# Patient Record
Sex: Male | Born: 1940 | Race: White | Hispanic: No | Marital: Married | Smoking: Former smoker
Health system: Southern US, Community
[De-identification: ages and names within clinical notes are randomized; demographics above are authoritative.]

## PROBLEM LIST (undated history)

## (undated) DIAGNOSIS — H269 Unspecified cataract: Secondary | ICD-10-CM

## (undated) DIAGNOSIS — I739 Peripheral vascular disease, unspecified: Secondary | ICD-10-CM

## (undated) DIAGNOSIS — I2510 Atherosclerotic heart disease of native coronary artery without angina pectoris: Secondary | ICD-10-CM

## (undated) DIAGNOSIS — E7800 Pure hypercholesterolemia, unspecified: Secondary | ICD-10-CM

## (undated) DIAGNOSIS — E785 Hyperlipidemia, unspecified: Secondary | ICD-10-CM

## (undated) DIAGNOSIS — F419 Anxiety disorder, unspecified: Secondary | ICD-10-CM

## (undated) DIAGNOSIS — I420 Dilated cardiomyopathy: Secondary | ICD-10-CM

## (undated) DIAGNOSIS — I509 Heart failure, unspecified: Secondary | ICD-10-CM

## (undated) DIAGNOSIS — I10 Essential (primary) hypertension: Secondary | ICD-10-CM

## (undated) DIAGNOSIS — G4710 Hypersomnia, unspecified: Secondary | ICD-10-CM

## (undated) DIAGNOSIS — F32A Depression, unspecified: Secondary | ICD-10-CM

## (undated) DIAGNOSIS — J449 Chronic obstructive pulmonary disease, unspecified: Secondary | ICD-10-CM

## (undated) DIAGNOSIS — H3530 Unspecified macular degeneration: Secondary | ICD-10-CM

## (undated) DIAGNOSIS — F329 Major depressive disorder, single episode, unspecified: Secondary | ICD-10-CM

## (undated) DIAGNOSIS — N189 Chronic kidney disease, unspecified: Secondary | ICD-10-CM

## (undated) DIAGNOSIS — N200 Calculus of kidney: Secondary | ICD-10-CM

## (undated) DIAGNOSIS — I255 Ischemic cardiomyopathy: Secondary | ICD-10-CM

## (undated) DIAGNOSIS — E119 Type 2 diabetes mellitus without complications: Secondary | ICD-10-CM

## (undated) HISTORY — DX: Hyperlipidemia, unspecified: E78.5

## (undated) HISTORY — DX: Unspecified cataract: H26.9

## (undated) HISTORY — DX: Peripheral vascular disease, unspecified: I73.9

## (undated) HISTORY — DX: Pure hypercholesterolemia, unspecified: E78.00

## (undated) HISTORY — PX: CORONARY ARTERY BYPASS GRAFT: SHX141

## (undated) HISTORY — DX: Hypersomnia, unspecified: G47.10

## (undated) HISTORY — DX: Major depressive disorder, single episode, unspecified: F32.9

## (undated) HISTORY — DX: Dilated cardiomyopathy: I25.5

## (undated) HISTORY — DX: Calculus of kidney: N20.0

## (undated) HISTORY — PX: OTHER SURGICAL HISTORY: SHX169

## (undated) HISTORY — DX: Essential (primary) hypertension: I10

## (undated) HISTORY — DX: Type 2 diabetes mellitus without complications: E11.9

## (undated) HISTORY — DX: Atherosclerotic heart disease of native coronary artery without angina pectoris: I25.10

## (undated) HISTORY — DX: Dilated cardiomyopathy: I42.0

## (undated) HISTORY — DX: Chronic kidney disease, unspecified: N18.9

## (undated) HISTORY — DX: Depression, unspecified: F32.A

## (undated) HISTORY — DX: Anxiety disorder, unspecified: F41.9

---

## 2001-06-09 ENCOUNTER — Inpatient Hospital Stay (HOSPITAL_COMMUNITY): Admission: EM | Admit: 2001-06-09 | Discharge: 2001-06-16 | Payer: Self-pay | Admitting: Emergency Medicine

## 2001-06-11 ENCOUNTER — Encounter: Payer: Self-pay | Admitting: Thoracic Surgery (Cardiothoracic Vascular Surgery)

## 2001-06-12 ENCOUNTER — Encounter: Payer: Self-pay | Admitting: Thoracic Surgery (Cardiothoracic Vascular Surgery)

## 2001-06-15 ENCOUNTER — Encounter: Payer: Self-pay | Admitting: Thoracic Surgery (Cardiothoracic Vascular Surgery)

## 2001-07-21 ENCOUNTER — Encounter (HOSPITAL_COMMUNITY): Admission: RE | Admit: 2001-07-21 | Discharge: 2001-08-14 | Payer: Self-pay | Admitting: Cardiology

## 2012-12-29 ENCOUNTER — Encounter (INDEPENDENT_AMBULATORY_CARE_PROVIDER_SITE_OTHER): Payer: Medicare Other | Admitting: Ophthalmology

## 2012-12-29 DIAGNOSIS — I10 Essential (primary) hypertension: Secondary | ICD-10-CM

## 2012-12-29 DIAGNOSIS — H353290 Exudative age-related macular degeneration, unspecified eye, stage unspecified: Secondary | ICD-10-CM

## 2012-12-29 DIAGNOSIS — H3530 Unspecified macular degeneration: Secondary | ICD-10-CM

## 2012-12-29 DIAGNOSIS — H43819 Vitreous degeneration, unspecified eye: Secondary | ICD-10-CM

## 2013-01-11 ENCOUNTER — Encounter (INDEPENDENT_AMBULATORY_CARE_PROVIDER_SITE_OTHER): Payer: Medicare Other | Admitting: Ophthalmology

## 2013-01-11 DIAGNOSIS — H35039 Hypertensive retinopathy, unspecified eye: Secondary | ICD-10-CM

## 2013-01-11 DIAGNOSIS — H3530 Unspecified macular degeneration: Secondary | ICD-10-CM

## 2013-01-11 DIAGNOSIS — I10 Essential (primary) hypertension: Secondary | ICD-10-CM

## 2013-01-11 DIAGNOSIS — H353290 Exudative age-related macular degeneration, unspecified eye, stage unspecified: Secondary | ICD-10-CM

## 2013-01-25 ENCOUNTER — Encounter (INDEPENDENT_AMBULATORY_CARE_PROVIDER_SITE_OTHER): Payer: Medicare Other | Admitting: Ophthalmology

## 2013-01-25 DIAGNOSIS — H3530 Unspecified macular degeneration: Secondary | ICD-10-CM

## 2013-01-25 DIAGNOSIS — I10 Essential (primary) hypertension: Secondary | ICD-10-CM

## 2013-01-25 DIAGNOSIS — H35039 Hypertensive retinopathy, unspecified eye: Secondary | ICD-10-CM

## 2013-01-25 DIAGNOSIS — H43819 Vitreous degeneration, unspecified eye: Secondary | ICD-10-CM

## 2013-01-25 DIAGNOSIS — H353290 Exudative age-related macular degeneration, unspecified eye, stage unspecified: Secondary | ICD-10-CM

## 2013-03-01 ENCOUNTER — Encounter (INDEPENDENT_AMBULATORY_CARE_PROVIDER_SITE_OTHER): Payer: Medicare Other | Admitting: Ophthalmology

## 2013-03-01 DIAGNOSIS — H353290 Exudative age-related macular degeneration, unspecified eye, stage unspecified: Secondary | ICD-10-CM

## 2013-03-01 DIAGNOSIS — H35039 Hypertensive retinopathy, unspecified eye: Secondary | ICD-10-CM

## 2013-03-01 DIAGNOSIS — H43819 Vitreous degeneration, unspecified eye: Secondary | ICD-10-CM

## 2013-03-01 DIAGNOSIS — I10 Essential (primary) hypertension: Secondary | ICD-10-CM

## 2013-03-01 DIAGNOSIS — H3530 Unspecified macular degeneration: Secondary | ICD-10-CM

## 2013-04-14 ENCOUNTER — Encounter (INDEPENDENT_AMBULATORY_CARE_PROVIDER_SITE_OTHER): Payer: Medicare Other | Admitting: Ophthalmology

## 2013-04-14 DIAGNOSIS — H353290 Exudative age-related macular degeneration, unspecified eye, stage unspecified: Secondary | ICD-10-CM

## 2013-04-14 DIAGNOSIS — I10 Essential (primary) hypertension: Secondary | ICD-10-CM

## 2013-04-14 DIAGNOSIS — H3530 Unspecified macular degeneration: Secondary | ICD-10-CM

## 2013-04-14 DIAGNOSIS — H43819 Vitreous degeneration, unspecified eye: Secondary | ICD-10-CM

## 2013-04-14 DIAGNOSIS — H35039 Hypertensive retinopathy, unspecified eye: Secondary | ICD-10-CM

## 2013-05-25 ENCOUNTER — Encounter (INDEPENDENT_AMBULATORY_CARE_PROVIDER_SITE_OTHER): Payer: Medicare Other | Admitting: Ophthalmology

## 2013-05-25 DIAGNOSIS — I10 Essential (primary) hypertension: Secondary | ICD-10-CM

## 2013-05-25 DIAGNOSIS — H3530 Unspecified macular degeneration: Secondary | ICD-10-CM

## 2013-05-25 DIAGNOSIS — H35039 Hypertensive retinopathy, unspecified eye: Secondary | ICD-10-CM

## 2013-05-25 DIAGNOSIS — H353290 Exudative age-related macular degeneration, unspecified eye, stage unspecified: Secondary | ICD-10-CM

## 2013-05-25 DIAGNOSIS — H43819 Vitreous degeneration, unspecified eye: Secondary | ICD-10-CM

## 2013-06-15 ENCOUNTER — Other Ambulatory Visit (INDEPENDENT_AMBULATORY_CARE_PROVIDER_SITE_OTHER): Payer: Medicare Other | Admitting: Ophthalmology

## 2013-06-15 DIAGNOSIS — H26499 Other secondary cataract, unspecified eye: Secondary | ICD-10-CM

## 2013-07-06 ENCOUNTER — Encounter (INDEPENDENT_AMBULATORY_CARE_PROVIDER_SITE_OTHER): Payer: Medicare Other | Admitting: Ophthalmology

## 2013-08-03 ENCOUNTER — Encounter (INDEPENDENT_AMBULATORY_CARE_PROVIDER_SITE_OTHER): Payer: Medicare Other | Admitting: Ophthalmology

## 2013-08-03 DIAGNOSIS — I10 Essential (primary) hypertension: Secondary | ICD-10-CM

## 2013-08-03 DIAGNOSIS — H353290 Exudative age-related macular degeneration, unspecified eye, stage unspecified: Secondary | ICD-10-CM

## 2013-08-03 DIAGNOSIS — H35039 Hypertensive retinopathy, unspecified eye: Secondary | ICD-10-CM

## 2013-08-03 DIAGNOSIS — H43819 Vitreous degeneration, unspecified eye: Secondary | ICD-10-CM

## 2013-09-07 ENCOUNTER — Encounter (INDEPENDENT_AMBULATORY_CARE_PROVIDER_SITE_OTHER): Payer: Medicare Other | Admitting: Ophthalmology

## 2013-09-07 DIAGNOSIS — H353290 Exudative age-related macular degeneration, unspecified eye, stage unspecified: Secondary | ICD-10-CM

## 2013-09-07 DIAGNOSIS — I10 Essential (primary) hypertension: Secondary | ICD-10-CM

## 2013-09-07 DIAGNOSIS — H35039 Hypertensive retinopathy, unspecified eye: Secondary | ICD-10-CM

## 2013-09-07 DIAGNOSIS — H43819 Vitreous degeneration, unspecified eye: Secondary | ICD-10-CM

## 2013-10-13 ENCOUNTER — Other Ambulatory Visit: Payer: Self-pay | Admitting: Cardiology

## 2013-10-19 ENCOUNTER — Encounter (INDEPENDENT_AMBULATORY_CARE_PROVIDER_SITE_OTHER): Payer: Medicare Other | Admitting: Ophthalmology

## 2013-10-19 DIAGNOSIS — I10 Essential (primary) hypertension: Secondary | ICD-10-CM

## 2013-10-19 DIAGNOSIS — H43819 Vitreous degeneration, unspecified eye: Secondary | ICD-10-CM

## 2013-10-19 DIAGNOSIS — H353290 Exudative age-related macular degeneration, unspecified eye, stage unspecified: Secondary | ICD-10-CM

## 2013-10-19 DIAGNOSIS — H35039 Hypertensive retinopathy, unspecified eye: Secondary | ICD-10-CM

## 2013-10-23 ENCOUNTER — Encounter: Payer: Self-pay | Admitting: Cardiology

## 2013-10-23 ENCOUNTER — Encounter: Payer: Self-pay | Admitting: *Deleted

## 2013-10-23 DIAGNOSIS — F419 Anxiety disorder, unspecified: Secondary | ICD-10-CM | POA: Insufficient documentation

## 2013-10-23 DIAGNOSIS — I2510 Atherosclerotic heart disease of native coronary artery without angina pectoris: Secondary | ICD-10-CM | POA: Insufficient documentation

## 2013-10-23 DIAGNOSIS — E785 Hyperlipidemia, unspecified: Secondary | ICD-10-CM | POA: Insufficient documentation

## 2013-10-23 DIAGNOSIS — G4710 Hypersomnia, unspecified: Secondary | ICD-10-CM | POA: Insufficient documentation

## 2013-10-23 DIAGNOSIS — F329 Major depressive disorder, single episode, unspecified: Secondary | ICD-10-CM | POA: Insufficient documentation

## 2013-10-25 ENCOUNTER — Ambulatory Visit (INDEPENDENT_AMBULATORY_CARE_PROVIDER_SITE_OTHER): Payer: Medicare Other | Admitting: Cardiology

## 2013-10-25 ENCOUNTER — Encounter: Payer: Self-pay | Admitting: Cardiology

## 2013-10-25 VITALS — BP 139/80 | HR 92 | Wt 254.0 lb

## 2013-10-25 DIAGNOSIS — I739 Peripheral vascular disease, unspecified: Secondary | ICD-10-CM | POA: Insufficient documentation

## 2013-10-25 DIAGNOSIS — I2589 Other forms of chronic ischemic heart disease: Secondary | ICD-10-CM

## 2013-10-25 DIAGNOSIS — I2510 Atherosclerotic heart disease of native coronary artery without angina pectoris: Secondary | ICD-10-CM

## 2013-10-25 DIAGNOSIS — E785 Hyperlipidemia, unspecified: Secondary | ICD-10-CM

## 2013-10-25 DIAGNOSIS — I255 Ischemic cardiomyopathy: Secondary | ICD-10-CM | POA: Insufficient documentation

## 2013-10-25 DIAGNOSIS — N200 Calculus of kidney: Secondary | ICD-10-CM | POA: Insufficient documentation

## 2013-10-25 DIAGNOSIS — I10 Essential (primary) hypertension: Secondary | ICD-10-CM | POA: Insufficient documentation

## 2013-10-25 LAB — LDL CHOLESTEROL, DIRECT: Direct LDL: 169.5 mg/dL

## 2013-10-25 LAB — HEPATIC FUNCTION PANEL
ALT: 24 U/L (ref 0–53)
AST: 20 U/L (ref 0–37)
Albumin: 4 g/dL (ref 3.5–5.2)
Alkaline Phosphatase: 67 U/L (ref 39–117)
Bilirubin, Direct: 0 mg/dL (ref 0.0–0.3)
Total Bilirubin: 0.6 mg/dL (ref 0.3–1.2)
Total Protein: 7.1 g/dL (ref 6.0–8.3)

## 2013-10-25 LAB — LIPID PANEL
Cholesterol: 229 mg/dL — ABNORMAL HIGH (ref 0–200)
HDL: 52.6 mg/dL (ref >39.00)
Total CHOL/HDL Ratio: 4
Triglycerides: 115 mg/dL (ref 0.0–149.0)
VLDL: 23 mg/dL (ref 0.0–40.0)

## 2013-10-25 NOTE — Progress Notes (Signed)
[address], [address] [place], [place]  [address] Phone: [phone] Fax:  [phone]  Date:  10/25/2013   ID:  [name], DOB 02-Aug-1941, MRN [id number]  PCP:  No primary provider on file.  Cardiologist:  Armanda Magic, MD     History of Present Illness: [name] is a 72 y.o. male with a history of ASCAD/HTN/dyslipdiemia.  He is doing well.  He denies any chest pain, SOB at rest, LE edema, dizziness, palpitations or syncope.  He had a nuclear stress test a year ago due to SOB which showed no ischemia with prior infarct.  He has chronic DOE going up inclines but none when walking on flat ground.     Wt Readings from Last 3 Encounters:  10/25/13 254 lb (115.214 kg)     Past Medical History  Diagnosis Date  . Depression   . Anxiety   . Hypersomnia   . Dyslipidemia   . Coronary atherosclerosis of native coronary artery 2002    s/p MI with Ischemic CM EF 40% with apical akinesis at cath 2002 intolerant to ACE inhibitors and ARBs - EF 43% by nuclear stress test 08/2010 Dr. Mayford Knife   . CAD (coronary artery disease)      s/p CABG with LIMA to LAD, SVG to diag, SVG to OM, SVG to RCA cath 2002 Dr. Mayford Knife  . Ischemic dilated cardiomyopathy     EF 40% with apical AK at cath intolerant to ACE I and ARBS, EF 43% by nuclear stress test 08/2010  . Hypertension   . Hypercholesteremia   . Claudication     normal ABIs  . Cataract   . Chronic kidney disease   . Kidney stones     Current Outpatient Prescriptions  Medication Sig Dispense Refill  . amLODipine (NORVASC) 10 MG tablet TAKE 1 TABLET EVERY DAY  30 tablet  6  . escitalopram (LEXAPRO) 10 MG tablet Take 1 tablet by mouth daily.      . metoprolol succinate (TOPROL-XL) 25 MG 24 hr tablet Take 1 tablet by mouth daily.       No current facility-administered medications for this visit.    Allergies:   No Known Allergies  Social History:  The patient  reports that he quit smoking about 12 years ago. He does not have any  smokeless tobacco history on file. He reports that he drinks alcohol. He reports that he does not use illicit drugs.   Family History:  The patient's family history includes Breast cancer in his father and sister; CAD in his sister; CVA in his father; Diabetes in his brother, mother, sister, and sister; Heart disease in his brother and sister; Hypertension in his father, mother, sister, and sister.   ROS:  Please see the history of present illness.      All other systems reviewed and negative.   PHYSICAL EXAM: VS:  BP 139/80  Pulse 92  Wt 254 lb (115.214 kg) Well nourished, well developed, in no acute distress HEENT: normal Neck: no JVD Cardiac:  normal S1, S2; RRR; no murmur Lungs:  clear to auscultation bilaterally, no wheezing, rhonchi or rales Abd: soft, nontender, no hepatomegaly Ext: no edema Skin: warm and dry Neuro:  CNs 2-12 intact, no focal abnormalities noted  EKG:  NSR with nonspecific T wave abnormality with no change from EKG done 2013     ASSESSMENT AND PLAN:  1. ASCAD with no angina  - continue ASA 2. HTN  - continue  amlodipine/Metoprolol 3. Dyslipidemia - lipids from 02/2013 were not at goal of LDL<70.  His LDL was 98.  He just finished a drug study in May and is currently off statins.  - recheck fasting lipid panel 4. Ischemic DCM with no CHF on beta blockers.  He is intolerant to ACEI and ARBs  Followup with me in 6 months  Signed, Armanda Magic, MD 10/25/2013 11:30 AM

## 2013-10-25 NOTE — Patient Instructions (Signed)
Your physician recommends that you continue on your current medications as directed. Please refer to the Current Medication list given to you today.  Your physician recommends that you go to the lab today to have fasting Lipid and Hepatic panel drawn  Your physician wants you to follow-up in: 6 Months with Dr. Sherlyn Lick will receive a reminder letter in the mail two months in advance. If you don't receive a letter, please call our office to schedule the follow-up appointment.

## 2013-10-29 ENCOUNTER — Telehealth: Payer: Self-pay | Admitting: Pharmacist

## 2013-10-29 DIAGNOSIS — E785 Hyperlipidemia, unspecified: Secondary | ICD-10-CM

## 2013-10-29 DIAGNOSIS — Z79899 Other long term (current) drug therapy: Secondary | ICD-10-CM

## 2013-10-29 MED ORDER — SIMVASTATIN 20 MG PO TABS: 20.0000 mg | ORAL_TABLET | Freq: Every day | ORAL | Status: DC

## 2013-10-29 NOTE — Telephone Encounter (Signed)
RF:  CAD, HTN, age - LDL goal < 70 Meds:  Simvastatin 20 mg qhs - patient has been off simvastatin for past month he tells me as he ran out of refills.  He was tolerating it well though. Intolerant:  Lipitor (myalgias), Crestor (myalgais), simvastatin (mild myalgias).   Can't afford - Zetia. Patient was in a [employer] inhibitor study which finished 04/2013 of this year, and was to take simvastatin 20 mg daily since then.   His LDL was ~ 95 mg/dL in 12/6107, and LDL was also ~ 90-100 mg/dL in 6045 prior to enrolling into study.  It appears he was on placebo arm, and thus simvastatin 20 mg should be able to achieve LDL < 100 mg/dL on its own anyway.  He is on amlodipine as well, so limited to only simvastatin 20 mg due to interaction.  Given Zetia and Welchol will be too expensive will take only simvastatin 20 mg qd and recheck panel in 6 months.  If another study is enrolling in 6 months, could always consider enrolling into this if possible. Plan: 1.  Restart Simvastatin 20 mg qd. 2.  Recheck lipid and liver panel in 6 months (04/27/13) as he historically took this dose with no problems.  Patient notified and voices verbal understanding.

## 2013-11-16 ENCOUNTER — Encounter (INDEPENDENT_AMBULATORY_CARE_PROVIDER_SITE_OTHER): Payer: Medicare Other | Admitting: Ophthalmology

## 2013-11-16 DIAGNOSIS — H35039 Hypertensive retinopathy, unspecified eye: Secondary | ICD-10-CM

## 2013-11-16 DIAGNOSIS — H3530 Unspecified macular degeneration: Secondary | ICD-10-CM

## 2013-11-16 DIAGNOSIS — H43819 Vitreous degeneration, unspecified eye: Secondary | ICD-10-CM

## 2013-11-16 DIAGNOSIS — H353290 Exudative age-related macular degeneration, unspecified eye, stage unspecified: Secondary | ICD-10-CM

## 2013-11-16 DIAGNOSIS — I10 Essential (primary) hypertension: Secondary | ICD-10-CM

## 2013-12-14 ENCOUNTER — Encounter (INDEPENDENT_AMBULATORY_CARE_PROVIDER_SITE_OTHER): Payer: Medicare Other | Admitting: Ophthalmology

## 2013-12-14 DIAGNOSIS — H353290 Exudative age-related macular degeneration, unspecified eye, stage unspecified: Secondary | ICD-10-CM

## 2013-12-14 DIAGNOSIS — H35039 Hypertensive retinopathy, unspecified eye: Secondary | ICD-10-CM

## 2013-12-14 DIAGNOSIS — I10 Essential (primary) hypertension: Secondary | ICD-10-CM

## 2013-12-14 DIAGNOSIS — H3530 Unspecified macular degeneration: Secondary | ICD-10-CM

## 2013-12-14 DIAGNOSIS — H43819 Vitreous degeneration, unspecified eye: Secondary | ICD-10-CM

## 2014-01-21 ENCOUNTER — Other Ambulatory Visit: Payer: Self-pay

## 2014-01-21 MED ORDER — METOPROLOL SUCCINATE ER 25 MG PO TB24: 25.0000 mg | ORAL_TABLET | Freq: Every day | ORAL | Status: DC

## 2014-01-25 ENCOUNTER — Encounter (INDEPENDENT_AMBULATORY_CARE_PROVIDER_SITE_OTHER): Payer: Medicare Other | Admitting: Ophthalmology

## 2014-01-25 DIAGNOSIS — H353290 Exudative age-related macular degeneration, unspecified eye, stage unspecified: Secondary | ICD-10-CM

## 2014-01-25 DIAGNOSIS — H35039 Hypertensive retinopathy, unspecified eye: Secondary | ICD-10-CM

## 2014-01-25 DIAGNOSIS — H3530 Unspecified macular degeneration: Secondary | ICD-10-CM

## 2014-01-25 DIAGNOSIS — I10 Essential (primary) hypertension: Secondary | ICD-10-CM

## 2014-03-01 ENCOUNTER — Encounter (INDEPENDENT_AMBULATORY_CARE_PROVIDER_SITE_OTHER): Payer: Medicare Other | Admitting: Ophthalmology

## 2014-03-01 DIAGNOSIS — H353290 Exudative age-related macular degeneration, unspecified eye, stage unspecified: Secondary | ICD-10-CM

## 2014-03-01 DIAGNOSIS — I10 Essential (primary) hypertension: Secondary | ICD-10-CM

## 2014-03-01 DIAGNOSIS — H43819 Vitreous degeneration, unspecified eye: Secondary | ICD-10-CM

## 2014-03-01 DIAGNOSIS — H35039 Hypertensive retinopathy, unspecified eye: Secondary | ICD-10-CM

## 2014-04-05 ENCOUNTER — Encounter (INDEPENDENT_AMBULATORY_CARE_PROVIDER_SITE_OTHER): Payer: Medicare Other | Admitting: Ophthalmology

## 2014-04-05 DIAGNOSIS — H353290 Exudative age-related macular degeneration, unspecified eye, stage unspecified: Secondary | ICD-10-CM

## 2014-04-05 DIAGNOSIS — I10 Essential (primary) hypertension: Secondary | ICD-10-CM

## 2014-04-05 DIAGNOSIS — H43819 Vitreous degeneration, unspecified eye: Secondary | ICD-10-CM

## 2014-04-05 DIAGNOSIS — H35039 Hypertensive retinopathy, unspecified eye: Secondary | ICD-10-CM

## 2014-04-27 ENCOUNTER — Other Ambulatory Visit (INDEPENDENT_AMBULATORY_CARE_PROVIDER_SITE_OTHER): Payer: Medicare Other

## 2014-04-27 DIAGNOSIS — E785 Hyperlipidemia, unspecified: Secondary | ICD-10-CM

## 2014-04-27 DIAGNOSIS — Z79899 Other long term (current) drug therapy: Secondary | ICD-10-CM

## 2014-04-27 LAB — LIPID PANEL
Cholesterol: 178 mg/dL (ref 0–200)
HDL: 51.6 mg/dL (ref >39.00)
LDL Cholesterol: 109 mg/dL — ABNORMAL HIGH (ref 0–99)
Total CHOL/HDL Ratio: 3
Triglycerides: 85 mg/dL (ref 0.0–149.0)
VLDL: 17 mg/dL (ref 0.0–40.0)

## 2014-04-27 LAB — HEPATIC FUNCTION PANEL
ALT: 24 U/L (ref 0–53)
AST: 26 U/L (ref 0–37)
Albumin: 4.1 g/dL (ref 3.5–5.2)
Alkaline Phosphatase: 58 U/L (ref 39–117)
Bilirubin, Direct: 0.1 mg/dL (ref 0.0–0.3)
TOTAL PROTEIN: 6.9 g/dL (ref 6.0–8.3)
Total Bilirubin: 0.9 mg/dL (ref 0.2–1.2)

## 2014-04-28 ENCOUNTER — Encounter: Payer: Self-pay | Admitting: General Surgery

## 2014-04-28 ENCOUNTER — Other Ambulatory Visit: Payer: Self-pay | Admitting: General Surgery

## 2014-04-28 DIAGNOSIS — E785 Hyperlipidemia, unspecified: Secondary | ICD-10-CM

## 2014-05-03 ENCOUNTER — Encounter (INDEPENDENT_AMBULATORY_CARE_PROVIDER_SITE_OTHER): Payer: Medicare Other | Admitting: Ophthalmology

## 2014-05-03 DIAGNOSIS — I10 Essential (primary) hypertension: Secondary | ICD-10-CM

## 2014-05-03 DIAGNOSIS — H353290 Exudative age-related macular degeneration, unspecified eye, stage unspecified: Secondary | ICD-10-CM

## 2014-05-03 DIAGNOSIS — H35039 Hypertensive retinopathy, unspecified eye: Secondary | ICD-10-CM

## 2014-05-03 DIAGNOSIS — H43819 Vitreous degeneration, unspecified eye: Secondary | ICD-10-CM

## 2014-05-17 ENCOUNTER — Other Ambulatory Visit: Payer: Self-pay | Admitting: *Deleted

## 2014-05-17 MED ORDER — AMLODIPINE BESYLATE 10 MG PO TABS: ORAL_TABLET | ORAL | Status: DC

## 2014-05-31 ENCOUNTER — Encounter (INDEPENDENT_AMBULATORY_CARE_PROVIDER_SITE_OTHER): Payer: Medicare Other | Admitting: Ophthalmology

## 2014-05-31 DIAGNOSIS — I10 Essential (primary) hypertension: Secondary | ICD-10-CM

## 2014-05-31 DIAGNOSIS — H43819 Vitreous degeneration, unspecified eye: Secondary | ICD-10-CM

## 2014-05-31 DIAGNOSIS — H35039 Hypertensive retinopathy, unspecified eye: Secondary | ICD-10-CM

## 2014-05-31 DIAGNOSIS — H353290 Exudative age-related macular degeneration, unspecified eye, stage unspecified: Secondary | ICD-10-CM

## 2014-06-13 ENCOUNTER — Other Ambulatory Visit: Payer: Self-pay | Admitting: Cardiology

## 2014-06-28 ENCOUNTER — Encounter (INDEPENDENT_AMBULATORY_CARE_PROVIDER_SITE_OTHER): Payer: Medicare Other | Admitting: Ophthalmology

## 2014-06-28 DIAGNOSIS — H35039 Hypertensive retinopathy, unspecified eye: Secondary | ICD-10-CM

## 2014-06-28 DIAGNOSIS — H353290 Exudative age-related macular degeneration, unspecified eye, stage unspecified: Secondary | ICD-10-CM

## 2014-06-28 DIAGNOSIS — H43819 Vitreous degeneration, unspecified eye: Secondary | ICD-10-CM

## 2014-06-28 DIAGNOSIS — I10 Essential (primary) hypertension: Secondary | ICD-10-CM

## 2014-07-14 ENCOUNTER — Other Ambulatory Visit: Payer: Self-pay | Admitting: Cardiology

## 2014-07-27 ENCOUNTER — Encounter (INDEPENDENT_AMBULATORY_CARE_PROVIDER_SITE_OTHER): Payer: Medicare Other | Admitting: Ophthalmology

## 2014-07-27 DIAGNOSIS — H353290 Exudative age-related macular degeneration, unspecified eye, stage unspecified: Secondary | ICD-10-CM

## 2014-07-27 DIAGNOSIS — I10 Essential (primary) hypertension: Secondary | ICD-10-CM

## 2014-07-27 DIAGNOSIS — H43819 Vitreous degeneration, unspecified eye: Secondary | ICD-10-CM

## 2014-07-27 DIAGNOSIS — H35039 Hypertensive retinopathy, unspecified eye: Secondary | ICD-10-CM

## 2014-08-16 ENCOUNTER — Other Ambulatory Visit: Payer: Self-pay | Admitting: Cardiology

## 2014-08-23 ENCOUNTER — Encounter (INDEPENDENT_AMBULATORY_CARE_PROVIDER_SITE_OTHER): Payer: Medicare Other | Admitting: Ophthalmology

## 2014-08-23 DIAGNOSIS — I10 Essential (primary) hypertension: Secondary | ICD-10-CM

## 2014-08-23 DIAGNOSIS — H43819 Vitreous degeneration, unspecified eye: Secondary | ICD-10-CM

## 2014-08-23 DIAGNOSIS — H35039 Hypertensive retinopathy, unspecified eye: Secondary | ICD-10-CM

## 2014-08-23 DIAGNOSIS — H353290 Exudative age-related macular degeneration, unspecified eye, stage unspecified: Secondary | ICD-10-CM

## 2014-09-15 ENCOUNTER — Other Ambulatory Visit: Payer: Self-pay | Admitting: Cardiology

## 2014-09-16 ENCOUNTER — Ambulatory Visit
Admission: RE | Admit: 2014-09-16 | Discharge: 2014-09-16 | Disposition: A | Discharge status: Home / Self Care | Payer: Medicare Other | Source: Ambulatory Visit | Attending: Nurse Practitioner | Admitting: Nurse Practitioner

## 2014-09-16 ENCOUNTER — Other Ambulatory Visit: Payer: Self-pay | Admitting: Nurse Practitioner

## 2014-09-16 ENCOUNTER — Encounter (INDEPENDENT_AMBULATORY_CARE_PROVIDER_SITE_OTHER): Payer: Self-pay

## 2014-09-16 DIAGNOSIS — R0602 Shortness of breath: Secondary | ICD-10-CM

## 2014-09-20 ENCOUNTER — Encounter (INDEPENDENT_AMBULATORY_CARE_PROVIDER_SITE_OTHER): Payer: Medicare Other | Admitting: Ophthalmology

## 2014-09-20 DIAGNOSIS — H35033 Hypertensive retinopathy, bilateral: Secondary | ICD-10-CM

## 2014-09-20 DIAGNOSIS — H43813 Vitreous degeneration, bilateral: Secondary | ICD-10-CM

## 2014-09-20 DIAGNOSIS — H3532 Exudative age-related macular degeneration: Secondary | ICD-10-CM

## 2014-10-04 ENCOUNTER — Encounter: Payer: Medicare Other | Attending: Internal Medicine

## 2014-10-04 VITALS — Ht 67.5 in | Wt 253.0 lb

## 2014-10-04 DIAGNOSIS — E119 Type 2 diabetes mellitus without complications: Secondary | ICD-10-CM | POA: Insufficient documentation

## 2014-10-04 DIAGNOSIS — Z713 Dietary counseling and surveillance: Secondary | ICD-10-CM | POA: Diagnosis not present

## 2014-10-04 NOTE — Progress Notes (Signed)
Patient was seen on 10/04/14 for the first of a series of three diabetes self-management courses at the [facility].  Patient Education Plan per assessed needs and concerns is to attend four course education program for Diabetes Self Management Education.  The following learning objectives were met by the patient during this class:  Describe diabetes  State some common risk factors for diabetes  Defines the role of glucose and insulin  Identifies type of diabetes and pathophysiology  Describe the relationship between diabetes and cardiovascular risk  State the members of the Healthcare Team  States the rationale for glucose monitoring  State when to test glucose  State their individual Target Range  State the importance of logging glucose readings  Describe how to interpret glucose readings  Identifies A1C target  Explain the correlation between A1c and eAG values  State symptoms and treatment of high blood glucose  State symptoms and treatment of low blood glucose  Explain proper technique for glucose testing  Identifies proper sharps disposal  Handouts given during class include:  Living Well with Diabetes book  Carb Counting and Meal Planning book  Meal Plan Card  Carbohydrate guide  Meal planning worksheet  Low Sodium Flavoring Tips  The diabetes portion plate  E0F to eAG Conversion Chart  Diabetes Medications  Diabetes Recommended Care Schedule  Support Group  Diabetes Success Plan  Core Class Satisfaction Survey  Follow-Up Plan:  Attend core 2

## 2014-10-11 DIAGNOSIS — E119 Type 2 diabetes mellitus without complications: Secondary | ICD-10-CM | POA: Diagnosis not present

## 2014-10-12 ENCOUNTER — Other Ambulatory Visit: Payer: Self-pay | Admitting: Cardiology

## 2014-10-12 NOTE — Progress Notes (Signed)

## 2014-10-13 ENCOUNTER — Other Ambulatory Visit: Payer: Self-pay | Admitting: Cardiology

## 2014-10-18 ENCOUNTER — Encounter (INDEPENDENT_AMBULATORY_CARE_PROVIDER_SITE_OTHER): Payer: Medicare Other | Admitting: Ophthalmology

## 2014-10-18 ENCOUNTER — Encounter: Payer: Medicare Other | Attending: Internal Medicine

## 2014-10-18 DIAGNOSIS — H35033 Hypertensive retinopathy, bilateral: Secondary | ICD-10-CM

## 2014-10-18 DIAGNOSIS — Z713 Dietary counseling and surveillance: Secondary | ICD-10-CM | POA: Insufficient documentation

## 2014-10-18 DIAGNOSIS — E119 Type 2 diabetes mellitus without complications: Secondary | ICD-10-CM | POA: Insufficient documentation

## 2014-10-18 DIAGNOSIS — H43813 Vitreous degeneration, bilateral: Secondary | ICD-10-CM

## 2014-10-18 DIAGNOSIS — I10 Essential (primary) hypertension: Secondary | ICD-10-CM

## 2014-10-18 DIAGNOSIS — H3532 Exudative age-related macular degeneration: Secondary | ICD-10-CM

## 2014-10-18 NOTE — Progress Notes (Signed)
Patient was seen on 10/18/14 for the third of a series of three diabetes self-management courses at the [facility]. The following learning objectives were met by the patient during this class:  . State the amount of activity recommended for healthy living . Describe activities suitable for individual needs . Identify ways to regularly incorporate activity into daily life . Identify barriers to activity and ways to over come these barriers  Identify diabetes medications being personally used and their primary action for lowering glucose and possible side effects . Describe role of stress on blood glucose and develop strategies to address psychosocial issues . Identify diabetes complications and ways to prevent them  Explain how to manage diabetes during illness . Evaluate success in meeting personal goal . Establish 2-3 goals that they will plan to diligently work on until they return for the  85-monthfollow-up visit  Goals:   I will be active  5 times a week  Your patient has identified these potential barriers to change:  Time  Your patient has identified their diabetes self-care support plan as  Family Support On-line Resources Plan:  Attend Core 4 in 4 months

## 2014-10-28 ENCOUNTER — Other Ambulatory Visit: Payer: Medicare Other

## 2014-10-31 ENCOUNTER — Other Ambulatory Visit: Payer: Medicare Other

## 2014-11-01 ENCOUNTER — Ambulatory Visit: Payer: Medicare Other

## 2014-11-02 ENCOUNTER — Other Ambulatory Visit (INDEPENDENT_AMBULATORY_CARE_PROVIDER_SITE_OTHER): Payer: Medicare Other | Admitting: *Deleted

## 2014-11-02 DIAGNOSIS — E785 Hyperlipidemia, unspecified: Secondary | ICD-10-CM

## 2014-11-02 LAB — HEPATIC FUNCTION PANEL
ALK PHOS: 52 U/L (ref 39–117)
ALT: 20 U/L (ref 0–53)
AST: 19 U/L (ref 0–37)
Albumin: 4.2 g/dL (ref 3.5–5.2)
BILIRUBIN DIRECT: 0.1 mg/dL (ref 0.0–0.3)
Total Bilirubin: 0.8 mg/dL (ref 0.2–1.2)
Total Protein: 7.1 g/dL (ref 6.0–8.3)

## 2014-11-02 LAB — LIPID PANEL
CHOL/HDL RATIO: 4
Cholesterol: 227 mg/dL — ABNORMAL HIGH (ref 0–200)
HDL: 53.4 mg/dL (ref >39.00)
LDL Cholesterol: 160 mg/dL — ABNORMAL HIGH (ref 0–99)
NONHDL: 173.6
Triglycerides: 70 mg/dL (ref 0.0–149.0)
VLDL: 14 mg/dL (ref 0.0–40.0)

## 2014-11-10 ENCOUNTER — Other Ambulatory Visit: Payer: Self-pay | Admitting: Cardiology

## 2014-11-14 ENCOUNTER — Telehealth: Payer: Self-pay

## 2014-11-15 ENCOUNTER — Encounter (INDEPENDENT_AMBULATORY_CARE_PROVIDER_SITE_OTHER): Payer: Medicare Other | Admitting: Ophthalmology

## 2014-11-15 DIAGNOSIS — H3532 Exudative age-related macular degeneration: Secondary | ICD-10-CM

## 2014-11-15 DIAGNOSIS — H43813 Vitreous degeneration, bilateral: Secondary | ICD-10-CM

## 2014-11-15 DIAGNOSIS — I10 Essential (primary) hypertension: Secondary | ICD-10-CM

## 2014-11-15 DIAGNOSIS — E11329 Type 2 diabetes mellitus with mild nonproliferative diabetic retinopathy without macular edema: Secondary | ICD-10-CM

## 2014-11-15 DIAGNOSIS — E11319 Type 2 diabetes mellitus with unspecified diabetic retinopathy without macular edema: Secondary | ICD-10-CM | POA: Diagnosis not present

## 2014-11-15 DIAGNOSIS — H35033 Hypertensive retinopathy, bilateral: Secondary | ICD-10-CM | POA: Diagnosis not present

## 2014-11-15 NOTE — Telephone Encounter (Signed)
error 

## 2014-11-23 ENCOUNTER — Other Ambulatory Visit: Payer: Self-pay | Admitting: Cardiology

## 2014-12-20 ENCOUNTER — Encounter (INDEPENDENT_AMBULATORY_CARE_PROVIDER_SITE_OTHER): Payer: Medicare Other | Admitting: Ophthalmology

## 2014-12-20 ENCOUNTER — Other Ambulatory Visit: Payer: Self-pay | Admitting: Cardiology

## 2014-12-20 DIAGNOSIS — H35033 Hypertensive retinopathy, bilateral: Secondary | ICD-10-CM

## 2014-12-20 DIAGNOSIS — E11319 Type 2 diabetes mellitus with unspecified diabetic retinopathy without macular edema: Secondary | ICD-10-CM | POA: Diagnosis not present

## 2014-12-20 DIAGNOSIS — I10 Essential (primary) hypertension: Secondary | ICD-10-CM | POA: Diagnosis not present

## 2014-12-20 DIAGNOSIS — H3532 Exudative age-related macular degeneration: Secondary | ICD-10-CM | POA: Diagnosis not present

## 2014-12-20 DIAGNOSIS — E11329 Type 2 diabetes mellitus with mild nonproliferative diabetic retinopathy without macular edema: Secondary | ICD-10-CM

## 2014-12-20 DIAGNOSIS — H43813 Vitreous degeneration, bilateral: Secondary | ICD-10-CM

## 2014-12-28 ENCOUNTER — Ambulatory Visit (INDEPENDENT_AMBULATORY_CARE_PROVIDER_SITE_OTHER): Payer: Medicare Other | Admitting: Cardiology

## 2014-12-28 ENCOUNTER — Encounter: Payer: Self-pay | Admitting: Cardiology

## 2014-12-28 VITALS — BP 124/78 | HR 97 | Ht 67.5 in | Wt 250.0 lb

## 2014-12-28 DIAGNOSIS — I420 Dilated cardiomyopathy: Secondary | ICD-10-CM

## 2014-12-28 DIAGNOSIS — I255 Ischemic cardiomyopathy: Secondary | ICD-10-CM

## 2014-12-28 DIAGNOSIS — I10 Essential (primary) hypertension: Secondary | ICD-10-CM

## 2014-12-28 DIAGNOSIS — E785 Hyperlipidemia, unspecified: Secondary | ICD-10-CM

## 2014-12-28 DIAGNOSIS — I2510 Atherosclerotic heart disease of native coronary artery without angina pectoris: Secondary | ICD-10-CM

## 2014-12-28 NOTE — Patient Instructions (Addendum)
Your physician recommends that you return for fasting lab work on Feb. 16.  Your physician recommends that you continue on your current medications as directed. Please refer to the Current Medication list given to you today.  Your physician wants you to follow-up in: 1 year with Dr. Radford Pax. You will receive a reminder letter in the mail two months in advance. If you don't receive a letter, please call our office to schedule the follow-up appointment.

## 2014-12-28 NOTE — Progress Notes (Signed)
[place], [place], [place]  [address] Phone: [phone] Fax:  [phone]  Date:  12/28/2014   ID:  [name], DOB Jan 25, 1941, MRN [id number]  PCP:  Kandice Hams, MD  Cardiologist:  Fransico Him, MD    History of Present Illness: [name] is a 74 y.o. male with a history of ASCAD/HTN/dyslipdiemia. He is doing well. He denies any chest pain, SOB at rest, LE edema, dizziness, palpitations or syncope. He has chronic DOE going up inclines but none when walking on flat ground.   Wt Readings from Last 3 Encounters:  12/28/14 250 lb (113.399 kg)  10/04/14 253 lb (114.76 kg)  10/25/13 254 lb (115.214 kg)     Past Medical History  Diagnosis Date  . Depression   . Anxiety   . Hypersomnia   . Dyslipidemia   . Coronary atherosclerosis of native coronary artery 2002    s/p MI with Ischemic CM EF 40% with apical akinesis at cath 2002 intolerant to ACE inhibitors and ARBs - EF 47% by nuclear stress test 08/2010 Dr. Radford Pax   . CAD (coronary artery disease)      s/p CABG with LIMA to LAD, SVG to diag, SVG to OM, SVG to RCA cath 2002 Dr. Radford Pax  . Ischemic dilated cardiomyopathy     EF 40% with apical AK at cath intolerant to ACE I and ARBS, EF 43% by nuclear stress test 08/2010  . Hypercholesteremia   . Claudication     normal ABIs  . Cataract   . Chronic kidney disease   . Kidney stones   . Hypertension   . Diabetes mellitus without complication     Current Outpatient Prescriptions  Medication Sig Dispense Refill  . amLODipine (NORVASC) 10 MG tablet TAKE 1 TABLET BY MOUTH ONCE DAILY **NEEDS APPOINTMENT** 30 tablet 0  . aspirin EC 81 MG tablet Take 81 mg by mouth daily.    Marland Kitchen escitalopram (LEXAPRO) 10 MG tablet Take 1 tablet by mouth daily.    . metFORMIN (GLUCOPHAGE) 500 MG tablet Take by mouth 2 (two) times daily with a meal.    . metoprolol succinate (TOPROL-XL) 25 MG 24 hr tablet Take 1 tablet (25 mg total) by mouth daily. 30 tablet 6  . Multiple  Vitamin (MULTIVITAMIN) capsule Take 1 capsule by mouth daily.    Marland Kitchen SIMBRINZA 1-0.2 % SUSP   4  . simvastatin (ZOCOR) 20 MG tablet TAKE 1 TABLET (20 MG TOTAL) BY MOUTH AT BEDTIME. 30 tablet 0   No current facility-administered medications for this visit.    Allergies:   No Known Allergies  Social History:  The patient  reports that he quit smoking about 13 years ago. He does not have any smokeless tobacco history on file. He reports that he drinks alcohol. He reports that he does not use illicit drugs.   Family History:  The patient's family history includes Breast cancer in his father and sister; CAD in his sister; CVA in his father; Diabetes in his brother, mother, sister, and sister; Heart disease in his brother and sister; Hypertension in his father, mother, sister, and sister.   ROS:  Please see the history of present illness.      All other systems reviewed and negative.   PHYSICAL EXAM: VS:  BP 124/78 mmHg  Pulse 97  Ht 5' 7.5" (1.715 m)  Wt 250 lb (113.399 kg)  BMI 38.56 kg/m2 Well nourished, well developed, in no acute distress HEENT: normal  Neck: no JVD Cardiac:  normal S1, S2; RRR; no murmur Lungs:  clear to auscultation bilaterally, no wheezing, rhonchi or rales Abd: soft, nontender, no hepatomegaly Ext: no edema Skin: warm and dry Neuro:  CNs 2-12 intact, no focal abnormalities noted  EKG:   NSR with Lateral T wave abnormality  - no change from 2014  ASSESSMENT AND PLAN:  1. ASCAD with no angina - continue ASA 2. HTN - well controlled - continue amlodipine/Metoprolol   3.   Dyslipidemia - lipids from 10/2014 were not at goal of LDL<70. His LDL was 160but he had run out of his meds.   - recheck fasting lipid panel next month  - continue simvastatin 4. Ischemic DCM with no CHF on beta blockers. He is intolerant to ACEI and ARBs  Followup with me in 1 year  Signed, Fransico Him, MD [facility] 12/28/2014 4:07 PM

## 2014-12-29 ENCOUNTER — Other Ambulatory Visit: Payer: Self-pay | Admitting: Cardiology

## 2015-01-17 ENCOUNTER — Encounter (INDEPENDENT_AMBULATORY_CARE_PROVIDER_SITE_OTHER): Payer: Medicare Other | Admitting: Ophthalmology

## 2015-01-17 ENCOUNTER — Other Ambulatory Visit: Payer: Self-pay | Admitting: Cardiology

## 2015-01-17 DIAGNOSIS — H3532 Exudative age-related macular degeneration: Secondary | ICD-10-CM

## 2015-01-17 DIAGNOSIS — I10 Essential (primary) hypertension: Secondary | ICD-10-CM | POA: Diagnosis not present

## 2015-01-17 DIAGNOSIS — E11319 Type 2 diabetes mellitus with unspecified diabetic retinopathy without macular edema: Secondary | ICD-10-CM

## 2015-01-17 DIAGNOSIS — H43813 Vitreous degeneration, bilateral: Secondary | ICD-10-CM

## 2015-01-17 DIAGNOSIS — E11339 Type 2 diabetes mellitus with moderate nonproliferative diabetic retinopathy without macular edema: Secondary | ICD-10-CM

## 2015-01-17 DIAGNOSIS — H35033 Hypertensive retinopathy, bilateral: Secondary | ICD-10-CM | POA: Diagnosis not present

## 2015-01-17 DIAGNOSIS — E11329 Type 2 diabetes mellitus with mild nonproliferative diabetic retinopathy without macular edema: Secondary | ICD-10-CM

## 2015-01-31 ENCOUNTER — Other Ambulatory Visit: Payer: Medicare Other

## 2015-02-01 ENCOUNTER — Other Ambulatory Visit (INDEPENDENT_AMBULATORY_CARE_PROVIDER_SITE_OTHER): Payer: Medicare Other | Admitting: *Deleted

## 2015-02-01 DIAGNOSIS — E785 Hyperlipidemia, unspecified: Secondary | ICD-10-CM

## 2015-02-01 LAB — LIPID PANEL
Cholesterol: 156 mg/dL (ref 0–200)
HDL: 60.3 mg/dL (ref >39.00)
LDL Cholesterol: 82 mg/dL (ref 0–99)
NonHDL: 95.7
Total CHOL/HDL Ratio: 3
Triglycerides: 71 mg/dL (ref 0.0–149.0)
VLDL: 14.2 mg/dL (ref 0.0–40.0)

## 2015-02-01 LAB — ALT: ALT: 17 U/L (ref 0–53)

## 2015-02-01 NOTE — Addendum Note (Signed)
Addended by: Eulis Foster on: 02/01/2015 01:28 PM   Modules accepted: Orders

## 2015-02-03 ENCOUNTER — Other Ambulatory Visit: Payer: Self-pay | Admitting: Cardiology

## 2015-02-15 ENCOUNTER — Encounter (INDEPENDENT_AMBULATORY_CARE_PROVIDER_SITE_OTHER): Payer: Medicare Other | Admitting: Ophthalmology

## 2015-02-15 DIAGNOSIS — E1139 Type 2 diabetes mellitus with other diabetic ophthalmic complication: Secondary | ICD-10-CM | POA: Diagnosis not present

## 2015-02-15 DIAGNOSIS — I10 Essential (primary) hypertension: Secondary | ICD-10-CM

## 2015-02-15 DIAGNOSIS — H3532 Exudative age-related macular degeneration: Secondary | ICD-10-CM | POA: Diagnosis not present

## 2015-02-15 DIAGNOSIS — H43813 Vitreous degeneration, bilateral: Secondary | ICD-10-CM | POA: Diagnosis not present

## 2015-02-15 DIAGNOSIS — H35033 Hypertensive retinopathy, bilateral: Secondary | ICD-10-CM | POA: Diagnosis not present

## 2015-02-15 DIAGNOSIS — E11319 Type 2 diabetes mellitus with unspecified diabetic retinopathy without macular edema: Secondary | ICD-10-CM

## 2015-02-15 DIAGNOSIS — E11329 Type 2 diabetes mellitus with mild nonproliferative diabetic retinopathy without macular edema: Secondary | ICD-10-CM | POA: Diagnosis not present

## 2015-02-20 ENCOUNTER — Ambulatory Visit: Payer: Medicare Other

## 2015-02-21 ENCOUNTER — Other Ambulatory Visit: Payer: Self-pay | Admitting: Cardiology

## 2015-03-08 ENCOUNTER — Encounter (INDEPENDENT_AMBULATORY_CARE_PROVIDER_SITE_OTHER): Payer: Medicare Other | Admitting: Ophthalmology

## 2015-03-08 DIAGNOSIS — H35033 Hypertensive retinopathy, bilateral: Secondary | ICD-10-CM | POA: Diagnosis not present

## 2015-03-08 DIAGNOSIS — E11329 Type 2 diabetes mellitus with mild nonproliferative diabetic retinopathy without macular edema: Secondary | ICD-10-CM

## 2015-03-08 DIAGNOSIS — I10 Essential (primary) hypertension: Secondary | ICD-10-CM | POA: Diagnosis not present

## 2015-03-08 DIAGNOSIS — H43813 Vitreous degeneration, bilateral: Secondary | ICD-10-CM | POA: Diagnosis not present

## 2015-03-08 DIAGNOSIS — H3532 Exudative age-related macular degeneration: Secondary | ICD-10-CM | POA: Diagnosis not present

## 2015-03-08 DIAGNOSIS — E11319 Type 2 diabetes mellitus with unspecified diabetic retinopathy without macular edema: Secondary | ICD-10-CM | POA: Diagnosis not present

## 2015-04-05 ENCOUNTER — Encounter (INDEPENDENT_AMBULATORY_CARE_PROVIDER_SITE_OTHER): Payer: Medicare Other | Admitting: Ophthalmology

## 2015-04-05 DIAGNOSIS — E11319 Type 2 diabetes mellitus with unspecified diabetic retinopathy without macular edema: Secondary | ICD-10-CM

## 2015-04-05 DIAGNOSIS — I10 Essential (primary) hypertension: Secondary | ICD-10-CM

## 2015-04-05 DIAGNOSIS — H43813 Vitreous degeneration, bilateral: Secondary | ICD-10-CM

## 2015-04-05 DIAGNOSIS — H35033 Hypertensive retinopathy, bilateral: Secondary | ICD-10-CM

## 2015-04-05 DIAGNOSIS — E11329 Type 2 diabetes mellitus with mild nonproliferative diabetic retinopathy without macular edema: Secondary | ICD-10-CM

## 2015-04-05 DIAGNOSIS — H3532 Exudative age-related macular degeneration: Secondary | ICD-10-CM | POA: Diagnosis not present

## 2015-05-03 ENCOUNTER — Encounter (INDEPENDENT_AMBULATORY_CARE_PROVIDER_SITE_OTHER): Payer: Medicare Other | Admitting: Ophthalmology

## 2015-05-03 DIAGNOSIS — E11329 Type 2 diabetes mellitus with mild nonproliferative diabetic retinopathy without macular edema: Secondary | ICD-10-CM

## 2015-05-03 DIAGNOSIS — E11319 Type 2 diabetes mellitus with unspecified diabetic retinopathy without macular edema: Secondary | ICD-10-CM | POA: Diagnosis not present

## 2015-05-03 DIAGNOSIS — H43813 Vitreous degeneration, bilateral: Secondary | ICD-10-CM | POA: Diagnosis not present

## 2015-05-03 DIAGNOSIS — H35033 Hypertensive retinopathy, bilateral: Secondary | ICD-10-CM | POA: Diagnosis not present

## 2015-05-03 DIAGNOSIS — H3532 Exudative age-related macular degeneration: Secondary | ICD-10-CM | POA: Diagnosis not present

## 2015-05-03 DIAGNOSIS — I10 Essential (primary) hypertension: Secondary | ICD-10-CM

## 2015-05-31 ENCOUNTER — Encounter (INDEPENDENT_AMBULATORY_CARE_PROVIDER_SITE_OTHER): Payer: Medicare Other | Admitting: Ophthalmology

## 2015-05-31 DIAGNOSIS — E11329 Type 2 diabetes mellitus with mild nonproliferative diabetic retinopathy without macular edema: Secondary | ICD-10-CM

## 2015-05-31 DIAGNOSIS — I10 Essential (primary) hypertension: Secondary | ICD-10-CM

## 2015-05-31 DIAGNOSIS — H43813 Vitreous degeneration, bilateral: Secondary | ICD-10-CM | POA: Diagnosis not present

## 2015-05-31 DIAGNOSIS — H35033 Hypertensive retinopathy, bilateral: Secondary | ICD-10-CM | POA: Diagnosis not present

## 2015-05-31 DIAGNOSIS — E11319 Type 2 diabetes mellitus with unspecified diabetic retinopathy without macular edema: Secondary | ICD-10-CM | POA: Diagnosis not present

## 2015-05-31 DIAGNOSIS — H3532 Exudative age-related macular degeneration: Secondary | ICD-10-CM | POA: Diagnosis not present

## 2015-06-28 ENCOUNTER — Encounter (INDEPENDENT_AMBULATORY_CARE_PROVIDER_SITE_OTHER): Payer: Medicare Other | Admitting: Ophthalmology

## 2015-06-28 DIAGNOSIS — H43813 Vitreous degeneration, bilateral: Secondary | ICD-10-CM

## 2015-06-28 DIAGNOSIS — I10 Essential (primary) hypertension: Secondary | ICD-10-CM

## 2015-06-28 DIAGNOSIS — H35033 Hypertensive retinopathy, bilateral: Secondary | ICD-10-CM | POA: Diagnosis not present

## 2015-06-28 DIAGNOSIS — H3532 Exudative age-related macular degeneration: Secondary | ICD-10-CM

## 2015-06-28 DIAGNOSIS — H35371 Puckering of macula, right eye: Secondary | ICD-10-CM

## 2015-06-28 DIAGNOSIS — E11319 Type 2 diabetes mellitus with unspecified diabetic retinopathy without macular edema: Secondary | ICD-10-CM

## 2015-06-28 DIAGNOSIS — E11329 Type 2 diabetes mellitus with mild nonproliferative diabetic retinopathy without macular edema: Secondary | ICD-10-CM

## 2015-07-26 ENCOUNTER — Encounter (INDEPENDENT_AMBULATORY_CARE_PROVIDER_SITE_OTHER): Payer: Medicare Other | Admitting: Ophthalmology

## 2015-07-26 DIAGNOSIS — I10 Essential (primary) hypertension: Secondary | ICD-10-CM | POA: Diagnosis not present

## 2015-07-26 DIAGNOSIS — E11319 Type 2 diabetes mellitus with unspecified diabetic retinopathy without macular edema: Secondary | ICD-10-CM

## 2015-07-26 DIAGNOSIS — H43813 Vitreous degeneration, bilateral: Secondary | ICD-10-CM | POA: Diagnosis not present

## 2015-07-26 DIAGNOSIS — E11329 Type 2 diabetes mellitus with mild nonproliferative diabetic retinopathy without macular edema: Secondary | ICD-10-CM | POA: Diagnosis not present

## 2015-07-26 DIAGNOSIS — H35033 Hypertensive retinopathy, bilateral: Secondary | ICD-10-CM | POA: Diagnosis not present

## 2015-07-26 DIAGNOSIS — H3532 Exudative age-related macular degeneration: Secondary | ICD-10-CM | POA: Diagnosis not present

## 2015-08-23 ENCOUNTER — Encounter (INDEPENDENT_AMBULATORY_CARE_PROVIDER_SITE_OTHER): Payer: Medicare Other | Admitting: Ophthalmology

## 2015-08-23 DIAGNOSIS — E11319 Type 2 diabetes mellitus with unspecified diabetic retinopathy without macular edema: Secondary | ICD-10-CM | POA: Diagnosis not present

## 2015-08-23 DIAGNOSIS — H43813 Vitreous degeneration, bilateral: Secondary | ICD-10-CM

## 2015-08-23 DIAGNOSIS — E11329 Type 2 diabetes mellitus with mild nonproliferative diabetic retinopathy without macular edema: Secondary | ICD-10-CM

## 2015-08-23 DIAGNOSIS — H3532 Exudative age-related macular degeneration: Secondary | ICD-10-CM

## 2015-08-23 DIAGNOSIS — H35033 Hypertensive retinopathy, bilateral: Secondary | ICD-10-CM | POA: Diagnosis not present

## 2015-08-23 DIAGNOSIS — I10 Essential (primary) hypertension: Secondary | ICD-10-CM | POA: Diagnosis not present

## 2015-09-19 ENCOUNTER — Encounter (INDEPENDENT_AMBULATORY_CARE_PROVIDER_SITE_OTHER): Payer: Medicare Other | Admitting: Ophthalmology

## 2015-09-19 DIAGNOSIS — E113293 Type 2 diabetes mellitus with mild nonproliferative diabetic retinopathy without macular edema, bilateral: Secondary | ICD-10-CM

## 2015-09-19 DIAGNOSIS — H35033 Hypertensive retinopathy, bilateral: Secondary | ICD-10-CM

## 2015-09-19 DIAGNOSIS — I10 Essential (primary) hypertension: Secondary | ICD-10-CM | POA: Diagnosis not present

## 2015-09-19 DIAGNOSIS — H43813 Vitreous degeneration, bilateral: Secondary | ICD-10-CM | POA: Diagnosis not present

## 2015-09-19 DIAGNOSIS — E11319 Type 2 diabetes mellitus with unspecified diabetic retinopathy without macular edema: Secondary | ICD-10-CM

## 2015-09-19 DIAGNOSIS — H353231 Exudative age-related macular degeneration, bilateral, with active choroidal neovascularization: Secondary | ICD-10-CM | POA: Diagnosis not present

## 2015-10-17 ENCOUNTER — Encounter (INDEPENDENT_AMBULATORY_CARE_PROVIDER_SITE_OTHER): Payer: Medicare Other | Admitting: Ophthalmology

## 2015-10-17 DIAGNOSIS — I10 Essential (primary) hypertension: Secondary | ICD-10-CM

## 2015-10-17 DIAGNOSIS — E113291 Type 2 diabetes mellitus with mild nonproliferative diabetic retinopathy without macular edema, right eye: Secondary | ICD-10-CM

## 2015-10-17 DIAGNOSIS — E11311 Type 2 diabetes mellitus with unspecified diabetic retinopathy with macular edema: Secondary | ICD-10-CM

## 2015-10-17 DIAGNOSIS — H35033 Hypertensive retinopathy, bilateral: Secondary | ICD-10-CM

## 2015-10-17 DIAGNOSIS — E113313 Type 2 diabetes mellitus with moderate nonproliferative diabetic retinopathy with macular edema, bilateral: Secondary | ICD-10-CM | POA: Diagnosis not present

## 2015-10-17 DIAGNOSIS — H353231 Exudative age-related macular degeneration, bilateral, with active choroidal neovascularization: Secondary | ICD-10-CM

## 2015-10-17 DIAGNOSIS — H43813 Vitreous degeneration, bilateral: Secondary | ICD-10-CM

## 2015-10-18 ENCOUNTER — Encounter (INDEPENDENT_AMBULATORY_CARE_PROVIDER_SITE_OTHER): Payer: Medicare Other | Admitting: Ophthalmology

## 2015-11-15 ENCOUNTER — Encounter (INDEPENDENT_AMBULATORY_CARE_PROVIDER_SITE_OTHER): Payer: Medicare Other | Admitting: Ophthalmology

## 2015-11-15 DIAGNOSIS — E113213 Type 2 diabetes mellitus with mild nonproliferative diabetic retinopathy with macular edema, bilateral: Secondary | ICD-10-CM

## 2015-11-15 DIAGNOSIS — I10 Essential (primary) hypertension: Secondary | ICD-10-CM | POA: Diagnosis not present

## 2015-11-15 DIAGNOSIS — H35033 Hypertensive retinopathy, bilateral: Secondary | ICD-10-CM

## 2015-11-15 DIAGNOSIS — H353231 Exudative age-related macular degeneration, bilateral, with active choroidal neovascularization: Secondary | ICD-10-CM | POA: Diagnosis not present

## 2015-11-15 DIAGNOSIS — E11311 Type 2 diabetes mellitus with unspecified diabetic retinopathy with macular edema: Secondary | ICD-10-CM

## 2015-11-15 DIAGNOSIS — H43813 Vitreous degeneration, bilateral: Secondary | ICD-10-CM | POA: Diagnosis not present

## 2015-12-20 ENCOUNTER — Encounter (INDEPENDENT_AMBULATORY_CARE_PROVIDER_SITE_OTHER): Payer: Medicare Other | Admitting: Ophthalmology

## 2015-12-20 DIAGNOSIS — I10 Essential (primary) hypertension: Secondary | ICD-10-CM | POA: Diagnosis not present

## 2015-12-20 DIAGNOSIS — H43813 Vitreous degeneration, bilateral: Secondary | ICD-10-CM | POA: Diagnosis not present

## 2015-12-20 DIAGNOSIS — H353231 Exudative age-related macular degeneration, bilateral, with active choroidal neovascularization: Secondary | ICD-10-CM | POA: Diagnosis not present

## 2015-12-20 DIAGNOSIS — E11311 Type 2 diabetes mellitus with unspecified diabetic retinopathy with macular edema: Secondary | ICD-10-CM

## 2015-12-20 DIAGNOSIS — H35033 Hypertensive retinopathy, bilateral: Secondary | ICD-10-CM | POA: Diagnosis not present

## 2015-12-20 DIAGNOSIS — E113213 Type 2 diabetes mellitus with mild nonproliferative diabetic retinopathy with macular edema, bilateral: Secondary | ICD-10-CM

## 2016-01-04 ENCOUNTER — Other Ambulatory Visit: Payer: Self-pay | Admitting: Cardiology

## 2016-01-09 ENCOUNTER — Other Ambulatory Visit: Payer: Self-pay | Admitting: Cardiology

## 2016-01-09 NOTE — Telephone Encounter (Signed)
Medication Detail      Disp Refills Start End     metoprolol succinate (TOPROL-XL) 25 MG 24 hr tablet 30 tablet 0 01/04/2016     Sig: TAKE 1 TABLET BY MOUTH EVERY DAY    Notes to Pharmacy: Please call and schedule a one year follow up appointment    E-Prescribing Status: Receipt confirmed by pharmacy (01/04/2016 1:14 PM EST)     Pharmacy    [id number] - [place], [place]

## 2016-01-10 ENCOUNTER — Telehealth: Payer: Self-pay

## 2016-01-10 NOTE — Telephone Encounter (Signed)
Patient called in to ask why his refill for Metoprolol was denied. He stated he was completely out of medication and that [facility] said they did not have a refill for him to pick up because we had refused his refill request. I explained he needs to schedule an office visit to keep receiving refills. Then I placed pt on hold and called [facility] and verified they did have refill of Metoprolol sent in on 01/04/2016 ready and waiting for the patient to pick up. I explained to patient that metoprolol was ready for him to pick up and asked if he would like to be transferred to scheduling to make his follow up appointment. He stated he was unhappy with Dr Radford Pax since she has moved here from [facility] and that he would call back to schedule appointment. I made sure he had our correct phone number.

## 2016-01-17 ENCOUNTER — Encounter (INDEPENDENT_AMBULATORY_CARE_PROVIDER_SITE_OTHER): Payer: Medicare Other | Admitting: Ophthalmology

## 2016-01-17 DIAGNOSIS — E11311 Type 2 diabetes mellitus with unspecified diabetic retinopathy with macular edema: Secondary | ICD-10-CM

## 2016-01-17 DIAGNOSIS — E113213 Type 2 diabetes mellitus with mild nonproliferative diabetic retinopathy with macular edema, bilateral: Secondary | ICD-10-CM

## 2016-01-17 DIAGNOSIS — H43813 Vitreous degeneration, bilateral: Secondary | ICD-10-CM | POA: Diagnosis not present

## 2016-01-17 DIAGNOSIS — H35033 Hypertensive retinopathy, bilateral: Secondary | ICD-10-CM | POA: Diagnosis not present

## 2016-01-17 DIAGNOSIS — H353231 Exudative age-related macular degeneration, bilateral, with active choroidal neovascularization: Secondary | ICD-10-CM

## 2016-01-17 DIAGNOSIS — I10 Essential (primary) hypertension: Secondary | ICD-10-CM

## 2016-01-26 ENCOUNTER — Other Ambulatory Visit: Payer: Self-pay | Admitting: Cardiology

## 2016-02-06 ENCOUNTER — Other Ambulatory Visit: Payer: Self-pay | Admitting: Cardiology

## 2016-02-14 ENCOUNTER — Encounter (INDEPENDENT_AMBULATORY_CARE_PROVIDER_SITE_OTHER): Payer: Medicare Other | Admitting: Ophthalmology

## 2016-02-14 DIAGNOSIS — I10 Essential (primary) hypertension: Secondary | ICD-10-CM | POA: Diagnosis not present

## 2016-02-14 DIAGNOSIS — H43813 Vitreous degeneration, bilateral: Secondary | ICD-10-CM

## 2016-02-14 DIAGNOSIS — E113213 Type 2 diabetes mellitus with mild nonproliferative diabetic retinopathy with macular edema, bilateral: Secondary | ICD-10-CM

## 2016-02-14 DIAGNOSIS — E11311 Type 2 diabetes mellitus with unspecified diabetic retinopathy with macular edema: Secondary | ICD-10-CM

## 2016-02-14 DIAGNOSIS — H35033 Hypertensive retinopathy, bilateral: Secondary | ICD-10-CM | POA: Diagnosis not present

## 2016-02-14 DIAGNOSIS — H353231 Exudative age-related macular degeneration, bilateral, with active choroidal neovascularization: Secondary | ICD-10-CM

## 2016-02-20 ENCOUNTER — Other Ambulatory Visit: Payer: Self-pay | Admitting: Cardiology

## 2016-02-26 NOTE — Progress Notes (Signed)
Cardiology Office Note:    Date:  02/27/2016   ID:  [name], DOB 28-Apr-1941, MRN [id number]  PCP:  Kandice Hams, MD  Cardiologist:  Dr. Fransico Him   Electrophysiologist:  n/a  Chief Complaint  Patient presents with  . Coronary Artery Disease    Follow up    History of Present Illness:     [name] is a 75 y.o. male with a hx of CAD status post CABG in 2002, ischemic cardiomyopathy, HTN, HL, diabetes, COPD, CKD. Last seen by Dr. Radford Pax 1/16. Returns for follow-up.  The patient denies chest pain, significant shortness of breath, syncope, orthopnea, PND or significant pedal edema. He has COPD and his SOB has actually gotten better. He has cut back on salt and has lost 13 lbs since last seen.   Past Medical History  Diagnosis Date  . Depression   . Anxiety   . Hypersomnia   . Dyslipidemia   . Coronary atherosclerosis of native coronary artery 2002    s/p MI with Ischemic CM EF 40% with apical akinesis at cath 2002 intolerant to ACE inhibitors and ARBs - EF 47% by nuclear stress test 08/2010 Dr. Radford Pax   . CAD (coronary artery disease)      s/p CABG with LIMA to LAD, SVG to diag, SVG to OM, SVG to RCA cath 2002 Dr. Radford Pax  . Ischemic dilated cardiomyopathy     EF 40% with apical AK at cath intolerant to ACE I and ARBS, EF 43% by nuclear stress test 08/2010  . Hypercholesteremia   . Claudication ([place])     normal ABIs  . Cataract   . Chronic kidney disease   . Kidney stones   . Hypertension   . Diabetes mellitus without complication [facility])     Past Surgical History  Procedure Laterality Date  . Coronary artery bypass graft      with LIMA to LAD, SVG to diag, SVG to OM, SVG to RCA cath 2002 Dr. Radford Pax  . Urologic procedure for fertility      Current Medications: Outpatient Prescriptions Prior to Visit  Medication Sig Dispense Refill  . aspirin EC 81 MG tablet Take 81 mg by mouth daily.    Marland Kitchen escitalopram (LEXAPRO) 10 MG tablet Take 1 tablet by mouth daily.     . metFORMIN (GLUCOPHAGE) 500 MG tablet Take by mouth 2 (two) times daily with a meal.    . Multiple Vitamin (MULTIVITAMIN) capsule Take 1 capsule by mouth daily.    Marland Kitchen SIMBRINZA 1-0.2 % SUSP Place 1 drop into the left eye 2 (two) times daily.   4  . amLODipine (NORVASC) 10 MG tablet TAKE 1 TABLET BY MOUTH ONCE DAILY **NEEDS APPOINTMENT** 30 tablet 1  . metoprolol succinate (TOPROL-XL) 25 MG 24 hr tablet TAKE 1 TABLET BY MOUTH EVERY DAY 30 tablet 1  . simvastatin (ZOCOR) 20 MG tablet TAKE 1 TABLET (20 MG TOTAL) BY MOUTH AT BEDTIME. 30 tablet 0  . simvastatin (ZOCOR) 20 MG tablet TAKE 1 TABLET (20 MG TOTAL) BY MOUTH AT BEDTIME. 15 tablet 0   No facility-administered medications prior to visit.     Allergies:   Review of patient's allergies indicates no known allergies.   Social History   Social History  . Marital Status: Married    Spouse Name: N/A  . Number of Children: N/A  . Years of Education: N/A   Social History Main Topics  . Smoking status: Former Audiological scientist  date: 10/25/2001  . Smokeless tobacco: None  . Alcohol Use: Yes     Comment: occasional beer and wine  . Drug Use: No  . Sexual Activity: Not Asked   Other Topics Concern  . None   Social History Narrative     Family History:  The patient's family history includes Breast cancer in his father and sister; CAD in his sister; CVA in his father; Diabetes in his brother, mother, sister, and sister; Heart disease in his brother and sister; Hypertension in his father, mother, sister, and sister.   ROS:   Please see the history of present illness.    Review of Systems  HENT: Positive for headaches.   Eyes: Positive for visual disturbance.  Cardiovascular: Positive for dyspnea on exertion and leg swelling.  Hematologic/Lymphatic: Bruises/bleeds easily.  All other systems reviewed and are negative.   Physical Exam:    VS:  BP 120/60 mmHg  Pulse 78  Ht 5\' 7"  (1.702 m)  Wt 237 lb 12.8 oz (107.865 kg)  BMI  37.24 kg/m2   GEN: Well nourished, well developed, in no acute distress HEENT: normal Neck: no JVD, no masses Cardiac: Normal S1/S2,  RRR; no murmurs, rubs, or gallops, trace RLE edema;  no carotid bruits,   Respiratory:  clear to auscultation bilaterally; no wheezing, rhonchi or rales GI: soft, nontender, nondistended MS: no deformity or atrophy Skin: warm and dry Neuro: No focal deficits  Psych: Alert and oriented x 3, normal affect  Wt Readings from Last 3 Encounters:  02/27/16 237 lb 12.8 oz (107.865 kg)  12/28/14 250 lb (113.399 kg)  10/04/14 253 lb (114.76 kg)      Studies/Labs Reviewed:     EKG:  EKG is  ordered today.  The ekg ordered today demonstrates NSR, HR 78, LAD, ant Q waves, QTc 449 ms, no change since last tracing   Recent Labs: No results found for requested labs within last 365 days.   Recent Lipid Panel    Component Value Date/Time   CHOL 156 02/01/2015 1328   TRIG 71.0 02/01/2015 1328   HDL 60.30 02/01/2015 1328   CHOLHDL 3 02/01/2015 1328   VLDL 14.2 02/01/2015 1328   LDLCALC 82 02/01/2015 1328   LDLDIRECT 169.5 10/25/2013 1201    Additional studies/ records that were reviewed today include:   Myoview 10/13 Inferior, apical scar, no ischemia, EF 43%, low risk  ASSESSMENT:     1. Coronary artery disease involving native coronary artery of native heart without angina pectoris   2. Ischemic dilated cardiomyopathy   3. Essential hypertension   4. Dyslipidemia     PLAN:     In order of problems listed above:  1. CAD - s/p CABG.  Last nuc study in 2013 low risk.  He is doing well without angina. Continue aspirin, beta blocker, statin.  2. Ischemic CM - EF 43% by recent Nuc study.  No prior hx of CHF symptoms and he is intol to ACE/ARB.  He is NYHA 2.  3. HTN - Blood pressure is controlled. Continue current regimen. Arrange follow-up CMET.  4. HL - Continue statin. LDL in 4/16 was 85. Arrange follow-up lipids and LFTs in  03/2016.    Medication Adjustments/Labs and Tests Ordered: Current medicines are reviewed at length with the patient today.  Concerns regarding medicines are outlined above.  Medication changes, Labs and Tests ordered today are outlined in the Patient Instructions noted below. Patient Instructions  Medication Instructions:  No changes.  See your medication list. I sent in refills for your Simvastatin, Toprol-XL, Amlodipine.   Labwork: Return sometime in April 2017 to get fasting labs: CMET, Lipids  Testing/Procedures: None   Follow-Up: Your physician wants you to follow-up in: 1 year with Dr. Mallie Snooks will receive a reminder letter in the mail two months in advance. If you don't receive a letter, please call our office to schedule the follow-up appointment.  Any Other Special Instructions Will Be Listed Below (If Applicable).  If you need a refill on your cardiac medications before your next appointment, please call your pharmacy.    Signed, Richardson Dopp, PA-C  02/27/2016 11:15 AM    [address], [place], [place]  [address] Phone: [phone]; Fax: [phone]

## 2016-02-27 ENCOUNTER — Encounter: Payer: Self-pay | Admitting: Physician Assistant

## 2016-02-27 ENCOUNTER — Ambulatory Visit (INDEPENDENT_AMBULATORY_CARE_PROVIDER_SITE_OTHER): Payer: Medicare Other | Admitting: Physician Assistant

## 2016-02-27 VITALS — BP 120/60 | HR 78 | Ht 67.0 in | Wt 237.8 lb

## 2016-02-27 DIAGNOSIS — I2510 Atherosclerotic heart disease of native coronary artery without angina pectoris: Secondary | ICD-10-CM

## 2016-02-27 DIAGNOSIS — E785 Hyperlipidemia, unspecified: Secondary | ICD-10-CM | POA: Diagnosis not present

## 2016-02-27 DIAGNOSIS — I10 Essential (primary) hypertension: Secondary | ICD-10-CM

## 2016-02-27 DIAGNOSIS — I255 Ischemic cardiomyopathy: Secondary | ICD-10-CM | POA: Diagnosis not present

## 2016-02-27 DIAGNOSIS — I420 Dilated cardiomyopathy: Secondary | ICD-10-CM

## 2016-02-27 MED ORDER — SIMVASTATIN 20 MG PO TABS: 20.0000 mg | ORAL_TABLET | Freq: Every day | ORAL | Status: DC

## 2016-02-27 MED ORDER — METOPROLOL SUCCINATE ER 25 MG PO TB24: 25.0000 mg | ORAL_TABLET | Freq: Every day | ORAL | Status: DC

## 2016-02-27 MED ORDER — AMLODIPINE BESYLATE 10 MG PO TABS: 10.0000 mg | ORAL_TABLET | Freq: Every day | ORAL | Status: DC

## 2016-02-27 NOTE — Patient Instructions (Addendum)
Medication Instructions:  No changes.  See your medication list. I sent in refills for your Simvastatin, Toprol-XL, Amlodipine.   Labwork: Return sometime in April 2017 to get fasting labs: CMET, Lipids  Testing/Procedures: None   Follow-Up: Your physician wants you to follow-up in: 1 year with Dr. Mallie Snooks will receive a reminder letter in the mail two months in advance. If you don't receive a letter, please call our office to schedule the follow-up appointment.  Any Other Special Instructions Will Be Listed Below (If Applicable).  If you need a refill on your cardiac medications before your next appointment, please call your pharmacy.

## 2016-03-13 ENCOUNTER — Encounter (INDEPENDENT_AMBULATORY_CARE_PROVIDER_SITE_OTHER): Payer: Medicare Other | Admitting: Ophthalmology

## 2016-03-13 DIAGNOSIS — H353231 Exudative age-related macular degeneration, bilateral, with active choroidal neovascularization: Secondary | ICD-10-CM | POA: Diagnosis not present

## 2016-03-13 DIAGNOSIS — E11311 Type 2 diabetes mellitus with unspecified diabetic retinopathy with macular edema: Secondary | ICD-10-CM | POA: Diagnosis not present

## 2016-03-13 DIAGNOSIS — E113213 Type 2 diabetes mellitus with mild nonproliferative diabetic retinopathy with macular edema, bilateral: Secondary | ICD-10-CM | POA: Diagnosis not present

## 2016-04-02 ENCOUNTER — Other Ambulatory Visit (INDEPENDENT_AMBULATORY_CARE_PROVIDER_SITE_OTHER): Payer: Medicare Other | Admitting: *Deleted

## 2016-04-02 ENCOUNTER — Telehealth: Payer: Self-pay | Admitting: *Deleted

## 2016-04-02 DIAGNOSIS — I10 Essential (primary) hypertension: Secondary | ICD-10-CM

## 2016-04-02 DIAGNOSIS — I2510 Atherosclerotic heart disease of native coronary artery without angina pectoris: Secondary | ICD-10-CM

## 2016-04-02 DIAGNOSIS — E785 Hyperlipidemia, unspecified: Secondary | ICD-10-CM

## 2016-04-02 LAB — LIPID PANEL
CHOLESTEROL: 179 mg/dL (ref 125–200)
HDL: 79 mg/dL (ref >=40)
LDL Cholesterol: 88 mg/dL (ref <130)
TRIGLYCERIDES: 60 mg/dL (ref <150)
Total CHOL/HDL Ratio: 2.3 Ratio (ref <=5.0)
VLDL: 12 mg/dL (ref <30)

## 2016-04-02 LAB — COMPREHENSIVE METABOLIC PANEL
ALBUMIN: 4.5 g/dL (ref 3.6–5.1)
ALK PHOS: 63 U/L (ref 40–115)
ALT: 18 U/L (ref 9–46)
AST: 17 U/L (ref 10–35)
BUN: 12 mg/dL (ref 7–25)
CHLORIDE: 103 mmol/L (ref 98–110)
CO2: 26 mmol/L (ref 20–31)
Calcium: 8.8 mg/dL (ref 8.6–10.3)
Creat: 0.77 mg/dL (ref 0.70–1.18)
Glucose, Bld: 102 mg/dL — ABNORMAL HIGH (ref 65–99)
POTASSIUM: 4.5 mmol/L (ref 3.5–5.3)
Sodium: 140 mmol/L (ref 135–146)
TOTAL PROTEIN: 6.6 g/dL (ref 6.1–8.1)
Total Bilirubin: 0.6 mg/dL (ref 0.2–1.2)

## 2016-04-02 NOTE — Telephone Encounter (Signed)
Lmtcb for lab results 

## 2016-04-03 NOTE — Telephone Encounter (Signed)
Lmtcb x 2. Lab work ok. Continue current treatment plan. Any questions cb [phone].

## 2016-04-09 ENCOUNTER — Encounter (INDEPENDENT_AMBULATORY_CARE_PROVIDER_SITE_OTHER): Payer: Medicare Other | Admitting: Ophthalmology

## 2016-04-09 DIAGNOSIS — I10 Essential (primary) hypertension: Secondary | ICD-10-CM | POA: Diagnosis not present

## 2016-04-09 DIAGNOSIS — E113213 Type 2 diabetes mellitus with mild nonproliferative diabetic retinopathy with macular edema, bilateral: Secondary | ICD-10-CM

## 2016-04-09 DIAGNOSIS — E11311 Type 2 diabetes mellitus with unspecified diabetic retinopathy with macular edema: Secondary | ICD-10-CM | POA: Diagnosis not present

## 2016-04-09 DIAGNOSIS — H35033 Hypertensive retinopathy, bilateral: Secondary | ICD-10-CM

## 2016-04-09 DIAGNOSIS — H353231 Exudative age-related macular degeneration, bilateral, with active choroidal neovascularization: Secondary | ICD-10-CM

## 2016-04-09 DIAGNOSIS — H43813 Vitreous degeneration, bilateral: Secondary | ICD-10-CM

## 2016-05-07 ENCOUNTER — Encounter (INDEPENDENT_AMBULATORY_CARE_PROVIDER_SITE_OTHER): Payer: Medicare Other | Admitting: Ophthalmology

## 2016-05-07 DIAGNOSIS — I10 Essential (primary) hypertension: Secondary | ICD-10-CM

## 2016-05-07 DIAGNOSIS — H353231 Exudative age-related macular degeneration, bilateral, with active choroidal neovascularization: Secondary | ICD-10-CM | POA: Diagnosis not present

## 2016-05-07 DIAGNOSIS — H35033 Hypertensive retinopathy, bilateral: Secondary | ICD-10-CM | POA: Diagnosis not present

## 2016-05-07 DIAGNOSIS — H43813 Vitreous degeneration, bilateral: Secondary | ICD-10-CM

## 2016-05-07 DIAGNOSIS — E11311 Type 2 diabetes mellitus with unspecified diabetic retinopathy with macular edema: Secondary | ICD-10-CM

## 2016-05-07 DIAGNOSIS — E113213 Type 2 diabetes mellitus with mild nonproliferative diabetic retinopathy with macular edema, bilateral: Secondary | ICD-10-CM

## 2016-05-14 DIAGNOSIS — H3530 Unspecified macular degeneration: Secondary | ICD-10-CM | POA: Insufficient documentation

## 2016-05-14 HISTORY — DX: Unspecified macular degeneration: H35.30

## 2016-06-04 ENCOUNTER — Encounter (INDEPENDENT_AMBULATORY_CARE_PROVIDER_SITE_OTHER): Payer: Medicare Other | Admitting: Ophthalmology

## 2016-06-04 DIAGNOSIS — E11311 Type 2 diabetes mellitus with unspecified diabetic retinopathy with macular edema: Secondary | ICD-10-CM

## 2016-06-04 DIAGNOSIS — H353231 Exudative age-related macular degeneration, bilateral, with active choroidal neovascularization: Secondary | ICD-10-CM

## 2016-06-04 DIAGNOSIS — H35033 Hypertensive retinopathy, bilateral: Secondary | ICD-10-CM

## 2016-06-04 DIAGNOSIS — E113213 Type 2 diabetes mellitus with mild nonproliferative diabetic retinopathy with macular edema, bilateral: Secondary | ICD-10-CM | POA: Diagnosis not present

## 2016-06-04 DIAGNOSIS — I10 Essential (primary) hypertension: Secondary | ICD-10-CM

## 2016-06-04 DIAGNOSIS — H43813 Vitreous degeneration, bilateral: Secondary | ICD-10-CM

## 2016-06-11 ENCOUNTER — Ambulatory Visit (HOSPITAL_COMMUNITY): Payer: Medicare Other

## 2016-07-02 ENCOUNTER — Encounter (INDEPENDENT_AMBULATORY_CARE_PROVIDER_SITE_OTHER): Payer: Medicare Other | Admitting: Ophthalmology

## 2016-07-02 DIAGNOSIS — H35033 Hypertensive retinopathy, bilateral: Secondary | ICD-10-CM

## 2016-07-02 DIAGNOSIS — H43813 Vitreous degeneration, bilateral: Secondary | ICD-10-CM | POA: Diagnosis not present

## 2016-07-02 DIAGNOSIS — I10 Essential (primary) hypertension: Secondary | ICD-10-CM

## 2016-07-02 DIAGNOSIS — E11311 Type 2 diabetes mellitus with unspecified diabetic retinopathy with macular edema: Secondary | ICD-10-CM | POA: Diagnosis not present

## 2016-07-02 DIAGNOSIS — H353231 Exudative age-related macular degeneration, bilateral, with active choroidal neovascularization: Secondary | ICD-10-CM

## 2016-07-02 DIAGNOSIS — E113213 Type 2 diabetes mellitus with mild nonproliferative diabetic retinopathy with macular edema, bilateral: Secondary | ICD-10-CM | POA: Diagnosis not present

## 2016-07-10 ENCOUNTER — Encounter (INDEPENDENT_AMBULATORY_CARE_PROVIDER_SITE_OTHER): Payer: Medicare Other | Admitting: Ophthalmology

## 2016-07-10 DIAGNOSIS — H353231 Exudative age-related macular degeneration, bilateral, with active choroidal neovascularization: Secondary | ICD-10-CM

## 2016-07-30 ENCOUNTER — Encounter (INDEPENDENT_AMBULATORY_CARE_PROVIDER_SITE_OTHER): Payer: Medicare Other | Admitting: Ophthalmology

## 2016-07-30 DIAGNOSIS — I10 Essential (primary) hypertension: Secondary | ICD-10-CM

## 2016-07-30 DIAGNOSIS — H35033 Hypertensive retinopathy, bilateral: Secondary | ICD-10-CM

## 2016-07-30 DIAGNOSIS — H353231 Exudative age-related macular degeneration, bilateral, with active choroidal neovascularization: Secondary | ICD-10-CM

## 2016-07-30 DIAGNOSIS — H43813 Vitreous degeneration, bilateral: Secondary | ICD-10-CM | POA: Diagnosis not present

## 2016-07-30 DIAGNOSIS — E11311 Type 2 diabetes mellitus with unspecified diabetic retinopathy with macular edema: Secondary | ICD-10-CM

## 2016-07-30 DIAGNOSIS — E113213 Type 2 diabetes mellitus with mild nonproliferative diabetic retinopathy with macular edema, bilateral: Secondary | ICD-10-CM

## 2016-08-27 ENCOUNTER — Encounter (INDEPENDENT_AMBULATORY_CARE_PROVIDER_SITE_OTHER): Payer: Medicare Other | Admitting: Ophthalmology

## 2016-08-27 DIAGNOSIS — H353231 Exudative age-related macular degeneration, bilateral, with active choroidal neovascularization: Secondary | ICD-10-CM

## 2016-08-27 DIAGNOSIS — H43813 Vitreous degeneration, bilateral: Secondary | ICD-10-CM

## 2016-08-27 DIAGNOSIS — E11311 Type 2 diabetes mellitus with unspecified diabetic retinopathy with macular edema: Secondary | ICD-10-CM | POA: Diagnosis not present

## 2016-08-27 DIAGNOSIS — H35033 Hypertensive retinopathy, bilateral: Secondary | ICD-10-CM | POA: Diagnosis not present

## 2016-08-27 DIAGNOSIS — I10 Essential (primary) hypertension: Secondary | ICD-10-CM | POA: Diagnosis not present

## 2016-08-27 DIAGNOSIS — E113213 Type 2 diabetes mellitus with mild nonproliferative diabetic retinopathy with macular edema, bilateral: Secondary | ICD-10-CM

## 2016-09-24 ENCOUNTER — Encounter (INDEPENDENT_AMBULATORY_CARE_PROVIDER_SITE_OTHER): Payer: Medicare Other | Admitting: Ophthalmology

## 2016-09-24 DIAGNOSIS — I10 Essential (primary) hypertension: Secondary | ICD-10-CM | POA: Diagnosis not present

## 2016-09-24 DIAGNOSIS — E11311 Type 2 diabetes mellitus with unspecified diabetic retinopathy with macular edema: Secondary | ICD-10-CM

## 2016-09-24 DIAGNOSIS — H353231 Exudative age-related macular degeneration, bilateral, with active choroidal neovascularization: Secondary | ICD-10-CM | POA: Diagnosis not present

## 2016-09-24 DIAGNOSIS — H35033 Hypertensive retinopathy, bilateral: Secondary | ICD-10-CM | POA: Diagnosis not present

## 2016-09-24 DIAGNOSIS — E113213 Type 2 diabetes mellitus with mild nonproliferative diabetic retinopathy with macular edema, bilateral: Secondary | ICD-10-CM

## 2016-09-24 DIAGNOSIS — H43813 Vitreous degeneration, bilateral: Secondary | ICD-10-CM

## 2016-10-22 ENCOUNTER — Encounter (INDEPENDENT_AMBULATORY_CARE_PROVIDER_SITE_OTHER): Payer: Medicare Other | Admitting: Ophthalmology

## 2016-10-22 DIAGNOSIS — I10 Essential (primary) hypertension: Secondary | ICD-10-CM

## 2016-10-22 DIAGNOSIS — H353231 Exudative age-related macular degeneration, bilateral, with active choroidal neovascularization: Secondary | ICD-10-CM | POA: Diagnosis not present

## 2016-10-22 DIAGNOSIS — H43813 Vitreous degeneration, bilateral: Secondary | ICD-10-CM

## 2016-10-22 DIAGNOSIS — H35033 Hypertensive retinopathy, bilateral: Secondary | ICD-10-CM | POA: Diagnosis not present

## 2016-10-22 DIAGNOSIS — E113213 Type 2 diabetes mellitus with mild nonproliferative diabetic retinopathy with macular edema, bilateral: Secondary | ICD-10-CM | POA: Diagnosis not present

## 2016-10-22 DIAGNOSIS — E11311 Type 2 diabetes mellitus with unspecified diabetic retinopathy with macular edema: Secondary | ICD-10-CM

## 2016-11-19 ENCOUNTER — Encounter (INDEPENDENT_AMBULATORY_CARE_PROVIDER_SITE_OTHER): Payer: Medicare Other | Admitting: Ophthalmology

## 2016-11-19 DIAGNOSIS — E113213 Type 2 diabetes mellitus with mild nonproliferative diabetic retinopathy with macular edema, bilateral: Secondary | ICD-10-CM

## 2016-11-19 DIAGNOSIS — E11311 Type 2 diabetes mellitus with unspecified diabetic retinopathy with macular edema: Secondary | ICD-10-CM

## 2016-11-19 DIAGNOSIS — H353231 Exudative age-related macular degeneration, bilateral, with active choroidal neovascularization: Secondary | ICD-10-CM | POA: Diagnosis not present

## 2016-11-19 DIAGNOSIS — I10 Essential (primary) hypertension: Secondary | ICD-10-CM | POA: Diagnosis not present

## 2016-11-19 DIAGNOSIS — H35033 Hypertensive retinopathy, bilateral: Secondary | ICD-10-CM | POA: Diagnosis not present

## 2016-11-19 DIAGNOSIS — H43813 Vitreous degeneration, bilateral: Secondary | ICD-10-CM | POA: Diagnosis not present

## 2016-12-17 ENCOUNTER — Encounter (INDEPENDENT_AMBULATORY_CARE_PROVIDER_SITE_OTHER): Payer: Medicare Other | Admitting: Ophthalmology

## 2016-12-17 DIAGNOSIS — E11311 Type 2 diabetes mellitus with unspecified diabetic retinopathy with macular edema: Secondary | ICD-10-CM

## 2016-12-17 DIAGNOSIS — H353231 Exudative age-related macular degeneration, bilateral, with active choroidal neovascularization: Secondary | ICD-10-CM

## 2016-12-17 DIAGNOSIS — E113213 Type 2 diabetes mellitus with mild nonproliferative diabetic retinopathy with macular edema, bilateral: Secondary | ICD-10-CM

## 2016-12-17 DIAGNOSIS — H35033 Hypertensive retinopathy, bilateral: Secondary | ICD-10-CM | POA: Diagnosis not present

## 2016-12-17 DIAGNOSIS — H43813 Vitreous degeneration, bilateral: Secondary | ICD-10-CM | POA: Diagnosis not present

## 2016-12-17 DIAGNOSIS — I10 Essential (primary) hypertension: Secondary | ICD-10-CM

## 2017-01-14 ENCOUNTER — Encounter (INDEPENDENT_AMBULATORY_CARE_PROVIDER_SITE_OTHER): Payer: Medicare Other | Admitting: Ophthalmology

## 2017-01-14 DIAGNOSIS — E113213 Type 2 diabetes mellitus with mild nonproliferative diabetic retinopathy with macular edema, bilateral: Secondary | ICD-10-CM

## 2017-01-14 DIAGNOSIS — H35033 Hypertensive retinopathy, bilateral: Secondary | ICD-10-CM

## 2017-01-14 DIAGNOSIS — E11311 Type 2 diabetes mellitus with unspecified diabetic retinopathy with macular edema: Secondary | ICD-10-CM

## 2017-01-14 DIAGNOSIS — H353231 Exudative age-related macular degeneration, bilateral, with active choroidal neovascularization: Secondary | ICD-10-CM | POA: Diagnosis not present

## 2017-01-14 DIAGNOSIS — I10 Essential (primary) hypertension: Secondary | ICD-10-CM | POA: Diagnosis not present

## 2017-01-14 DIAGNOSIS — H43813 Vitreous degeneration, bilateral: Secondary | ICD-10-CM

## 2017-02-11 ENCOUNTER — Encounter (INDEPENDENT_AMBULATORY_CARE_PROVIDER_SITE_OTHER): Payer: Medicare Other | Admitting: Ophthalmology

## 2017-02-11 DIAGNOSIS — H43813 Vitreous degeneration, bilateral: Secondary | ICD-10-CM | POA: Diagnosis not present

## 2017-02-11 DIAGNOSIS — H353231 Exudative age-related macular degeneration, bilateral, with active choroidal neovascularization: Secondary | ICD-10-CM

## 2017-02-11 DIAGNOSIS — H35033 Hypertensive retinopathy, bilateral: Secondary | ICD-10-CM

## 2017-02-11 DIAGNOSIS — E113213 Type 2 diabetes mellitus with mild nonproliferative diabetic retinopathy with macular edema, bilateral: Secondary | ICD-10-CM | POA: Diagnosis not present

## 2017-02-11 DIAGNOSIS — E11311 Type 2 diabetes mellitus with unspecified diabetic retinopathy with macular edema: Secondary | ICD-10-CM | POA: Diagnosis not present

## 2017-02-11 DIAGNOSIS — I10 Essential (primary) hypertension: Secondary | ICD-10-CM | POA: Diagnosis not present

## 2017-03-02 ENCOUNTER — Emergency Department (HOSPITAL_BASED_OUTPATIENT_CLINIC_OR_DEPARTMENT_OTHER): Payer: Medicare Other

## 2017-03-02 ENCOUNTER — Inpatient Hospital Stay (HOSPITAL_BASED_OUTPATIENT_CLINIC_OR_DEPARTMENT_OTHER)
Admission: EM | Admit: 2017-03-02 | Discharge: 2017-03-05 | DRG: 193 | Disposition: A | Discharge status: Home / Self Care | Payer: Medicare Other | Attending: Internal Medicine | Admitting: Internal Medicine

## 2017-03-02 ENCOUNTER — Encounter (HOSPITAL_BASED_OUTPATIENT_CLINIC_OR_DEPARTMENT_OTHER): Payer: Self-pay | Admitting: Emergency Medicine

## 2017-03-02 DIAGNOSIS — Z8249 Family history of ischemic heart disease and other diseases of the circulatory system: Secondary | ICD-10-CM

## 2017-03-02 DIAGNOSIS — J189 Pneumonia, unspecified organism: Secondary | ICD-10-CM | POA: Diagnosis not present

## 2017-03-02 DIAGNOSIS — F329 Major depressive disorder, single episode, unspecified: Secondary | ICD-10-CM | POA: Diagnosis not present

## 2017-03-02 DIAGNOSIS — I255 Ischemic cardiomyopathy: Secondary | ICD-10-CM | POA: Diagnosis not present

## 2017-03-02 DIAGNOSIS — J441 Chronic obstructive pulmonary disease with (acute) exacerbation: Secondary | ICD-10-CM

## 2017-03-02 DIAGNOSIS — J440 Chronic obstructive pulmonary disease with acute lower respiratory infection: Secondary | ICD-10-CM | POA: Diagnosis present

## 2017-03-02 DIAGNOSIS — J9601 Acute respiratory failure with hypoxia: Secondary | ICD-10-CM | POA: Diagnosis not present

## 2017-03-02 DIAGNOSIS — J13 Pneumonia due to Streptococcus pneumoniae: Secondary | ICD-10-CM | POA: Diagnosis present

## 2017-03-02 DIAGNOSIS — E785 Hyperlipidemia, unspecified: Secondary | ICD-10-CM | POA: Diagnosis present

## 2017-03-02 DIAGNOSIS — I130 Hypertensive heart and chronic kidney disease with heart failure and stage 1 through stage 4 chronic kidney disease, or unspecified chronic kidney disease: Secondary | ICD-10-CM | POA: Diagnosis present

## 2017-03-02 DIAGNOSIS — Z7951 Long term (current) use of inhaled steroids: Secondary | ICD-10-CM

## 2017-03-02 DIAGNOSIS — T380X5A Adverse effect of glucocorticoids and synthetic analogues, initial encounter: Secondary | ICD-10-CM | POA: Diagnosis present

## 2017-03-02 DIAGNOSIS — Z833 Family history of diabetes mellitus: Secondary | ICD-10-CM

## 2017-03-02 DIAGNOSIS — F419 Anxiety disorder, unspecified: Secondary | ICD-10-CM | POA: Diagnosis present

## 2017-03-02 DIAGNOSIS — I420 Dilated cardiomyopathy: Secondary | ICD-10-CM

## 2017-03-02 DIAGNOSIS — Z79899 Other long term (current) drug therapy: Secondary | ICD-10-CM

## 2017-03-02 DIAGNOSIS — Z87442 Personal history of urinary calculi: Secondary | ICD-10-CM

## 2017-03-02 DIAGNOSIS — Z7984 Long term (current) use of oral hypoglycemic drugs: Secondary | ICD-10-CM

## 2017-03-02 DIAGNOSIS — Z955 Presence of coronary angioplasty implant and graft: Secondary | ICD-10-CM

## 2017-03-02 DIAGNOSIS — E119 Type 2 diabetes mellitus without complications: Secondary | ICD-10-CM | POA: Diagnosis not present

## 2017-03-02 DIAGNOSIS — Z951 Presence of aortocoronary bypass graft: Secondary | ICD-10-CM

## 2017-03-02 DIAGNOSIS — F32A Depression, unspecified: Secondary | ICD-10-CM | POA: Diagnosis present

## 2017-03-02 DIAGNOSIS — J449 Chronic obstructive pulmonary disease, unspecified: Secondary | ICD-10-CM | POA: Diagnosis present

## 2017-03-02 DIAGNOSIS — H3530 Unspecified macular degeneration: Secondary | ICD-10-CM | POA: Diagnosis present

## 2017-03-02 DIAGNOSIS — F418 Other specified anxiety disorders: Secondary | ICD-10-CM | POA: Diagnosis present

## 2017-03-02 DIAGNOSIS — E1151 Type 2 diabetes mellitus with diabetic peripheral angiopathy without gangrene: Secondary | ICD-10-CM | POA: Diagnosis present

## 2017-03-02 DIAGNOSIS — N181 Chronic kidney disease, stage 1: Secondary | ICD-10-CM | POA: Diagnosis present

## 2017-03-02 DIAGNOSIS — I252 Old myocardial infarction: Secondary | ICD-10-CM

## 2017-03-02 DIAGNOSIS — I2510 Atherosclerotic heart disease of native coronary artery without angina pectoris: Secondary | ICD-10-CM | POA: Diagnosis present

## 2017-03-02 DIAGNOSIS — Z7982 Long term (current) use of aspirin: Secondary | ICD-10-CM

## 2017-03-02 DIAGNOSIS — J9621 Acute and chronic respiratory failure with hypoxia: Secondary | ICD-10-CM | POA: Diagnosis present

## 2017-03-02 DIAGNOSIS — Z87891 Personal history of nicotine dependence: Secondary | ICD-10-CM

## 2017-03-02 DIAGNOSIS — E7800 Pure hypercholesterolemia, unspecified: Secondary | ICD-10-CM | POA: Diagnosis present

## 2017-03-02 HISTORY — DX: Heart failure, unspecified: I50.9

## 2017-03-02 HISTORY — DX: Unspecified macular degeneration: H35.30

## 2017-03-02 HISTORY — DX: Chronic obstructive pulmonary disease, unspecified: J44.9

## 2017-03-02 LAB — CBC WITH DIFFERENTIAL/PLATELET
BASOS ABS: 0 10*3/uL (ref 0.0–0.1)
Basophils Relative: 0 %
EOS PCT: 0 %
Eosinophils Absolute: 0 10*3/uL (ref 0.0–0.7)
HCT: 43.4 % (ref 39.0–52.0)
HEMOGLOBIN: 13.9 g/dL (ref 13.0–17.0)
LYMPHS ABS: 0.8 10*3/uL (ref 0.7–4.0)
LYMPHS PCT: 4 %
MCH: 30.2 pg (ref 26.0–34.0)
MCHC: 32 g/dL (ref 30.0–36.0)
MCV: 94.3 fL (ref 78.0–100.0)
Monocytes Absolute: 1.9 10*3/uL — ABNORMAL HIGH (ref 0.1–1.0)
Monocytes Relative: 9 %
Neutro Abs: 18.2 10*3/uL — ABNORMAL HIGH (ref 1.7–7.7)
Neutrophils Relative %: 87 %
Platelets: 196 10*3/uL (ref 150–400)
RBC: 4.6 MIL/uL (ref 4.22–5.81)
RDW: 13.9 % (ref 11.5–15.5)
WBC: 20.9 10*3/uL — AB (ref 4.0–10.5)

## 2017-03-02 LAB — BASIC METABOLIC PANEL
ANION GAP: 9 (ref 5–15)
BUN: 15 mg/dL (ref 6–20)
CHLORIDE: 104 mmol/L (ref 101–111)
CO2: 24 mmol/L (ref 22–32)
Calcium: 9.1 mg/dL (ref 8.9–10.3)
Creatinine, Ser: 1.01 mg/dL (ref 0.61–1.24)
GFR calc Af Amer: >60 mL/min (ref >60)
GLUCOSE: 167 mg/dL — AB (ref 65–99)
POTASSIUM: 4.2 mmol/L (ref 3.5–5.1)
Sodium: 137 mmol/L (ref 135–145)

## 2017-03-02 LAB — TROPONIN I: Troponin I: <0.03 ng/mL (ref <0.03)

## 2017-03-02 LAB — GLUCOSE, CAPILLARY
GLUCOSE-CAPILLARY: 206 mg/dL — AB (ref 65–99)
Glucose-Capillary: 278 mg/dL — ABNORMAL HIGH (ref 65–99)

## 2017-03-02 LAB — BRAIN NATRIURETIC PEPTIDE: B Natriuretic Peptide: 100.5 pg/mL — ABNORMAL HIGH (ref 0.0–100.0)

## 2017-03-02 MED ORDER — INSULIN ASPART 100 UNIT/ML ~~LOC~~ SOLN
0.0000 [IU] | Freq: Three times a day (TID) | SUBCUTANEOUS | Status: DC
  Administered 2017-03-02: 5 [IU] via SUBCUTANEOUS
  Administered 2017-03-03: 11 [IU] via SUBCUTANEOUS
  Administered 2017-03-03: 5 [IU] via SUBCUTANEOUS
  Administered 2017-03-03: 8 [IU] via SUBCUTANEOUS
  Administered 2017-03-04 (×3): 5 [IU] via SUBCUTANEOUS
  Administered 2017-03-05 (×2): 2 [IU] via SUBCUTANEOUS

## 2017-03-02 MED ORDER — DEXTROSE 5 % IV SOLN
1.0000 g | Freq: Once | INTRAVENOUS | Status: AC
  Administered 2017-03-02: 1 g via INTRAVENOUS
  Filled 2017-03-02: qty 10

## 2017-03-02 MED ORDER — SODIUM CHLORIDE 0.9% FLUSH
3.0000 mL | Freq: Two times a day (BID) | INTRAVENOUS | Status: DC
  Administered 2017-03-02 – 2017-03-05 (×5): 3 mL via INTRAVENOUS

## 2017-03-02 MED ORDER — AMLODIPINE BESYLATE 10 MG PO TABS
10.0000 mg | ORAL_TABLET | Freq: Every day | ORAL | Status: DC
Start: 2017-03-03 — End: 2017-03-05
  Administered 2017-03-03 – 2017-03-05 (×3): 10 mg via ORAL
  Filled 2017-03-02 (×4): qty 1

## 2017-03-02 MED ORDER — IPRATROPIUM-ALBUTEROL 0.5-2.5 (3) MG/3ML IN SOLN
3.0000 mL | RESPIRATORY_TRACT | Status: DC | PRN
Start: 2017-03-02 — End: 2017-03-03

## 2017-03-02 MED ORDER — HYDROCODONE-ACETAMINOPHEN 5-325 MG PO TABS: 1.0000 | ORAL_TABLET | ORAL | Status: DC | PRN

## 2017-03-02 MED ORDER — IPRATROPIUM-ALBUTEROL 0.5-2.5 (3) MG/3ML IN SOLN: 3.0000 mL | Freq: Four times a day (QID) | RESPIRATORY_TRACT | Status: DC

## 2017-03-02 MED ORDER — METHYLPREDNISOLONE SODIUM SUCC 125 MG IJ SOLR
60.0000 mg | Freq: Four times a day (QID) | INTRAMUSCULAR | Status: DC
  Administered 2017-03-02 – 2017-03-04 (×7): 60 mg via INTRAVENOUS
  Filled 2017-03-02 (×7): qty 2

## 2017-03-02 MED ORDER — ACETAMINOPHEN 325 MG PO TABS: 650.0000 mg | ORAL_TABLET | Freq: Four times a day (QID) | ORAL | Status: DC | PRN

## 2017-03-02 MED ORDER — SIMVASTATIN 20 MG PO TABS
20.0000 mg | ORAL_TABLET | Freq: Every day | ORAL | Status: DC
  Administered 2017-03-02 – 2017-03-04 (×3): 20 mg via ORAL
  Filled 2017-03-02 (×3): qty 1

## 2017-03-02 MED ORDER — BRIMONIDINE TARTRATE 0.2 % OP SOLN
1.0000 [drp] | Freq: Two times a day (BID) | OPHTHALMIC | Status: DC
  Filled 2017-03-02: qty 5

## 2017-03-02 MED ORDER — MOMETASONE FURO-FORMOTEROL FUM 200-5 MCG/ACT IN AERO
2.0000 | INHALATION_SPRAY | Freq: Two times a day (BID) | RESPIRATORY_TRACT | Status: DC
  Administered 2017-03-02 – 2017-03-05 (×6): 2 via RESPIRATORY_TRACT
  Filled 2017-03-02 (×2): qty 8.8

## 2017-03-02 MED ORDER — MULTIVITAMINS PO CAPS: 1.0000 | ORAL_CAPSULE | Freq: Every day | ORAL | Status: DC

## 2017-03-02 MED ORDER — ONDANSETRON HCL 4 MG/2ML IJ SOLN
4.0000 mg | Freq: Four times a day (QID) | INTRAMUSCULAR | Status: DC | PRN
Start: 2017-03-02 — End: 2017-03-05

## 2017-03-02 MED ORDER — SODIUM CHLORIDE 0.9 % IV SOLN: 250.0000 mL | INTRAVENOUS | Status: DC | PRN

## 2017-03-02 MED ORDER — ESCITALOPRAM OXALATE 10 MG PO TABS
10.0000 mg | ORAL_TABLET | Freq: Every day | ORAL | Status: DC
  Administered 2017-03-02 – 2017-03-05 (×4): 10 mg via ORAL
  Filled 2017-03-02 (×4): qty 1

## 2017-03-02 MED ORDER — METHYLPREDNISOLONE SODIUM SUCC 125 MG IJ SOLR
125.0000 mg | Freq: Once | INTRAMUSCULAR | Status: AC
  Administered 2017-03-02: 125 mg via INTRAVENOUS
  Filled 2017-03-02: qty 2

## 2017-03-02 MED ORDER — METOPROLOL SUCCINATE ER 25 MG PO TB24
25.0000 mg | ORAL_TABLET | Freq: Every day | ORAL | Status: DC
  Administered 2017-03-02 – 2017-03-05 (×4): 25 mg via ORAL
  Filled 2017-03-02 (×4): qty 1

## 2017-03-02 MED ORDER — ADULT MULTIVITAMIN W/MINERALS CH
1.0000 | ORAL_TABLET | Freq: Every day | ORAL | Status: DC
  Administered 2017-03-03 – 2017-03-05 (×3): 1 via ORAL
  Filled 2017-03-02 (×4): qty 1

## 2017-03-02 MED ORDER — INSULIN ASPART 100 UNIT/ML ~~LOC~~ SOLN
0.0000 [IU] | Freq: Every day | SUBCUTANEOUS | Status: DC
  Administered 2017-03-02: 3 [IU] via SUBCUTANEOUS
  Administered 2017-03-03: 2 [IU] via SUBCUTANEOUS

## 2017-03-02 MED ORDER — AZITHROMYCIN 500 MG IV SOLR
INTRAVENOUS | Status: AC
  Filled 2017-03-02: qty 500

## 2017-03-02 MED ORDER — ASPIRIN EC 81 MG PO TBEC
81.0000 mg | DELAYED_RELEASE_TABLET | Freq: Every day | ORAL | Status: DC
  Administered 2017-03-03 – 2017-03-05 (×3): 81 mg via ORAL
  Filled 2017-03-02 (×3): qty 1

## 2017-03-02 MED ORDER — DEXTROSE 5 % IV SOLN
500.0000 mg | INTRAVENOUS | Status: DC
  Administered 2017-03-03 – 2017-03-05 (×3): 500 mg via INTRAVENOUS
  Filled 2017-03-02 (×4): qty 500

## 2017-03-02 MED ORDER — ENOXAPARIN SODIUM 40 MG/0.4ML ~~LOC~~ SOLN
40.0000 mg | SUBCUTANEOUS | Status: DC
  Administered 2017-03-03 – 2017-03-05 (×3): 40 mg via SUBCUTANEOUS
  Filled 2017-03-02 (×3): qty 0.4

## 2017-03-02 MED ORDER — DEXTROSE 5 % IV SOLN
500.0000 mg | Freq: Once | INTRAVENOUS | Status: AC
  Administered 2017-03-02: 500 mg via INTRAVENOUS

## 2017-03-02 MED ORDER — DEXTROSE 5 % IV SOLN
1.0000 g | INTRAVENOUS | Status: DC
  Administered 2017-03-03 – 2017-03-05 (×3): 1 g via INTRAVENOUS
  Filled 2017-03-02 (×3): qty 10

## 2017-03-02 MED ORDER — SODIUM CHLORIDE 0.9% FLUSH: 3.0000 mL | INTRAVENOUS | Status: DC | PRN

## 2017-03-02 MED ORDER — IPRATROPIUM-ALBUTEROL 0.5-2.5 (3) MG/3ML IN SOLN
3.0000 mL | Freq: Four times a day (QID) | RESPIRATORY_TRACT | Status: DC
  Administered 2017-03-02 – 2017-03-03 (×4): 3 mL via RESPIRATORY_TRACT
  Filled 2017-03-02 (×4): qty 3

## 2017-03-02 MED ORDER — BRINZOLAMIDE 1 % OP SUSP
1.0000 [drp] | Freq: Two times a day (BID) | OPHTHALMIC | Status: DC
  Filled 2017-03-02: qty 10

## 2017-03-02 NOTE — ED Provider Notes (Signed)
[facility] DEPT [facility] Provider Note   CSN: [id number] Arrival date & time: 03/02/17  9622     History   Chief Complaint Chief Complaint  Patient presents with  . Shortness of Breath    HPI [name] is a 76 y.o. male.  This patient is a 76 year old male with extensive past medical history including coronary artery bypass graft 15 years ago, cardiomyopathy, COPD, and CHF. He presents for evaluation of difficulty breathing. He reports chest congestion and wheezing which has worsened over the past 7 days. He has been using his inhaler at home with some relief. He denies any fevers or chills. He does report some cough which is minimally productive. He denies any swelling of his legs and denies any chest pain.   The history is provided by the patient.  Shortness of Breath  This is a new problem. The average episode lasts 1 week. The problem occurs continuously.The problem has been gradually worsening. Associated symptoms include cough and sputum production. Pertinent negatives include no fever, no orthopnea and no chest pain. He has tried ipratropium inhalers for the symptoms. The treatment provided mild relief. Associated medical issues include COPD, CAD and heart failure.    Past Medical History:  Diagnosis Date  . Anxiety   . CAD (coronary artery disease)     s/p CABG with LIMA to LAD, SVG to diag, SVG to OM, SVG to RCA cath 2002 Dr. Radford Pax  . Cataract   . CHF (congestive heart failure) ([place])   . Chronic kidney disease   . Claudication ([place])    normal ABIs  . COPD (chronic obstructive pulmonary disease) ([place])   . Coronary atherosclerosis of native coronary artery 2002   s/p MI with Ischemic CM EF 40% with apical akinesis at cath 2002 intolerant to ACE inhibitors and ARBs - EF 47% by nuclear stress test 08/2010 Dr. Radford Pax   . Depression   . Diabetes mellitus without complication ([place])   . Dyslipidemia   . Hypercholesteremia   . Hypersomnia   . Hypertension   . Ischemic  dilated cardiomyopathy ([place])    EF 40% with apical AK at cath intolerant to ACE I and ARBS, EF 43% by nuclear stress test 08/2010  . Kidney stones   . Macular degeneration     Patient Active Problem List   Diagnosis Date Noted  . 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[place])   . Claudication ([place])   . Kidney stones   . Hypertension   . Coronary atherosclerosis of native coronary artery   . Depression   . Anxiety   . Hypersomnia   . Dyslipidemia     Past Surgical History:  Procedure Laterality Date  . CORONARY ARTERY BYPASS GRAFT     with LIMA to LAD, SVG to diag, SVG to OM, SVG to RCA cath 2002 Dr. Radford Pax  . urologic procedure for fertility         Home Medications    Prior to Admission medications   Medication Sig Start Date End Date Taking? Authorizing Provider  amLODipine (NORVASC) 10 MG tablet Take 1 tablet (10 mg total) by mouth daily. 02/27/16   Liliane Shi, PA-C  aspirin EC 81 MG tablet Take 81 mg by mouth daily.    Historical Provider, MD  escitalopram (LEXAPRO) 10 MG tablet Take 1 tablet by mouth daily. 09/06/13   Historical Provider, MD  metFORMIN (GLUCOPHAGE) 500 MG tablet Take by mouth 2 (two) times daily with a meal.    Historical Provider,  MD  metoprolol succinate (TOPROL-XL) 25 MG 24 hr tablet Take 1 tablet (25 mg total) by mouth daily. 02/27/16   Liliane Shi, PA-C  Multiple Vitamin (MULTIVITAMIN) capsule Take 1 capsule by mouth daily.    Historical Provider, MD  SIMBRINZA 1-0.2 % SUSP Place 1 drop into the left eye 2 (two) times daily.  11/22/14   Historical Provider, MD  simvastatin (ZOCOR) 20 MG tablet Take 1 tablet (20 mg total) by mouth daily at 6 PM. 02/27/16   Liliane Shi, PA-C  SYMBICORT 160-4.5 MCG/ACT inhaler Inhale 2 puffs into the lungs 2 (two) times daily.  01/28/16   Historical Provider, MD    Family History Family History  Problem Relation Age of Onset  . Hypertension Father   . Breast cancer Father   . CVA Father   . Diabetes Mother   .  Hypertension Mother   . CAD Sister   . Hypertension Sister   . Diabetes Sister   . Heart disease Brother   . Diabetes Brother   . Breast cancer Sister   . Hypertension Sister   . Diabetes Sister   . Heart disease Sister     Social History Social History  Substance Use Topics  . Smoking status: Former Smoker    Quit date: 10/25/2001  . Smokeless tobacco: Never Used  . Alcohol use Yes     Comment: occasional beer and wine     Allergies   Patient has no known allergies.   Review of Systems Review of Systems  Constitutional: Negative for fever.  Respiratory: Positive for cough, sputum production and shortness of breath.   Cardiovascular: Negative for chest pain and orthopnea.  All other systems reviewed and are negative.    Physical Exam Updated Vital Signs BP 127/62 (BP Location: Left Arm)   Pulse (!) 120   Temp 98.7 F (37.1 C) (Oral)   Resp 18   Ht 5\' 7"  (1.702 m)   Wt 230 lb (104.3 kg)   SpO2 93%   BMI 36.02 kg/m   Physical Exam  Constitutional: He is oriented to person, place, and time. He appears well-developed and well-nourished. No distress.  HENT:  Head: Normocephalic and atraumatic.  Mouth/Throat: Oropharynx is clear and moist.  Neck: Normal range of motion. Neck supple.  Cardiovascular: Normal rate and regular rhythm.  Exam reveals no friction rub.   No murmur heard. Pulmonary/Chest: Effort normal. No respiratory distress. He has wheezes. He has no rales.  There is bilateral expiratory wheezing present.  Abdominal: Soft. Bowel sounds are normal. He exhibits no distension. There is no tenderness.  Musculoskeletal: Normal range of motion. He exhibits no edema.  There is no calf tenderness or swelling. Homans sign is absent bilaterally.  Neurological: He is alert and oriented to person, place, and time. Coordination normal.  Skin: Skin is warm and dry. He is not diaphoretic.  Nursing note and vitals reviewed.    ED Treatments / Results   Labs (all labs ordered are listed, but only abnormal results are displayed) Labs Reviewed  CBC WITH DIFFERENTIAL/PLATELET - Abnormal; Notable for the following:       Result Value   WBC 20.9 (*)    Neutro Abs 18.2 (*)    Monocytes Absolute 1.9 (*)    All other components within normal limits  BASIC METABOLIC PANEL - Abnormal; Notable for the following:    Glucose, Bld 167 (*)    All other components within normal limits  TROPONIN I  BRAIN NATRIURETIC PEPTIDE    EKG  EKG Interpretation  Date/Time:  Sunday March 02 2017 10:03:09 EDT Ventricular Rate:  127 PR Interval:    QRS Duration: 76 QT Interval:  305 QTC Calculation: 444 R Axis:   -4 Text Interpretation:  Sinus tachycardia Low voltage, precordial leads Abnormal T, consider ischemia, lateral leads Confirmed by Genesia Caslin  MD, Janus Vlcek ([id number]) on 03/02/2017 11:46:05 AM       Radiology Dg Chest 2 View  Result Date: 03/02/2017 CLINICAL DATA:  Worsening shortness of breath over the last several days. History of COPD. EXAM: CHEST  2 VIEW COMPARISON:  09/16/2014 FINDINGS: Previous median sternotomy and CABG. Worsened patchy density in both lower lobes consistent with atelectasis or pneumonia. Upper lungs remain clear. No evidence of heart failure or effusion. IMPRESSION: Previous CABG. Worsened patchy density in both lower lobes suggesting mild basilar pneumonia. Electronically Signed   By: Nelson Chimes M.D.   On: 03/02/2017 11:34    Procedures Procedures (including critical care time)  Medications Ordered in ED Medications  ipratropium-albuterol (DUONEB) 0.5-2.5 (3) MG/3ML nebulizer solution 3 mL (3 mLs Nebulization Given 03/02/17 1127)  azithromycin (ZITHROMAX) 500 mg in dextrose 5 % 250 mL IVPB (not administered)  cefTRIAXone (ROCEPHIN) 1 g in dextrose 5 % 50 mL IVPB (not administered)  methylPREDNISolone sodium succinate (SOLU-MEDROL) 125 mg/2 mL injection 125 mg (125 mg Intravenous Given 03/02/17 1118)     Initial Impression  / Assessment and Plan / ED Course  I have reviewed the triage vital signs and the nursing notes.  Pertinent labs & imaging results that were available during my care of the patient were reviewed by me and considered in my medical decision making (see chart for details).  Patient with extensive past medical history including CHF and COPD. He presents with shortness of breath, cough, and fatigue it is been occurring over the past 7 days. His workup today reveals a white count of 21,000, what appear to be bilateral infiltrates on his chest x-ray, and he remains tachycardic with a heart rate in the 110s.  This appears to be pneumonia with a COPD exacerbation. He was given steroids and antibiotics in the emergency department. I spoken with Dr. Aggie Moats who agrees to admit the patient.  Final Clinical Impressions(s) / ED Diagnoses   Final diagnoses:  None    New Prescriptions New Prescriptions   No medications on file     Veryl Speak, MD 03/02/17 1524

## 2017-03-02 NOTE — H&P (Signed)
History and Physical    [name]:[id number] DOB: [birth year] DOA: 03/02/2017  PCP: Kandice Hams, MD   Patient coming from: Home, by way of [facility]  Chief Complaint: SOB, cough, malaise   HPI: [name] is a 76 y.o. male with medical history significant for COPD, coronary artery disease with history of CABG, dilated ischemic cardiomyopathy, type 2 diabetes mellitus, and depression with anxiety who presents in transfer from [facility] where he was seen for progressive dyspnea and cough. Patient gives a history of COPD not requiring supplemental oxygen at home and with no recent use of antibiotics or steroids. He reports the insidious development of progressive exertional dyspnea and increased cough going back approximately one month. He reports that his condition has waxed and waned, but overall has worsened over the past month. He reports more rapid progression over the past few days and became acutely dyspneic last night, working to breathe while at rest. He denies fevers or chills and describes his cough as only occasionally productive of scant thick sputum. He denies chest pain or palpitations and denies any significant swelling or orthopnea. There's been no recent long distance travel, sick contacts, or swelling or tenderness in lower extremity. He has been using his home inhalers with only minimal relief.   [facility] ED Course: Upon arrival to the [facility] ED, patient is found to be afebrile, saturating 82% on room air, mildly tachypneic, tachycardic in the 130s, and with stable blood pressure. EKG features a sinus tachycardia with rate 127 and T-wave inversion in aVL. Chest x-ray features worsening patchy density bilateral lower lobes concerning for mild basilar pneumonia. Chemistry panel is unremarkable and CBC is notable for a leukocytosis to 20,900. BNP is only mildly elevated and troponin is undetectable. The patient was treated with DuoNeb, 125 mg IV  Solu-Medrol, Rocephin, and azithromycin. He improved with the breathing treatments in the ED, but continues to require supplemental oxygen while at rest and has been transferred to [facility] for admission to the telemetry unit for ongoing evaluation and management of acute hypoxic respiratory failure secondary to pneumonia with acute exacerbation and COPD.  Review of Systems:  All other systems reviewed and apart from HPI, are negative.  Past Medical History:  Diagnosis Date  . Anxiety   . CAD (coronary artery disease)     s/p CABG with LIMA to LAD, SVG to diag, SVG to OM, SVG to RCA cath 2002 Dr. Radford Pax  . Cataract   . CHF (congestive heart failure) ([place])   . Chronic kidney disease   . Claudication ([place])    normal ABIs  . COPD (chronic obstructive pulmonary disease) ([place])   . Coronary atherosclerosis of native coronary artery 2002   s/p MI with Ischemic CM EF 40% with apical akinesis at cath 2002 intolerant to ACE inhibitors and ARBs - EF 47% by nuclear stress test 08/2010 Dr. Radford Pax   . Depression   . Diabetes mellitus without complication ([place])   . Dyslipidemia   . Hypercholesteremia   . Hypersomnia   . Hypertension   . 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[place])    EF 40% with apical AK at cath intolerant to ACE I and ARBS, EF 43% by nuclear stress test 08/2010  . Kidney stones   . Macular degeneration     Past Surgical History:  Procedure Laterality Date  . CORONARY ARTERY BYPASS GRAFT     with LIMA to LAD, SVG to diag, SVG to OM,  SVG to RCA cath 2002 Dr. Radford Pax  . urologic procedure for fertility       reports that he quit smoking about 15 years ago. He has never used smokeless tobacco. He reports that he drinks alcohol. He reports that he does not use drugs.  No Known Allergies  Family History  Problem Relation Age of Onset  . Hypertension Father   . Breast cancer Father   . CVA Father   . Diabetes Mother   . Hypertension Mother   . CAD Sister   .  Hypertension Sister   . Diabetes Sister   . Heart disease Brother   . Diabetes Brother   . Breast cancer Sister   . Hypertension Sister   . Diabetes Sister   . Heart disease Sister      Prior to Admission medications   Medication Sig Start Date End Date Taking? Authorizing Provider  albuterol (PROVENTIL HFA;VENTOLIN HFA) 108 (90 Base) MCG/ACT inhaler Inhale 1-2 puffs into the lungs every 6 (six) hours as needed for wheezing or shortness of breath.   Yes Historical Provider, MD  amLODipine (NORVASC) 10 MG tablet Take 1 tablet (10 mg total) by mouth daily. 02/27/16  Yes Liliane Shi, PA-C  aspirin EC 81 MG tablet Take 81 mg by mouth daily.   Yes Historical Provider, MD  Besifloxacin HCl (BESIVANCE) 0.6 % SUSP Apply 4 drops to eye. 4 drops in each eye twice a month. After eye injection.   Yes Historical Provider, MD  escitalopram (LEXAPRO) 10 MG tablet Take 1 tablet by mouth daily. 09/06/13  Yes Historical Provider, MD  metFORMIN (GLUCOPHAGE) 500 MG tablet Take by mouth 2 (two) times daily with a meal.   Yes Historical Provider, MD  metoprolol succinate (TOPROL-XL) 25 MG 24 hr tablet Take 1 tablet (25 mg total) by mouth daily. 02/27/16  Yes Liliane Shi, PA-C  Multiple Vitamin (MULTIVITAMIN) capsule Take 1 capsule by mouth daily.   Yes Historical Provider, MD  SIMBRINZA 1-0.2 % SUSP Place 1 drop into the left eye 2 (two) times daily.  11/22/14  Yes Historical Provider, MD  simvastatin (ZOCOR) 20 MG tablet Take 1 tablet (20 mg total) by mouth daily at 6 PM. 02/27/16  Yes Liliane Shi, PA-C  SYMBICORT 160-4.5 MCG/ACT inhaler Inhale 2 puffs into the lungs 2 (two) times daily.  01/28/16  Yes Historical Provider, MD    Physical Exam: Vitals:   03/02/17 1600 03/02/17 1630 03/02/17 1638 03/02/17 1759  BP: 126/78 134/82 134/82 (!) 150/79  Pulse: (!) 110 (!) 111 (!) 111 (!) 105  Resp: 20 19 20 18   Temp:   98.1 F (36.7 C) 98.2 F (36.8 C)  TempSrc:   Oral Oral  SpO2: 95% 97% 96% 98%  Weight:       Height:          Constitutional: Not in acute distress, calm, comfortable Eyes: PERTLA, lids and conjunctivae normal ENMT: Mucous membranes are moist. Posterior pharynx clear of any exudate or lesions.   Neck: normal, supple, no masses, no thyromegaly Respiratory: Mildly diminished bilaterally, inspiratory & expiratory wheezes, rhonchi at left base. Normal respiratory effort. No accessory muscle use.  Cardiovascular: Rate ~110 and regular. Trace pretibial edema bilaterally. No significant JVD. Abdomen: No distension, no tenderness, no masses palpated. Bowel sounds normal.  Musculoskeletal: no clubbing / cyanosis. No joint deformity upper and lower extremities. Normal muscle tone.  Skin: no significant rashes, lesions, ulcers. Warm, dry, well-perfused. Neurologic: CN 2-12 grossly intact.  Sensation intact, DTR normal. Strength 5/5 in all 4 limbs.  Psychiatric: Normal judgment and insight. Alert and oriented x 3. Normal mood and affect.     Labs on Admission: I have personally reviewed following labs and imaging studies  CBC:  Recent Labs Lab 03/02/17 1008  WBC 20.9*  NEUTROABS 18.2*  HGB 13.9  HCT 43.4  MCV 94.3  PLT 353   Basic Metabolic Panel:  Recent Labs Lab 03/02/17 1008  NA 137  K 4.2  CL 104  CO2 24  GLUCOSE 167*  BUN 15  CREATININE 1.01  CALCIUM 9.1   GFR: Estimated Creatinine Clearance: 75 mL/min (by C-G formula based on SCr of 1.01 mg/dL). Liver Function Tests: No results for input(s): AST, ALT, ALKPHOS, BILITOT, PROT, ALBUMIN in the last 168 hours. No results for input(s): LIPASE, AMYLASE in the last 168 hours. No results for input(s): AMMONIA in the last 168 hours. Coagulation Profile: No results for input(s): INR, PROTIME in the last 168 hours. Cardiac Enzymes:  Recent Labs Lab 03/02/17 1008  TROPONINI <0.03   BNP (last 3 results) No results for input(s): PROBNP in the last 8760 hours. HbA1C: No results for input(s): HGBA1C in the last  72 hours. CBG:  Recent Labs Lab 03/02/17 1807  GLUCAP 206*   Lipid Profile: No results for input(s): CHOL, HDL, LDLCALC, TRIG, CHOLHDL, LDLDIRECT in the last 72 hours. Thyroid Function Tests: No results for input(s): TSH, T4TOTAL, FREET4, T3FREE, THYROIDAB in the last 72 hours. Anemia Panel: No results for input(s): VITAMINB12, FOLATE, FERRITIN, TIBC, IRON, RETICCTPCT in the last 72 hours. Urine analysis: No results found for: COLORURINE, APPEARANCEUR, LABSPEC, PHURINE, GLUCOSEU, HGBUR, BILIRUBINUR, KETONESUR, PROTEINUR, UROBILINOGEN, NITRITE, LEUKOCYTESUR Sepsis Labs: @LABRCNTIP (procalcitonin:4,lacticidven:4) )No results found for this or any previous visit (from the past 240 hour(s)).   Radiological Exams on Admission: Dg Chest 2 View  Result Date: 03/02/2017 CLINICAL DATA:  Worsening shortness of breath over the last several days. History of COPD. EXAM: CHEST  2 VIEW COMPARISON:  09/16/2014 FINDINGS: Previous median sternotomy and CABG. Worsened patchy density in both lower lobes consistent with atelectasis or pneumonia. Upper lungs remain clear. No evidence of heart failure or effusion. IMPRESSION: Previous CABG. Worsened patchy density in both lower lobes suggesting mild basilar pneumonia. Electronically Signed   By: Nelson Chimes M.D.   On: 03/02/2017 11:34    EKG: Independently reviewed. Sinus tachycardia (rate 127), T-wave inverted in aVL  Assessment/Plan  1. Community-acquired PNA with acute hypoxic respiratory failure  - Pt presented [facility] ED with progressive dyspnea and cough, found to be in acute distress with sat 82% on rm air (no baseline supplemental O2 requirement)  - There is marked leukocytosis in absence of recent steroid, and CXR-findings consistent with PNA - He was started on empiric abx with Rocephin and azithromycin  - Continue abx, treat associated exacerbation in COPD, and wean O2 as tolerated    2. COPD with acute exacerbation  - Pt presented to [facility] in  acute distress with hypoxia  - He has made dramatic improvement with IV Solu-Medrol, neb treatments, supplemental O2   - Continue systemic steroid, nebs, abx as above, supplemental O2, continue scheduled Dulera in place of his Symbicort    3. Ischemic cardiomyopathy  - Has been well-compensated, noted to have EF 43% on most recent nuc med study  - Not requiring diuretic at home - Continue Toprol, ASA 81; has been intolerant of ACEi and ARB per cardiology notes   4. CAD - No  anginal complaints  - Troponin undetectable  - Monitoring on telemetry  - Continue Toprol, Zocor, and ASA 81  5. Depression with anxiety  - Appears to be stable on admission  - Continue Lexapro   6. Type II DM  - No A1c on file; managed with metformin only at home, held on admission  - He will be on systemic steroid  - Check CBG with meals and qHS  - Start a moderate-intensity Novolog correctional    DVT prophylaxis: sq Lovenox  Code Status: Full  Family Communication: Wife updated at bedside Disposition Plan: Observe on telemetry Consults called: None Admission status: Observation    Vianne Bulls, MD [facility] Pager [phone]  If 7PM-7AM, please contact night-coverage [email or web address] Password [facility]  03/02/2017, 7:24 PM

## 2017-03-02 NOTE — Progress Notes (Signed)
Arrived from [facility] via [facility] in telemetry bed.Awake ,alert and oriented x 4,with oxygen at 3 lpm/.Patient ambulated with steady gait.No skin issue.

## 2017-03-02 NOTE — ED Notes (Signed)
Dr Stark Jock in room with pt now.

## 2017-03-02 NOTE — ED Triage Notes (Signed)
Pt reports increased SOB over past few days, worse last night with minimal relief from rescue inhaler.  Pt states this morning he feels slightly better than last night but with continued SOB. Pt h/o COPD.  Pt denies CP.

## 2017-03-02 NOTE — ED Notes (Addendum)
Report called to Gwenlyn Perking, inpatient RN

## 2017-03-02 NOTE — ED Notes (Signed)
[facility] called and informed of a long wait time. HP 1 en route for patient at this time.

## 2017-03-02 NOTE — Plan of Care (Signed)
76yo M with pmhx of macular degeneration, kidney stones, cardio myopathy, hypertension, hypersomnia, diabetes, depression, COPD, claudication, chronic kidney disease presented with chief complaint of cough that was productive and shortness of breath.  ED course: Chest x-ray showed basilar pneumonia. Given azithromycin and Rocephin. Hospitalist was consulted for admission.   Status: tele obs  Elwin Mocha MD

## 2017-03-03 DIAGNOSIS — F418 Other specified anxiety disorders: Secondary | ICD-10-CM | POA: Diagnosis present

## 2017-03-03 DIAGNOSIS — J9621 Acute and chronic respiratory failure with hypoxia: Secondary | ICD-10-CM

## 2017-03-03 DIAGNOSIS — Z87891 Personal history of nicotine dependence: Secondary | ICD-10-CM | POA: Diagnosis not present

## 2017-03-03 DIAGNOSIS — I2510 Atherosclerotic heart disease of native coronary artery without angina pectoris: Secondary | ICD-10-CM | POA: Diagnosis present

## 2017-03-03 DIAGNOSIS — I252 Old myocardial infarction: Secondary | ICD-10-CM | POA: Diagnosis not present

## 2017-03-03 DIAGNOSIS — Z7951 Long term (current) use of inhaled steroids: Secondary | ICD-10-CM | POA: Diagnosis not present

## 2017-03-03 DIAGNOSIS — Z951 Presence of aortocoronary bypass graft: Secondary | ICD-10-CM | POA: Diagnosis not present

## 2017-03-03 DIAGNOSIS — J9601 Acute respiratory failure with hypoxia: Secondary | ICD-10-CM | POA: Diagnosis not present

## 2017-03-03 DIAGNOSIS — Z7982 Long term (current) use of aspirin: Secondary | ICD-10-CM | POA: Diagnosis not present

## 2017-03-03 DIAGNOSIS — Z79899 Other long term (current) drug therapy: Secondary | ICD-10-CM | POA: Diagnosis not present

## 2017-03-03 DIAGNOSIS — J189 Pneumonia, unspecified organism: Secondary | ICD-10-CM | POA: Diagnosis present

## 2017-03-03 DIAGNOSIS — E785 Hyperlipidemia, unspecified: Secondary | ICD-10-CM | POA: Diagnosis present

## 2017-03-03 DIAGNOSIS — J440 Chronic obstructive pulmonary disease with acute lower respiratory infection: Secondary | ICD-10-CM | POA: Diagnosis present

## 2017-03-03 DIAGNOSIS — I130 Hypertensive heart and chronic kidney disease with heart failure and stage 1 through stage 4 chronic kidney disease, or unspecified chronic kidney disease: Secondary | ICD-10-CM | POA: Diagnosis present

## 2017-03-03 DIAGNOSIS — N181 Chronic kidney disease, stage 1: Secondary | ICD-10-CM | POA: Diagnosis present

## 2017-03-03 DIAGNOSIS — J13 Pneumonia due to Streptococcus pneumoniae: Secondary | ICD-10-CM | POA: Diagnosis present

## 2017-03-03 DIAGNOSIS — E7800 Pure hypercholesterolemia, unspecified: Secondary | ICD-10-CM | POA: Diagnosis present

## 2017-03-03 DIAGNOSIS — Z7984 Long term (current) use of oral hypoglycemic drugs: Secondary | ICD-10-CM | POA: Diagnosis not present

## 2017-03-03 DIAGNOSIS — I255 Ischemic cardiomyopathy: Secondary | ICD-10-CM | POA: Diagnosis present

## 2017-03-03 DIAGNOSIS — E1151 Type 2 diabetes mellitus with diabetic peripheral angiopathy without gangrene: Secondary | ICD-10-CM | POA: Diagnosis present

## 2017-03-03 DIAGNOSIS — T380X5A Adverse effect of glucocorticoids and synthetic analogues, initial encounter: Secondary | ICD-10-CM | POA: Diagnosis present

## 2017-03-03 DIAGNOSIS — J441 Chronic obstructive pulmonary disease with (acute) exacerbation: Secondary | ICD-10-CM | POA: Diagnosis present

## 2017-03-03 DIAGNOSIS — Z87442 Personal history of urinary calculi: Secondary | ICD-10-CM | POA: Diagnosis not present

## 2017-03-03 DIAGNOSIS — H3530 Unspecified macular degeneration: Secondary | ICD-10-CM | POA: Diagnosis present

## 2017-03-03 DIAGNOSIS — I420 Dilated cardiomyopathy: Secondary | ICD-10-CM | POA: Diagnosis present

## 2017-03-03 DIAGNOSIS — Z955 Presence of coronary angioplasty implant and graft: Secondary | ICD-10-CM | POA: Diagnosis not present

## 2017-03-03 HISTORY — DX: Pneumonia due to Streptococcus pneumoniae: J13

## 2017-03-03 LAB — RESPIRATORY PANEL BY PCR
Adenovirus: NOT DETECTED
BORDETELLA PERTUSSIS-RVPCR: NOT DETECTED
CHLAMYDOPHILA PNEUMONIAE-RVPPCR: NOT DETECTED
CORONAVIRUS HKU1-RVPPCR: NOT DETECTED
Coronavirus 229E: NOT DETECTED
Coronavirus NL63: NOT DETECTED
Coronavirus OC43: NOT DETECTED
INFLUENZA A-RVPPCR: NOT DETECTED
Influenza B: NOT DETECTED
METAPNEUMOVIRUS-RVPPCR: NOT DETECTED
Mycoplasma pneumoniae: NOT DETECTED
PARAINFLUENZA VIRUS 2-RVPPCR: NOT DETECTED
PARAINFLUENZA VIRUS 3-RVPPCR: NOT DETECTED
PARAINFLUENZA VIRUS 4-RVPPCR: NOT DETECTED
Parainfluenza Virus 1: NOT DETECTED
RHINOVIRUS / ENTEROVIRUS - RVPPCR: NOT DETECTED
Respiratory Syncytial Virus: NOT DETECTED

## 2017-03-03 LAB — GLUCOSE, CAPILLARY
GLUCOSE-CAPILLARY: 216 mg/dL — AB (ref 65–99)
GLUCOSE-CAPILLARY: 221 mg/dL — AB (ref 65–99)
Glucose-Capillary: 350 mg/dL — ABNORMAL HIGH (ref 65–99)
Glucose-Capillary: 271 mg/dL — ABNORMAL HIGH (ref 65–99)

## 2017-03-03 LAB — STREP PNEUMONIAE URINARY ANTIGEN: STREP PNEUMO URINARY ANTIGEN: NEGATIVE

## 2017-03-03 LAB — HEMOGLOBIN A1C
Hgb A1c MFr Bld: 6.1 % — ABNORMAL HIGH (ref 4.8–5.6)
Mean Plasma Glucose: 128 mg/dL

## 2017-03-03 MED ORDER — IPRATROPIUM-ALBUTEROL 0.5-2.5 (3) MG/3ML IN SOLN: 3.0000 mL | Freq: Four times a day (QID) | RESPIRATORY_TRACT | Status: DC | PRN

## 2017-03-03 MED ORDER — BRINZOLAMIDE-BRIMONIDINE 1-0.2 % OP SUSP
1.0000 [drp] | Freq: Two times a day (BID) | OPHTHALMIC | Status: DC
  Administered 2017-03-03 – 2017-03-05 (×4): 1 [drp] via OPHTHALMIC

## 2017-03-03 MED ORDER — GUAIFENESIN-DM 100-10 MG/5ML PO SYRP: 5.0000 mL | ORAL_SOLUTION | ORAL | Status: DC | PRN

## 2017-03-03 NOTE — Care Management Obs Status (Signed)
[place] NOTIFICATION   Patient Details  Name: [name] MRN: [id number] Date of Birth: [birth year]   Medicare Observation Status Notification Given:  Yes    Fredia Chittenden, Rory Percy, RN 03/03/2017, 11:10 AM

## 2017-03-03 NOTE — Progress Notes (Signed)
[facility] PROGRESS NOTE  [name] JYN:[id number] DOB: [birth year] DOA: 03/02/2017   PCP: Kandice Hams, MD     Assessment/Plan: Principal Problem:   Pneumonia Active Problems:   Coronary atherosclerosis of native coronary artery   Depression   Anxiety   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[place])   COPD with acute exacerbation ([place])   Acute on chronic respiratory failure with hypoxia (HCC)   Diabetes mellitus type II, non insulin dependent ([place])   Acute respiratory failure with hypoxia ([place])   76 y.o. male with medical history significant for COPD, coronary artery disease with history of CABG, dilated ischemic cardiomyopathy, type 2 diabetes mellitus, and depression with anxiety who presents in transfer from [facility] where he was seen for progressive dyspnea and cough. Patient gives a history of COPD not requiring supplemental oxygen at home and with no recent use of antibiotics or steroids.Chest x-ray features worsening patchy density bilateral lower lobes concerning for mild basilar pneumonia.transferred to [facility] for admission to the telemetry unit for ongoing evaluation and management of acute hypoxic respiratory failure secondary to pneumonia with acute exacerbation and COPD  Assessment and plan 1. Community-acquired PNA with acute hypoxic respiratory failure  - Pt presented [facility] ED with progressive dyspnea and cough, found to be in acute distress with sat 82% on rm air (no baseline supplemental O2 requirement)  - White blood cell count 20.9, and CXR-findings consistent with PNA - Continue empiric abx with Rocephin and azithromycin, day #2  - Continue abx, treat associated exacerbation in COPD, and wean O2 as tolerated   Follow sputum culture, Strep pneumo urinary antigen negative Check HIV antibody Requiring 3L of oxygen and does not use oxygen at baseline  2. COPD with acute exacerbation  - Pt presented to [facility] in acute distress  with hypoxia  - Improved with IV Solu-Medrol, neb treatments, supplemental O2   - Continue systemic steroid, nebs, abx as above, supplemental O2, continue scheduled Dulera in place of his Symbicort    3. Ischemic cardiomyopathy  - Has been well-compensated, noted to have EF 43% on most recent nuc med study  - Not requiring diuretic at home - Continue Toprol, ASA 81; has been intolerant of ACEi and ARB per cardiology notes   4. CAD - No anginal complaints  - Troponin undetectable  - Monitoring on telemetry  - Continue Toprol, Zocor, and ASA 81  5. Depression with anxiety  - Appears to be stable on admission  - Continue Lexapro   6. Type II DM  Check hemoglobin A1c; managed with metformin only at home, held on admission  - He will be on systemic steroid  - Check CBG with meals and qHS  - Start a moderate-intensity Novolog correctional     DVT prophylaxsis Lovenox  Code Status:  Full code   Family Communication: Discussed in detail with the patient, all imaging results, lab results explained to the patient   Disposition Plan: change to inpatient , high oxygen requirements , needs iv steroids       Consultants:  None  Procedures:   None  Antibiotics: Anti-infectives    Start     Dose/Rate Route Frequency Ordered Stop   03/03/17 1300  azithromycin (ZITHROMAX) 500 mg in dextrose 5 % 250 mL IVPB     500 mg 250 mL/hr over 60 Minutes Intravenous Every 24 hours 03/02/17 1831 03/09/17 1259   03/03/17 1200  cefTRIAXone (ROCEPHIN) 1 g in dextrose 5 %  50 mL IVPB     1 g 100 mL/hr over 30 Minutes Intravenous Every 24 hours 03/02/17 1831 03/09/17 1159   03/02/17 1237  azithromycin (ZITHROMAX) 500 MG injection    Comments:  [name], [name]   : cabinet override      03/02/17 1237 03/03/17 0044   03/02/17 1145  azithromycin (ZITHROMAX) 500 mg in dextrose 5 % 250 mL IVPB     500 mg 250 mL/hr over 60 Minutes Intravenous  Once 03/02/17 1139 03/02/17 1344   03/02/17 1145   cefTRIAXone (ROCEPHIN) 1 g in dextrose 5 % 50 mL IVPB     1 g 100 mL/hr over 30 Minutes Intravenous  Once 03/02/17 1139 03/02/17 1217         HPI/Subjective: Still sob, productive cough  Objective: Vitals:   03/02/17 2005 03/02/17 2041 03/03/17 0126 03/03/17 0456  BP:  (!) 137/54  132/68  Pulse:  (!) 117  (!) 102  Resp:  17  18  Temp:  98.7 F (37.1 C)  97.9 F (36.6 C)  TempSrc:      SpO2: 96% 93%  92%  Weight:   104.3 kg (229 lb 15 oz)   Height:        Intake/Output Summary (Last 24 hours) at 03/03/17 0857 Last data filed at 03/03/17 0600  Gross per 24 hour  Intake              660 ml  Output                0 ml  Net              660 ml    Exam:     General exam: Appears calm and comfortable  Respiratory: Mildly diminished bilaterally, inspiratory & expiratory wheezes, rhonchi at left base. Normal respiratory effort. No accessory muscle use.  Cardiovascular system: S1 & S2 heard, RRR. No JVD, murmurs, rubs, gallops or clicks. No pedal edema. Gastrointestinal system: Abdomen is nondistended, soft and nontender. No organomegaly or masses felt. Normal bowel sounds heard. Central nervous system: Alert and oriented. No focal neurological deficits. Extremities: Symmetric 5 x 5 power. Skin: No rashes, lesions or ulcers Psychiatry: Judgement and insight appear normal. Mood & affect appropriate.     Data Reviewed: I have personally reviewed following labs and imaging studies  Micro Results No results found for this or any previous visit (from the past 240 hour(s)).  Radiology Reports Dg Chest 2 View  Result Date: 03/02/2017 CLINICAL DATA:  Worsening shortness of breath over the last several days. History of COPD. EXAM: CHEST  2 VIEW COMPARISON:  09/16/2014 FINDINGS: Previous median sternotomy and CABG. Worsened patchy density in both lower lobes consistent with atelectasis or pneumonia. Upper lungs remain clear. No evidence of heart failure or effusion. IMPRESSION:  Previous CABG. Worsened patchy density in both lower lobes suggesting mild basilar pneumonia. Electronically Signed   By: Nelson Chimes M.D.   On: 03/02/2017 11:34     CBC  Recent Labs Lab 03/02/17 1008  WBC 20.9*  HGB 13.9  HCT 43.4  PLT 196  MCV 94.3  MCH 30.2  MCHC 32.0  RDW 13.9  LYMPHSABS 0.8  MONOABS 1.9*  EOSABS 0.0  BASOSABS 0.0    Chemistries   Recent Labs Lab 03/02/17 1008  NA 137  K 4.2  CL 104  CO2 24  GLUCOSE 167*  BUN 15  CREATININE 1.01  CALCIUM 9.1   ------------------------------------------------------------------------------------------------------------------ estimated creatinine clearance is 75  mL/min (by C-G formula based on SCr of 1.01 mg/dL). ------------------------------------------------------------------------------------------------------------------ No results for input(s): HGBA1C in the last 72 hours. ------------------------------------------------------------------------------------------------------------------ No results for input(s): CHOL, HDL, LDLCALC, TRIG, CHOLHDL, LDLDIRECT in the last 72 hours. ------------------------------------------------------------------------------------------------------------------ No results for input(s): TSH, T4TOTAL, T3FREE, THYROIDAB in the last 72 hours.  Invalid input(s): FREET3 ------------------------------------------------------------------------------------------------------------------ No results for input(s): VITAMINB12, FOLATE, FERRITIN, TIBC, IRON, RETICCTPCT in the last 72 hours.  Coagulation profile No results for input(s): INR, PROTIME in the last 168 hours.  No results for input(s): DDIMER in the last 72 hours.  Cardiac Enzymes  Recent Labs Lab 03/02/17 1008  TROPONINI <0.03   ------------------------------------------------------------------------------------------------------------------ Invalid input(s): POCBNP   CBG:  Recent Labs Lab 03/02/17 1807  03/02/17 2317 03/03/17 0758  GLUCAP 206* 278* 216*       Studies: Dg Chest 2 View  Result Date: 03/02/2017 CLINICAL DATA:  Worsening shortness of breath over the last several days. History of COPD. EXAM: CHEST  2 VIEW COMPARISON:  09/16/2014 FINDINGS: Previous median sternotomy and CABG. Worsened patchy density in both lower lobes consistent with atelectasis or pneumonia. Upper lungs remain clear. No evidence of heart failure or effusion. IMPRESSION: Previous CABG. Worsened patchy density in both lower lobes suggesting mild basilar pneumonia. Electronically Signed   By: Nelson Chimes M.D.   On: 03/02/2017 11:34      No results found for: HGBA1C Lab Results  Component Value Date   LDLCALC 88 04/02/2016   CREATININE 1.01 03/02/2017       Scheduled Meds: . amLODipine  10 mg Oral Daily  . aspirin EC  81 mg Oral Daily  . azithromycin  500 mg Intravenous Q24H  . brimonidine  1 drop Left Eye BID  . brinzolamide  1 drop Left Eye BID  . cefTRIAXone (ROCEPHIN)  IV  1 g Intravenous Q24H  . enoxaparin (LOVENOX) injection  40 mg Subcutaneous Q24H  . escitalopram  10 mg Oral Daily  . insulin aspart  0-15 Units Subcutaneous TID WC  . insulin aspart  0-5 Units Subcutaneous QHS  . ipratropium-albuterol  3 mL Nebulization Q6H  . methylPREDNISolone (SOLU-MEDROL) injection  60 mg Intravenous Q6H  . metoprolol succinate  25 mg Oral Daily  . mometasone-formoterol  2 puff Inhalation BID  . multivitamin with minerals  1 tablet Oral Daily  . simvastatin  20 mg Oral q1800  . sodium chloride flush  3 mL Intravenous Q12H   Continuous Infusions:   LOS: 0 days    Time spent: >30 MINS    [facility]  [facility] Pager [phone]. If 7PM-7AM, please contact night-coverage at [email or web address], password [facility] 03/03/2017, 8:57 AM  LOS: 0 days

## 2017-03-04 DIAGNOSIS — J13 Pneumonia due to Streptococcus pneumoniae: Secondary | ICD-10-CM

## 2017-03-04 LAB — GLUCOSE, CAPILLARY
GLUCOSE-CAPILLARY: 208 mg/dL — AB (ref 65–99)
GLUCOSE-CAPILLARY: 229 mg/dL — AB (ref 65–99)
Glucose-Capillary: 214 mg/dL — ABNORMAL HIGH (ref 65–99)
Glucose-Capillary: 233 mg/dL — ABNORMAL HIGH (ref 65–99)

## 2017-03-04 LAB — BASIC METABOLIC PANEL
Anion gap: 11 (ref 5–15)
BUN: 33 mg/dL — AB (ref 6–20)
CHLORIDE: 103 mmol/L (ref 101–111)
CO2: 26 mmol/L (ref 22–32)
Calcium: 9.2 mg/dL (ref 8.9–10.3)
Creatinine, Ser: 0.97 mg/dL (ref 0.61–1.24)
GFR calc Af Amer: >60 mL/min (ref >60)
Glucose, Bld: 227 mg/dL — ABNORMAL HIGH (ref 65–99)
POTASSIUM: 4.4 mmol/L (ref 3.5–5.1)
Sodium: 140 mmol/L (ref 135–145)

## 2017-03-04 LAB — CBC
HCT: 44.8 % (ref 39.0–52.0)
Hemoglobin: 14.2 g/dL (ref 13.0–17.0)
MCH: 30.1 pg (ref 26.0–34.0)
MCHC: 31.7 g/dL (ref 30.0–36.0)
MCV: 95.1 fL (ref 78.0–100.0)
PLATELETS: 221 10*3/uL (ref 150–400)
RBC: 4.71 MIL/uL (ref 4.22–5.81)
RDW: 14.1 % (ref 11.5–15.5)
WBC: 17 10*3/uL — ABNORMAL HIGH (ref 4.0–10.5)

## 2017-03-04 LAB — HIV ANTIBODY (ROUTINE TESTING W REFLEX): HIV Screen 4th Generation wRfx: NONREACTIVE

## 2017-03-04 MED ORDER — PREDNISONE 50 MG PO TABS
50.0000 mg | ORAL_TABLET | Freq: Every day | ORAL | Status: DC
  Administered 2017-03-05: 50 mg via ORAL
  Filled 2017-03-04: qty 1

## 2017-03-04 MED ORDER — PANTOPRAZOLE SODIUM 40 MG PO TBEC
40.0000 mg | DELAYED_RELEASE_TABLET | Freq: Every day | ORAL | Status: DC
Start: 2017-03-04 — End: 2017-03-05
  Administered 2017-03-04 – 2017-03-05 (×2): 40 mg via ORAL
  Filled 2017-03-04 (×2): qty 1

## 2017-03-04 NOTE — Progress Notes (Signed)
Inpatient Diabetes Program Recommendations  AACE/ADA: New Consensus Statement on Inpatient Glycemic Control (2015)  Target Ranges:  Prepandial:   less than 140 mg/dL      Peak postprandial:   less than 180 mg/dL (1-2 hours)      Critically ill patients:  140 - 180 mg/dL   Lab Results  Component Value Date   GLUCAP 214 (H) 03/04/2017   HGBA1C 6.1 (H) 03/02/2017    Review of Glycemic Control  Results for ANTIONIO, NEGRON (MRN [id number]) as of 03/04/2017 08:44  Ref. Range 03/03/2017 07:58 03/03/2017 11:50 03/03/2017 16:42 03/03/2017 21:35 03/04/2017 07:45  Glucose-Capillary Latest Ref Range: 65 - 99 mg/dL 216 (H) 350 (H) 271 (H) 221 (H) 214 (H)    Inpatient Diabetes Program Recommendations:    Add basal insulin - Lantus 10 units QHS Add meal coverage insulin - Novolog 4 units tidwc. Taper insulin as steroids decrease. Goal < 180 mg/dL.  Will continue to follow. Thank you. Lorenda Peck, RD, LDN, CDE Inpatient Diabetes Coordinator [phone]

## 2017-03-04 NOTE — Progress Notes (Signed)
[facility] PROGRESS NOTE  [name] MLY:[id number] DOB: [birth year] DOA: 03/02/2017   PCP: Kandice Hams, MD     Assessment/Plan: Principal Problem:   Pneumonia Active Problems:   Coronary atherosclerosis of native coronary artery   Depression   Anxiety   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HCC)   COPD with acute exacerbation ([place])   Acute on chronic respiratory failure with hypoxia (HCC)   Diabetes mellitus type II, non insulin dependent ([place])   Acute respiratory failure with hypoxia (HCC)   CAP (community acquired pneumonia) due to Pneumococcus [facility])    76 y.o. male with medical history significant for COPD, coronary artery disease with history of CABG, dilated ischemic cardiomyopathy, type 2 diabetes mellitus, and depression with anxiety who presents in transfer from [facility] where he was seen for progressive dyspnea and cough. Patient does not have supplemental oxygen at home and with no recent use of antibiotics or steroids.Chest x-ray features worsening patchy density bilateral lower lobes concerning for mild basilar pneumonia.transferred to [facility] for admission to the telemetry unit for ongoing evaluation and management of acute hypoxic respiratory failure secondary to pneumonia with acute exacerbation and COPD  Assessment and plan 1. Community-acquired PNA with acute hypoxic respiratory failure  - Pt presented [facility] ED with progressive dyspnea and cough, found to be in acute distress with sat 82% on rm air (no baseline supplemental O2 requirement)  - White blood cell count 20.9, and CXR-findings consistent with PNA. White count down to 17 - Continue empiric abx with Rocephin and azithromycin, day #3 - Continue abx, treat associated exacerbation in COPD, and wean O2 as tolerated  , now on 2 L Follow sputum culture, Strep pneumo urinary antigen negative HIV nonreactive    2. COPD with acute exacerbation  - Pt presented to [facility]  in acute distress with hypoxia  - Improved with IV Solu-Medrol, neb treatments, supplemental O2  . Taper steroids. Switched to oral prednisone - Continue systemic steroid, nebs, abx as above, supplemental O2, continue scheduled Dulera in place of his Symbicort    3. Ischemic cardiomyopathy  - Has been well-compensated, noted to have EF 43% on most recent nuc med study  - Not requiring diuretic at home - Continue Toprol, ASA 81; has been intolerant of ACEi and ARB per cardiology notes   4. CAD - No anginal complaints  - Troponin undetectable  - Monitoring on telemetry  - Continue Toprol, Zocor, and ASA 81  5. Depression with anxiety  - Appears to be stable on admission  - Continue Lexapro   6. Type II DM , uncontrolled secondary to steroids Hemoglobin A1c 6.1 ; managed with metformin only at home, held on admission  Tapered Steroids Continue SSI    DVT prophylaxsis Lovenox  Code Status:  Full code   Family Communication: Discussed in detail with the patient, all imaging results, lab results explained to the patient   Disposition Plan: Still requiring oxygen, anticipate discharge tomorrow if stable      Consultants:  None  Procedures:   None  Antibiotics: Anti-infectives    Start     Dose/Rate Route Frequency Ordered Stop   03/03/17 1300  azithromycin (ZITHROMAX) 500 mg in dextrose 5 % 250 mL IVPB     500 mg 250 mL/hr over 60 Minutes Intravenous Every 24 hours 03/02/17 1831 03/09/17 1259   03/03/17 1200  cefTRIAXone (ROCEPHIN) 1 g in dextrose 5 % 50 mL IVPB  1 g 100 mL/hr over 30 Minutes Intravenous Every 24 hours 03/02/17 1831 03/09/17 1159   03/02/17 1237  azithromycin (ZITHROMAX) 500 MG injection    Comments:  [name], [name]   : cabinet override      03/02/17 1237 03/03/17 0044   03/02/17 1145  azithromycin (ZITHROMAX) 500 mg in dextrose 5 % 250 mL IVPB     500 mg 250 mL/hr over 60 Minutes Intravenous  Once 03/02/17 1139 03/02/17 1344   03/02/17  1145  cefTRIAXone (ROCEPHIN) 1 g in dextrose 5 % 50 mL IVPB     1 g 100 mL/hr over 30 Minutes Intravenous  Once 03/02/17 1139 03/02/17 1217         HPI/Subjective: Still sob,   Cough slightly improved , up all night , did not sleep well  Objective: Vitals:   03/03/17 1053 03/03/17 2104 03/04/17 0500 03/04/17 0759  BP: 125/61 (!) 151/69 140/76   Pulse: (!) 102 98 88   Resp:  16 18   Temp:  98.2 F (36.8 C) 97.9 F (36.6 C)   TempSrc:  Oral Oral   SpO2:  96% 95% 93%  Weight:  113.2 kg (249 lb 9 oz)    Height:        Intake/Output Summary (Last 24 hours) at 03/04/17 0906 Last data filed at 03/03/17 1836  Gross per 24 hour  Intake              340 ml  Output                0 ml  Net              340 ml    Exam:     General exam: Appears calm and comfortable  Respiratory: Mildly diminished bilaterally, inspiratory & expiratory wheezes, rhonchi at left base. Normal respiratory effort. No accessory muscle use.  Cardiovascular system: S1 & S2 heard, RRR. No JVD, murmurs, rubs, gallops or clicks. No pedal edema. Gastrointestinal system: Abdomen is nondistended, soft and nontender. No organomegaly or masses felt. Normal bowel sounds heard. Central nervous system: Alert and oriented. No focal neurological deficits. Extremities: Symmetric 5 x 5 power. Skin: No rashes, lesions or ulcers Psychiatry: Judgement and insight appear normal. Mood & affect appropriate.     Data Reviewed: I have personally reviewed following labs and imaging studies  Micro Results Recent Results (from the past 240 hour(s))  Respiratory Panel by PCR     Status: None   Collection Time: 03/03/17 12:45 PM  Result Value Ref Range Status   Adenovirus NOT DETECTED NOT DETECTED Final   Coronavirus 229E NOT DETECTED NOT DETECTED Final   Coronavirus HKU1 NOT DETECTED NOT DETECTED Final   Coronavirus NL63 NOT DETECTED NOT DETECTED Final   Coronavirus OC43 NOT DETECTED NOT DETECTED Final   Metapneumovirus  NOT DETECTED NOT DETECTED Final   Rhinovirus / Enterovirus NOT DETECTED NOT DETECTED Final   Influenza A NOT DETECTED NOT DETECTED Final   Influenza B NOT DETECTED NOT DETECTED Final   Parainfluenza Virus 1 NOT DETECTED NOT DETECTED Final   Parainfluenza Virus 2 NOT DETECTED NOT DETECTED Final   Parainfluenza Virus 3 NOT DETECTED NOT DETECTED Final   Parainfluenza Virus 4 NOT DETECTED NOT DETECTED Final   Respiratory Syncytial Virus NOT DETECTED NOT DETECTED Final   Bordetella pertussis NOT DETECTED NOT DETECTED Final   Chlamydophila pneumoniae NOT DETECTED NOT DETECTED Final   Mycoplasma pneumoniae NOT DETECTED NOT DETECTED Final  Radiology Reports Dg Chest 2 View  Result Date: 03/02/2017 CLINICAL DATA:  Worsening shortness of breath over the last several days. History of COPD. EXAM: CHEST  2 VIEW COMPARISON:  09/16/2014 FINDINGS: Previous median sternotomy and CABG. Worsened patchy density in both lower lobes consistent with atelectasis or pneumonia. Upper lungs remain clear. No evidence of heart failure or effusion. IMPRESSION: Previous CABG. Worsened patchy density in both lower lobes suggesting mild basilar pneumonia. Electronically Signed   By: Nelson Chimes M.D.   On: 03/02/2017 11:34     CBC  Recent Labs Lab 03/02/17 1008 03/04/17 0522  WBC 20.9* 17.0*  HGB 13.9 14.2  HCT 43.4 44.8  PLT 196 221  MCV 94.3 95.1  MCH 30.2 30.1  MCHC 32.0 31.7  RDW 13.9 14.1  LYMPHSABS 0.8  --   MONOABS 1.9*  --   EOSABS 0.0  --   BASOSABS 0.0  --     Chemistries   Recent Labs Lab 03/02/17 1008 03/04/17 0522  NA 137 140  K 4.2 4.4  CL 104 103  CO2 24 26  GLUCOSE 167* 227*  BUN 15 33*  CREATININE 1.01 0.97  CALCIUM 9.1 9.2   ------------------------------------------------------------------------------------------------------------------ estimated creatinine clearance is 81.4 mL/min (by C-G formula based on SCr of 0.97  mg/dL). ------------------------------------------------------------------------------------------------------------------  Recent Labs  03/02/17 2017  HGBA1C 6.1*   ------------------------------------------------------------------------------------------------------------------ No results for input(s): CHOL, HDL, LDLCALC, TRIG, CHOLHDL, LDLDIRECT in the last 72 hours. ------------------------------------------------------------------------------------------------------------------ No results for input(s): TSH, T4TOTAL, T3FREE, THYROIDAB in the last 72 hours.  Invalid input(s): FREET3 ------------------------------------------------------------------------------------------------------------------ No results for input(s): VITAMINB12, FOLATE, FERRITIN, TIBC, IRON, RETICCTPCT in the last 72 hours.  Coagulation profile No results for input(s): INR, PROTIME in the last 168 hours.  No results for input(s): DDIMER in the last 72 hours.  Cardiac Enzymes  Recent Labs Lab 03/02/17 1008  TROPONINI <0.03   ------------------------------------------------------------------------------------------------------------------ Invalid input(s): POCBNP   CBG:  Recent Labs Lab 03/03/17 0758 03/03/17 1150 03/03/17 1642 03/03/17 2135 03/04/17 0745  GLUCAP 216* 350* 271* 221* 214*       Studies: Dg Chest 2 View  Result Date: 03/02/2017 CLINICAL DATA:  Worsening shortness of breath over the last several days. History of COPD. EXAM: CHEST  2 VIEW COMPARISON:  09/16/2014 FINDINGS: Previous median sternotomy and CABG. Worsened patchy density in both lower lobes consistent with atelectasis or pneumonia. Upper lungs remain clear. No evidence of heart failure or effusion. IMPRESSION: Previous CABG. Worsened patchy density in both lower lobes suggesting mild basilar pneumonia. Electronically Signed   By: Nelson Chimes M.D.   On: 03/02/2017 11:34      Lab Results  Component Value Date    HGBA1C 6.1 (H) 03/02/2017   Lab Results  Component Value Date   LDLCALC 88 04/02/2016   CREATININE 0.97 03/04/2017       Scheduled Meds: . amLODipine  10 mg Oral Daily  . aspirin EC  81 mg Oral Daily  . azithromycin  500 mg Intravenous Q24H  . Brinzolamide-Brimonidine  1 drop Ophthalmic BID  . cefTRIAXone (ROCEPHIN)  IV  1 g Intravenous Q24H  . enoxaparin (LOVENOX) injection  40 mg Subcutaneous Q24H  . escitalopram  10 mg Oral Daily  . insulin aspart  0-15 Units Subcutaneous TID WC  . insulin aspart  0-5 Units Subcutaneous QHS  . metoprolol succinate  25 mg Oral Daily  . mometasone-formoterol  2 puff Inhalation BID  . multivitamin with minerals  1 tablet Oral Daily  .  pantoprazole  40 mg Oral Daily  . [START ON 03/05/2017] predniSONE  50 mg Oral Q breakfast  . simvastatin  20 mg Oral q1800  . sodium chloride flush  3 mL Intravenous Q12H   Continuous Infusions:   LOS: 1 day    Time spent: >30 MINS    [facility]  [facility] Pager [phone]. If 7PM-7AM, please contact night-coverage at [email or web address], password [facility] 03/04/2017, 9:06 AM  LOS: 1 day

## 2017-03-04 NOTE — Progress Notes (Signed)
Tech offered Pt a bath, Pt stated that he is able to do his bath with no assistance. Tech gave Pt all bath supplies.

## 2017-03-05 LAB — GLUCOSE, CAPILLARY
GLUCOSE-CAPILLARY: 147 mg/dL — AB (ref 65–99)
Glucose-Capillary: 138 mg/dL — ABNORMAL HIGH (ref 65–99)

## 2017-03-05 MED ORDER — INSULIN GLARGINE 100 UNIT/ML ~~LOC~~ SOLN
25.0000 [IU] | Freq: Once | SUBCUTANEOUS | Status: AC
  Administered 2017-03-05: 25 [IU] via SUBCUTANEOUS
  Filled 2017-03-05: qty 0.25

## 2017-03-05 MED ORDER — CEFDINIR 300 MG PO CAPS: 300.0000 mg | ORAL_CAPSULE | Freq: Two times a day (BID) | ORAL | 0 refills | Status: AC

## 2017-03-05 MED ORDER — ALBUTEROL SULFATE HFA 108 (90 BASE) MCG/ACT IN AERS: 1.0000 | INHALATION_SPRAY | Freq: Four times a day (QID) | RESPIRATORY_TRACT | 1 refills | Status: AC | PRN

## 2017-03-05 MED ORDER — GUAIFENESIN-DM 100-10 MG/5ML PO SYRP: 5.0000 mL | ORAL_SOLUTION | ORAL | 0 refills | Status: DC | PRN

## 2017-03-05 MED ORDER — AZITHROMYCIN 500 MG PO TABS: 500.0000 mg | ORAL_TABLET | Freq: Every day | ORAL | 0 refills | Status: AC

## 2017-03-05 NOTE — Progress Notes (Signed)
SATURATION QUALIFICATIONS: (This note is used to comply with regulatory documentation for home oxygen)  Patient Saturations on Room Air at Rest = 94%  Patient Saturations on Room Air while Ambulating = 89%  Patient Saturations on 1 Liter of oxygen while Ambulating = 97%

## 2017-03-05 NOTE — Discharge Summary (Addendum)
Physician Discharge Summary  [name] MRN: [id number] DOB/AGE: 17-Sep-1941 76 y.o.  PCP: Kandice Hams, MD   Admit date: 03/02/2017 Discharge date: 03/05/2017  Discharge Diagnoses:    Principal Problem:   Pneumonia Active Problems:   Coronary atherosclerosis of native coronary artery   Depression   Anxiety   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HCC)   COPD with acute exacerbation (HCC)   Acute on chronic respiratory failure with hypoxia (HCC)   Diabetes mellitus type II, non insulin dependent ([place])   Acute respiratory failure with hypoxia ([place])   CAP (community acquired pneumonia) due to Pneumococcus [facility])    Follow-up recommendations Follow-up with PCP in 3-5 days , including all  additional recommended appointments as below Follow-up CBC, CMP in 3-5 days       Current Discharge Medication List    START taking these medications   Details  azithromycin (ZITHROMAX) 500 MG tablet Take 1 tablet (500 mg total) by mouth daily. Qty: 5 tablet, Refills: 0    cefdinir (OMNICEF) 300 MG capsule Take 1 capsule (300 mg total) by mouth 2 (two) times daily. Qty: 10 capsule, Refills: 0    guaiFENesin-dextromethorphan (ROBITUSSIN DM) 100-10 MG/5ML syrup Take 5 mLs by mouth every 4 (four) hours as needed for cough. Qty: 118 mL, Refills: 0      CONTINUE these medications which have CHANGED   Details  albuterol (PROVENTIL HFA;VENTOLIN HFA) 108 (90 Base) MCG/ACT inhaler Inhale 1-2 puffs into the lungs every 6 (six) hours as needed for wheezing or shortness of breath. Qty: 1 Inhaler, Refills: 1      CONTINUE these medications which have NOT CHANGED   Details  amLODipine (NORVASC) 10 MG tablet Take 1 tablet (10 mg total) by mouth daily. Qty: 90 tablet, Refills: 3   Associated Diagnoses: Essential hypertension    aspirin EC 81 MG tablet Take 81 mg by mouth daily.    Besifloxacin HCl (BESIVANCE) 0.6 % SUSP Apply 4 drops to eye. 4 drops in each eye twice a month. After eye  injection.    escitalopram (LEXAPRO) 10 MG tablet Take 1 tablet by mouth daily.    metFORMIN (GLUCOPHAGE) 500 MG tablet Take by mouth 2 (two) times daily with a meal.    metoprolol succinate (TOPROL-XL) 25 MG 24 hr tablet Take 1 tablet (25 mg total) by mouth daily. Qty: 90 tablet, Refills: 3   Associated Diagnoses: Coronary artery disease involving native coronary artery of native heart without angina pectoris    Multiple Vitamin (MULTIVITAMIN) capsule Take 1 capsule by mouth daily.    SIMBRINZA 1-0.2 % SUSP Place 1 drop into the left eye 2 (two) times daily.  Refills: 4    simvastatin (ZOCOR) 20 MG tablet Take 1 tablet (20 mg total) by mouth daily at 6 PM. Qty: 90 tablet, Refills: 3   Associated Diagnoses: Coronary artery disease involving native coronary artery of native heart without angina pectoris; Dyslipidemia    SYMBICORT 160-4.5 MCG/ACT inhaler Inhale 2 puffs into the lungs 2 (two) times daily.          Discharge Condition: Stable   Discharge Instructions Get Medicines reviewed and adjusted: Please take all your medications with you for your next visit with your Primary MD  Please request your Primary MD to go over all hospital tests and procedure/radiological results at the follow up, please ask your Primary MD to get all Hospital records sent to his/her office.  If you experience worsening of your admission symptoms, develop shortness of breath,  life threatening emergency, suicidal or homicidal thoughts you must seek medical attention immediately by calling 911 or calling your MD immediately if symptoms less severe.  You must read complete instructions/literature along with all the possible adverse reactions/side effects for all the Medicines you take and that have been prescribed to you. Take any new Medicines after you have completely understood and accpet all the possible adverse reactions/side effects.   Do not drive when taking Pain medications.   Do not take  more than prescribed Pain, Sleep and Anxiety Medications  Special Instructions: If you have smoked or chewed Tobacco in the last 2 yrs please stop smoking, stop any regular Alcohol and or any Recreational drug use.  Wear Seat belts while driving.  Please note  You were cared for by a hospitalist during your hospital stay. Once you are discharged, your primary care physician will handle any further medical issues. Please note that NO REFILLS for any discharge medications will be authorized once you are discharged, as it is imperative that you return to your primary care physician (or establish a relationship with a primary care physician if you do not have one) for your aftercare needs so that they can reassess your need for medications and monitor your lab values.     No Known Allergies    Disposition:    Consults: None    Significant Diagnostic Studies:  Dg Chest 2 View  Result Date: 03/02/2017 CLINICAL DATA:  Worsening shortness of breath over the last several days. History of COPD. EXAM: CHEST  2 VIEW COMPARISON:  09/16/2014 FINDINGS: Previous median sternotomy and CABG. Worsened patchy density in both lower lobes consistent with atelectasis or pneumonia. Upper lungs remain clear. No evidence of heart failure or effusion. IMPRESSION: Previous CABG. Worsened patchy density in both lower lobes suggesting mild basilar pneumonia. Electronically Signed   By: Nelson Chimes M.D.   On: 03/02/2017 11:34        Filed Weights   03/03/17 0126 03/03/17 2104 03/04/17 2031  Weight: 104.3 kg (229 lb 15 oz) 113.2 kg (249 lb 9 oz) 114.1 kg (251 lb 9.6 oz)     Microbiology: Recent Results (from the past 240 hour(s))  Respiratory Panel by PCR     Status: None   Collection Time: 03/03/17 12:45 PM  Result Value Ref Range Status   Adenovirus NOT DETECTED NOT DETECTED Final   Coronavirus 229E NOT DETECTED NOT DETECTED Final   Coronavirus HKU1 NOT DETECTED NOT DETECTED Final   Coronavirus  NL63 NOT DETECTED NOT DETECTED Final   Coronavirus OC43 NOT DETECTED NOT DETECTED Final   Metapneumovirus NOT DETECTED NOT DETECTED Final   Rhinovirus / Enterovirus NOT DETECTED NOT DETECTED Final   Influenza A NOT DETECTED NOT DETECTED Final   Influenza B NOT DETECTED NOT DETECTED Final   Parainfluenza Virus 1 NOT DETECTED NOT DETECTED Final   Parainfluenza Virus 2 NOT DETECTED NOT DETECTED Final   Parainfluenza Virus 3 NOT DETECTED NOT DETECTED Final   Parainfluenza Virus 4 NOT DETECTED NOT DETECTED Final   Respiratory Syncytial Virus NOT DETECTED NOT DETECTED Final   Bordetella pertussis NOT DETECTED NOT DETECTED Final   Chlamydophila pneumoniae NOT DETECTED NOT DETECTED Final   Mycoplasma pneumoniae NOT DETECTED NOT DETECTED Final       Blood Culture No results found for: SDES, SPECREQUEST, CULT, REPTSTATUS    Labs: Results for orders placed or performed during the hospital encounter of 03/02/17 (from the past 48 hour(s))  Glucose, capillary  Status: Abnormal   Collection Time: 03/03/17 11:50 AM  Result Value Ref Range   Glucose-Capillary 350 (H) 65 - 99 mg/dL   Comment 1 Notify RN   Respiratory Panel by PCR     Status: None   Collection Time: 03/03/17 12:45 PM  Result Value Ref Range   Adenovirus NOT DETECTED NOT DETECTED   Coronavirus 229E NOT DETECTED NOT DETECTED   Coronavirus HKU1 NOT DETECTED NOT DETECTED   Coronavirus NL63 NOT DETECTED NOT DETECTED   Coronavirus OC43 NOT DETECTED NOT DETECTED   Metapneumovirus NOT DETECTED NOT DETECTED   Rhinovirus / Enterovirus NOT DETECTED NOT DETECTED   Influenza A NOT DETECTED NOT DETECTED   Influenza B NOT DETECTED NOT DETECTED   Parainfluenza Virus 1 NOT DETECTED NOT DETECTED   Parainfluenza Virus 2 NOT DETECTED NOT DETECTED   Parainfluenza Virus 3 NOT DETECTED NOT DETECTED   Parainfluenza Virus 4 NOT DETECTED NOT DETECTED   Respiratory Syncytial Virus NOT DETECTED NOT DETECTED   Bordetella pertussis NOT DETECTED NOT  DETECTED   Chlamydophila pneumoniae NOT DETECTED NOT DETECTED   Mycoplasma pneumoniae NOT DETECTED NOT DETECTED  Glucose, capillary     Status: Abnormal   Collection Time: 03/03/17  4:42 PM  Result Value Ref Range   Glucose-Capillary 271 (H) 65 - 99 mg/dL   Comment 1 Notify RN   Glucose, capillary     Status: Abnormal   Collection Time: 03/03/17  9:35 PM  Result Value Ref Range   Glucose-Capillary 221 (H) 65 - 99 mg/dL  CBC     Status: Abnormal   Collection Time: 03/04/17  5:22 AM  Result Value Ref Range   WBC 17.0 (H) 4.0 - 10.5 K/uL   RBC 4.71 4.22 - 5.81 MIL/uL   Hemoglobin 14.2 13.0 - 17.0 g/dL   HCT 44.8 39.0 - 52.0 %   MCV 95.1 78.0 - 100.0 fL   MCH 30.1 26.0 - 34.0 pg   MCHC 31.7 30.0 - 36.0 g/dL   RDW 14.1 11.5 - 15.5 %   Platelets 221 150 - 400 K/uL  Basic metabolic panel     Status: Abnormal   Collection Time: 03/04/17  5:22 AM  Result Value Ref Range   Sodium 140 135 - 145 mmol/L   Potassium 4.4 3.5 - 5.1 mmol/L   Chloride 103 101 - 111 mmol/L   CO2 26 22 - 32 mmol/L   Glucose, Bld 227 (H) 65 - 99 mg/dL   BUN 33 (H) 6 - 20 mg/dL   Creatinine, Ser 0.97 0.61 - 1.24 mg/dL   Calcium 9.2 8.9 - 10.3 mg/dL   GFR calc non Af Amer >60 >60 mL/min   GFR calc Af Amer >60 >60 mL/min    Comment: (NOTE) The eGFR has been calculated using the CKD EPI equation. This calculation has not been validated in all clinical situations. eGFR's persistently <60 mL/min signify possible Chronic Kidney Disease.    Anion gap 11 5 - 15  Glucose, capillary     Status: Abnormal   Collection Time: 03/04/17  7:45 AM  Result Value Ref Range   Glucose-Capillary 214 (H) 65 - 99 mg/dL  Glucose, capillary     Status: Abnormal   Collection Time: 03/04/17 11:46 AM  Result Value Ref Range   Glucose-Capillary 233 (H) 65 - 99 mg/dL  Glucose, capillary     Status: Abnormal   Collection Time: 03/04/17  4:44 PM  Result Value Ref Range   Glucose-Capillary 208 (H) 65 -  99 mg/dL  Glucose, capillary      Status: Abnormal   Collection Time: 03/04/17  8:35 PM  Result Value Ref Range   Glucose-Capillary 229 (H) 65 - 99 mg/dL  Glucose, capillary     Status: Abnormal   Collection Time: 03/05/17  8:03 AM  Result Value Ref Range   Glucose-Capillary 138 (H) 65 - 99 mg/dL     Lipid Panel     Component Value Date/Time   CHOL 179 04/02/2016 1117   TRIG 60 04/02/2016 1117   HDL 79 04/02/2016 1117   CHOLHDL 2.3 04/02/2016 1117   VLDL 12 04/02/2016 1117   LDLCALC 88 04/02/2016 1117   LDLDIRECT 169.5 10/25/2013 1201     Lab Results  Component Value Date   HGBA1C 6.1 (H) 03/02/2017     Lab Results  Component Value Date   LDLCALC 88 04/02/2016   CREATININE 0.97 03/04/2017     HPI   76 y.o.malewith medical history significant for COPD, coronary artery disease with history of CABG, dilated ischemic cardiomyopathy, type 2 diabetes mellitus, and depression with anxiety who presents in transfer from [facility] where he was seen for progressive dyspnea and cough. Patient gives a history of COPD not requiring supplemental oxygen at home and with no recent use of antibiotics or steroids.Chest x-ray features worsening patchy density bilateral lower lobes concerning for mild basilar pneumonia.transferred to [facility] for admission to the telemetry unit for ongoing evaluation and management of acute hypoxic respiratory failure secondary to pneumonia with acute exacerbation and COPD  HOSPITAL COURSE   1. Community-acquired PNA with acute hypoxic respiratory failure  - Pt presented [facility] ED with progressive dyspnea and cough, found to be in acute distress with sat 82% on rm air (no baseline supplemental O2 requirement)  -initial  White blood cell count 20.9, and CXR-findings consistent with PNA  Started on Rocephin and azithromycin, he received it for 3 days, now switched to azithromycin and Omnicef for 5 days Ambulatory oxygen saturation evaluated prior to discharge.  Oxygen saturation was maintained on room air Follow sputum culture, Strep pneumo urinary antigen negative HIV antibody negative    2. COPD with acute exacerbation  - Pt presented to [facility] in acute distress with hypoxia  - Improved with IV Solu-Medrol, neb treatments, supplemental O2  - Patient continued to receive systemic steroid, nebs, abx as above, supplemental O2,  scheduled Dulera in place of his Symbicort  Prednisone discontinued prior to discharge, secondary to improvement in wheezing and uncontrolled hyperglycemia  3. Ischemic cardiomyopathy  - Has been well-compensated, noted to have EF 43% on most recent nuc med study  - Not requiring diuretic at home - Continue Toprol, ASA 81; has been intolerant of ACEi and ARB per cardiology notes   4. CAD - No anginal complaints  - Troponin within normal limits - Monitoring on telemetry  - Continue Toprol, Zocor, and ASA 81  5. Depression with anxiety  - Appears to be stable on admission  - Continue Lexapro   6. Type II DM  Hemoglobin A1c 6.1; managed with metformin only at home, held on admission  Difficult to control CABG secondary to steroids Steroids have now been discontinued     Discharge Exam:   Blood pressure 134/78, pulse 90, temperature 97.8 F (36.6 C), temperature source Oral, resp. rate 17, height '5\' 7"'  (1.702 m), weight 114.1 kg (251 lb 9.6 oz), SpO2 94 %. General exam: Appears calm and comfortable  Respiratory:  rhonchi  at left base. Normal respiratory effort. No accessory muscle use.  Cardiovascular system: S1 & S2 heard, RRR. No JVD, murmurs, rubs, gallops or clicks. No pedal edema. Gastrointestinal system: Abdomen is nondistended, soft and nontender. No organomegaly or masses felt. Normal bowel sounds heard. Central nervous system: Alert and oriented. No focal neurological deficits. Extremities: Symmetric 5 x 5 power. Skin: No rashes, lesions or ulcers Psychiatry: Judgement and insight appear  normal. Mood & affect appropriate.         SignedReyne Dumas 03/05/2017, 10:50 AM        Time spent >45 mins

## 2017-03-05 NOTE — Progress Notes (Signed)
Pt discharge to home, discharge instructions, medications and follow up appointments discussed and reviewed with pt, verbalized understanding. Telemetry discontinued, CCMD notified. IV discontinued, cath intact, site clean and dry. Pt was escorted out of the unit in wheelchair, took all belongings with him.

## 2017-03-08 ENCOUNTER — Other Ambulatory Visit: Payer: Self-pay | Admitting: Physician Assistant

## 2017-03-08 DIAGNOSIS — I2510 Atherosclerotic heart disease of native coronary artery without angina pectoris: Secondary | ICD-10-CM

## 2017-03-08 DIAGNOSIS — E785 Hyperlipidemia, unspecified: Secondary | ICD-10-CM

## 2017-03-11 ENCOUNTER — Encounter (INDEPENDENT_AMBULATORY_CARE_PROVIDER_SITE_OTHER): Payer: Medicare Other | Admitting: Ophthalmology

## 2017-03-11 DIAGNOSIS — E113291 Type 2 diabetes mellitus with mild nonproliferative diabetic retinopathy without macular edema, right eye: Secondary | ICD-10-CM | POA: Diagnosis not present

## 2017-03-11 DIAGNOSIS — H35033 Hypertensive retinopathy, bilateral: Secondary | ICD-10-CM

## 2017-03-11 DIAGNOSIS — I10 Essential (primary) hypertension: Secondary | ICD-10-CM | POA: Diagnosis not present

## 2017-03-11 DIAGNOSIS — E113212 Type 2 diabetes mellitus with mild nonproliferative diabetic retinopathy with macular edema, left eye: Secondary | ICD-10-CM

## 2017-03-11 DIAGNOSIS — H353231 Exudative age-related macular degeneration, bilateral, with active choroidal neovascularization: Secondary | ICD-10-CM | POA: Diagnosis not present

## 2017-03-11 DIAGNOSIS — E11311 Type 2 diabetes mellitus with unspecified diabetic retinopathy with macular edema: Secondary | ICD-10-CM

## 2017-03-11 DIAGNOSIS — H43813 Vitreous degeneration, bilateral: Secondary | ICD-10-CM

## 2017-03-24 ENCOUNTER — Other Ambulatory Visit: Payer: Self-pay | Admitting: Physician Assistant

## 2017-03-24 DIAGNOSIS — I2510 Atherosclerotic heart disease of native coronary artery without angina pectoris: Secondary | ICD-10-CM

## 2017-03-31 ENCOUNTER — Other Ambulatory Visit: Payer: Self-pay | Admitting: Physician Assistant

## 2017-03-31 DIAGNOSIS — I10 Essential (primary) hypertension: Secondary | ICD-10-CM

## 2017-04-08 ENCOUNTER — Encounter (INDEPENDENT_AMBULATORY_CARE_PROVIDER_SITE_OTHER): Payer: Medicare Other | Admitting: Ophthalmology

## 2017-04-08 DIAGNOSIS — E11311 Type 2 diabetes mellitus with unspecified diabetic retinopathy with macular edema: Secondary | ICD-10-CM

## 2017-04-08 DIAGNOSIS — H43813 Vitreous degeneration, bilateral: Secondary | ICD-10-CM | POA: Diagnosis not present

## 2017-04-08 DIAGNOSIS — H353231 Exudative age-related macular degeneration, bilateral, with active choroidal neovascularization: Secondary | ICD-10-CM

## 2017-04-08 DIAGNOSIS — I10 Essential (primary) hypertension: Secondary | ICD-10-CM

## 2017-04-08 DIAGNOSIS — H35033 Hypertensive retinopathy, bilateral: Secondary | ICD-10-CM | POA: Diagnosis not present

## 2017-04-08 DIAGNOSIS — E113212 Type 2 diabetes mellitus with mild nonproliferative diabetic retinopathy with macular edema, left eye: Secondary | ICD-10-CM | POA: Diagnosis not present

## 2017-04-08 DIAGNOSIS — E113291 Type 2 diabetes mellitus with mild nonproliferative diabetic retinopathy without macular edema, right eye: Secondary | ICD-10-CM

## 2017-04-22 ENCOUNTER — Other Ambulatory Visit: Payer: Self-pay | Admitting: Physician Assistant

## 2017-04-22 DIAGNOSIS — E785 Hyperlipidemia, unspecified: Secondary | ICD-10-CM

## 2017-04-22 DIAGNOSIS — I2510 Atherosclerotic heart disease of native coronary artery without angina pectoris: Secondary | ICD-10-CM

## 2017-04-29 ENCOUNTER — Other Ambulatory Visit: Payer: Self-pay | Admitting: Family Medicine

## 2017-04-29 ENCOUNTER — Ambulatory Visit
Admission: RE | Admit: 2017-04-29 | Discharge: 2017-04-29 | Disposition: A | Discharge status: Home / Self Care | Payer: No Typology Code available for payment source | Source: Ambulatory Visit | Attending: Family Medicine | Admitting: Family Medicine

## 2017-04-29 DIAGNOSIS — J449 Chronic obstructive pulmonary disease, unspecified: Secondary | ICD-10-CM

## 2017-05-04 ENCOUNTER — Other Ambulatory Visit: Payer: Self-pay | Admitting: Cardiovascular Disease

## 2017-05-04 DIAGNOSIS — I2510 Atherosclerotic heart disease of native coronary artery without angina pectoris: Secondary | ICD-10-CM

## 2017-05-05 ENCOUNTER — Other Ambulatory Visit: Payer: Self-pay | Admitting: Cardiovascular Disease

## 2017-05-05 DIAGNOSIS — I2510 Atherosclerotic heart disease of native coronary artery without angina pectoris: Secondary | ICD-10-CM

## 2017-05-12 ENCOUNTER — Other Ambulatory Visit: Payer: Self-pay | Admitting: Physician Assistant

## 2017-05-12 ENCOUNTER — Other Ambulatory Visit: Payer: Self-pay | Admitting: Cardiology

## 2017-05-12 DIAGNOSIS — I10 Essential (primary) hypertension: Secondary | ICD-10-CM

## 2017-05-12 DIAGNOSIS — I2510 Atherosclerotic heart disease of native coronary artery without angina pectoris: Secondary | ICD-10-CM

## 2017-05-12 DIAGNOSIS — E785 Hyperlipidemia, unspecified: Secondary | ICD-10-CM

## 2017-05-14 ENCOUNTER — Other Ambulatory Visit: Payer: Self-pay | Admitting: Cardiology

## 2017-05-14 DIAGNOSIS — I10 Essential (primary) hypertension: Secondary | ICD-10-CM

## 2017-05-20 ENCOUNTER — Encounter (INDEPENDENT_AMBULATORY_CARE_PROVIDER_SITE_OTHER): Payer: Medicare Other | Admitting: Ophthalmology

## 2017-05-20 DIAGNOSIS — H35033 Hypertensive retinopathy, bilateral: Secondary | ICD-10-CM | POA: Diagnosis not present

## 2017-05-20 DIAGNOSIS — I10 Essential (primary) hypertension: Secondary | ICD-10-CM | POA: Diagnosis not present

## 2017-05-20 DIAGNOSIS — E113293 Type 2 diabetes mellitus with mild nonproliferative diabetic retinopathy without macular edema, bilateral: Secondary | ICD-10-CM

## 2017-05-20 DIAGNOSIS — H353231 Exudative age-related macular degeneration, bilateral, with active choroidal neovascularization: Secondary | ICD-10-CM | POA: Diagnosis not present

## 2017-05-20 DIAGNOSIS — E11319 Type 2 diabetes mellitus with unspecified diabetic retinopathy without macular edema: Secondary | ICD-10-CM | POA: Diagnosis not present

## 2017-05-20 DIAGNOSIS — H43813 Vitreous degeneration, bilateral: Secondary | ICD-10-CM | POA: Diagnosis not present

## 2017-06-25 ENCOUNTER — Encounter (INDEPENDENT_AMBULATORY_CARE_PROVIDER_SITE_OTHER): Payer: Medicare Other | Admitting: Ophthalmology

## 2017-06-25 DIAGNOSIS — H353231 Exudative age-related macular degeneration, bilateral, with active choroidal neovascularization: Secondary | ICD-10-CM | POA: Diagnosis not present

## 2017-06-25 DIAGNOSIS — H35033 Hypertensive retinopathy, bilateral: Secondary | ICD-10-CM

## 2017-06-25 DIAGNOSIS — E113291 Type 2 diabetes mellitus with mild nonproliferative diabetic retinopathy without macular edema, right eye: Secondary | ICD-10-CM

## 2017-06-25 DIAGNOSIS — E11311 Type 2 diabetes mellitus with unspecified diabetic retinopathy with macular edema: Secondary | ICD-10-CM

## 2017-06-25 DIAGNOSIS — E113212 Type 2 diabetes mellitus with mild nonproliferative diabetic retinopathy with macular edema, left eye: Secondary | ICD-10-CM | POA: Diagnosis not present

## 2017-06-25 DIAGNOSIS — I10 Essential (primary) hypertension: Secondary | ICD-10-CM

## 2017-06-25 DIAGNOSIS — H43813 Vitreous degeneration, bilateral: Secondary | ICD-10-CM

## 2017-07-30 ENCOUNTER — Encounter (INDEPENDENT_AMBULATORY_CARE_PROVIDER_SITE_OTHER): Payer: Medicare Other | Admitting: Ophthalmology

## 2017-07-30 DIAGNOSIS — E113291 Type 2 diabetes mellitus with mild nonproliferative diabetic retinopathy without macular edema, right eye: Secondary | ICD-10-CM

## 2017-07-30 DIAGNOSIS — I10 Essential (primary) hypertension: Secondary | ICD-10-CM | POA: Diagnosis not present

## 2017-07-30 DIAGNOSIS — H35033 Hypertensive retinopathy, bilateral: Secondary | ICD-10-CM | POA: Diagnosis not present

## 2017-07-30 DIAGNOSIS — H43813 Vitreous degeneration, bilateral: Secondary | ICD-10-CM | POA: Diagnosis not present

## 2017-07-30 DIAGNOSIS — H353231 Exudative age-related macular degeneration, bilateral, with active choroidal neovascularization: Secondary | ICD-10-CM

## 2017-07-30 DIAGNOSIS — E113312 Type 2 diabetes mellitus with moderate nonproliferative diabetic retinopathy with macular edema, left eye: Secondary | ICD-10-CM | POA: Diagnosis not present

## 2017-08-27 ENCOUNTER — Encounter (INDEPENDENT_AMBULATORY_CARE_PROVIDER_SITE_OTHER): Payer: Medicare Other | Admitting: Ophthalmology

## 2017-08-27 DIAGNOSIS — H353231 Exudative age-related macular degeneration, bilateral, with active choroidal neovascularization: Secondary | ICD-10-CM | POA: Diagnosis not present

## 2017-08-27 DIAGNOSIS — E113212 Type 2 diabetes mellitus with mild nonproliferative diabetic retinopathy with macular edema, left eye: Secondary | ICD-10-CM

## 2017-08-27 DIAGNOSIS — H35033 Hypertensive retinopathy, bilateral: Secondary | ICD-10-CM

## 2017-08-27 DIAGNOSIS — H43813 Vitreous degeneration, bilateral: Secondary | ICD-10-CM

## 2017-08-27 DIAGNOSIS — E113291 Type 2 diabetes mellitus with mild nonproliferative diabetic retinopathy without macular edema, right eye: Secondary | ICD-10-CM

## 2017-08-27 DIAGNOSIS — I10 Essential (primary) hypertension: Secondary | ICD-10-CM | POA: Diagnosis not present

## 2017-08-27 DIAGNOSIS — E11311 Type 2 diabetes mellitus with unspecified diabetic retinopathy with macular edema: Secondary | ICD-10-CM | POA: Diagnosis not present

## 2017-09-24 ENCOUNTER — Encounter (INDEPENDENT_AMBULATORY_CARE_PROVIDER_SITE_OTHER): Payer: Medicare Other | Admitting: Ophthalmology

## 2017-09-24 DIAGNOSIS — H353231 Exudative age-related macular degeneration, bilateral, with active choroidal neovascularization: Secondary | ICD-10-CM | POA: Diagnosis not present

## 2017-09-24 DIAGNOSIS — E11311 Type 2 diabetes mellitus with unspecified diabetic retinopathy with macular edema: Secondary | ICD-10-CM

## 2017-09-24 DIAGNOSIS — I10 Essential (primary) hypertension: Secondary | ICD-10-CM | POA: Diagnosis not present

## 2017-09-24 DIAGNOSIS — H35033 Hypertensive retinopathy, bilateral: Secondary | ICD-10-CM | POA: Diagnosis not present

## 2017-09-24 DIAGNOSIS — E113291 Type 2 diabetes mellitus with mild nonproliferative diabetic retinopathy without macular edema, right eye: Secondary | ICD-10-CM

## 2017-09-24 DIAGNOSIS — H43813 Vitreous degeneration, bilateral: Secondary | ICD-10-CM

## 2017-09-24 DIAGNOSIS — E113312 Type 2 diabetes mellitus with moderate nonproliferative diabetic retinopathy with macular edema, left eye: Secondary | ICD-10-CM | POA: Diagnosis not present

## 2017-10-22 ENCOUNTER — Encounter (INDEPENDENT_AMBULATORY_CARE_PROVIDER_SITE_OTHER): Payer: Medicare Other | Admitting: Ophthalmology

## 2017-10-22 DIAGNOSIS — H353231 Exudative age-related macular degeneration, bilateral, with active choroidal neovascularization: Secondary | ICD-10-CM

## 2017-10-22 DIAGNOSIS — H35033 Hypertensive retinopathy, bilateral: Secondary | ICD-10-CM | POA: Diagnosis not present

## 2017-10-22 DIAGNOSIS — H43813 Vitreous degeneration, bilateral: Secondary | ICD-10-CM | POA: Diagnosis not present

## 2017-10-22 DIAGNOSIS — E113312 Type 2 diabetes mellitus with moderate nonproliferative diabetic retinopathy with macular edema, left eye: Secondary | ICD-10-CM

## 2017-10-22 DIAGNOSIS — E113291 Type 2 diabetes mellitus with mild nonproliferative diabetic retinopathy without macular edema, right eye: Secondary | ICD-10-CM

## 2017-10-22 DIAGNOSIS — E11311 Type 2 diabetes mellitus with unspecified diabetic retinopathy with macular edema: Secondary | ICD-10-CM | POA: Diagnosis not present

## 2017-10-22 DIAGNOSIS — I10 Essential (primary) hypertension: Secondary | ICD-10-CM

## 2017-10-28 ENCOUNTER — Other Ambulatory Visit: Payer: Self-pay | Admitting: Internal Medicine

## 2017-10-28 DIAGNOSIS — R590 Localized enlarged lymph nodes: Secondary | ICD-10-CM

## 2017-11-11 ENCOUNTER — Ambulatory Visit
Admission: RE | Admit: 2017-11-11 | Discharge: 2017-11-11 | Disposition: A | Discharge status: Home / Self Care | Payer: Medicare Other | Source: Ambulatory Visit | Attending: Internal Medicine | Admitting: Internal Medicine

## 2017-11-11 ENCOUNTER — Other Ambulatory Visit: Payer: Self-pay

## 2017-11-11 DIAGNOSIS — R590 Localized enlarged lymph nodes: Secondary | ICD-10-CM

## 2017-11-19 ENCOUNTER — Encounter (INDEPENDENT_AMBULATORY_CARE_PROVIDER_SITE_OTHER): Payer: Medicare Other | Admitting: Ophthalmology

## 2017-11-19 DIAGNOSIS — H35033 Hypertensive retinopathy, bilateral: Secondary | ICD-10-CM

## 2017-11-19 DIAGNOSIS — H353231 Exudative age-related macular degeneration, bilateral, with active choroidal neovascularization: Secondary | ICD-10-CM

## 2017-11-19 DIAGNOSIS — H43813 Vitreous degeneration, bilateral: Secondary | ICD-10-CM | POA: Diagnosis not present

## 2017-11-19 DIAGNOSIS — I10 Essential (primary) hypertension: Secondary | ICD-10-CM | POA: Diagnosis not present

## 2017-11-19 DIAGNOSIS — E113312 Type 2 diabetes mellitus with moderate nonproliferative diabetic retinopathy with macular edema, left eye: Secondary | ICD-10-CM | POA: Diagnosis not present

## 2017-11-19 DIAGNOSIS — E11311 Type 2 diabetes mellitus with unspecified diabetic retinopathy with macular edema: Secondary | ICD-10-CM | POA: Diagnosis not present

## 2017-11-19 DIAGNOSIS — E113291 Type 2 diabetes mellitus with mild nonproliferative diabetic retinopathy without macular edema, right eye: Secondary | ICD-10-CM | POA: Diagnosis not present

## 2017-12-17 ENCOUNTER — Encounter (INDEPENDENT_AMBULATORY_CARE_PROVIDER_SITE_OTHER): Payer: Medicare Other | Admitting: Ophthalmology

## 2017-12-17 DIAGNOSIS — H353231 Exudative age-related macular degeneration, bilateral, with active choroidal neovascularization: Secondary | ICD-10-CM

## 2017-12-17 DIAGNOSIS — H43813 Vitreous degeneration, bilateral: Secondary | ICD-10-CM | POA: Diagnosis not present

## 2017-12-17 DIAGNOSIS — E113312 Type 2 diabetes mellitus with moderate nonproliferative diabetic retinopathy with macular edema, left eye: Secondary | ICD-10-CM

## 2017-12-17 DIAGNOSIS — E113291 Type 2 diabetes mellitus with mild nonproliferative diabetic retinopathy without macular edema, right eye: Secondary | ICD-10-CM | POA: Diagnosis not present

## 2017-12-17 DIAGNOSIS — I10 Essential (primary) hypertension: Secondary | ICD-10-CM | POA: Diagnosis not present

## 2017-12-17 DIAGNOSIS — E11311 Type 2 diabetes mellitus with unspecified diabetic retinopathy with macular edema: Secondary | ICD-10-CM

## 2017-12-17 DIAGNOSIS — H35033 Hypertensive retinopathy, bilateral: Secondary | ICD-10-CM

## 2018-01-14 ENCOUNTER — Encounter (INDEPENDENT_AMBULATORY_CARE_PROVIDER_SITE_OTHER): Payer: Medicare Other | Admitting: Ophthalmology

## 2018-01-14 DIAGNOSIS — H43813 Vitreous degeneration, bilateral: Secondary | ICD-10-CM

## 2018-01-14 DIAGNOSIS — H35033 Hypertensive retinopathy, bilateral: Secondary | ICD-10-CM

## 2018-01-14 DIAGNOSIS — E113291 Type 2 diabetes mellitus with mild nonproliferative diabetic retinopathy without macular edema, right eye: Secondary | ICD-10-CM

## 2018-01-14 DIAGNOSIS — H353231 Exudative age-related macular degeneration, bilateral, with active choroidal neovascularization: Secondary | ICD-10-CM

## 2018-01-14 DIAGNOSIS — I10 Essential (primary) hypertension: Secondary | ICD-10-CM | POA: Diagnosis not present

## 2018-01-14 DIAGNOSIS — E11311 Type 2 diabetes mellitus with unspecified diabetic retinopathy with macular edema: Secondary | ICD-10-CM | POA: Diagnosis not present

## 2018-01-14 DIAGNOSIS — E113212 Type 2 diabetes mellitus with mild nonproliferative diabetic retinopathy with macular edema, left eye: Secondary | ICD-10-CM | POA: Diagnosis not present

## 2018-02-11 ENCOUNTER — Encounter (INDEPENDENT_AMBULATORY_CARE_PROVIDER_SITE_OTHER): Payer: Medicare Other | Admitting: Ophthalmology

## 2018-02-11 DIAGNOSIS — H35033 Hypertensive retinopathy, bilateral: Secondary | ICD-10-CM | POA: Diagnosis not present

## 2018-02-11 DIAGNOSIS — H353231 Exudative age-related macular degeneration, bilateral, with active choroidal neovascularization: Secondary | ICD-10-CM | POA: Diagnosis not present

## 2018-02-11 DIAGNOSIS — E113293 Type 2 diabetes mellitus with mild nonproliferative diabetic retinopathy without macular edema, bilateral: Secondary | ICD-10-CM | POA: Diagnosis not present

## 2018-02-11 DIAGNOSIS — E11319 Type 2 diabetes mellitus with unspecified diabetic retinopathy without macular edema: Secondary | ICD-10-CM | POA: Diagnosis not present

## 2018-02-11 DIAGNOSIS — H43813 Vitreous degeneration, bilateral: Secondary | ICD-10-CM | POA: Diagnosis not present

## 2018-02-11 DIAGNOSIS — I10 Essential (primary) hypertension: Secondary | ICD-10-CM | POA: Diagnosis not present

## 2018-03-10 ENCOUNTER — Other Ambulatory Visit: Payer: Self-pay | Admitting: Internal Medicine

## 2018-03-10 ENCOUNTER — Ambulatory Visit
Admission: RE | Admit: 2018-03-10 | Discharge: 2018-03-10 | Disposition: A | Discharge status: Home / Self Care | Payer: Medicare Other | Source: Ambulatory Visit | Attending: Internal Medicine | Admitting: Internal Medicine

## 2018-03-10 DIAGNOSIS — J441 Chronic obstructive pulmonary disease with (acute) exacerbation: Secondary | ICD-10-CM

## 2018-03-11 ENCOUNTER — Encounter (INDEPENDENT_AMBULATORY_CARE_PROVIDER_SITE_OTHER): Payer: Medicare Other | Admitting: Ophthalmology

## 2018-03-11 DIAGNOSIS — E11319 Type 2 diabetes mellitus with unspecified diabetic retinopathy without macular edema: Secondary | ICD-10-CM

## 2018-03-11 DIAGNOSIS — E113293 Type 2 diabetes mellitus with mild nonproliferative diabetic retinopathy without macular edema, bilateral: Secondary | ICD-10-CM | POA: Diagnosis not present

## 2018-03-11 DIAGNOSIS — I10 Essential (primary) hypertension: Secondary | ICD-10-CM | POA: Diagnosis not present

## 2018-03-11 DIAGNOSIS — H353231 Exudative age-related macular degeneration, bilateral, with active choroidal neovascularization: Secondary | ICD-10-CM | POA: Diagnosis not present

## 2018-03-11 DIAGNOSIS — H35033 Hypertensive retinopathy, bilateral: Secondary | ICD-10-CM

## 2018-03-11 DIAGNOSIS — H43813 Vitreous degeneration, bilateral: Secondary | ICD-10-CM

## 2018-03-17 ENCOUNTER — Other Ambulatory Visit: Payer: Self-pay | Admitting: Internal Medicine

## 2018-03-17 DIAGNOSIS — I509 Heart failure, unspecified: Secondary | ICD-10-CM

## 2018-03-17 DIAGNOSIS — R0600 Dyspnea, unspecified: Secondary | ICD-10-CM

## 2018-03-24 ENCOUNTER — Other Ambulatory Visit: Payer: Self-pay

## 2018-03-24 ENCOUNTER — Ambulatory Visit (HOSPITAL_COMMUNITY): Payer: Medicare Other | Attending: Cardiovascular Disease

## 2018-03-24 DIAGNOSIS — I130 Hypertensive heart and chronic kidney disease with heart failure and stage 1 through stage 4 chronic kidney disease, or unspecified chronic kidney disease: Secondary | ICD-10-CM | POA: Diagnosis not present

## 2018-03-24 DIAGNOSIS — I420 Dilated cardiomyopathy: Secondary | ICD-10-CM | POA: Diagnosis not present

## 2018-03-24 DIAGNOSIS — Z951 Presence of aortocoronary bypass graft: Secondary | ICD-10-CM | POA: Insufficient documentation

## 2018-03-24 DIAGNOSIS — N189 Chronic kidney disease, unspecified: Secondary | ICD-10-CM | POA: Diagnosis not present

## 2018-03-24 DIAGNOSIS — I509 Heart failure, unspecified: Secondary | ICD-10-CM | POA: Diagnosis not present

## 2018-03-24 DIAGNOSIS — Z8249 Family history of ischemic heart disease and other diseases of the circulatory system: Secondary | ICD-10-CM | POA: Diagnosis not present

## 2018-03-24 DIAGNOSIS — E785 Hyperlipidemia, unspecified: Secondary | ICD-10-CM | POA: Insufficient documentation

## 2018-03-24 DIAGNOSIS — I255 Ischemic cardiomyopathy: Secondary | ICD-10-CM | POA: Insufficient documentation

## 2018-03-24 DIAGNOSIS — I2510 Atherosclerotic heart disease of native coronary artery without angina pectoris: Secondary | ICD-10-CM | POA: Insufficient documentation

## 2018-03-24 DIAGNOSIS — R0600 Dyspnea, unspecified: Secondary | ICD-10-CM | POA: Insufficient documentation

## 2018-03-24 DIAGNOSIS — E1122 Type 2 diabetes mellitus with diabetic chronic kidney disease: Secondary | ICD-10-CM | POA: Insufficient documentation

## 2018-03-24 DIAGNOSIS — Z87891 Personal history of nicotine dependence: Secondary | ICD-10-CM | POA: Diagnosis not present

## 2018-03-24 DIAGNOSIS — J449 Chronic obstructive pulmonary disease, unspecified: Secondary | ICD-10-CM | POA: Diagnosis not present

## 2018-04-09 ENCOUNTER — Encounter: Payer: Self-pay | Admitting: Physician Assistant

## 2018-04-15 ENCOUNTER — Encounter (INDEPENDENT_AMBULATORY_CARE_PROVIDER_SITE_OTHER): Payer: Medicare Other | Admitting: Ophthalmology

## 2018-04-15 DIAGNOSIS — H35033 Hypertensive retinopathy, bilateral: Secondary | ICD-10-CM

## 2018-04-15 DIAGNOSIS — H43813 Vitreous degeneration, bilateral: Secondary | ICD-10-CM | POA: Diagnosis not present

## 2018-04-15 DIAGNOSIS — E113293 Type 2 diabetes mellitus with mild nonproliferative diabetic retinopathy without macular edema, bilateral: Secondary | ICD-10-CM

## 2018-04-15 DIAGNOSIS — H353231 Exudative age-related macular degeneration, bilateral, with active choroidal neovascularization: Secondary | ICD-10-CM

## 2018-04-15 DIAGNOSIS — I10 Essential (primary) hypertension: Secondary | ICD-10-CM | POA: Diagnosis not present

## 2018-04-15 DIAGNOSIS — E11319 Type 2 diabetes mellitus with unspecified diabetic retinopathy without macular edema: Secondary | ICD-10-CM

## 2018-04-27 NOTE — Progress Notes (Signed)
Cardiology Office Note    Date:  04/28/2018   ID:  [name], DOB 09/11/1941, MRN [id number]  PCP:  Seward Carol, MD  Cardiologist: No primary care provider on file.  Chief Complaint  Patient presents with  . Follow-up    History of Present Illness:  [name] is a 77 y.o. male with history of CAD status post CABG x3 in 2002, ischemic cardiomyopathy last nuclear study 2013 EF 43%, hypertension, HLD, DM, CKD, COPD.  Has not seen Dr. Radford Pax since 2016.  Last saw Dr. Richardson Dopp, PA-C 02/2016.    Was hospitalized 02/2017 for pneumonia.  Patient is referred to [place] by Dr. Delfina Redwood for evaluation of CHF.  Patient had COPD exacerbation versus CHF 03/10/2018 with a BNP of 120.   He was placed on Lasix 40 mg daily and lost 6 pounds. last saw Dr. Delfina Redwood 03/31/2018 and was back to baseline. 2D echo was done EF 45 to 50% with probable hypokinesis of the mid apical lateral septal and apical myocardium consistent with ischemia in the distribution of the LAD, grade 1 DD.  Repeat study with Definity recommended as it was limited.  Patient comes in today alone.  He had several month history of orthopnea and leg edema that has improved with Lasix.  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  He exercises 3 or 4 days a week for about 20 to 30 minutes.  He denies any chest pain, tightness, pressure dizziness or presyncope.  He is currently on an inhaler study for his COPD and has noticed his heart has been going faster since he has been on the study.  This study should and next month.  Lipids managed by PCP LDL was 80 211/2018.  Patient admits to eating out almost daily.  He does not salt his food but does get a lot of salt in his diet.    Past Medical History:  Diagnosis Date  . Anxiety   . CAD (coronary artery disease)     s/p CABG with LIMA to LAD, SVG to diag, SVG to OM, SVG to  RCA cath 2002 Dr. Radford Pax  . Cataract   . CHF (congestive heart failure) ([place])   . Chronic kidney disease   . Claudication ([place])    normal ABIs  . COPD (chronic obstructive pulmonary disease) ([place])   . Coronary atherosclerosis of native coronary artery 2002   s/p MI with Ischemic CM EF 40% with apical akinesis at cath 2002 intolerant to ACE inhibitors and ARBs - EF 47% by nuclear stress test 08/2010 Dr. Radford Pax   . Depression   . Diabetes mellitus without complication ([place])   . Dyslipidemia   . Hypercholesteremia   . Hypersomnia   . Hypertension   . 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[place])    EF 40% with apical AK at cath intolerant to ACE I and ARBS, EF 43% by nuclear stress test 08/2010  . Kidney stones   . Macular degeneration     Past Surgical History:  Procedure Laterality Date  . CORONARY ARTERY BYPASS GRAFT     with LIMA to LAD, SVG to diag, SVG to OM, SVG to RCA cath 2002 Dr. Radford Pax  . urologic procedure for fertility      Current Medications: Current Meds  Medication Sig  . albuterol (PROVENTIL HFA;VENTOLIN HFA)  108 (90 Base) MCG/ACT inhaler Inhale 1-2 puffs into the lungs every 6 (six) hours as needed for wheezing or shortness of breath.  Marland Kitchen amLODipine (NORVASC) 10 MG tablet Take 1 tablet (10 mg total) by mouth daily. *Patient must call and schedule an appointment for further refills 2nd attempt*  . aspirin EC 81 MG tablet Take 81 mg by mouth daily.  Marland Kitchen Besifloxacin HCl (BESIVANCE) 0.6 % SUSP Apply 4 drops to eye. 4 drops in each eye twice a month. After eye injection.  Marland Kitchen escitalopram (LEXAPRO) 10 MG tablet Take 1 tablet by mouth daily.  . furosemide (LASIX) 40 MG tablet TK 2 TS PO FOR 3 DAYS THEN TK 1/2 T D PO FOR 30 DAYS  . guaiFENesin-dextromethorphan (ROBITUSSIN DM) 100-10 MG/5ML syrup Take 5 mLs by mouth every 4 (four) hours as needed for cough.  . metFORMIN (GLUCOPHAGE) 500 MG tablet Take by mouth 2 (two) times daily with a meal.  . metoprolol succinate (TOPROL-XL) 25 MG  24 hr tablet TAKE 1 TABLET BY MOUTH ONCE DAILY  . Multiple Vitamin (MULTIVITAMIN) capsule Take 1 capsule by mouth daily.  Marland Kitchen SIMBRINZA 1-0.2 % SUSP Place 1 drop into the left eye 2 (two) times daily.   . simvastatin (ZOCOR) 20 MG tablet TAKE 1 TABLET BY MOUTH DAILY. OVERDUE OFFICE VISIT. CALL FOR APPOINTMENT AND FURTHER REFILLS  . simvastatin (ZOCOR) 40 MG tablet TK 1 T PO QD IN THE EVE FOR CHOLESTEROL  . SYMBICORT 160-4.5 MCG/ACT inhaler Inhale 2 puffs into the lungs 2 (two) times daily.      Allergies:   Patient has no known allergies.   Social History   Socioeconomic History  . Marital status: Married    Spouse name: Not on file  . Number of children: Not on file  . Years of education: Not on file  . Highest education level: Not on file  Occupational History  . Not on file  Social Needs  . Financial resource strain: Not on file  . Food insecurity:    Worry: Not on file    Inability: Not on file  . Transportation needs:    Medical: Not on file    Non-medical: Not on file  Tobacco Use  . Smoking status: Former Smoker    Last attempt to quit: 10/25/2001    Years since quitting: 16.5  . Smokeless tobacco: Never Used  Substance and Sexual Activity  . Alcohol use: Yes    Comment: occasional beer and wine  . Drug use: No  . Sexual activity: Not on file  Lifestyle  . Physical activity:    Days per week: Not on file    Minutes per session: Not on file  . Stress: Not on file  Relationships  . Social connections:    Talks on phone: Not on file    Gets together: Not on file    Attends religious service: Not on file    Active member of club or organization: Not on file    Attends meetings of clubs or organizations: Not on file    Relationship status: Not on file  Other Topics Concern  . Not on file  Social History Narrative  . Not on file     Family History:  The patient's family history includes Breast cancer in his father and sister; CAD in his sister; CVA in his  father; Diabetes in his brother, mother, sister, and sister; Heart disease in his brother and sister; Hypertension in his father, mother, sister, and  sister.   ROS:   Please see the history of present illness.    Review of Systems  Constitution: Negative.  HENT: Negative.   Eyes: Positive for visual disturbance.  Cardiovascular: Positive for leg swelling and orthopnea.  Respiratory: Positive for shortness of breath and wheezing.   Endocrine: Negative.   Hematologic/Lymphatic: Bruises/bleeds easily.  Musculoskeletal: Negative.   Gastrointestinal: Negative.   Genitourinary: Negative.   Neurological: Negative.    All other systems reviewed and are negative.   PHYSICAL EXAM:   VS:  BP 132/80 (BP Location: Right Arm, Patient Position: Sitting, Cuff Size: Normal)   Pulse 100   Ht '5\' 7"'  (1.702 m)   Wt 248 lb (112.5 kg)   SpO2 96%   BMI 38.84 kg/m   Physical Exam  GEN: Obese, in no acute distress  Neck: no JVD, carotid bruits, or masses Cardiac:RRR; positive S4 no murmurs, rubs Respiratory: Decreased breath sounds but clear to auscultation bilaterally, normal work of breathing GI: soft, nontender, nondistended, + BS Ext: Trace of ankle edema without cyanosis, clubbing,  Good distal pulses bilaterally Neuro:  Alert and Oriented x 3 Psych: euthymic mood, full affect  Wt Readings from Last 3 Encounters:  04/28/18 248 lb (112.5 kg)  03/04/17 251 lb 9.6 oz (114.1 kg)  02/27/16 237 lb 12.8 oz (107.9 kg)      Studies/Labs Reviewed:   EKG:  EKG is ordered today.  The ekg ordered today demonstrates normal sinus rhythm at 97 bpm poor R wave progression anteriorly, no acute change, no change from prior tracing 02/2017  Recent Labs: No results found for requested labs within last 8760 hours.   Lipid Panel    Component Value Date/Time   CHOL 179 04/02/2016 1117   TRIG 60 04/02/2016 1117   HDL 79 04/02/2016 1117   CHOLHDL 2.3 04/02/2016 1117   VLDL 12 04/02/2016 1117   LDLCALC 88  04/02/2016 1117   LDLDIRECT 169.5 10/25/2013 1201    Additional studies/ records that were reviewed today include:  2D echo 03/24/2018 Study Conclusions   - Left ventricle: The cavity size was normal. Wall thickness was   normal. Systolic function was mildly reduced. The estimated   ejection fraction was in the range of 45% to 50%. Probable   hypokinesis of the mid-apicalanteroseptal and apical myocardium;   consistent with ischemia in the distribution of the left anterior   descending coronary artery. Doppler parameters are consistent   with abnormal left ventricular relaxation (grade 1 diastolic   dysfunction). Doppler parameters are consistent with elevated   mean left atrial filling pressure. - Ventricular septum: Septal motion showed paradox. These changes   are consistent with a post-thoracotomy state. - Mitral valve: Calcified annulus. Mildly thickened leaflets . - Left atrium: The atrium was severely dilated. - Right ventricle: The cavity size was mildly dilated. Systolic   function was mildly reduced. - Pulmonary arteries: Systolic pressure was mildly increased. PA   peak pressure: 33 mm Hg (S).   Recommendations:  Repeat study with Definity should be considered. Images are technically difficult, limiting the confidence in wall motion analysis.    ASSESSMENT:    1. Atherosclerosis of native coronary artery of native heart without angina pectoris   2. 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[place])   3. Chronic combined systolic and diastolic CHF (congestive heart failure) ([place])   4. Essential hypertension   5. Dyslipidemia   6. Morbid obesity ([place])      PLAN:  In order of problems listed above:  CAD status post CABG x3 in 2002.  Last nuclear stress test 2013 low risk.  Patient without chest pain.  Has had recent heart failure but suspect its diet related.  Recent echo did not show significant change in LV function.  Continue aspirin, Toprol and amlodipine.  Ischemic  cardiomyopathy ejection fraction 43% on nuclear study in 2013.  Most recent echo LVEF 45 to 50% recent problem with acute on chronic combined systolic and diastolic CHF.  2 g sodium diet discussed in detail with patient.  Continue Lasix 40 mg daily.  Check be met today may need potassium added.  Chronic combined systolic and diastolic CHF pretty well compensated on exam today.  Hypertension well controlled  Hyperlipidemia patient's on Zocor 20 mg daily.  LDL not quite at goal on last check.  LDL was 82.  Recommend LDL less than 70.  Age by PCP.  Morbid obesity weight loss program such as weight watchers recommended to the patient    Medication Adjustments/Labs and Tests Ordered: Current medicines are reviewed at length with the patient today.  Concerns regarding medicines are outlined above.  Medication changes, Labs and Tests ordered today are listed in the Patient Instructions below. Patient Instructions  Medication Instructions:  Your physician recommends that you continue on your current medications as directed. Please refer to the Current Medication list given to you today.  Labwork: Your physician recommends that you have lab work done today- BMET  Testing/Procedures: NONE  Follow-Up: Your physician wants you to follow-up in: 12 months with Dr. Radford Pax. You will receive a reminder letter in the mail two months in advance. If you don't receive a letter, please call our office to schedule the follow-up appointment.  Your physician would like you to join something like Weight Watchers to help with weight loss.  Your physician also wants you to give our office a call if after the COPD study you are still tachycardia (heart rate over 100).  If you need a refill on your cardiac medications before your next appointment, please call your pharmacy.    Low-Sodium Eating Plan Sodium, which is an element that makes up salt, helps you maintain a healthy balance of fluids in your body. Too  much sodium can increase your blood pressure and cause fluid and waste to be held in your body. Your health care provider or dietitian may recommend following this plan if you have high blood pressure (hypertension), kidney disease, liver disease, or heart failure. Eating less sodium can help lower your blood pressure, reduce swelling, and protect your heart, liver, and kidneys. What are tips for following this plan? General guidelines  Most people on this plan should limit their sodium intake to 1,500-2,000 mg (milligrams) of sodium each day. Reading food labels  The Nutrition Facts label lists the amount of sodium in one serving of the food. If you eat more than one serving, you must multiply the listed amount of sodium by the number of servings.  Choose foods with less than 140 mg of sodium per serving.  Avoid foods with 300 mg of sodium or more per serving. Shopping  Look for lower-sodium products, often labeled as "low-sodium" or "no salt added."  Always check the sodium content even if foods are labeled as "unsalted" or "no salt added".  Buy fresh foods. ? Avoid canned foods and premade or frozen meals. ? Avoid canned, cured, or processed meats  Buy breads that have less than 80 mg of sodium per slice. Cooking  Eat  more home-cooked food and less restaurant, buffet, and fast food.  Avoid adding salt when cooking. Use salt-free seasonings or herbs instead of table salt or sea salt. Check with your health care provider or pharmacist before using salt substitutes.  Cook with plant-based oils, such as canola, sunflower, or olive oil. Meal planning  When eating at a restaurant, ask that your food be prepared with less salt or no salt, if possible.  Avoid foods that contain MSG (monosodium glutamate). MSG is sometimes added to [place] food, bouillon, and some canned foods. What foods are recommended? The items listed may not be a complete list. Talk with your dietitian about what  dietary choices are best for you. Grains Low-sodium cereals, including oats, puffed wheat and rice, and shredded wheat. Low-sodium crackers. Unsalted rice. Unsalted pasta. Low-sodium bread. Whole-grain breads and whole-grain pasta. Vegetables Fresh or frozen vegetables. "No salt added" canned vegetables. "No salt added" tomato sauce and paste. Low-sodium or reduced-sodium tomato and vegetable juice. Fruits Fresh, frozen, or canned fruit. Fruit juice. Meats and other protein foods Fresh or frozen (no salt added) meat, poultry, seafood, and fish. Low-sodium canned tuna and salmon. Unsalted nuts. Dried peas, beans, and lentils without added salt. Unsalted canned beans. Eggs. Unsalted nut butters. Dairy Milk. Soy milk. Cheese that is naturally low in sodium, such as ricotta cheese, fresh mozzarella, or Swiss cheese Low-sodium or reduced-sodium cheese. Cream cheese. Yogurt. Fats and oils Unsalted butter. Unsalted margarine with no trans fat. Vegetable oils such as canola or olive oils. Seasonings and other foods Fresh and dried herbs and spices. Salt-free seasonings. Low-sodium mustard and ketchup. Sodium-free salad dressing. Sodium-free light mayonnaise. Fresh or refrigerated horseradish. Lemon juice. Vinegar. Homemade, reduced-sodium, or low-sodium soups. Unsalted popcorn and pretzels. Low-salt or salt-free chips. What foods are not recommended? The items listed may not be a complete list. Talk with your dietitian about what dietary choices are best for you. Grains Instant hot cereals. Bread stuffing, pancake, and biscuit mixes. Croutons. Seasoned rice or pasta mixes. Noodle soup cups. Boxed or frozen macaroni and cheese. Regular salted crackers. Self-rising flour. Vegetables Sauerkraut, pickled vegetables, and relishes. Olives. [place] fries. Onion rings. Regular canned vegetables (not low-sodium or reduced-sodium). Regular canned tomato sauce and paste (not low-sodium or reduced-sodium). Regular  tomato and vegetable juice (not low-sodium or reduced-sodium). Frozen vegetables in sauces. Meats and other protein foods Meat or fish that is salted, canned, smoked, spiced, or pickled. Bacon, ham, sausage, hotdogs, corned beef, chipped beef, packaged lunch meats, salt pork, jerky, pickled herring, anchovies, regular canned tuna, sardines, salted nuts. Dairy Processed cheese and cheese spreads. Cheese curds. Blue cheese. Feta cheese. String cheese. Regular cottage cheese. Buttermilk. Canned milk. Fats and oils Salted butter. Regular margarine. Ghee. Bacon fat. Seasonings and other foods Onion salt, garlic salt, seasoned salt, table salt, and sea salt. Canned and packaged gravies. Worcestershire sauce. Tartar sauce. Barbecue sauce. Teriyaki sauce. Soy sauce, including reduced-sodium. Steak sauce. Fish sauce. Oyster sauce. Cocktail sauce. Horseradish that you find on the shelf. Regular ketchup and mustard. Meat flavorings and tenderizers. Bouillon cubes. Hot sauce and Tabasco sauce. Premade or packaged marinades. Premade or packaged taco seasonings. Relishes. Regular salad dressings. Salsa. Potato and tortilla chips. Corn chips and puffs. Salted popcorn and pretzels. Canned or dried soups. Pizza. Frozen entrees and pot pies. Summary  Eating less sodium can help lower your blood pressure, reduce swelling, and protect your heart, liver, and kidneys.  Most people on this plan should limit their sodium intake to 1,500-2,000  mg (milligrams) of sodium each day.  Canned, boxed, and frozen foods are high in sodium. Restaurant foods, fast foods, and pizza are also very high in sodium. You also get sodium by adding salt to food.  Try to cook at home, eat more fresh fruits and vegetables, and eat less fast food, canned, processed, or prepared foods. This information is not intended to replace advice given to you by your health care provider. Make sure you discuss any questions you have with your health care  provider. Document Released: 05/24/2002 Document Revised: 11/25/2016 Document Reviewed: 11/25/2016 Elsevier Interactive Patient Education  2018 [place], Ermalinda Barrios, [place]  04/28/2018 9:07 AM    [facility] [address], [place], [place]  [address] Phone: [phone]; Fax: [phone]

## 2018-04-28 ENCOUNTER — Encounter: Payer: Self-pay | Admitting: Physician Assistant

## 2018-04-28 ENCOUNTER — Ambulatory Visit (INDEPENDENT_AMBULATORY_CARE_PROVIDER_SITE_OTHER): Payer: Medicare Other | Admitting: Physician Assistant

## 2018-04-28 VITALS — BP 132/80 | HR 100 | Ht 67.0 in | Wt 248.0 lb

## 2018-04-28 DIAGNOSIS — I10 Essential (primary) hypertension: Secondary | ICD-10-CM | POA: Diagnosis not present

## 2018-04-28 DIAGNOSIS — I420 Dilated cardiomyopathy: Secondary | ICD-10-CM

## 2018-04-28 DIAGNOSIS — I5042 Chronic combined systolic (congestive) and diastolic (congestive) heart failure: Secondary | ICD-10-CM | POA: Insufficient documentation

## 2018-04-28 DIAGNOSIS — I255 Ischemic cardiomyopathy: Secondary | ICD-10-CM | POA: Diagnosis not present

## 2018-04-28 DIAGNOSIS — I2510 Atherosclerotic heart disease of native coronary artery without angina pectoris: Secondary | ICD-10-CM | POA: Diagnosis not present

## 2018-04-28 DIAGNOSIS — E785 Hyperlipidemia, unspecified: Secondary | ICD-10-CM | POA: Diagnosis not present

## 2018-04-28 HISTORY — DX: Morbid (severe) obesity due to excess calories: E66.01

## 2018-04-28 HISTORY — DX: Chronic combined systolic (congestive) and diastolic (congestive) heart failure: I50.42

## 2018-04-28 LAB — BASIC METABOLIC PANEL
BUN / CREAT RATIO: 17 (ref 10–24)
BUN: 14 mg/dL (ref 8–27)
CO2: 23 mmol/L (ref 20–29)
Calcium: 9.4 mg/dL (ref 8.6–10.2)
Chloride: 100 mmol/L (ref 96–106)
Creatinine, Ser: 0.84 mg/dL (ref 0.76–1.27)
GFR calc Af Amer: 100 mL/min/{1.73_m2} (ref >59)
GFR calc non Af Amer: 86 mL/min/{1.73_m2} (ref >59)
GLUCOSE: 124 mg/dL — AB (ref 65–99)
POTASSIUM: 4.1 mmol/L (ref 3.5–5.2)
SODIUM: 140 mmol/L (ref 134–144)

## 2018-04-28 NOTE — Patient Instructions (Addendum)
Medication Instructions:  Your physician recommends that you continue on your current medications as directed. Please refer to the Current Medication list given to you today.  Labwork: Your physician recommends that you have lab work done today- BMET  Testing/Procedures: NONE  Follow-Up: Your physician wants you to follow-up in: 12 months with Dr. Radford Pax. You will receive a reminder letter in the mail two months in advance. If you don't receive a letter, please call our office to schedule the follow-up appointment.  Your physician would like you to join something like Weight Watchers to help with weight loss.  Your physician also wants you to give our office a call if after the COPD study you are still tachycardia (heart rate over 100).  If you need a refill on your cardiac medications before your next appointment, please call your pharmacy.    Low-Sodium Eating Plan Sodium, which is an element that makes up salt, helps you maintain a healthy balance of fluids in your body. Too much sodium can increase your blood pressure and cause fluid and waste to be held in your body. Your health care provider or dietitian may recommend following this plan if you have high blood pressure (hypertension), kidney disease, liver disease, or heart failure. Eating less sodium can help lower your blood pressure, reduce swelling, and protect your heart, liver, and kidneys. What are tips for following this plan? General guidelines  Most people on this plan should limit their sodium intake to 1,500-2,000 mg (milligrams) of sodium each day. Reading food labels  The Nutrition Facts label lists the amount of sodium in one serving of the food. If you eat more than one serving, you must multiply the listed amount of sodium by the number of servings.  Choose foods with less than 140 mg of sodium per serving.  Avoid foods with 300 mg of sodium or more per serving. Shopping  Look for lower-sodium products,  often labeled as "low-sodium" or "no salt added."  Always check the sodium content even if foods are labeled as "unsalted" or "no salt added".  Buy fresh foods. ? Avoid canned foods and premade or frozen meals. ? Avoid canned, cured, or processed meats  Buy breads that have less than 80 mg of sodium per slice. Cooking  Eat more home-cooked food and less restaurant, buffet, and fast food.  Avoid adding salt when cooking. Use salt-free seasonings or herbs instead of table salt or sea salt. Check with your health care provider or pharmacist before using salt substitutes.  Cook with plant-based oils, such as canola, sunflower, or olive oil. Meal planning  When eating at a restaurant, ask that your food be prepared with less salt or no salt, if possible.  Avoid foods that contain MSG (monosodium glutamate). MSG is sometimes added to [place] food, bouillon, and some canned foods. What foods are recommended? The items listed may not be a complete list. Talk with your dietitian about what dietary choices are best for you. Grains Low-sodium cereals, including oats, puffed wheat and rice, and shredded wheat. Low-sodium crackers. Unsalted rice. Unsalted pasta. Low-sodium bread. Whole-grain breads and whole-grain pasta. Vegetables Fresh or frozen vegetables. "No salt added" canned vegetables. "No salt added" tomato sauce and paste. Low-sodium or reduced-sodium tomato and vegetable juice. Fruits Fresh, frozen, or canned fruit. Fruit juice. Meats and other protein foods Fresh or frozen (no salt added) meat, poultry, seafood, and fish. Low-sodium canned tuna and salmon. Unsalted nuts. Dried peas, beans, and lentils without added salt. Unsalted canned  beans. Eggs. Unsalted nut butters. Dairy Milk. Soy milk. Cheese that is naturally low in sodium, such as ricotta cheese, fresh mozzarella, or Swiss cheese Low-sodium or reduced-sodium cheese. Cream cheese. Yogurt. Fats and oils Unsalted butter.  Unsalted margarine with no trans fat. Vegetable oils such as canola or olive oils. Seasonings and other foods Fresh and dried herbs and spices. Salt-free seasonings. Low-sodium mustard and ketchup. Sodium-free salad dressing. Sodium-free light mayonnaise. Fresh or refrigerated horseradish. Lemon juice. Vinegar. Homemade, reduced-sodium, or low-sodium soups. Unsalted popcorn and pretzels. Low-salt or salt-free chips. What foods are not recommended? The items listed may not be a complete list. Talk with your dietitian about what dietary choices are best for you. Grains Instant hot cereals. Bread stuffing, pancake, and biscuit mixes. Croutons. Seasoned rice or pasta mixes. Noodle soup cups. Boxed or frozen macaroni and cheese. Regular salted crackers. Self-rising flour. Vegetables Sauerkraut, pickled vegetables, and relishes. Olives. [place] fries. Onion rings. Regular canned vegetables (not low-sodium or reduced-sodium). Regular canned tomato sauce and paste (not low-sodium or reduced-sodium). Regular tomato and vegetable juice (not low-sodium or reduced-sodium). Frozen vegetables in sauces. Meats and other protein foods Meat or fish that is salted, canned, smoked, spiced, or pickled. Bacon, ham, sausage, hotdogs, corned beef, chipped beef, packaged lunch meats, salt pork, jerky, pickled herring, anchovies, regular canned tuna, sardines, salted nuts. Dairy Processed cheese and cheese spreads. Cheese curds. Blue cheese. Feta cheese. String cheese. Regular cottage cheese. Buttermilk. Canned milk. Fats and oils Salted butter. Regular margarine. Ghee. Bacon fat. Seasonings and other foods Onion salt, garlic salt, seasoned salt, table salt, and sea salt. Canned and packaged gravies. Worcestershire sauce. Tartar sauce. Barbecue sauce. Teriyaki sauce. Soy sauce, including reduced-sodium. Steak sauce. Fish sauce. Oyster sauce. Cocktail sauce. Horseradish that you find on the shelf. Regular ketchup and mustard.  Meat flavorings and tenderizers. Bouillon cubes. Hot sauce and Tabasco sauce. Premade or packaged marinades. Premade or packaged taco seasonings. Relishes. Regular salad dressings. Salsa. Potato and tortilla chips. Corn chips and puffs. Salted popcorn and pretzels. Canned or dried soups. Pizza. Frozen entrees and pot pies. Summary  Eating less sodium can help lower your blood pressure, reduce swelling, and protect your heart, liver, and kidneys.  Most people on this plan should limit their sodium intake to 1,500-2,000 mg (milligrams) of sodium each day.  Canned, boxed, and frozen foods are high in sodium. Restaurant foods, fast foods, and pizza are also very high in sodium. You also get sodium by adding salt to food.  Try to cook at home, eat more fresh fruits and vegetables, and eat less fast food, canned, processed, or prepared foods. This information is not intended to replace advice given to you by your health care provider. Make sure you discuss any questions you have with your health care provider. Document Released: 05/24/2002 Document Revised: 11/25/2016 Document Reviewed: 11/25/2016 Elsevier Interactive Patient Education  [employer].

## 2018-04-29 NOTE — Progress Notes (Signed)
Pt has been made aware of normal result and verbalized understanding.  jw 04/29/18

## 2018-05-13 ENCOUNTER — Encounter (INDEPENDENT_AMBULATORY_CARE_PROVIDER_SITE_OTHER): Payer: Medicare Other | Admitting: Ophthalmology

## 2018-05-13 DIAGNOSIS — H353231 Exudative age-related macular degeneration, bilateral, with active choroidal neovascularization: Secondary | ICD-10-CM | POA: Diagnosis not present

## 2018-05-13 DIAGNOSIS — I10 Essential (primary) hypertension: Secondary | ICD-10-CM

## 2018-05-13 DIAGNOSIS — H43813 Vitreous degeneration, bilateral: Secondary | ICD-10-CM | POA: Diagnosis not present

## 2018-05-13 DIAGNOSIS — H35033 Hypertensive retinopathy, bilateral: Secondary | ICD-10-CM

## 2018-05-13 DIAGNOSIS — E113293 Type 2 diabetes mellitus with mild nonproliferative diabetic retinopathy without macular edema, bilateral: Secondary | ICD-10-CM | POA: Diagnosis not present

## 2018-05-13 DIAGNOSIS — E11319 Type 2 diabetes mellitus with unspecified diabetic retinopathy without macular edema: Secondary | ICD-10-CM | POA: Diagnosis not present

## 2018-06-10 ENCOUNTER — Encounter (INDEPENDENT_AMBULATORY_CARE_PROVIDER_SITE_OTHER): Payer: Medicare Other | Admitting: Ophthalmology

## 2018-06-10 DIAGNOSIS — H43813 Vitreous degeneration, bilateral: Secondary | ICD-10-CM | POA: Diagnosis not present

## 2018-06-10 DIAGNOSIS — H35033 Hypertensive retinopathy, bilateral: Secondary | ICD-10-CM | POA: Diagnosis not present

## 2018-06-10 DIAGNOSIS — E11311 Type 2 diabetes mellitus with unspecified diabetic retinopathy with macular edema: Secondary | ICD-10-CM

## 2018-06-10 DIAGNOSIS — I10 Essential (primary) hypertension: Secondary | ICD-10-CM

## 2018-06-10 DIAGNOSIS — H353231 Exudative age-related macular degeneration, bilateral, with active choroidal neovascularization: Secondary | ICD-10-CM

## 2018-06-10 DIAGNOSIS — E113291 Type 2 diabetes mellitus with mild nonproliferative diabetic retinopathy without macular edema, right eye: Secondary | ICD-10-CM

## 2018-06-10 DIAGNOSIS — E113212 Type 2 diabetes mellitus with mild nonproliferative diabetic retinopathy with macular edema, left eye: Secondary | ICD-10-CM | POA: Diagnosis not present

## 2018-07-08 ENCOUNTER — Encounter (INDEPENDENT_AMBULATORY_CARE_PROVIDER_SITE_OTHER): Payer: Medicare Other | Admitting: Ophthalmology

## 2018-07-08 DIAGNOSIS — H353231 Exudative age-related macular degeneration, bilateral, with active choroidal neovascularization: Secondary | ICD-10-CM | POA: Diagnosis not present

## 2018-07-08 DIAGNOSIS — H43813 Vitreous degeneration, bilateral: Secondary | ICD-10-CM

## 2018-07-08 DIAGNOSIS — I10 Essential (primary) hypertension: Secondary | ICD-10-CM | POA: Diagnosis not present

## 2018-07-08 DIAGNOSIS — E113312 Type 2 diabetes mellitus with moderate nonproliferative diabetic retinopathy with macular edema, left eye: Secondary | ICD-10-CM

## 2018-07-08 DIAGNOSIS — E11311 Type 2 diabetes mellitus with unspecified diabetic retinopathy with macular edema: Secondary | ICD-10-CM

## 2018-07-08 DIAGNOSIS — H35033 Hypertensive retinopathy, bilateral: Secondary | ICD-10-CM | POA: Diagnosis not present

## 2018-07-08 DIAGNOSIS — E113391 Type 2 diabetes mellitus with moderate nonproliferative diabetic retinopathy without macular edema, right eye: Secondary | ICD-10-CM

## 2018-08-05 ENCOUNTER — Encounter (INDEPENDENT_AMBULATORY_CARE_PROVIDER_SITE_OTHER): Payer: Medicare Other | Admitting: Ophthalmology

## 2018-08-05 DIAGNOSIS — E11311 Type 2 diabetes mellitus with unspecified diabetic retinopathy with macular edema: Secondary | ICD-10-CM

## 2018-08-05 DIAGNOSIS — I10 Essential (primary) hypertension: Secondary | ICD-10-CM

## 2018-08-05 DIAGNOSIS — H353231 Exudative age-related macular degeneration, bilateral, with active choroidal neovascularization: Secondary | ICD-10-CM | POA: Diagnosis not present

## 2018-08-05 DIAGNOSIS — E113291 Type 2 diabetes mellitus with mild nonproliferative diabetic retinopathy without macular edema, right eye: Secondary | ICD-10-CM

## 2018-08-05 DIAGNOSIS — H35033 Hypertensive retinopathy, bilateral: Secondary | ICD-10-CM | POA: Diagnosis not present

## 2018-08-05 DIAGNOSIS — E113212 Type 2 diabetes mellitus with mild nonproliferative diabetic retinopathy with macular edema, left eye: Secondary | ICD-10-CM

## 2018-08-05 DIAGNOSIS — H43813 Vitreous degeneration, bilateral: Secondary | ICD-10-CM

## 2018-09-02 ENCOUNTER — Encounter (INDEPENDENT_AMBULATORY_CARE_PROVIDER_SITE_OTHER): Payer: Medicare Other | Admitting: Ophthalmology

## 2018-09-02 DIAGNOSIS — H43813 Vitreous degeneration, bilateral: Secondary | ICD-10-CM | POA: Diagnosis not present

## 2018-09-02 DIAGNOSIS — I10 Essential (primary) hypertension: Secondary | ICD-10-CM | POA: Diagnosis not present

## 2018-09-02 DIAGNOSIS — H35033 Hypertensive retinopathy, bilateral: Secondary | ICD-10-CM | POA: Diagnosis not present

## 2018-09-02 DIAGNOSIS — H353231 Exudative age-related macular degeneration, bilateral, with active choroidal neovascularization: Secondary | ICD-10-CM | POA: Diagnosis not present

## 2018-09-30 ENCOUNTER — Encounter (INDEPENDENT_AMBULATORY_CARE_PROVIDER_SITE_OTHER): Payer: Medicare Other | Admitting: Ophthalmology

## 2018-09-30 DIAGNOSIS — H35033 Hypertensive retinopathy, bilateral: Secondary | ICD-10-CM | POA: Diagnosis not present

## 2018-09-30 DIAGNOSIS — H43813 Vitreous degeneration, bilateral: Secondary | ICD-10-CM

## 2018-09-30 DIAGNOSIS — H353231 Exudative age-related macular degeneration, bilateral, with active choroidal neovascularization: Secondary | ICD-10-CM | POA: Diagnosis not present

## 2018-09-30 DIAGNOSIS — I10 Essential (primary) hypertension: Secondary | ICD-10-CM

## 2018-10-28 ENCOUNTER — Encounter (INDEPENDENT_AMBULATORY_CARE_PROVIDER_SITE_OTHER): Payer: Medicare Other | Admitting: Ophthalmology

## 2018-10-28 DIAGNOSIS — H43813 Vitreous degeneration, bilateral: Secondary | ICD-10-CM

## 2018-10-28 DIAGNOSIS — H35033 Hypertensive retinopathy, bilateral: Secondary | ICD-10-CM

## 2018-10-28 DIAGNOSIS — H353231 Exudative age-related macular degeneration, bilateral, with active choroidal neovascularization: Secondary | ICD-10-CM

## 2018-10-28 DIAGNOSIS — I10 Essential (primary) hypertension: Secondary | ICD-10-CM | POA: Diagnosis not present

## 2018-11-25 ENCOUNTER — Encounter (INDEPENDENT_AMBULATORY_CARE_PROVIDER_SITE_OTHER): Payer: Medicare Other | Admitting: Ophthalmology

## 2018-11-25 DIAGNOSIS — H35033 Hypertensive retinopathy, bilateral: Secondary | ICD-10-CM | POA: Diagnosis not present

## 2018-11-25 DIAGNOSIS — I10 Essential (primary) hypertension: Secondary | ICD-10-CM

## 2018-11-25 DIAGNOSIS — H353231 Exudative age-related macular degeneration, bilateral, with active choroidal neovascularization: Secondary | ICD-10-CM | POA: Diagnosis not present

## 2018-11-25 DIAGNOSIS — H43813 Vitreous degeneration, bilateral: Secondary | ICD-10-CM | POA: Diagnosis not present

## 2019-01-06 ENCOUNTER — Encounter (INDEPENDENT_AMBULATORY_CARE_PROVIDER_SITE_OTHER): Payer: Medicare Other | Admitting: Ophthalmology

## 2019-01-06 DIAGNOSIS — H35033 Hypertensive retinopathy, bilateral: Secondary | ICD-10-CM | POA: Diagnosis not present

## 2019-01-06 DIAGNOSIS — I10 Essential (primary) hypertension: Secondary | ICD-10-CM

## 2019-01-06 DIAGNOSIS — H43813 Vitreous degeneration, bilateral: Secondary | ICD-10-CM | POA: Diagnosis not present

## 2019-01-06 DIAGNOSIS — H353231 Exudative age-related macular degeneration, bilateral, with active choroidal neovascularization: Secondary | ICD-10-CM | POA: Diagnosis not present

## 2019-01-20 ENCOUNTER — Encounter (INDEPENDENT_AMBULATORY_CARE_PROVIDER_SITE_OTHER): Payer: Medicare Other | Admitting: Ophthalmology

## 2019-02-03 ENCOUNTER — Other Ambulatory Visit: Payer: Self-pay | Admitting: Nurse Practitioner

## 2019-02-03 ENCOUNTER — Ambulatory Visit
Admission: RE | Admit: 2019-02-03 | Discharge: 2019-02-03 | Disposition: A | Discharge status: Home / Self Care | Payer: Medicare Other | Source: Ambulatory Visit | Attending: Nurse Practitioner | Admitting: Nurse Practitioner

## 2019-02-03 DIAGNOSIS — J441 Chronic obstructive pulmonary disease with (acute) exacerbation: Secondary | ICD-10-CM

## 2019-02-17 ENCOUNTER — Encounter (INDEPENDENT_AMBULATORY_CARE_PROVIDER_SITE_OTHER): Payer: Medicare Other | Admitting: Ophthalmology

## 2019-02-17 DIAGNOSIS — H353231 Exudative age-related macular degeneration, bilateral, with active choroidal neovascularization: Secondary | ICD-10-CM | POA: Diagnosis not present

## 2019-02-17 DIAGNOSIS — I10 Essential (primary) hypertension: Secondary | ICD-10-CM | POA: Diagnosis not present

## 2019-02-17 DIAGNOSIS — H43813 Vitreous degeneration, bilateral: Secondary | ICD-10-CM | POA: Diagnosis not present

## 2019-02-17 DIAGNOSIS — H35033 Hypertensive retinopathy, bilateral: Secondary | ICD-10-CM

## 2019-03-31 ENCOUNTER — Other Ambulatory Visit: Payer: Self-pay

## 2019-03-31 ENCOUNTER — Encounter (INDEPENDENT_AMBULATORY_CARE_PROVIDER_SITE_OTHER): Payer: Medicare Other | Admitting: Ophthalmology

## 2019-03-31 DIAGNOSIS — H353231 Exudative age-related macular degeneration, bilateral, with active choroidal neovascularization: Secondary | ICD-10-CM | POA: Diagnosis not present

## 2019-03-31 DIAGNOSIS — I10 Essential (primary) hypertension: Secondary | ICD-10-CM | POA: Diagnosis not present

## 2019-03-31 DIAGNOSIS — H35033 Hypertensive retinopathy, bilateral: Secondary | ICD-10-CM | POA: Diagnosis not present

## 2019-03-31 DIAGNOSIS — H43813 Vitreous degeneration, bilateral: Secondary | ICD-10-CM | POA: Diagnosis not present

## 2019-05-12 ENCOUNTER — Encounter (INDEPENDENT_AMBULATORY_CARE_PROVIDER_SITE_OTHER): Payer: Medicare Other | Admitting: Ophthalmology

## 2019-05-12 ENCOUNTER — Other Ambulatory Visit: Payer: Self-pay

## 2019-05-12 DIAGNOSIS — I10 Essential (primary) hypertension: Secondary | ICD-10-CM | POA: Diagnosis not present

## 2019-05-12 DIAGNOSIS — H35033 Hypertensive retinopathy, bilateral: Secondary | ICD-10-CM

## 2019-05-12 DIAGNOSIS — H353231 Exudative age-related macular degeneration, bilateral, with active choroidal neovascularization: Secondary | ICD-10-CM

## 2019-05-12 DIAGNOSIS — H43813 Vitreous degeneration, bilateral: Secondary | ICD-10-CM | POA: Diagnosis not present

## 2019-06-23 ENCOUNTER — Other Ambulatory Visit: Payer: Self-pay

## 2019-06-23 ENCOUNTER — Encounter (INDEPENDENT_AMBULATORY_CARE_PROVIDER_SITE_OTHER): Payer: Medicare Other | Admitting: Ophthalmology

## 2019-06-23 DIAGNOSIS — I10 Essential (primary) hypertension: Secondary | ICD-10-CM

## 2019-06-23 DIAGNOSIS — H43813 Vitreous degeneration, bilateral: Secondary | ICD-10-CM

## 2019-06-23 DIAGNOSIS — H35033 Hypertensive retinopathy, bilateral: Secondary | ICD-10-CM | POA: Diagnosis not present

## 2019-06-23 DIAGNOSIS — H353231 Exudative age-related macular degeneration, bilateral, with active choroidal neovascularization: Secondary | ICD-10-CM

## 2019-08-04 ENCOUNTER — Encounter (INDEPENDENT_AMBULATORY_CARE_PROVIDER_SITE_OTHER): Payer: Medicare Other | Admitting: Ophthalmology

## 2019-08-04 ENCOUNTER — Other Ambulatory Visit: Payer: Self-pay

## 2019-08-04 DIAGNOSIS — H43813 Vitreous degeneration, bilateral: Secondary | ICD-10-CM

## 2019-08-04 DIAGNOSIS — I10 Essential (primary) hypertension: Secondary | ICD-10-CM | POA: Diagnosis not present

## 2019-08-04 DIAGNOSIS — H353231 Exudative age-related macular degeneration, bilateral, with active choroidal neovascularization: Secondary | ICD-10-CM

## 2019-08-04 DIAGNOSIS — H35033 Hypertensive retinopathy, bilateral: Secondary | ICD-10-CM

## 2019-09-15 ENCOUNTER — Encounter (INDEPENDENT_AMBULATORY_CARE_PROVIDER_SITE_OTHER): Payer: Medicare Other | Admitting: Ophthalmology

## 2019-09-29 ENCOUNTER — Encounter (INDEPENDENT_AMBULATORY_CARE_PROVIDER_SITE_OTHER): Payer: Medicare Other | Admitting: Ophthalmology

## 2019-09-29 ENCOUNTER — Other Ambulatory Visit: Payer: Self-pay

## 2019-09-29 DIAGNOSIS — H353231 Exudative age-related macular degeneration, bilateral, with active choroidal neovascularization: Secondary | ICD-10-CM | POA: Diagnosis not present

## 2019-09-29 DIAGNOSIS — H35033 Hypertensive retinopathy, bilateral: Secondary | ICD-10-CM | POA: Diagnosis not present

## 2019-09-29 DIAGNOSIS — H43813 Vitreous degeneration, bilateral: Secondary | ICD-10-CM

## 2019-09-29 DIAGNOSIS — I10 Essential (primary) hypertension: Secondary | ICD-10-CM | POA: Diagnosis not present

## 2019-11-03 ENCOUNTER — Encounter (INDEPENDENT_AMBULATORY_CARE_PROVIDER_SITE_OTHER): Payer: Medicare Other | Admitting: Ophthalmology

## 2019-11-03 DIAGNOSIS — H353231 Exudative age-related macular degeneration, bilateral, with active choroidal neovascularization: Secondary | ICD-10-CM

## 2019-11-03 DIAGNOSIS — I10 Essential (primary) hypertension: Secondary | ICD-10-CM

## 2019-11-03 DIAGNOSIS — H43813 Vitreous degeneration, bilateral: Secondary | ICD-10-CM | POA: Diagnosis not present

## 2019-11-03 DIAGNOSIS — H35033 Hypertensive retinopathy, bilateral: Secondary | ICD-10-CM | POA: Diagnosis not present

## 2019-12-01 ENCOUNTER — Other Ambulatory Visit: Payer: Self-pay

## 2019-12-01 ENCOUNTER — Encounter (INDEPENDENT_AMBULATORY_CARE_PROVIDER_SITE_OTHER): Payer: Medicare Other | Admitting: Ophthalmology

## 2019-12-01 DIAGNOSIS — I10 Essential (primary) hypertension: Secondary | ICD-10-CM | POA: Diagnosis not present

## 2019-12-01 DIAGNOSIS — H353231 Exudative age-related macular degeneration, bilateral, with active choroidal neovascularization: Secondary | ICD-10-CM | POA: Diagnosis not present

## 2019-12-01 DIAGNOSIS — H35033 Hypertensive retinopathy, bilateral: Secondary | ICD-10-CM

## 2019-12-01 DIAGNOSIS — H43813 Vitreous degeneration, bilateral: Secondary | ICD-10-CM | POA: Diagnosis not present

## 2019-12-29 ENCOUNTER — Encounter (INDEPENDENT_AMBULATORY_CARE_PROVIDER_SITE_OTHER): Payer: Medicare Other | Admitting: Ophthalmology

## 2019-12-29 DIAGNOSIS — I10 Essential (primary) hypertension: Secondary | ICD-10-CM | POA: Diagnosis not present

## 2019-12-29 DIAGNOSIS — H353231 Exudative age-related macular degeneration, bilateral, with active choroidal neovascularization: Secondary | ICD-10-CM | POA: Diagnosis not present

## 2019-12-29 DIAGNOSIS — H43813 Vitreous degeneration, bilateral: Secondary | ICD-10-CM | POA: Diagnosis not present

## 2019-12-29 DIAGNOSIS — H35033 Hypertensive retinopathy, bilateral: Secondary | ICD-10-CM

## 2020-01-26 ENCOUNTER — Other Ambulatory Visit: Payer: Self-pay

## 2020-01-26 ENCOUNTER — Encounter (INDEPENDENT_AMBULATORY_CARE_PROVIDER_SITE_OTHER): Payer: Medicare Other | Admitting: Ophthalmology

## 2020-01-26 DIAGNOSIS — I10 Essential (primary) hypertension: Secondary | ICD-10-CM | POA: Diagnosis not present

## 2020-01-26 DIAGNOSIS — H43813 Vitreous degeneration, bilateral: Secondary | ICD-10-CM | POA: Diagnosis not present

## 2020-01-26 DIAGNOSIS — H35033 Hypertensive retinopathy, bilateral: Secondary | ICD-10-CM

## 2020-01-26 DIAGNOSIS — H353231 Exudative age-related macular degeneration, bilateral, with active choroidal neovascularization: Secondary | ICD-10-CM

## 2020-02-23 ENCOUNTER — Other Ambulatory Visit: Payer: Self-pay

## 2020-02-23 ENCOUNTER — Encounter (INDEPENDENT_AMBULATORY_CARE_PROVIDER_SITE_OTHER): Payer: Medicare Other | Admitting: Ophthalmology

## 2020-02-23 DIAGNOSIS — H43813 Vitreous degeneration, bilateral: Secondary | ICD-10-CM

## 2020-02-23 DIAGNOSIS — H353231 Exudative age-related macular degeneration, bilateral, with active choroidal neovascularization: Secondary | ICD-10-CM

## 2020-02-23 DIAGNOSIS — I10 Essential (primary) hypertension: Secondary | ICD-10-CM | POA: Diagnosis not present

## 2020-02-23 DIAGNOSIS — H35033 Hypertensive retinopathy, bilateral: Secondary | ICD-10-CM

## 2020-03-29 ENCOUNTER — Encounter (INDEPENDENT_AMBULATORY_CARE_PROVIDER_SITE_OTHER): Payer: Medicare Other | Admitting: Ophthalmology

## 2020-03-29 ENCOUNTER — Other Ambulatory Visit: Payer: Self-pay

## 2020-03-29 DIAGNOSIS — H353231 Exudative age-related macular degeneration, bilateral, with active choroidal neovascularization: Secondary | ICD-10-CM | POA: Diagnosis not present

## 2020-03-29 DIAGNOSIS — H35033 Hypertensive retinopathy, bilateral: Secondary | ICD-10-CM | POA: Diagnosis not present

## 2020-03-29 DIAGNOSIS — I10 Essential (primary) hypertension: Secondary | ICD-10-CM

## 2020-03-29 DIAGNOSIS — H43813 Vitreous degeneration, bilateral: Secondary | ICD-10-CM | POA: Diagnosis not present

## 2020-05-03 ENCOUNTER — Other Ambulatory Visit: Payer: Self-pay

## 2020-05-03 ENCOUNTER — Encounter (INDEPENDENT_AMBULATORY_CARE_PROVIDER_SITE_OTHER): Payer: Medicare Other | Admitting: Ophthalmology

## 2020-05-03 DIAGNOSIS — H43813 Vitreous degeneration, bilateral: Secondary | ICD-10-CM

## 2020-05-03 DIAGNOSIS — H35033 Hypertensive retinopathy, bilateral: Secondary | ICD-10-CM | POA: Diagnosis not present

## 2020-05-03 DIAGNOSIS — H353231 Exudative age-related macular degeneration, bilateral, with active choroidal neovascularization: Secondary | ICD-10-CM

## 2020-05-03 DIAGNOSIS — I10 Essential (primary) hypertension: Secondary | ICD-10-CM | POA: Diagnosis not present

## 2020-05-24 ENCOUNTER — Other Ambulatory Visit: Payer: Self-pay | Admitting: Internal Medicine

## 2020-05-24 DIAGNOSIS — I739 Peripheral vascular disease, unspecified: Secondary | ICD-10-CM

## 2020-06-05 ENCOUNTER — Ambulatory Visit
Admission: RE | Admit: 2020-06-05 | Discharge: 2020-06-05 | Disposition: A | Discharge status: Home / Self Care | Payer: Medicare Other | Source: Ambulatory Visit | Attending: Internal Medicine | Admitting: Internal Medicine

## 2020-06-05 DIAGNOSIS — I739 Peripheral vascular disease, unspecified: Secondary | ICD-10-CM

## 2020-06-07 ENCOUNTER — Other Ambulatory Visit: Payer: Self-pay

## 2020-06-07 ENCOUNTER — Encounter (INDEPENDENT_AMBULATORY_CARE_PROVIDER_SITE_OTHER): Payer: Medicare Other | Admitting: Ophthalmology

## 2020-06-07 DIAGNOSIS — H353231 Exudative age-related macular degeneration, bilateral, with active choroidal neovascularization: Secondary | ICD-10-CM | POA: Diagnosis not present

## 2020-06-07 DIAGNOSIS — H35033 Hypertensive retinopathy, bilateral: Secondary | ICD-10-CM | POA: Diagnosis not present

## 2020-06-07 DIAGNOSIS — H43813 Vitreous degeneration, bilateral: Secondary | ICD-10-CM

## 2020-06-07 DIAGNOSIS — I10 Essential (primary) hypertension: Secondary | ICD-10-CM

## 2020-06-26 ENCOUNTER — Encounter: Payer: Self-pay | Admitting: Surgery

## 2020-06-26 ENCOUNTER — Other Ambulatory Visit: Payer: Self-pay

## 2020-06-26 ENCOUNTER — Ambulatory Visit (INDEPENDENT_AMBULATORY_CARE_PROVIDER_SITE_OTHER): Payer: Medicare Other | Admitting: Surgery

## 2020-06-26 VITALS — BP 124/76 | HR 83 | Temp 97.9°F | Resp 20 | Ht 67.0 in | Wt 245.8 lb

## 2020-06-26 DIAGNOSIS — I70213 Atherosclerosis of native arteries of extremities with intermittent claudication, bilateral legs: Secondary | ICD-10-CM | POA: Diagnosis not present

## 2020-06-26 NOTE — Progress Notes (Signed)
[facility]  Patient name: [name] MRN: [id number] DOB: Mar 08, 1941 Sex: male   REQUESTING PROVIDER:    Seward Carol   REASON FOR CONSULT:    PAD  HISTORY OF PRESENT ILLNESS:   [name] is a 79 y.o. male, who is referred for evaluation of lower extremity PAD following lower extremity arterial duplex study.  The patient states that he will occasionally have cramps in his legs when he walks.  If he goes up a hill it is more rapid onset.  However he does state that usually his shortness of breath limits his walking rather than his legs.  He does not have any open wounds.  He does not have rest pain.  The patient has a history of coronary artery disease.  He is status post CABG x3 in 2002.  He has ischemic cardiomyopathy.  He also has a history of COPD secondary to tobacco abuse.  He is a diabetic.  He is medically managed for hypertension.  He takes a statin for hypercholesterolemia.  Last 0 arterial arterial exam  PAST MEDICAL HISTORY    Past Medical History:  Diagnosis Date  . Anxiety   . CAD (coronary artery disease)     s/p CABG with LIMA to LAD, SVG to diag, SVG to OM, SVG to RCA cath 2002 Dr. Radford Pax  . Cataract   . CHF (congestive heart failure) ([place])   . Chronic kidney disease   . Claudication ([place])    normal ABIs  . COPD (chronic obstructive pulmonary disease) ([place])   . Coronary atherosclerosis of native coronary artery 2002   s/p MI with Ischemic CM EF 40% with apical akinesis at cath 2002 intolerant to ACE inhibitors and ARBs - EF 47% by nuclear stress test 08/2010 Dr. Radford Pax   . Depression   . Diabetes mellitus without complication ([place])   . Dyslipidemia   . Hypercholesteremia   . Hypersomnia   . Hypertension   . 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 Kidney stones   . Macular  degeneration      FAMILY HISTORY   Family History  Problem Relation Age of Onset  . Hypertension Father   . Breast cancer Father   . CVA Father   . Diabetes Mother   . Hypertension Mother   . CAD Sister   . Hypertension Sister   . Diabetes Sister   . Heart disease Brother   . Diabetes Brother   . Breast cancer Sister   . Hypertension Sister   . Diabetes Sister   . Heart disease Sister     SOCIAL HISTORY:   Social History   Socioeconomic History  . Marital status: Married    Spouse name: Not on file  . Number of children: Not on file  . Years of education: Not on file  . Highest education level: Not on file  Occupational History  . Not on file  Tobacco Use  . Smoking status: Former Smoker    Quit date: 10/25/2001    Years since quitting: 18.6  . Smokeless tobacco: Never Used  Vaping Use  . Vaping Use: Never used  Substance and Sexual Activity  . Alcohol use: Yes    Comment: occasional beer and wine  . Drug use: No  . Sexual activity: Not on file  Other Topics Concern  .  Not on file  Social History Narrative  . Not on file   Social Determinants of Health   Financial Resource Strain:   . Difficulty of Paying Living Expenses:   Food Insecurity:   . Worried About Charity fundraiser in the Last Year:   . Arboriculturist in the Last Year:   Transportation Needs:   . Film/video editor (Medical):   Marland Kitchen Lack of Transportation (Non-Medical):   Physical Activity:   . Days of Exercise per Week:   . Minutes of Exercise per Session:   Stress:   . Feeling of Stress :   Social Connections:   . Frequency of Communication with Friends and Family:   . Frequency of Social Gatherings with Friends and Family:   . Attends Religious Services:   . Active Member of Clubs or Organizations:   . Attends Archivist Meetings:   Marland Kitchen Marital Status:   Intimate Partner Violence:   . Fear of Current or Ex-Partner:   . Emotionally Abused:   Marland Kitchen Physically Abused:     . Sexually Abused:     ALLERGIES:    No Known Allergies  CURRENT MEDICATIONS:    Current Outpatient Medications  Medication Sig Dispense Refill  . albuterol (PROVENTIL HFA;VENTOLIN HFA) 108 (90 Base) MCG/ACT inhaler Inhale 1-2 puffs into the lungs every 6 (six) hours as needed for wheezing or shortness of breath. 1 Inhaler 1  . amLODipine (NORVASC) 10 MG tablet Take 1 tablet (10 mg total) by mouth daily. *Patient must call and schedule an appointment for further refills 2nd attempt* 15 tablet 0  . aspirin EC 81 MG tablet Take 81 mg by mouth daily.    Marland Kitchen Besifloxacin HCl (BESIVANCE) 0.6 % SUSP Apply 4 drops to eye. 4 drops in each eye twice a month. After eye injection.    . dorzolamide (TRUSOPT) 2 % ophthalmic solution 1 drop 2 (two) times daily.    . empagliflozin (JARDIANCE) 10 MG TABS tablet Take by mouth daily.    Marland Kitchen escitalopram (LEXAPRO) 10 MG tablet Take 1 tablet by mouth daily.    . furosemide (LASIX) 40 MG tablet TK 2 TS PO FOR 3 DAYS THEN TK 1/2 T D PO FOR 30 DAYS  5  . metFORMIN (GLUCOPHAGE-XR) 500 MG 24 hr tablet Take 500 mg by mouth 2 (two) times daily.    . metoprolol succinate (TOPROL-XL) 25 MG 24 hr tablet TAKE 1 TABLET BY MOUTH ONCE DAILY 15 tablet 0  . Multiple Vitamin (MULTIVITAMIN) capsule Take 1 capsule by mouth daily.    . simvastatin (ZOCOR) 40 MG tablet TK 1 T PO QD IN THE EVE FOR CHOLESTEROL  3  . SYMBICORT 160-4.5 MCG/ACT inhaler Inhale 2 puffs into the lungs 2 (two) times daily.     Marland Kitchen guaiFENesin-dextromethorphan (ROBITUSSIN DM) 100-10 MG/5ML syrup Take 5 mLs by mouth every 4 (four) hours as needed for cough. (Patient not taking: Reported on 06/26/2020) 118 mL 0   No current facility-administered medications for this visit.    REVIEW OF SYSTEMS:   [X]  denotes positive finding, [ ]  denotes negative finding Cardiac  Comments:  Chest pain or chest pressure:    Shortness of breath upon exertion: x   Short of breath when lying flat:    Irregular heart  rhythm:        Vascular    Pain in calf, thigh, or hip brought on by ambulation: x   Pain in feet at night that  wakes you up from your sleep:     Blood clot in your veins:    Leg swelling:      x   Pulmonary    Oxygen at home:    Productive cough:     Wheezing:  x       Neurologic    Sudden weakness in arms or legs:     Sudden numbness in arms or legs:     Sudden onset of difficulty speaking or slurred speech:    Temporary loss of vision in one eye:     Problems with dizziness:         Gastrointestinal    Blood in stool:      Vomited blood:         Genitourinary    Burning when urinating:     Blood in urine:        Psychiatric    Major depression:         Hematologic    Bleeding problems:    Problems with blood clotting too easily:        Skin    Rashes or ulcers:        Constitutional    Fever or chills:     PHYSICAL EXAM:   Vitals:   06/26/20 1349  BP: 124/76  Pulse: 83  Resp: 20  Temp: 97.9 F (36.6 C)  SpO2: 94%  Weight: 245 lb 12.8 oz (111.5 kg)  Height: 5\' 7"  (1.702 m)    GENERAL: The patient is a well-nourished male, in no acute distress. The vital signs are documented above. CARDIAC: There is a regular rate and rhythm.  VASCULAR: Palpable pedal pulses at rest PULMONARY: Nonlabored respirations MUSCULOSKELETAL: There are no major deformities or cyanosis. NEUROLOGIC: No focal weakness or paresthesias are detected. SKIN: There are no ulcers or rashes noted. PSYCHIATRIC: The patient has a normal affect.  STUDIES:   I have reviewed the following: ABI:  Right: 1.02 Left:  0.9 1. Mild right and moderate left lower extremity arterial occlusive disease identified post exercise. 2. Absolute toe pressure is sufficient for wound healing bilaterally.   POST EXERCISE: Right:  Post Exercise ABI:0.84, returning to 1.01 after 3 minutes Left:  Post Exercise ABI:0.68, returning 20.85 after 3 minutes  ASSESSMENT and PLAN   Lower extremity  atherosclerotic vascular disease: At rest the patient has normal ABIs however with exercise they dropped with about a 3-minute recovery.  He does have an element of disease in his legs, therefore we will need to optimize his treatment from a medical perspective.  It appears that he is on all the appropriate medications.  I told her that I would not recommend intervention at this time because his shortness of breath is what usually limits his activity.  I discussed the importance of monitoring his feet on a daily basis and to contact me if he were to develop a nonhealing wound.  Otherwise he will follow-up in 1 year with ABIs.   Leia Alf, MD, FACS [facility] [phone] Pager [phone]

## 2020-07-11 ENCOUNTER — Encounter (INDEPENDENT_AMBULATORY_CARE_PROVIDER_SITE_OTHER): Payer: Medicare Other | Admitting: Ophthalmology

## 2020-07-11 ENCOUNTER — Other Ambulatory Visit: Payer: Self-pay

## 2020-07-11 DIAGNOSIS — H353231 Exudative age-related macular degeneration, bilateral, with active choroidal neovascularization: Secondary | ICD-10-CM | POA: Diagnosis not present

## 2020-07-11 DIAGNOSIS — H35033 Hypertensive retinopathy, bilateral: Secondary | ICD-10-CM | POA: Diagnosis not present

## 2020-07-11 DIAGNOSIS — H43813 Vitreous degeneration, bilateral: Secondary | ICD-10-CM | POA: Diagnosis not present

## 2020-07-11 DIAGNOSIS — I10 Essential (primary) hypertension: Secondary | ICD-10-CM | POA: Diagnosis not present

## 2020-08-04 NOTE — Progress Notes (Signed)
Cardiology Office Note:    Date:  08/07/2020   ID:  [name], DOB 1941/07/07, MRN [id number]  PCP:  Seward Carol, MD  Cardiologist:  Fransico Him, MD    Referring MD: Seward Carol, MD   Chief Complaint  Patient presents with  . Coronary Artery Disease  . Hypertension  . Hyperlipidemia  . Congestive Heart Failure    History of Present Illness:    [name] is a 79 y.o. male with a hx of CAD status post CABG x3 in 2002, ischemic cardiomyopathy last nuclear study 2013 EF 43%, hypertension, HLD, DM, chronic combined systolic/diastolic CHF,CKD, COPD.   2D echo 2019 was done showing EF 45 to 50% with probable hypokinesis of the mid apical lateral septal and apical myocardium consistent with ischemia in the distribution of the LAD, grade 1 DD. He was last seen by Estella Husk, PA in 2019 and was doing well.    He is here today for followup and is doing well.  He denies any chest pain or pressure,  PND, orthopnea, dizziness, palpitations or syncope. He has chronic DOE which is stable on inhalers and staying out of heat. He has chronic LE edema controlled on diuretics.  He is compliant with his meds and is tolerating meds with no SE.    Past Medical History:  Diagnosis Date  . Acute on chronic respiratory failure with hypoxia ([place]) 03/02/2017  . Acute respiratory failure with hypoxia ([place])   . Anxiety   . CAD (coronary artery disease)     s/p CABG with LIMA to LAD, SVG to diag, SVG to OM, SVG to RCA cath 2002 Dr. Radford Pax  . CAP (community acquired pneumonia) due to Pneumococcus ([place]) 03/03/2017  . Cataract   . CHF (congestive heart failure) ([place])   . Chronic combined systolic and diastolic CHF (congestive heart failure) ([place]) 04/28/2018  . Chronic kidney disease   . Claudication ([place])    normal ABIs  . COPD (chronic obstructive pulmonary disease) ([place])   . COPD with acute exacerbation ([place]) 03/02/2017  . Coronary atherosclerosis of native coronary artery 2002   s/p MI with  Ischemic CM EF 40% with apical akinesis at cath 2002 intolerant to ACE inhibitors and ARBs - EF 47% by nuclear stress test 08/2010 Dr. Radford Pax   . Depression   . Diabetes mellitus without complication ([place])   . Dyslipidemia   . Hypercholesteremia   . Hypersomnia   . Hypertension   . 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 Kidney stones   . Macular degeneration   . Morbid obesity ([place]) 04/28/2018    Past Surgical History:  Procedure Laterality Date  . CORONARY ARTERY BYPASS GRAFT     with LIMA to LAD, SVG to diag, SVG to OM, SVG to RCA cath 2002 Dr. Radford Pax  . urologic procedure for fertility      Current Medications: Current Meds  Medication Sig  . albuterol (PROVENTIL HFA;VENTOLIN HFA) 108 (90 Base) MCG/ACT inhaler Inhale 1-2 puffs into the lungs every 6 (six) hours as needed for wheezing or shortness of breath.  Marland Kitchen amLODipine (NORVASC) 10 MG tablet Take 10 mg by mouth daily.  Marland Kitchen aspirin EC 81 MG tablet Take 81 mg by mouth daily.  Marland Kitchen Besifloxacin HCl (BESIVANCE) 0.6 % SUSP Apply 4 drops to eye. 4 drops in each eye twice a month. After  eye injection.  . dorzolamide (TRUSOPT) 2 % ophthalmic solution Place 1 drop into both eyes 2 (two) times daily.   Marland Kitchen escitalopram (LEXAPRO) 10 MG tablet Take 1 tablet by mouth daily.  . furosemide (LASIX) 40 MG tablet Take 40 mg by mouth 3 (three) times a week.   . metFORMIN (GLUCOPHAGE-XR) 500 MG 24 hr tablet Take 500 mg by mouth 2 (two) times daily.  . metoprolol succinate (TOPROL-XL) 25 MG 24 hr tablet TAKE 1 TABLET BY MOUTH ONCE DAILY  . Multiple Vitamin (MULTIVITAMIN) capsule Take 1 capsule by mouth daily.  . simvastatin (ZOCOR) 40 MG tablet Take 40 mg by mouth daily.   . SYMBICORT 160-4.5 MCG/ACT inhaler Inhale 2 puffs into the lungs 2 (two) times daily.      Allergies:   Patient has no known allergies.   Social History   Socioeconomic History    . Marital status: Married    Spouse name: Not on file  . Number of children: Not on file  . Years of education: Not on file  . Highest education level: Not on file  Occupational History  . Not on file  Tobacco Use  . Smoking status: Former Smoker    Quit date: 10/25/2001    Years since quitting: 18.7  . Smokeless tobacco: Never Used  Vaping Use  . Vaping Use: Never used  Substance and Sexual Activity  . Alcohol use: Yes    Comment: occasional beer and wine  . Drug use: No  . Sexual activity: Not on file  Other Topics Concern  . Not on file  Social History Narrative  . Not on file   Social Determinants of Health   Financial Resource Strain:   . Difficulty of Paying Living Expenses: Not on file  Food Insecurity:   . Worried About Charity fundraiser in the Last Year: Not on file  . Ran Out of Food in the Last Year: Not on file  Transportation Needs:   . Lack of Transportation (Medical): Not on file  . Lack of Transportation (Non-Medical): Not on file  Physical Activity:   . Days of Exercise per Week: Not on file  . Minutes of Exercise per Session: Not on file  Stress:   . Feeling of Stress : Not on file  Social Connections:   . Frequency of Communication with Friends and Family: Not on file  . Frequency of Social Gatherings with Friends and Family: Not on file  . Attends Religious Services: Not on file  . Active Member of Clubs or Organizations: Not on file  . Attends Archivist Meetings: Not on file  . Marital Status: Not on file     Family History: The patient's family history includes Breast cancer in his father and sister; CAD in his sister; CVA in his father; Diabetes in his brother, mother, sister, and sister; Heart disease in his brother and sister; Hypertension in his father, mother, sister, and sister.  ROS:   Please see the history of present illness.    ROS  All other systems reviewed and negative.   EKGs/Labs/Other Studies Reviewed:     The following studies were reviewed today: Outside labs  EKG:  EKG is  ordered today.  The ekg ordered today demonstrates NSR with PVC, LVH by repol and lateral T wave abnormality  Recent Labs: No results found for requested labs within last 8760 hours.   Recent Lipid Panel    Component Value Date/Time   CHOL 179  04/02/2016 1117   TRIG 60 04/02/2016 1117   HDL 79 04/02/2016 1117   CHOLHDL 2.3 04/02/2016 1117   VLDL 12 04/02/2016 1117   LDLCALC 88 04/02/2016 1117   LDLDIRECT 169.5 10/25/2013 1201    Physical Exam:    VS:  BP 120/72   Pulse 98   Ht 5\' 7"  (1.702 m)   Wt 248 lb (112.5 kg)   SpO2 93%   BMI 38.84 kg/m     Wt Readings from Last 3 Encounters:  08/07/20 248 lb (112.5 kg)  06/26/20 245 lb 12.8 oz (111.5 kg)  04/28/18 248 lb (112.5 kg)     GEN:  Well nourished, well developed in no acute distress HEENT: Normal NECK: No JVD; No carotid bruits LYMPHATICS: No lymphadenopathy CARDIAC: RRR, no murmurs, rubs, gallops RESPIRATORY:  Clear to auscultation without rales, wheezing or rhonchi  ABDOMEN: Soft, non-tender, non-distended MUSCULOSKELETAL:  1+edema; No deformity  SKIN: Warm and dry NEUROLOGIC:  Alert and oriented x 3 PSYCHIATRIC:  Normal affect   ASSESSMENT:    1. Atherosclerosis of native coronary artery of native heart without angina pectoris   2. Chronic combined systolic and diastolic CHF (congestive heart failure) ([place])   3. 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 Essential hypertension   5. Hyperlipidemia LDL goal <70    PLAN:    In order of problems listed above:  1.  ASCAD -s/p remote CABG in 2002 -denies any anginal symptoms -continue ASA 81mg  daily, statin and BB  2.  Chronic combined systolic/diastolic CHF -he appears euvolemic on exam today -he has chronic trace edema >>I encouraged him to try to take the Lasix on the days he is not driving to keep the fluid off his legs and use compression hose -continue Toprol -SCr is  stable at 0.850 on labs in June  3. Ischemic DCM -EF 45-50% by echo 2019 -continue BB  4.  HTN -BP controlled on exam -continue amlodipine 10mg  daily,Toprol XL 25mg  daily  5.  HLD -LDL goal < 70 -continue simvastatin 40mg  daily   Medication Adjustments/Labs and Tests Ordered: Current medicines are reviewed at length with the patient today.  Concerns regarding medicines are outlined above.  Orders Placed This Encounter  Procedures  . EKG 12-Lead   No orders of the defined types were placed in this encounter.   Signed, Fransico Him, MD  08/07/2020 8:51 AM    [place]

## 2020-08-07 ENCOUNTER — Ambulatory Visit (INDEPENDENT_AMBULATORY_CARE_PROVIDER_SITE_OTHER): Payer: Medicare Other | Admitting: Cardiology

## 2020-08-07 ENCOUNTER — Encounter: Payer: Self-pay | Admitting: Cardiology

## 2020-08-07 ENCOUNTER — Other Ambulatory Visit: Payer: Self-pay

## 2020-08-07 VITALS — BP 120/72 | HR 98 | Ht 67.0 in | Wt 248.0 lb

## 2020-08-07 DIAGNOSIS — I10 Essential (primary) hypertension: Secondary | ICD-10-CM

## 2020-08-07 DIAGNOSIS — E785 Hyperlipidemia, unspecified: Secondary | ICD-10-CM

## 2020-08-07 DIAGNOSIS — I2510 Atherosclerotic heart disease of native coronary artery without angina pectoris: Secondary | ICD-10-CM

## 2020-08-07 DIAGNOSIS — I420 Dilated cardiomyopathy: Secondary | ICD-10-CM

## 2020-08-07 DIAGNOSIS — I255 Ischemic cardiomyopathy: Secondary | ICD-10-CM

## 2020-08-07 DIAGNOSIS — I5042 Chronic combined systolic (congestive) and diastolic (congestive) heart failure: Secondary | ICD-10-CM

## 2020-08-07 NOTE — Patient Instructions (Signed)
Medication Instructions:  Your physician recommends that you continue on your current medications as directed. Please refer to the Current Medication list given to you today.  *If you need a refill on your cardiac medications before your next appointment, please call your pharmacy*  Follow-Up: At [facility], you and your health needs are our priority.  As part of our continuing mission to provide you with exceptional heart care, we have created designated Provider Care Teams.  These Care Teams include your primary Cardiologist (physician) and Advanced Practice Providers (APPs -  Physician Assistants and Nurse Practitioners) who all work together to provide you with the care you need, when you need it.  We recommend signing up for the patient portal called "MyChart".  Sign up information is provided on this After Visit Summary.  MyChart is used to connect with patients for Virtual Visits (Telemedicine).  Patients are able to view lab/test results, encounter notes, upcoming appointments, etc.  Non-urgent messages can be sent to your provider as well.   To learn more about what you can do with MyChart, go to [email or web address].    Your next appointment:   6 month(s)  The format for your next appointment:   In Person  Provider:   You may see Traci Turner, MD or one of the following Advanced Practice Providers on your designated Care Team:    Dayna Dunn, PA-C  Michele Lenze, PA-C   

## 2020-08-15 ENCOUNTER — Other Ambulatory Visit: Payer: Self-pay

## 2020-08-15 ENCOUNTER — Encounter (INDEPENDENT_AMBULATORY_CARE_PROVIDER_SITE_OTHER): Payer: Medicare Other | Admitting: Ophthalmology

## 2020-08-15 DIAGNOSIS — H353231 Exudative age-related macular degeneration, bilateral, with active choroidal neovascularization: Secondary | ICD-10-CM

## 2020-08-15 DIAGNOSIS — I10 Essential (primary) hypertension: Secondary | ICD-10-CM | POA: Diagnosis not present

## 2020-08-15 DIAGNOSIS — H35033 Hypertensive retinopathy, bilateral: Secondary | ICD-10-CM | POA: Diagnosis not present

## 2020-08-15 DIAGNOSIS — H43813 Vitreous degeneration, bilateral: Secondary | ICD-10-CM | POA: Diagnosis not present

## 2020-09-19 ENCOUNTER — Encounter (INDEPENDENT_AMBULATORY_CARE_PROVIDER_SITE_OTHER): Payer: Medicare Other | Admitting: Ophthalmology

## 2020-09-27 ENCOUNTER — Encounter (INDEPENDENT_AMBULATORY_CARE_PROVIDER_SITE_OTHER): Payer: Medicare Other | Admitting: Ophthalmology

## 2020-09-27 ENCOUNTER — Other Ambulatory Visit: Payer: Self-pay

## 2020-09-27 DIAGNOSIS — I10 Essential (primary) hypertension: Secondary | ICD-10-CM | POA: Diagnosis not present

## 2020-09-27 DIAGNOSIS — H43813 Vitreous degeneration, bilateral: Secondary | ICD-10-CM | POA: Diagnosis not present

## 2020-09-27 DIAGNOSIS — H353231 Exudative age-related macular degeneration, bilateral, with active choroidal neovascularization: Secondary | ICD-10-CM | POA: Diagnosis not present

## 2020-09-27 DIAGNOSIS — H35033 Hypertensive retinopathy, bilateral: Secondary | ICD-10-CM

## 2020-10-24 ENCOUNTER — Encounter (INDEPENDENT_AMBULATORY_CARE_PROVIDER_SITE_OTHER): Payer: Medicare Other | Admitting: Ophthalmology

## 2020-10-24 ENCOUNTER — Other Ambulatory Visit: Payer: Self-pay

## 2020-10-24 DIAGNOSIS — H353231 Exudative age-related macular degeneration, bilateral, with active choroidal neovascularization: Secondary | ICD-10-CM

## 2020-10-24 DIAGNOSIS — H35033 Hypertensive retinopathy, bilateral: Secondary | ICD-10-CM

## 2020-10-24 DIAGNOSIS — I10 Essential (primary) hypertension: Secondary | ICD-10-CM | POA: Diagnosis not present

## 2020-10-24 DIAGNOSIS — H43813 Vitreous degeneration, bilateral: Secondary | ICD-10-CM | POA: Diagnosis not present

## 2020-11-22 ENCOUNTER — Encounter (INDEPENDENT_AMBULATORY_CARE_PROVIDER_SITE_OTHER): Payer: Medicare Other | Admitting: Ophthalmology

## 2020-11-22 ENCOUNTER — Other Ambulatory Visit: Payer: Self-pay

## 2020-11-22 DIAGNOSIS — H353231 Exudative age-related macular degeneration, bilateral, with active choroidal neovascularization: Secondary | ICD-10-CM

## 2020-11-22 DIAGNOSIS — H35033 Hypertensive retinopathy, bilateral: Secondary | ICD-10-CM | POA: Diagnosis not present

## 2020-11-22 DIAGNOSIS — I10 Essential (primary) hypertension: Secondary | ICD-10-CM | POA: Diagnosis not present

## 2020-11-22 DIAGNOSIS — H43813 Vitreous degeneration, bilateral: Secondary | ICD-10-CM

## 2020-12-19 ENCOUNTER — Encounter (INDEPENDENT_AMBULATORY_CARE_PROVIDER_SITE_OTHER): Payer: Medicare Other | Admitting: Ophthalmology

## 2020-12-19 ENCOUNTER — Other Ambulatory Visit: Payer: Self-pay

## 2020-12-19 DIAGNOSIS — H43813 Vitreous degeneration, bilateral: Secondary | ICD-10-CM | POA: Diagnosis not present

## 2020-12-19 DIAGNOSIS — H353231 Exudative age-related macular degeneration, bilateral, with active choroidal neovascularization: Secondary | ICD-10-CM | POA: Diagnosis not present

## 2020-12-19 DIAGNOSIS — H35033 Hypertensive retinopathy, bilateral: Secondary | ICD-10-CM

## 2020-12-19 DIAGNOSIS — I10 Essential (primary) hypertension: Secondary | ICD-10-CM

## 2021-01-16 ENCOUNTER — Other Ambulatory Visit: Payer: Self-pay

## 2021-01-16 ENCOUNTER — Encounter (INDEPENDENT_AMBULATORY_CARE_PROVIDER_SITE_OTHER): Payer: Medicare Other | Admitting: Ophthalmology

## 2021-01-16 DIAGNOSIS — I10 Essential (primary) hypertension: Secondary | ICD-10-CM

## 2021-01-16 DIAGNOSIS — H353231 Exudative age-related macular degeneration, bilateral, with active choroidal neovascularization: Secondary | ICD-10-CM

## 2021-01-16 DIAGNOSIS — H35033 Hypertensive retinopathy, bilateral: Secondary | ICD-10-CM | POA: Diagnosis not present

## 2021-01-16 DIAGNOSIS — H43813 Vitreous degeneration, bilateral: Secondary | ICD-10-CM

## 2021-02-09 ENCOUNTER — Other Ambulatory Visit: Payer: Self-pay | Admitting: Internal Medicine

## 2021-02-09 DIAGNOSIS — R1319 Other dysphagia: Secondary | ICD-10-CM

## 2021-02-12 ENCOUNTER — Ambulatory Visit
Admission: RE | Admit: 2021-02-12 | Discharge: 2021-02-12 | Disposition: A | Discharge status: Home / Self Care | Payer: Medicare Other | Source: Ambulatory Visit | Attending: Internal Medicine | Admitting: Internal Medicine

## 2021-02-12 ENCOUNTER — Other Ambulatory Visit: Payer: Self-pay | Admitting: Internal Medicine

## 2021-02-12 DIAGNOSIS — R1319 Other dysphagia: Secondary | ICD-10-CM

## 2021-02-13 ENCOUNTER — Encounter (INDEPENDENT_AMBULATORY_CARE_PROVIDER_SITE_OTHER): Payer: Medicare Other | Admitting: Ophthalmology

## 2021-02-13 ENCOUNTER — Other Ambulatory Visit: Payer: Self-pay

## 2021-02-13 DIAGNOSIS — I10 Essential (primary) hypertension: Secondary | ICD-10-CM | POA: Diagnosis not present

## 2021-02-13 DIAGNOSIS — H353231 Exudative age-related macular degeneration, bilateral, with active choroidal neovascularization: Secondary | ICD-10-CM | POA: Diagnosis not present

## 2021-02-13 DIAGNOSIS — H35033 Hypertensive retinopathy, bilateral: Secondary | ICD-10-CM | POA: Diagnosis not present

## 2021-02-13 DIAGNOSIS — H43813 Vitreous degeneration, bilateral: Secondary | ICD-10-CM | POA: Diagnosis not present

## 2021-03-13 ENCOUNTER — Encounter (INDEPENDENT_AMBULATORY_CARE_PROVIDER_SITE_OTHER): Payer: Medicare Other | Admitting: Ophthalmology

## 2021-03-13 ENCOUNTER — Other Ambulatory Visit: Payer: Self-pay

## 2021-03-13 DIAGNOSIS — H35033 Hypertensive retinopathy, bilateral: Secondary | ICD-10-CM

## 2021-03-13 DIAGNOSIS — H353231 Exudative age-related macular degeneration, bilateral, with active choroidal neovascularization: Secondary | ICD-10-CM

## 2021-03-13 DIAGNOSIS — H43813 Vitreous degeneration, bilateral: Secondary | ICD-10-CM | POA: Diagnosis not present

## 2021-03-13 DIAGNOSIS — I10 Essential (primary) hypertension: Secondary | ICD-10-CM | POA: Diagnosis not present

## 2021-04-10 ENCOUNTER — Encounter (INDEPENDENT_AMBULATORY_CARE_PROVIDER_SITE_OTHER): Payer: Medicare Other | Admitting: Ophthalmology

## 2021-04-10 ENCOUNTER — Other Ambulatory Visit: Payer: Self-pay

## 2021-04-10 DIAGNOSIS — H35033 Hypertensive retinopathy, bilateral: Secondary | ICD-10-CM | POA: Diagnosis not present

## 2021-04-10 DIAGNOSIS — I10 Essential (primary) hypertension: Secondary | ICD-10-CM | POA: Diagnosis not present

## 2021-04-10 DIAGNOSIS — H43813 Vitreous degeneration, bilateral: Secondary | ICD-10-CM | POA: Diagnosis not present

## 2021-04-10 DIAGNOSIS — H353231 Exudative age-related macular degeneration, bilateral, with active choroidal neovascularization: Secondary | ICD-10-CM | POA: Diagnosis not present

## 2021-05-08 ENCOUNTER — Encounter (INDEPENDENT_AMBULATORY_CARE_PROVIDER_SITE_OTHER): Payer: Medicare Other | Admitting: Ophthalmology

## 2021-05-08 ENCOUNTER — Other Ambulatory Visit: Payer: Self-pay

## 2021-05-08 DIAGNOSIS — I10 Essential (primary) hypertension: Secondary | ICD-10-CM | POA: Diagnosis not present

## 2021-05-08 DIAGNOSIS — H35033 Hypertensive retinopathy, bilateral: Secondary | ICD-10-CM

## 2021-05-08 DIAGNOSIS — H43813 Vitreous degeneration, bilateral: Secondary | ICD-10-CM | POA: Diagnosis not present

## 2021-05-08 DIAGNOSIS — H353231 Exudative age-related macular degeneration, bilateral, with active choroidal neovascularization: Secondary | ICD-10-CM

## 2021-06-05 ENCOUNTER — Encounter (INDEPENDENT_AMBULATORY_CARE_PROVIDER_SITE_OTHER): Payer: Medicare Other | Admitting: Ophthalmology

## 2021-07-10 ENCOUNTER — Other Ambulatory Visit: Payer: Self-pay

## 2021-07-10 ENCOUNTER — Encounter (INDEPENDENT_AMBULATORY_CARE_PROVIDER_SITE_OTHER): Payer: Medicare Other | Admitting: Ophthalmology

## 2021-07-10 DIAGNOSIS — H353231 Exudative age-related macular degeneration, bilateral, with active choroidal neovascularization: Secondary | ICD-10-CM | POA: Diagnosis not present

## 2021-07-10 DIAGNOSIS — H43813 Vitreous degeneration, bilateral: Secondary | ICD-10-CM

## 2021-07-10 DIAGNOSIS — I10 Essential (primary) hypertension: Secondary | ICD-10-CM

## 2021-07-10 DIAGNOSIS — H35033 Hypertensive retinopathy, bilateral: Secondary | ICD-10-CM

## 2021-08-21 ENCOUNTER — Other Ambulatory Visit: Payer: Self-pay

## 2021-08-21 ENCOUNTER — Encounter (INDEPENDENT_AMBULATORY_CARE_PROVIDER_SITE_OTHER): Payer: Medicare Other | Admitting: Ophthalmology

## 2021-08-21 DIAGNOSIS — H353231 Exudative age-related macular degeneration, bilateral, with active choroidal neovascularization: Secondary | ICD-10-CM | POA: Diagnosis not present

## 2021-08-21 DIAGNOSIS — H43813 Vitreous degeneration, bilateral: Secondary | ICD-10-CM

## 2021-08-21 DIAGNOSIS — H35033 Hypertensive retinopathy, bilateral: Secondary | ICD-10-CM | POA: Diagnosis not present

## 2021-08-21 DIAGNOSIS — I10 Essential (primary) hypertension: Secondary | ICD-10-CM

## 2021-08-31 ENCOUNTER — Other Ambulatory Visit: Payer: Self-pay

## 2021-08-31 DIAGNOSIS — I70213 Atherosclerosis of native arteries of extremities with intermittent claudication, bilateral legs: Secondary | ICD-10-CM

## 2021-09-05 ENCOUNTER — Other Ambulatory Visit: Payer: Self-pay

## 2021-09-05 ENCOUNTER — Ambulatory Visit (INDEPENDENT_AMBULATORY_CARE_PROVIDER_SITE_OTHER): Payer: Medicare Other | Admitting: Physician Assistant

## 2021-09-05 ENCOUNTER — Ambulatory Visit (HOSPITAL_COMMUNITY)
Admission: RE | Admit: 2021-09-05 | Discharge: 2021-09-05 | Disposition: A | Discharge status: Home / Self Care | Payer: Medicare Other | Source: Ambulatory Visit | Attending: Vascular Surgery | Admitting: Vascular Surgery

## 2021-09-05 VITALS — BP 141/79 | HR 91 | Temp 97.6°F | Resp 20 | Ht 67.0 in | Wt 209.3 lb

## 2021-09-05 DIAGNOSIS — I70213 Atherosclerosis of native arteries of extremities with intermittent claudication, bilateral legs: Secondary | ICD-10-CM | POA: Diagnosis not present

## 2021-09-05 NOTE — Progress Notes (Signed)
Office Note     CC:  follow up Requesting Provider:  Seward Carol, MD  HPI: [name] is a 80 y.o. ([birth year]) male who presents for routine follow up and non invasive studies. He was last seen by Dr. Trula Slade in July of 2021, at which time he had post exercise ABI's indicating some arterial disease bilaterally with drop in ABI's and 3 minutes to recover. He was already medically optimized on statin and Aspirin. No intervention was indicated. He was encourage to ambulate as tolerated as well as inspect his feet daily for wounds.   He returns today for follow up. He continues to get occasional cramps on ambulation especially if he has to go up an incline. His ambulation is still limited by his shortness of breath. He says he is able to mow his whole lawn without any discomfort in his legs. He does occasionally get a cramp in his calves but he takes a spoonful of mustard and it goes away. He denies any pain in his legs or feet at rest or that wakes him from sleep. He does get swelling in his legs, left greater than right. 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 A lot of time he has swelling after driving. He reports only wearing compression socks in the winter.   The pt is on a statin for cholesterol management.  The pt is on a daily aspirin.   Other AC:  none The pt is on CCB, BB, diuretic for hypertension.   The pt is diabetic.   Tobacco hx:  Former, 2002  Past Medical History:  Diagnosis Date   Anxiety    CAD (coronary artery disease)     s/p CABG with LIMA to LAD, SVG to diag, SVG to OM, SVG to RCA cath 2002 Dr. Radford Pax   CAP (community acquired pneumonia) due to Pneumococcus ([place]) 03/03/2017   Cataract    Chronic combined systolic and diastolic CHF (congestive heart failure) ([place]) 04/28/2018   Chronic kidney disease    Claudication (HCC)    normal ABIs   COPD (chronic obstructive pulmonary disease) ([place])    Depression     Diabetes mellitus without complication ([place])    Dyslipidemia    Hypersomnia    Hypertension    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HCC)    EF 40% with apical AK at cath intolerant to ACE I and ARBS, EF 43% by nuclear stress test 08/2010   Kidney stones    Macular degeneration    Morbid obesity ([place]) 04/28/2018    Past Surgical History:  Procedure Laterality Date   CORONARY ARTERY BYPASS GRAFT     with LIMA to LAD, SVG to diag, SVG to OM, SVG to RCA cath 2002 Dr. Radford Pax   urologic procedure for fertility      Social History   Socioeconomic History   Marital status: Married    Spouse name: Not on file   Number of children: Not on file   Years of education: Not on file   Highest education level: Not on file  Occupational History   Not on file  Tobacco Use   Smoking status: Former    Types: Cigarettes    Quit date: 10/25/2001    Years since quitting: 19.8   Smokeless tobacco: Never  Vaping Use   Vaping Use: Never used  Substance and Sexual Activity   Alcohol use: Yes    Comment: occasional beer and wine  Drug use: No   Sexual activity: Not on file  Other Topics Concern   Not on file  Social History Narrative   Not on file   Social Determinants of Health   Financial Resource Strain: Not on file  Food Insecurity: Not on file  Transportation Needs: Not on file  Physical Activity: Not on file  Stress: Not on file  Social Connections: Not on file  Intimate Partner Violence: Not on file    Family History  Problem Relation Age of Onset   Hypertension Father    Breast cancer Father    CVA Father    Diabetes Mother    Hypertension Mother    CAD Sister    Hypertension Sister    Diabetes Sister    Heart disease Brother    Diabetes Brother    Breast cancer Sister    Hypertension Sister    Diabetes Sister    Heart disease Sister     Current Outpatient Medications  Medication Sig Dispense Refill   albuterol (PROVENTIL HFA;VENTOLIN HFA) 108 (90 Base) MCG/ACT  inhaler Inhale 1-2 puffs into the lungs every 6 (six) hours as needed for wheezing or shortness of breath. 1 Inhaler 1   amLODipine (NORVASC) 10 MG tablet Take 10 mg by mouth daily.     aspirin EC 81 MG tablet Take 81 mg by mouth daily.     Besifloxacin HCl 0.6 % SUSP Apply 4 drops to eye. 4 drops in each eye twice a month. After eye injection.     dorzolamide (TRUSOPT) 2 % ophthalmic solution Place 1 drop into both eyes 2 (two) times daily.      escitalopram (LEXAPRO) 10 MG tablet Take 1 tablet by mouth daily.     furosemide (LASIX) 40 MG tablet Take 40 mg by mouth 3 (three) times a week.   5   metFORMIN (GLUCOPHAGE-XR) 500 MG 24 hr tablet Take 500 mg by mouth 2 (two) times daily.     metoprolol succinate (TOPROL-XL) 25 MG 24 hr tablet TAKE 1 TABLET BY MOUTH ONCE DAILY 15 tablet 0   Multiple Vitamin (MULTIVITAMIN) capsule Take 1 capsule by mouth daily.     simvastatin (ZOCOR) 40 MG tablet Take 40 mg by mouth daily.   3   SYMBICORT 160-4.5 MCG/ACT inhaler Inhale 2 puffs into the lungs 2 (two) times daily.      No current facility-administered medications for this visit.    No Known Allergies   REVIEW OF SYSTEMS:   [X]  denotes positive finding, [ ]  denotes negative finding Cardiac  Comments:  Chest pain or chest pressure:    Shortness of breath upon exertion: X   Short of breath when lying flat:    Irregular heart rhythm:        Vascular    Pain in calf, thigh, or hip brought on by ambulation:    Pain in feet at night that wakes you up from your sleep:     Blood clot in your veins:    Leg swelling:  X       Pulmonary    Oxygen at home:    Productive cough:     Wheezing:         Neurologic    Sudden weakness in arms or legs:     Sudden numbness in arms or legs:     Sudden onset of difficulty speaking or slurred speech:    Temporary loss of vision in one eye:     Problems  with dizziness:         Gastrointestinal    Blood in stool:     Vomited blood:          Genitourinary    Burning when urinating:     Blood in urine:        Psychiatric    Major depression:         Hematologic    Bleeding problems:    Problems with blood clotting too easily:        Skin    Rashes or ulcers:        Constitutional    Fever or chills:      PHYSICAL EXAMINATION:  Vitals:   09/05/21 1445  BP: (!) 141/79  Pulse: 91  Resp: 20  Temp: 97.6 F (36.4 C)  TempSrc: Temporal  SpO2: 95%  Weight: 209 lb 4.8 oz (94.9 kg)  Height: 5\' 7"  (1.702 m)    General:  WDWN in NAD; vital signs documented above Gait: Normal HENT: WNL, normocephalic Pulmonary: normal non-labored breathing , without wheezing Cardiac: regular HR, without  Murmurs without carotid bruit Abdomen: obese, soft, NT, no masses Vascular Exam/Pulses:  Right Left  Radial 2+ (normal) 2+ (normal)  Femoral 2+ (normal) 2+ (normal)  Popliteal 2+ (normal) 2+ (normal)  DP 2+ (normal) 2+ (normal)  PT Not palpable due to edema Not palpable due to edema   Extremities: without ischemic changes, without Gangrene , without cellulitis; without open wounds;  Musculoskeletal: no muscle wasting or atrophy  Neurologic: A&O X 3;  No focal weakness or paresthesias are detected Psychiatric:  The pt has Normal affect.   Non-Invasive Vascular Imaging:    +-------+-----------+-----------+------------+------------+  ABI/TBIToday's ABIToday's TBIPrevious ABIPrevious TBI  +-------+-----------+-----------+------------+------------+  Right  [place]         0.64       1.02        0.84          +-------+-----------+-----------+------------+------------+  Left   [place]         0.74       0.90        0.60          +-------+-----------+-----------+------------+------------+   ASSESSMENT/PLAN:: 80 y.o. male here for follow up for PAD. He has mild arterial disease. Minimal claudication symptoms and possibly not even claudication as hard to assess due to his ambulation being limited by his COPD. He has no  rest pain or tissue loss.  ABIs today are falsely elevated with non compressible vessels. However he does have triphasic/ biphasic waveforms suggesting adequate perfusion. There is slight decrease in right TBI, left is essentially unchanged.  - encouraged him to remain active and ambulate as much as he tolerates - elevate and compression stockings PRN - Continue Aspirin and Statin - He will follow up sooner if he has new or worsening symptoms - Recommend follow up in 1 year with repeat ABI   Karoline Caldwell, PA-C [facility] [phone]  Clinic MD:   [facility]

## 2021-10-02 ENCOUNTER — Other Ambulatory Visit: Payer: Self-pay

## 2021-10-02 ENCOUNTER — Encounter (INDEPENDENT_AMBULATORY_CARE_PROVIDER_SITE_OTHER): Payer: Medicare Other | Admitting: Ophthalmology

## 2021-10-02 DIAGNOSIS — H43813 Vitreous degeneration, bilateral: Secondary | ICD-10-CM | POA: Diagnosis not present

## 2021-10-02 DIAGNOSIS — H35033 Hypertensive retinopathy, bilateral: Secondary | ICD-10-CM

## 2021-10-02 DIAGNOSIS — I10 Essential (primary) hypertension: Secondary | ICD-10-CM | POA: Diagnosis not present

## 2021-10-02 DIAGNOSIS — H353231 Exudative age-related macular degeneration, bilateral, with active choroidal neovascularization: Secondary | ICD-10-CM | POA: Diagnosis not present

## 2021-11-13 ENCOUNTER — Encounter (INDEPENDENT_AMBULATORY_CARE_PROVIDER_SITE_OTHER): Payer: Medicare Other | Admitting: Ophthalmology

## 2021-11-13 ENCOUNTER — Other Ambulatory Visit: Payer: Self-pay

## 2021-11-13 DIAGNOSIS — I10 Essential (primary) hypertension: Secondary | ICD-10-CM

## 2021-11-13 DIAGNOSIS — H35033 Hypertensive retinopathy, bilateral: Secondary | ICD-10-CM

## 2021-11-13 DIAGNOSIS — H353231 Exudative age-related macular degeneration, bilateral, with active choroidal neovascularization: Secondary | ICD-10-CM | POA: Diagnosis not present

## 2021-11-13 DIAGNOSIS — H43813 Vitreous degeneration, bilateral: Secondary | ICD-10-CM | POA: Diagnosis not present

## 2021-12-25 ENCOUNTER — Other Ambulatory Visit: Payer: Self-pay

## 2021-12-25 ENCOUNTER — Encounter (INDEPENDENT_AMBULATORY_CARE_PROVIDER_SITE_OTHER): Payer: Medicare Other | Admitting: Ophthalmology

## 2021-12-25 DIAGNOSIS — I10 Essential (primary) hypertension: Secondary | ICD-10-CM | POA: Diagnosis not present

## 2021-12-25 DIAGNOSIS — H43813 Vitreous degeneration, bilateral: Secondary | ICD-10-CM

## 2021-12-25 DIAGNOSIS — H353231 Exudative age-related macular degeneration, bilateral, with active choroidal neovascularization: Secondary | ICD-10-CM | POA: Diagnosis not present

## 2021-12-25 DIAGNOSIS — H35033 Hypertensive retinopathy, bilateral: Secondary | ICD-10-CM | POA: Diagnosis not present

## 2022-01-22 ENCOUNTER — Ambulatory Visit (INDEPENDENT_AMBULATORY_CARE_PROVIDER_SITE_OTHER): Payer: Medicare Other | Admitting: Cardiology

## 2022-01-22 ENCOUNTER — Other Ambulatory Visit: Payer: Self-pay

## 2022-01-22 ENCOUNTER — Encounter: Payer: Self-pay | Admitting: Cardiology

## 2022-01-22 VITALS — BP 130/52 | HR 104 | Ht 67.0 in | Wt 207.0 lb

## 2022-01-22 DIAGNOSIS — I10 Essential (primary) hypertension: Secondary | ICD-10-CM | POA: Diagnosis not present

## 2022-01-22 DIAGNOSIS — I420 Dilated cardiomyopathy: Secondary | ICD-10-CM

## 2022-01-22 DIAGNOSIS — I2510 Atherosclerotic heart disease of native coronary artery without angina pectoris: Secondary | ICD-10-CM

## 2022-01-22 DIAGNOSIS — I5042 Chronic combined systolic (congestive) and diastolic (congestive) heart failure: Secondary | ICD-10-CM

## 2022-01-22 DIAGNOSIS — I255 Ischemic cardiomyopathy: Secondary | ICD-10-CM

## 2022-01-22 DIAGNOSIS — E785 Hyperlipidemia, unspecified: Secondary | ICD-10-CM

## 2022-01-22 MED ORDER — ATORVASTATIN CALCIUM 40 MG PO TABS: 40.0000 mg | ORAL_TABLET | Freq: Every day | ORAL | 3 refills | Status: DC

## 2022-01-22 NOTE — Progress Notes (Signed)
Cardiology Office Note:    Date:  01/22/2022   ID:  [name], DOB 09-04-1941, MRN [id number]  PCP:  Seward Carol, MD  Cardiologist:  Fransico Him, MD    Referring MD: Seward Carol, MD   Chief Complaint  Patient presents with   Coronary Artery Disease   Hypertension   Cardiomyopathy   Congestive Heart Failure   Hyperlipidemia    History of Present Illness:    [name] is a 81 y.o. male with a hx of CAD status post CABG x3 in 2002, ischemic cardiomyopathy last nuclear study 2013 EF 43%, hypertension, HLD, DM, chronic combined systolic/diastolic CHF,CKD, COPD.    2D echo 2019 was done showing EF 45 to 50% with probable hypokinesis of the mid apical lateral septal and apical myocardium consistent with ischemia in the distribution of the LAD, grade 1 DD.   He is here today for followup and is doing well.  He has chronic DOE that is very stable.  He has chronic intermittent LE edema which also has been stable.  He denies any chest pain or pressure, PND, orthopnea,  dizziness, palpitations or syncope.  He is compliant with his meds and is tolerating meds with no SE.     Past Medical History:  Diagnosis Date   Anxiety    CAD (coronary artery disease)     s/p CABG with LIMA to LAD, SVG to diag, SVG to OM, SVG to RCA cath 2002 Dr. Radford Pax   CAP (community acquired pneumonia) due to Pneumococcus ([place]) 03/03/2017   Cataract    Chronic combined systolic and diastolic CHF (congestive heart failure) ([place]) 04/28/2018   Chronic kidney disease    Claudication (HCC)    normal ABIs   COPD (chronic obstructive pulmonary disease) ([place])    Depression    Diabetes mellitus without complication ([place])    Dyslipidemia    Hypersomnia    Hypertension    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HCC)    EF 40% with apical AK at cath intolerant to ACE I and ARBS, EF 43% by nuclear stress test 08/2010   Kidney stones    Macular degeneration    Morbid obesity ([place]) 04/28/2018    Past Surgical History:   Procedure Laterality Date   CORONARY ARTERY BYPASS GRAFT     with LIMA to LAD, SVG to diag, SVG to OM, SVG to RCA cath 2002 Dr. Radford Pax   urologic procedure for fertility      Current Medications: Current Meds  Medication Sig   albuterol (PROVENTIL HFA;VENTOLIN HFA) 108 (90 Base) MCG/ACT inhaler Inhale 1-2 puffs into the lungs every 6 (six) hours as needed for wheezing or shortness of breath.   amLODipine (NORVASC) 10 MG tablet Take 10 mg by mouth daily.   aspirin EC 81 MG tablet Take 81 mg by mouth daily.   Besifloxacin HCl 0.6 % SUSP Apply 4 drops to eye. 4 drops in each eye twice a month. After eye injection.   dorzolamide (TRUSOPT) 2 % ophthalmic solution Place 1 drop into both eyes 2 (two) times daily.    escitalopram (LEXAPRO) 10 MG tablet Take 1 tablet by mouth daily.   furosemide (LASIX) 40 MG tablet Take 40 mg by mouth 3 (three) times a week.    metFORMIN (GLUCOPHAGE-XR) 500 MG 24 hr tablet Take 500 mg by mouth 2 (two) times daily.   metoprolol succinate (TOPROL-XL) 25 MG 24 hr tablet TAKE 1 TABLET BY MOUTH ONCE DAILY   Multiple Vitamin (MULTIVITAMIN) capsule  Take 1 capsule by mouth daily.   simvastatin (ZOCOR) 40 MG tablet Take 40 mg by mouth daily.    SYMBICORT 160-4.5 MCG/ACT inhaler Inhale 2 puffs into the lungs 2 (two) times daily.      Allergies:   Patient has no known allergies.   Social History   Socioeconomic History   Marital status: Married    Spouse name: Not on file   Number of children: Not on file   Years of education: Not on file   Highest education level: Not on file  Occupational History   Not on file  Tobacco Use   Smoking status: Former    Types: Cigarettes    Quit date: 10/25/2001    Years since quitting: 20.2   Smokeless tobacco: Never  Vaping Use   Vaping Use: Never used  Substance and Sexual Activity   Alcohol use: Yes    Comment: occasional beer and wine   Drug use: No   Sexual activity: Not on file  Other Topics Concern   Not on  file  Social History Narrative   Not on file   Social Determinants of Health   Financial Resource Strain: Not on file  Food Insecurity: Not on file  Transportation Needs: Not on file  Physical Activity: Not on file  Stress: Not on file  Social Connections: Not on file     Family History: The patient's family history includes Breast cancer in his father and sister; CAD in his sister; CVA in his father; Diabetes in his brother, mother, sister, and sister; Heart disease in his brother and sister; Hypertension in his father, mother, sister, and sister.  ROS:   Please see the history of present illness.    ROS  All other systems reviewed and negative.   EKGs/Labs/Other Studies Reviewed:    The following studies were reviewed today: Outside labs  EKG:  EKG is  ordered today.  The ekg ordered today demonstrates sinus tachycardia at 104bpm with isolated PVC, LAFB  Recent Labs: No results found for requested labs within last 8760 hours.   Recent Lipid Panel    Component Value Date/Time   CHOL 179 04/02/2016 1117   TRIG 60 04/02/2016 1117   HDL 79 04/02/2016 1117   CHOLHDL 2.3 04/02/2016 1117   VLDL 12 04/02/2016 1117   LDLCALC 88 04/02/2016 1117   LDLDIRECT 169.5 10/25/2013 1201    Physical Exam:    VS:  BP (!) 130/52    Pulse (!) 104    Ht 5\' 7"  (1.702 m)    Wt 207 lb (93.9 kg)    SpO2 91%    BMI 32.42 kg/m     Wt Readings from Last 3 Encounters:  01/22/22 207 lb (93.9 kg)  09/05/21 209 lb 4.8 oz (94.9 kg)  08/07/20 248 lb (112.5 kg)    GEN: Well nourished, well developed in no acute distress HEENT: Normal NECK: No JVD; No carotid bruits LYMPHATICS: No lymphadenopathy CARDIAC:RRR, no murmurs, rubs, gallops RESPIRATORY:  Clear to auscultation without rales, wheezing or rhonchi  ABDOMEN: Soft, non-tender, non-distended MUSCULOSKELETAL:  1+ BLE edema; No deformity  SKIN: Warm and dry NEUROLOGIC:  Alert and oriented x 3 PSYCHIATRIC:  Normal affect   ASSESSMENT:     1. Atherosclerosis of native coronary artery of native heart without angina pectoris   2. Chronic combined systolic and diastolic CHF (congestive heart failure) ([place])   3. 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[place])   4. Primary hypertension   5. Hyperlipidemia LDL  goal <70    PLAN:    In order of problems listed above:  1.  ASCAD -s/p remote CABG in 2002 -He has not had any anginal symptoms since I saw him last -He will continue prescription drug management with Toprol-XL 25 mg daily, aspirin 81 mg daily and statin  2.  Chronic combined systolic/diastolic CHF -He has chronic 1+ LE edema which is stable and chronic DOE is stable -He does not appear volume overloaded on exam today.   -continue prescription drug management with Toprol XL 25mg  daily and Lasix 40mg  daily with PRN refills -I have personally reviewed and interpreted outside labs performed by patient's PCP which showed serum creatinine 0.68, potassium 4.3 and TSH 1.63 on 01/15/2022  3. Ischemic DCM -EF 45-50% by echo 2019 -continue BB -repeat echo to reassess EF  4.  HTN -BP is well controlled on exam today -Continue prescription drug management with Toprol-XL 25 mg daily and amlodipine 10 mg daily with as needed refills  5.  HLD -LDL goal < 70 -I have personally reviewed and interpreted outside labs performed by patient's PCP which showed LDL 93, HDL 96, triglycerides 44, ALT 13 on 01/15/2022 -I am going to change him from simvastatin to atorvastatin 40 mg daily and repeat FLP and ALT in 6 weeks   Medication Adjustments/Labs and Tests Ordered: Current medicines are reviewed at length with the patient today.  Concerns regarding medicines are outlined above.  Orders Placed This Encounter  Procedures   EKG 12-Lead   No orders of the defined types were placed in this encounter.   Signed, Fransico Him, MD  01/22/2022 4:11 PM    [place]

## 2022-01-22 NOTE — Patient Instructions (Signed)
Medication Instructions:  Your physician has recommended you make the following change in your medication: 1) STOP Simvastatin 40mg  daily 2) START Atorvastatin 40mg  once daily  *If you need a refill on your cardiac medications before your next appointment, please call your pharmacy*   Lab Work: IN 6 WEEKS: fasting lipids and ALT If you have labs (blood work) drawn today and your tests are completely normal, you will receive your results only by: [place] (if you have MyChart) OR A paper copy in the mail If you have any lab test that is abnormal or we need to change your treatment, we will call you to review the results.   Testing/Procedures: Your physician has requested that you have an echocardiogram. Echocardiography is a painless test that uses sound waves to create images of your heart. It provides your doctor with information about the size and shape of your heart and how well your hearts chambers and valves are working. This procedure takes approximately one hour. There are no restrictions for this procedure.    Follow-Up: At [facility], you and your health needs are our priority.  As part of our continuing mission to provide you with exceptional heart care, we have created designated Provider Care Teams.  These Care Teams include your primary Cardiologist (physician) and Advanced Practice Providers (APPs -  Physician Assistants and Nurse Practitioners) who all work together to provide you with the care you need, when you need it.  Your next appointment:   1 year(s)  The format for your next appointment:   In Person  Provider:   Fransico Him, MD

## 2022-01-22 NOTE — Addendum Note (Signed)
Addended by: Molli Barrows on: 01/22/2022 04:24 PM   Modules accepted: Orders

## 2022-01-30 ENCOUNTER — Ambulatory Visit (HOSPITAL_COMMUNITY): Payer: Medicare Other | Attending: Internal Medicine

## 2022-01-30 ENCOUNTER — Other Ambulatory Visit: Payer: Self-pay

## 2022-01-30 DIAGNOSIS — I5042 Chronic combined systolic (congestive) and diastolic (congestive) heart failure: Secondary | ICD-10-CM | POA: Insufficient documentation

## 2022-01-30 DIAGNOSIS — I255 Ischemic cardiomyopathy: Secondary | ICD-10-CM | POA: Diagnosis present

## 2022-01-30 DIAGNOSIS — E785 Hyperlipidemia, unspecified: Secondary | ICD-10-CM | POA: Insufficient documentation

## 2022-01-30 DIAGNOSIS — I10 Essential (primary) hypertension: Secondary | ICD-10-CM | POA: Diagnosis present

## 2022-01-30 DIAGNOSIS — I420 Dilated cardiomyopathy: Secondary | ICD-10-CM | POA: Insufficient documentation

## 2022-01-30 DIAGNOSIS — I428 Other cardiomyopathies: Secondary | ICD-10-CM

## 2022-01-30 DIAGNOSIS — I2510 Atherosclerotic heart disease of native coronary artery without angina pectoris: Secondary | ICD-10-CM | POA: Insufficient documentation

## 2022-01-30 LAB — ECHOCARDIOGRAM COMPLETE
Area-P 1/2: 4.93 cm2
MV VTI: 2.18 cm2
S' Lateral: 3.95 cm

## 2022-02-05 ENCOUNTER — Other Ambulatory Visit: Payer: Self-pay

## 2022-02-05 ENCOUNTER — Encounter (INDEPENDENT_AMBULATORY_CARE_PROVIDER_SITE_OTHER): Payer: Medicare Other | Admitting: Ophthalmology

## 2022-02-05 DIAGNOSIS — I10 Essential (primary) hypertension: Secondary | ICD-10-CM

## 2022-02-05 DIAGNOSIS — H353231 Exudative age-related macular degeneration, bilateral, with active choroidal neovascularization: Secondary | ICD-10-CM

## 2022-02-05 DIAGNOSIS — H43813 Vitreous degeneration, bilateral: Secondary | ICD-10-CM

## 2022-02-05 DIAGNOSIS — H35033 Hypertensive retinopathy, bilateral: Secondary | ICD-10-CM | POA: Diagnosis not present

## 2022-02-06 ENCOUNTER — Telehealth: Payer: Self-pay

## 2022-02-06 DIAGNOSIS — Q2112 Patent foramen ovale: Secondary | ICD-10-CM

## 2022-02-06 DIAGNOSIS — I5042 Chronic combined systolic (congestive) and diastolic (congestive) heart failure: Secondary | ICD-10-CM

## 2022-02-06 DIAGNOSIS — I255 Ischemic cardiomyopathy: Secondary | ICD-10-CM

## 2022-02-06 DIAGNOSIS — I420 Dilated cardiomyopathy: Secondary | ICD-10-CM

## 2022-02-06 NOTE — Telephone Encounter (Signed)
The patient has been notified of the result and verbalized understanding.  All questions (if any) were answered. Antonieta Iba, RN 02/06/2022 11:56 AM  Limited echo has been ordered.

## 2022-02-06 NOTE — Telephone Encounter (Signed)
-----   Message from Sueanne Margarita, MD sent at 01/30/2022  1:20 PM EST ----- Echo showed low normal LVF with EF 50-55% with mildly leaky MV and possible small PFO - please repeat limited echo with agitated saline contrast to rule out PFO

## 2022-03-05 ENCOUNTER — Other Ambulatory Visit: Payer: Medicare Other | Admitting: *Deleted

## 2022-03-05 ENCOUNTER — Ambulatory Visit (HOSPITAL_COMMUNITY): Payer: Medicare Other | Attending: Cardiology

## 2022-03-05 ENCOUNTER — Other Ambulatory Visit: Payer: Self-pay

## 2022-03-05 DIAGNOSIS — I420 Dilated cardiomyopathy: Secondary | ICD-10-CM

## 2022-03-05 DIAGNOSIS — I5042 Chronic combined systolic (congestive) and diastolic (congestive) heart failure: Secondary | ICD-10-CM | POA: Insufficient documentation

## 2022-03-05 DIAGNOSIS — I255 Ischemic cardiomyopathy: Secondary | ICD-10-CM | POA: Diagnosis present

## 2022-03-05 DIAGNOSIS — Q2112 Patent foramen ovale: Secondary | ICD-10-CM | POA: Diagnosis not present

## 2022-03-05 DIAGNOSIS — I10 Essential (primary) hypertension: Secondary | ICD-10-CM

## 2022-03-05 DIAGNOSIS — E785 Hyperlipidemia, unspecified: Secondary | ICD-10-CM

## 2022-03-05 DIAGNOSIS — I2510 Atherosclerotic heart disease of native coronary artery without angina pectoris: Secondary | ICD-10-CM

## 2022-03-05 LAB — ALT: ALT: 19 IU/L (ref 0–44)

## 2022-03-05 LAB — LIPID PANEL
Chol/HDL Ratio: 2 ratio (ref 0.0–5.0)
Cholesterol, Total: 167 mg/dL (ref 100–199)
HDL: 85 mg/dL (ref >39)
LDL Chol Calc (NIH): 72 mg/dL (ref 0–99)
Triglycerides: 44 mg/dL (ref 0–149)
VLDL Cholesterol Cal: 10 mg/dL (ref 5–40)

## 2022-03-05 MED ORDER — SODIUM CHLORIDE 0.9% FLUSH
10.0000 mL | INTRAVENOUS | Status: AC | PRN
  Administered 2022-03-05: 20 mL via INTRAVENOUS

## 2022-03-19 ENCOUNTER — Encounter (INDEPENDENT_AMBULATORY_CARE_PROVIDER_SITE_OTHER): Payer: Medicare Other | Admitting: Ophthalmology

## 2022-03-19 DIAGNOSIS — I10 Essential (primary) hypertension: Secondary | ICD-10-CM

## 2022-03-19 DIAGNOSIS — H35033 Hypertensive retinopathy, bilateral: Secondary | ICD-10-CM

## 2022-03-19 DIAGNOSIS — H353231 Exudative age-related macular degeneration, bilateral, with active choroidal neovascularization: Secondary | ICD-10-CM

## 2022-03-19 DIAGNOSIS — H43813 Vitreous degeneration, bilateral: Secondary | ICD-10-CM | POA: Diagnosis not present

## 2022-04-25 ENCOUNTER — Emergency Department (HOSPITAL_BASED_OUTPATIENT_CLINIC_OR_DEPARTMENT_OTHER): Payer: Medicare Other

## 2022-04-25 ENCOUNTER — Other Ambulatory Visit: Payer: Self-pay

## 2022-04-25 ENCOUNTER — Inpatient Hospital Stay (HOSPITAL_BASED_OUTPATIENT_CLINIC_OR_DEPARTMENT_OTHER)
Admission: EM | Admit: 2022-04-25 | Discharge: 2022-05-09 | DRG: 871 | Disposition: A | Discharge status: Skilled Nursing Facility | Payer: Medicare Other | Attending: Family Medicine | Admitting: Family Medicine

## 2022-04-25 ENCOUNTER — Encounter (HOSPITAL_BASED_OUTPATIENT_CLINIC_OR_DEPARTMENT_OTHER): Payer: Self-pay

## 2022-04-25 DIAGNOSIS — N1411 Contrast-induced nephropathy: Secondary | ICD-10-CM | POA: Diagnosis not present

## 2022-04-25 DIAGNOSIS — Z91138 Patient's unintentional underdosing of medication regimen for other reason: Secondary | ICD-10-CM

## 2022-04-25 DIAGNOSIS — J9621 Acute and chronic respiratory failure with hypoxia: Secondary | ICD-10-CM | POA: Diagnosis present

## 2022-04-25 DIAGNOSIS — J3801 Paralysis of vocal cords and larynx, unilateral: Secondary | ICD-10-CM | POA: Diagnosis present

## 2022-04-25 DIAGNOSIS — I2510 Atherosclerotic heart disease of native coronary artery without angina pectoris: Secondary | ICD-10-CM | POA: Diagnosis present

## 2022-04-25 DIAGNOSIS — Z6837 Body mass index (BMI) 37.0-37.9, adult: Secondary | ICD-10-CM | POA: Diagnosis not present

## 2022-04-25 DIAGNOSIS — G9349 Other encephalopathy: Secondary | ICD-10-CM | POA: Diagnosis present

## 2022-04-25 DIAGNOSIS — I10 Essential (primary) hypertension: Secondary | ICD-10-CM

## 2022-04-25 DIAGNOSIS — R310 Gross hematuria: Secondary | ICD-10-CM | POA: Diagnosis not present

## 2022-04-25 DIAGNOSIS — G000 Hemophilus meningitis: Secondary | ICD-10-CM | POA: Diagnosis present

## 2022-04-25 DIAGNOSIS — G0490 Encephalitis and encephalomyelitis, unspecified: Secondary | ICD-10-CM | POA: Diagnosis not present

## 2022-04-25 DIAGNOSIS — I248 Other forms of acute ischemic heart disease: Secondary | ICD-10-CM | POA: Diagnosis not present

## 2022-04-25 DIAGNOSIS — I5042 Chronic combined systolic (congestive) and diastolic (congestive) heart failure: Secondary | ICD-10-CM

## 2022-04-25 DIAGNOSIS — R000 Tachycardia, unspecified: Secondary | ICD-10-CM | POA: Diagnosis not present

## 2022-04-25 DIAGNOSIS — G061 Intraspinal abscess and granuloma: Secondary | ICD-10-CM | POA: Diagnosis not present

## 2022-04-25 DIAGNOSIS — Z1152 Encounter for screening for COVID-19: Secondary | ICD-10-CM

## 2022-04-25 DIAGNOSIS — R251 Tremor, unspecified: Secondary | ICD-10-CM | POA: Diagnosis not present

## 2022-04-25 DIAGNOSIS — I110 Hypertensive heart disease with heart failure: Secondary | ICD-10-CM | POA: Diagnosis present

## 2022-04-25 DIAGNOSIS — R491 Aphonia: Secondary | ICD-10-CM | POA: Diagnosis present

## 2022-04-25 DIAGNOSIS — M47812 Spondylosis without myelopathy or radiculopathy, cervical region: Secondary | ICD-10-CM | POA: Diagnosis present

## 2022-04-25 DIAGNOSIS — E785 Hyperlipidemia, unspecified: Secondary | ICD-10-CM | POA: Diagnosis present

## 2022-04-25 DIAGNOSIS — G053 Encephalitis and encephalomyelitis in diseases classified elsewhere: Secondary | ICD-10-CM | POA: Diagnosis present

## 2022-04-25 DIAGNOSIS — E669 Obesity, unspecified: Secondary | ICD-10-CM | POA: Diagnosis present

## 2022-04-25 DIAGNOSIS — Z87442 Personal history of urinary calculi: Secondary | ICD-10-CM

## 2022-04-25 DIAGNOSIS — R7989 Other specified abnormal findings of blood chemistry: Secondary | ICD-10-CM | POA: Diagnosis not present

## 2022-04-25 DIAGNOSIS — I509 Heart failure, unspecified: Secondary | ICD-10-CM | POA: Diagnosis not present

## 2022-04-25 DIAGNOSIS — J449 Chronic obstructive pulmonary disease, unspecified: Secondary | ICD-10-CM | POA: Diagnosis present

## 2022-04-25 DIAGNOSIS — Z794 Long term (current) use of insulin: Secondary | ICD-10-CM

## 2022-04-25 DIAGNOSIS — R490 Dysphonia: Secondary | ICD-10-CM

## 2022-04-25 DIAGNOSIS — G253 Myoclonus: Secondary | ICD-10-CM | POA: Diagnosis not present

## 2022-04-25 DIAGNOSIS — E639 Nutritional deficiency, unspecified: Secondary | ICD-10-CM | POA: Diagnosis present

## 2022-04-25 DIAGNOSIS — R34 Anuria and oliguria: Secondary | ICD-10-CM | POA: Diagnosis not present

## 2022-04-25 DIAGNOSIS — E1165 Type 2 diabetes mellitus with hyperglycemia: Secondary | ICD-10-CM | POA: Diagnosis present

## 2022-04-25 DIAGNOSIS — I420 Dilated cardiomyopathy: Secondary | ICD-10-CM | POA: Diagnosis present

## 2022-04-25 DIAGNOSIS — R221 Localized swelling, mass and lump, neck: Secondary | ICD-10-CM | POA: Diagnosis present

## 2022-04-25 DIAGNOSIS — Z7951 Long term (current) use of inhaled steroids: Secondary | ICD-10-CM

## 2022-04-25 DIAGNOSIS — Z66 Do not resuscitate: Secondary | ICD-10-CM | POA: Diagnosis not present

## 2022-04-25 DIAGNOSIS — Z833 Family history of diabetes mellitus: Secondary | ICD-10-CM

## 2022-04-25 DIAGNOSIS — J441 Chronic obstructive pulmonary disease with (acute) exacerbation: Secondary | ICD-10-CM | POA: Diagnosis not present

## 2022-04-25 DIAGNOSIS — G8929 Other chronic pain: Secondary | ICD-10-CM | POA: Diagnosis present

## 2022-04-25 DIAGNOSIS — J439 Emphysema, unspecified: Secondary | ICD-10-CM | POA: Diagnosis present

## 2022-04-25 DIAGNOSIS — E870 Hyperosmolality and hypernatremia: Secondary | ICD-10-CM | POA: Diagnosis not present

## 2022-04-25 DIAGNOSIS — Z8249 Family history of ischemic heart disease and other diseases of the circulatory system: Secondary | ICD-10-CM

## 2022-04-25 DIAGNOSIS — R29700 NIHSS score 0: Secondary | ICD-10-CM | POA: Diagnosis present

## 2022-04-25 DIAGNOSIS — Z87891 Personal history of nicotine dependence: Secondary | ICD-10-CM | POA: Diagnosis not present

## 2022-04-25 DIAGNOSIS — J9601 Acute respiratory failure with hypoxia: Secondary | ICD-10-CM | POA: Diagnosis not present

## 2022-04-25 DIAGNOSIS — A419 Sepsis, unspecified organism: Secondary | ICD-10-CM

## 2022-04-25 DIAGNOSIS — F32A Depression, unspecified: Secondary | ICD-10-CM | POA: Diagnosis present

## 2022-04-25 DIAGNOSIS — E876 Hypokalemia: Secondary | ICD-10-CM | POA: Diagnosis not present

## 2022-04-25 DIAGNOSIS — T501X6A Underdosing of loop [high-ceiling] diuretics, initial encounter: Secondary | ICD-10-CM | POA: Diagnosis present

## 2022-04-25 DIAGNOSIS — R338 Other retention of urine: Secondary | ICD-10-CM | POA: Diagnosis not present

## 2022-04-25 DIAGNOSIS — E860 Dehydration: Secondary | ICD-10-CM | POA: Diagnosis not present

## 2022-04-25 DIAGNOSIS — H3530 Unspecified macular degeneration: Secondary | ICD-10-CM | POA: Diagnosis present

## 2022-04-25 DIAGNOSIS — D179 Benign lipomatous neoplasm, unspecified: Secondary | ICD-10-CM | POA: Diagnosis present

## 2022-04-25 DIAGNOSIS — G9341 Metabolic encephalopathy: Secondary | ICD-10-CM | POA: Diagnosis not present

## 2022-04-25 DIAGNOSIS — R270 Ataxia, unspecified: Secondary | ICD-10-CM | POA: Diagnosis not present

## 2022-04-25 DIAGNOSIS — R0602 Shortness of breath: Secondary | ICD-10-CM | POA: Diagnosis present

## 2022-04-25 DIAGNOSIS — R6520 Severe sepsis without septic shock: Secondary | ICD-10-CM

## 2022-04-25 DIAGNOSIS — B9689 Other specified bacterial agents as the cause of diseases classified elsewhere: Secondary | ICD-10-CM | POA: Diagnosis not present

## 2022-04-25 DIAGNOSIS — J690 Pneumonitis due to inhalation of food and vomit: Secondary | ICD-10-CM | POA: Diagnosis present

## 2022-04-25 DIAGNOSIS — F419 Anxiety disorder, unspecified: Secondary | ICD-10-CM | POA: Diagnosis present

## 2022-04-25 DIAGNOSIS — E119 Type 2 diabetes mellitus without complications: Secondary | ICD-10-CM | POA: Diagnosis not present

## 2022-04-25 DIAGNOSIS — M7989 Other specified soft tissue disorders: Secondary | ICD-10-CM | POA: Diagnosis present

## 2022-04-25 DIAGNOSIS — I255 Ischemic cardiomyopathy: Secondary | ICD-10-CM | POA: Diagnosis present

## 2022-04-25 DIAGNOSIS — Z823 Family history of stroke: Secondary | ICD-10-CM

## 2022-04-25 DIAGNOSIS — I639 Cerebral infarction, unspecified: Secondary | ICD-10-CM | POA: Diagnosis present

## 2022-04-25 DIAGNOSIS — E278 Other specified disorders of adrenal gland: Secondary | ICD-10-CM | POA: Diagnosis not present

## 2022-04-25 DIAGNOSIS — Z7982 Long term (current) use of aspirin: Secondary | ICD-10-CM

## 2022-04-25 DIAGNOSIS — Z951 Presence of aortocoronary bypass graft: Secondary | ICD-10-CM

## 2022-04-25 DIAGNOSIS — M62830 Muscle spasm of back: Secondary | ICD-10-CM | POA: Diagnosis present

## 2022-04-25 DIAGNOSIS — E279 Disorder of adrenal gland, unspecified: Secondary | ICD-10-CM | POA: Diagnosis present

## 2022-04-25 DIAGNOSIS — I5023 Acute on chronic systolic (congestive) heart failure: Secondary | ICD-10-CM | POA: Diagnosis present

## 2022-04-25 DIAGNOSIS — Z9981 Dependence on supplemental oxygen: Secondary | ICD-10-CM

## 2022-04-25 DIAGNOSIS — E861 Hypovolemia: Secondary | ICD-10-CM | POA: Diagnosis not present

## 2022-04-25 DIAGNOSIS — N401 Enlarged prostate with lower urinary tract symptoms: Secondary | ICD-10-CM | POA: Diagnosis present

## 2022-04-25 DIAGNOSIS — R1909 Other intra-abdominal and pelvic swelling, mass and lump: Secondary | ICD-10-CM | POA: Diagnosis not present

## 2022-04-25 DIAGNOSIS — Z79899 Other long term (current) drug therapy: Secondary | ICD-10-CM

## 2022-04-25 DIAGNOSIS — E1151 Type 2 diabetes mellitus with diabetic peripheral angiopathy without gangrene: Secondary | ICD-10-CM | POA: Diagnosis present

## 2022-04-25 DIAGNOSIS — Z7984 Long term (current) use of oral hypoglycemic drugs: Secondary | ICD-10-CM

## 2022-04-25 DIAGNOSIS — T508X5A Adverse effect of diagnostic agents, initial encounter: Secondary | ICD-10-CM | POA: Diagnosis not present

## 2022-04-25 DIAGNOSIS — T501X5A Adverse effect of loop [high-ceiling] diuretics, initial encounter: Secondary | ICD-10-CM | POA: Diagnosis not present

## 2022-04-25 DIAGNOSIS — G039 Meningitis, unspecified: Secondary | ICD-10-CM

## 2022-04-25 DIAGNOSIS — N179 Acute kidney failure, unspecified: Secondary | ICD-10-CM | POA: Diagnosis not present

## 2022-04-25 DIAGNOSIS — A413 Sepsis due to Hemophilus influenzae: Principal | ICD-10-CM | POA: Diagnosis present

## 2022-04-25 DIAGNOSIS — Z803 Family history of malignant neoplasm of breast: Secondary | ICD-10-CM

## 2022-04-25 LAB — URINALYSIS, ROUTINE W REFLEX MICROSCOPIC
Glucose, UA: 100 mg/dL — AB
Ketones, ur: 40 mg/dL — AB
Leukocytes,Ua: NEGATIVE
Nitrite: NEGATIVE
Protein, ur: 100 mg/dL — AB
Specific Gravity, Urine: 1.02 (ref 1.005–1.030)
pH: 5 (ref 5.0–8.0)

## 2022-04-25 LAB — I-STAT ARTERIAL BLOOD GAS, ED
Acid-Base Excess: 4 mmol/L — ABNORMAL HIGH (ref 0.0–2.0)
Bicarbonate: 30.3 mmol/L — ABNORMAL HIGH (ref 20.0–28.0)
Calcium, Ion: 1.14 mmol/L — ABNORMAL LOW (ref 1.15–1.40)
HCT: 41 % (ref 39.0–52.0)
Hemoglobin: 13.9 g/dL (ref 13.0–17.0)
O2 Saturation: 95 %
Potassium: 3.1 mmol/L — ABNORMAL LOW (ref 3.5–5.1)
Sodium: 133 mmol/L — ABNORMAL LOW (ref 135–145)
TCO2: 32 mmol/L (ref 22–32)
pCO2 arterial: 49.2 mmHg — ABNORMAL HIGH (ref 32–48)
pH, Arterial: 7.397 (ref 7.35–7.45)
pO2, Arterial: 75 mmHg — ABNORMAL LOW (ref 83–108)

## 2022-04-25 LAB — CBC
HCT: 42.3 % (ref 39.0–52.0)
Hemoglobin: 14 g/dL (ref 13.0–17.0)
MCH: 29.6 pg (ref 26.0–34.0)
MCHC: 33.1 g/dL (ref 30.0–36.0)
MCV: 89.4 fL (ref 80.0–100.0)
Platelets: 282 10*3/uL (ref 150–400)
RBC: 4.73 MIL/uL (ref 4.22–5.81)
RDW: 13.7 % (ref 11.5–15.5)
WBC: 16 10*3/uL — ABNORMAL HIGH (ref 4.0–10.5)
nRBC: 0 % (ref 0.0–0.2)

## 2022-04-25 LAB — URINALYSIS, MICROSCOPIC (REFLEX)

## 2022-04-25 LAB — BASIC METABOLIC PANEL
Anion gap: 12 (ref 5–15)
BUN: 20 mg/dL (ref 8–23)
CO2: 26 mmol/L (ref 22–32)
Calcium: 9.2 mg/dL (ref 8.9–10.3)
Chloride: 96 mmol/L — ABNORMAL LOW (ref 98–111)
Creatinine, Ser: 1.02 mg/dL (ref 0.61–1.24)
GFR, Estimated: >60 mL/min (ref >60)
Glucose, Bld: 196 mg/dL — ABNORMAL HIGH (ref 70–99)
Potassium: 3.5 mmol/L (ref 3.5–5.1)
Sodium: 134 mmol/L — ABNORMAL LOW (ref 135–145)

## 2022-04-25 LAB — CBG MONITORING, ED: Glucose-Capillary: 255 mg/dL — ABNORMAL HIGH (ref 70–99)

## 2022-04-25 LAB — LACTIC ACID, PLASMA: Lactic Acid, Venous: 1.6 mmol/L (ref 0.5–1.9)

## 2022-04-25 LAB — RESP PANEL BY RT-PCR (FLU A&B, COVID) ARPGX2
Influenza A by PCR: NEGATIVE
Influenza B by PCR: NEGATIVE
SARS Coronavirus 2 by RT PCR: NEGATIVE

## 2022-04-25 LAB — TROPONIN I (HIGH SENSITIVITY)
Troponin I (High Sensitivity): 18 ng/L — ABNORMAL HIGH (ref <18)
Troponin I (High Sensitivity): 15 ng/L (ref <18)

## 2022-04-25 LAB — BRAIN NATRIURETIC PEPTIDE: B Natriuretic Peptide: 540.9 pg/mL — ABNORMAL HIGH (ref 0.0–100.0)

## 2022-04-25 MED ORDER — MOMETASONE FURO-FORMOTEROL FUM 200-5 MCG/ACT IN AERO
2.0000 | INHALATION_SPRAY | Freq: Two times a day (BID) | RESPIRATORY_TRACT | Status: DC
  Administered 2022-04-26 – 2022-05-09 (×24): 2 via RESPIRATORY_TRACT
  Filled 2022-04-25: qty 8.8

## 2022-04-25 MED ORDER — SODIUM CHLORIDE 0.9 % IV SOLN
12.5000 mg | Freq: Once | INTRAVENOUS | Status: AC
  Administered 2022-04-25: 12.5 mg via INTRAVENOUS
  Filled 2022-04-25: qty 0.5

## 2022-04-25 MED ORDER — IOHEXOL 300 MG/ML  SOLN
100.0000 mL | Freq: Once | INTRAMUSCULAR | Status: AC | PRN
  Administered 2022-04-25: 100 mL via INTRAVENOUS

## 2022-04-25 MED ORDER — POTASSIUM CHLORIDE 10 MEQ/100ML IV SOLN
10.0000 meq | INTRAVENOUS | Status: AC
  Administered 2022-04-26 (×4): 10 meq via INTRAVENOUS
  Filled 2022-04-25: qty 100

## 2022-04-25 MED ORDER — SODIUM CHLORIDE 0.9 % IV SOLN
500.0000 mg | Freq: Once | INTRAVENOUS | Status: AC
  Administered 2022-04-25: 500 mg via INTRAVENOUS
  Filled 2022-04-25: qty 5

## 2022-04-25 MED ORDER — METOPROLOL SUCCINATE ER 25 MG PO TB24
25.0000 mg | ORAL_TABLET | Freq: Every day | ORAL | Status: DC
  Administered 2022-04-26: 25 mg via ORAL
  Filled 2022-04-25: qty 1

## 2022-04-25 MED ORDER — ENOXAPARIN SODIUM 40 MG/0.4ML IJ SOSY: 40.0000 mg | PREFILLED_SYRINGE | Freq: Every day | INTRAMUSCULAR | Status: DC

## 2022-04-25 MED ORDER — MORPHINE SULFATE (PF) 2 MG/ML IV SOLN
2.0000 mg | Freq: Once | INTRAVENOUS | Status: AC
  Administered 2022-04-25: 2 mg via INTRAVENOUS
  Filled 2022-04-25: qty 1

## 2022-04-25 MED ORDER — SODIUM CHLORIDE 0.9% FLUSH
3.0000 mL | Freq: Two times a day (BID) | INTRAVENOUS | Status: DC
  Administered 2022-04-26 – 2022-05-08 (×23): 3 mL via INTRAVENOUS

## 2022-04-25 MED ORDER — ACETAMINOPHEN 325 MG PO TABS
650.0000 mg | ORAL_TABLET | ORAL | Status: DC | PRN
  Filled 2022-04-25: qty 2

## 2022-04-25 MED ORDER — INSULIN ASPART 100 UNIT/ML IJ SOLN
0.0000 [IU] | INTRAMUSCULAR | Status: DC
  Administered 2022-04-26: 2 [IU] via SUBCUTANEOUS
  Administered 2022-04-26: 3 [IU] via SUBCUTANEOUS
  Administered 2022-04-26 – 2022-04-27 (×4): 2 [IU] via SUBCUTANEOUS
  Administered 2022-04-27: 3 [IU] via SUBCUTANEOUS
  Administered 2022-04-27 (×2): 2 [IU] via SUBCUTANEOUS

## 2022-04-25 MED ORDER — SODIUM CHLORIDE 0.9 % IV SOLN
250.0000 mL | INTRAVENOUS | Status: DC | PRN
Start: 2022-04-25 — End: 2022-05-10
  Administered 2022-04-26: 500 mL via INTRAVENOUS
  Administered 2022-05-03 – 2022-05-05 (×2): 250 mL via INTRAVENOUS

## 2022-04-25 MED ORDER — MORPHINE SULFATE (PF) 2 MG/ML IV SOLN
2.0000 mg | INTRAVENOUS | Status: DC | PRN
  Administered 2022-04-25 – 2022-04-27 (×8): 2 mg via INTRAVENOUS
  Administered 2022-04-27: 4 mg via INTRAVENOUS
  Administered 2022-04-28 – 2022-04-29 (×4): 2 mg via INTRAVENOUS
  Filled 2022-04-25: qty 2
  Filled 2022-04-25 (×12): qty 1

## 2022-04-25 MED ORDER — UMECLIDINIUM BROMIDE 62.5 MCG/ACT IN AEPB
1.0000 | INHALATION_SPRAY | Freq: Every day | RESPIRATORY_TRACT | Status: DC
  Administered 2022-04-26 – 2022-05-09 (×11): 1 via RESPIRATORY_TRACT
  Filled 2022-04-25 (×2): qty 7

## 2022-04-25 MED ORDER — FUROSEMIDE 10 MG/ML IJ SOLN
40.0000 mg | Freq: Once | INTRAMUSCULAR | Status: AC
  Administered 2022-04-25: 40 mg via INTRAVENOUS
  Filled 2022-04-25: qty 4

## 2022-04-25 MED ORDER — SODIUM CHLORIDE 0.9% FLUSH
3.0000 mL | INTRAVENOUS | Status: DC | PRN
  Administered 2022-05-09: 3 mL via INTRAVENOUS

## 2022-04-25 MED ORDER — SODIUM CHLORIDE 0.9 % IV SOLN
2.0000 g | Freq: Once | INTRAVENOUS | Status: AC
  Administered 2022-04-25: 2 g via INTRAVENOUS
  Filled 2022-04-25: qty 20

## 2022-04-25 MED ORDER — IOHEXOL 300 MG/ML  SOLN
75.0000 mL | Freq: Once | INTRAMUSCULAR | Status: AC | PRN
  Administered 2022-04-25: 75 mL via INTRAVENOUS

## 2022-04-25 MED ORDER — LEVALBUTEROL HCL 0.63 MG/3ML IN NEBU: 0.6300 mg | INHALATION_SOLUTION | Freq: Four times a day (QID) | RESPIRATORY_TRACT | Status: DC | PRN

## 2022-04-25 MED ORDER — PROMETHAZINE HCL 25 MG/ML IJ SOLN
INTRAMUSCULAR | Status: AC
  Filled 2022-04-25: qty 1

## 2022-04-25 MED ORDER — ONDANSETRON HCL 4 MG/2ML IJ SOLN
4.0000 mg | Freq: Once | INTRAMUSCULAR | Status: AC
  Administered 2022-04-25: 4 mg via INTRAVENOUS
  Filled 2022-04-25: qty 2

## 2022-04-25 MED ORDER — ONDANSETRON HCL 4 MG/2ML IJ SOLN: 4.0000 mg | Freq: Four times a day (QID) | INTRAMUSCULAR | Status: DC | PRN

## 2022-04-25 NOTE — Assessment & Plan Note (Addendum)
Likely COPD exacerbation secondary to CHF exacerbation. ?1. COPD pathway ?2. On BIPAP for the moment ?3. Scheduled LABA, LAMA, INH steroid. ?4. PRN SABA ?5. Trying to avoid steroids due to CHF exacerbation. ?

## 2022-04-25 NOTE — H&P (Signed)
?History and Physical  ? ? ?Patient: [name] BWG:[id number] DOB: 1941-04-20 ?DOA: 04/25/2022 ?DOS: the patient was seen and examined on 04/25/2022 ?PCP: Seward Carol, MD  ?Patient coming from: Home ? ?Chief Complaint:  ?Chief Complaint  ?Patient presents with  ? Chest Pain  ? Cough  ? ?HPI: [name] is a 81 y.o. male with medical history significant of COPD on chronic O2, HFrEF (40%), unable to tolerate ACEi and ARBs, DM2, CAD s/p CABG. ? ?He has history of heart failure and normally takes a fluid pill but did not take it almost 2 days.  His wife states that he takes it intermittently when he is at home but does not usually miss several days.  Has been taking it since returning. ? ?Reports BLE edema but not really any worse than baseline. ? ?Reports orthopnea worse than baseline. ? ?Having intermittent confusion over past few days per wife. ? ?Also new onset BUE tremor per wife. ? ?Severe DOE. ? ?Some nausea, improved in ED after phenergan. ? ?Review of Systems: As mentioned in the history of present illness. All other systems reviewed and are negative. ?Past Medical History:  ?Diagnosis Date  ? Anxiety   ? CAD (coronary artery disease)   ?  s/p CABG with LIMA to LAD, SVG to diag, SVG to OM, SVG to RCA cath 2002 Dr. Radford Pax  ? CAP (community acquired pneumonia) due to Pneumococcus ([place]) 03/03/2017  ? Cataract   ? Chronic combined systolic and diastolic CHF (congestive heart failure) ([place]) 04/28/2018  ? Chronic kidney disease   ? [facility])   ? normal ABIs  ? COPD (chronic obstructive pulmonary disease) ([place])   ? Depression   ? Diabetes mellitus without complication ([place])   ? Dyslipidemia   ? Hypersomnia   ? Hypertension   ? 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[place])   ? EF 40% with apical AK at cath intolerant to ACE I and ARBS, EF 43% by nuclear stress test 08/2010  ? Kidney stones   ? Macular degeneration   ? Morbid obesity ([place]) 04/28/2018  ? ?Past Surgical History:  ?Procedure Laterality Date  ?  CORONARY ARTERY BYPASS GRAFT    ? with LIMA to LAD, SVG to diag, SVG to OM, SVG to RCA cath 2002 Dr. Radford Pax  ? urologic procedure for fertility    ? ?Social History:  reports that he quit smoking about 20 years ago. His smoking use included cigarettes. He has never used smokeless tobacco. He reports current alcohol use. He reports that he does not use drugs. ? ?No Known Allergies ? ?Family History  ?Problem Relation Age of Onset  ? Hypertension Father   ? Breast cancer Father   ? CVA Father   ? Diabetes Mother   ? Hypertension Mother   ? CAD Sister   ? Hypertension Sister   ? Diabetes Sister   ? Heart disease Brother   ? Diabetes Brother   ? Breast cancer Sister   ? Hypertension Sister   ? Diabetes Sister   ? Heart disease Sister   ? ? ?Prior to Admission medications   ?Medication Sig Start Date End Date Taking? Authorizing Provider  ?albuterol (PROVENTIL HFA;VENTOLIN HFA) 108 (90 Base) MCG/ACT inhaler Inhale 1-2 puffs into the lungs every 6 (six) hours as needed for wheezing or shortness of breath. 03/05/17   Reyne Dumas, MD  ?amLODipine (NORVASC) 10 MG tablet Take 10 mg by mouth daily.    [provider]  ?aspirin EC 81  MG tablet Take 81 mg by mouth daily.    [provider]  ?atorvastatin (LIPITOR) 40 MG tablet Take 1 tablet (40 mg total) by mouth daily. 01/22/22   Sueanne Margarita, MD  ?Besifloxacin HCl 0.6 % SUSP Apply 4 drops to eye. 4 drops in each eye twice a month. After eye injection.    [provider]  ?dorzolamide (TRUSOPT) 2 % ophthalmic solution Place 1 drop into both eyes 2 (two) times daily.  01/24/20   [provider]  ?escitalopram (LEXAPRO) 10 MG tablet Take 1 tablet by mouth daily. 09/06/13   [provider]  ?furosemide (LASIX) 40 MG tablet Take 40 mg by mouth 3 (three) times a week.  03/11/18   [provider]  ?metFORMIN (GLUCOPHAGE-XR) 500 MG 24 hr tablet Take 500 mg by mouth 2 (two) times daily. 06/24/20   [provider]  ?metoprolol  succinate (TOPROL-XL) 25 MG 24 hr tablet TAKE 1 TABLET BY MOUTH ONCE DAILY 05/05/17   Nahser, Wonda Cheng, MD  ?Multiple Vitamin (MULTIVITAMIN) capsule Take 1 capsule by mouth daily.    [provider]  ?SYMBICORT 160-4.5 MCG/ACT inhaler Inhale 2 puffs into the lungs 2 (two) times daily.  01/28/16   [provider]  ? ? ?Physical Exam: ?Vitals:  ? 04/25/22 1945 04/25/22 2000 04/25/22 2121 04/25/22 2137  ?BP: (!) 142/73 140/78  133/87  ?Pulse: (!) 117 (!) 117 81 (!) 117  ?Resp: (!) 23 (!) _0 ?Temp: 98.4 ?F (36.9 ?C)   99.2 ?F (37.3 ?C)  ?TempSrc: Oral   Axillary  ?SpO2: 100% 100% 97% 100%  ?Weight:    104.4 kg  ?Height:      ? ?Constitutional: On BIPAP ?Eyes: PERRL, lids and conjunctivae normal ?ENMT: Mucous membranes are moist. Posterior pharynx clear of any exudate or lesions.Normal dentition.  ?Neck: normal, supple, no masses, no thyromegaly ?Respiratory: On BIPAP ?Cardiovascular: Tachycardic, Trace BLE edema ?Abdomen: no tenderness, no masses palpated. No hepatosplenomegaly. Bowel sounds positive.  ?Musculoskeletal: no clubbing / cyanosis. No joint deformity upper and lower extremities. Good ROM, no contractures. Normal muscle tone.  ?Skin: no rashes, lesions, ulcers. No induration ?Neurologic: MAE, tremors of BUE ?Psychiatric: Intermittent confusion. ? ? ?Data Reviewed: ? ?  ?CT CAP: ?IMPRESSION: ?Intermediate attenuation mass presacral space 7.0 x 4.6 x 6.1 cm, ?nonspecific, uncertain etiology. ?  ?Differential diagnosis would include complicated rectal duplication ?cyst, metastatic lesion, teratoma, lymphoma; in light of coexistent ?RIGHT adrenal mass, consider PET-CT for further evaluation to assess ?for potential primary tumor and to guide for most accessible site ?for biopsy. ?  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 ?Surgical consultation is recommended, though see above ?recommendation first. Consider biochemical lab evaluation for ?functional status and  pheochromocytoma prior to resection. ? ? ?IMPRESSION: ?1. 2 mm hyperdense focus in the right parietal periventricular white ?matter, indeterminate for a tiny metastasis, focus of calcification, ?punctate hemorrhage, or vascular structure with assessment limited ?by residual contrast from today's earlier body CT. Head MRI without ?and with contrast is recommended for further evaluation. ?2. 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 ? ? ?Assessment and Plan: ?* Acute on chronic respiratory failure with hypoxia (HCC) ?86% on 2L ?COPD exacerbation secondary to CHF exacerbation? ?PE also in the DDx. ?Rescue BIPAP for the moment. ?Wean as tolerated. ?Check D.Dimer: pt with recent travel history + leg swelling. ?CT chest did get some contrast opacification of pulmonary artery, enough, I think, to say: 'no  large central PE', but not enough to say anything about periphery. ?Not sure why EDP didn't consider PE given recent travel history. ?Given all contrast given in ED thus far today, may want to do VQ scan in AM if D.Dimer positive. ?Cant anti coag yet until MRI excludes hemorrhage ? ?Acute on chronic systolic (congestive) heart failure ([place]) ?Question exacerbation due to missed lasix doses recently? ?CHF pathway ?Lasix 38m IV daily for the moment ?BMP daily ?Strict intake and output. ?Tele monitor ?Cont home BB (especially with mild S.Tach on monitor) ?Consider IV metoprolol after PE ruled out. ?Remainder of home med-rec pending. ? ?COPD with acute exacerbation ([place] ?Likely COPD exacerbation secondary to CHF exacerbation. ?COPD pathway ?On BIPAP for the moment ?Scheduled LABA, LAMA, INH steroid. ?PRN SABA ?Trying to avoid steroids due to CHF exacerbation. ? ?Presacral mass ?Worrisome for malignancy with adrenal mass / met, and ? Brain met. ?Also a neck mass too (not yet called on imaging). ?MRI brain with and without ?IR eval for biopsy(s) ?Will order PET scan. ?NPO after MN ? ?[place]al mass [place] ?See  above ??Pheo with S.Tach, CHF exacerbation, tremor, and imaging findings that have radiologist questioning this? ? Pheo is pretty rare though... ?Urine and plasma metanephrines ?Renin and aldosterone ?Cortisol ?

## 2022-04-25 NOTE — ED Provider Notes (Signed)
?[facility] EMERGENCY DEPARTMENT ?Provider Note ? ? ?CSN: [id number] ?Arrival date & time: 04/25/22  1035 ? ?  ? ?History ?Past medical history significant for CABG in [name], ?CHF, Sean [name], Sean [name], HTN. ?Chief Complaint  ?Patient presents with  ? Chest Pain  ? Cough  ? ? ?Sean [name] is a 81 [name]. ? ?Patient presents with several day history of worsening shortness of breath as well as "feeling bad".  Reports that earlier this week he drove to [facility] for work and back.  He has history of heart failure and normally takes a fluid pill but did not take it almost 2 days.  His wife states that he takes it intermittently when he is at home but does not usually miss several days.  Has been taking it since returning.  Reports worsening lower extremity edema as well as orthopnea.  His wife also reports that he has been complaining of having the urge to cough but being unable to do so.  She denies any known fevers but reports he woke up this morning and was diaphoretic so she gave him some Tylenol around 845 this morning.  He has also been experiencing nausea.  No known sick contacts.  Patient's wife also reports that over the last few days he has been more confused intermittently, for example, he asked where they were going this morning on his way to the hospital. ? ? ?  ? ?Home Medications ?Prior to Admission medications   ?Medication Sig Start Date End Date Taking? Authorizing Provider  ?albuterol (PROVENTIL HFA;VENTOLIN HFA) 108 (90 Base) MCG/ACT inhaler Inhale 1-2 puffs into the lungs every 6 (six) hours as needed for wheezing or shortness of breath. 03/05/17   Reyne Dumas, MD  ?amLODipine (NORVASC) 10 MG tablet Take 10 mg by mouth daily.    [provider]  ?aspirin EC 81 MG tablet Take 81 mg by mouth daily.    [provider]  ?atorvastatin (LIPITOR) 40 MG tablet Take 1 tablet (40 mg total) by mouth daily. 01/22/22   Sueanne Margarita, MD  ?Besifloxacin HCl 0.6 % SUSP Apply 4 drops to eye. 4  drops in each eye twice a month. After eye injection.    [provider]  ?dorzolamide (TRUSOPT) 2 % ophthalmic solution Place 1 drop into both eyes 2 (two) times daily.  01/24/20   [provider]  ?escitalopram (LEXAPRO) 10 MG tablet Take 1 tablet by mouth daily. 09/06/13   [provider]  ?furosemide (LASIX) 40 MG tablet Take 40 mg by mouth 3 (three) times a week.  03/11/18   [provider]  ?metFORMIN (GLUCOPHAGE-XR) 500 MG 24 hr tablet Take 500 mg by mouth 2 (two) times daily. 06/24/20   [provider]  ?metoprolol succinate (TOPROL-XL) 25 MG 24 hr tablet TAKE 1 TABLET BY MOUTH ONCE DAILY 05/05/17   Nahser, Wonda Cheng, MD  ?Multiple Vitamin (MULTIVITAMIN) capsule Take 1 capsule by mouth daily.    [provider]  ?SYMBICORT 160-4.5 MCG/ACT inhaler Inhale 2 puffs into the lungs 2 (two) times daily.  01/28/16   [provider]  ?   ? ?Allergies    ?Patient has no known allergies.   ? ?Review of Systems   ?Review of Systems  ?Constitutional:  Positive for diaphoresis. Negative for chills and fever.  ?HENT:  Negative for congestion.   ? ?Physical Exam ?Updated Vital Signs ?BP (!) 144/81   Pulse (!) 124   Temp 98.3 ?F (36.8 ?  C) (Oral)   Resp (!) 24   Ht '5\' 6"'$  (1.676 m)   Wt 90.7 kg   SpO2 92%   BMI 32.28 kg/m?  ?Physical Exam ? ?ED Results / Procedures / Treatments   ?Labs ?(all labs ordered are listed, but only abnormal results are displayed) ?Labs Reviewed  ?BASIC METABOLIC PANEL - Abnormal; Notable for the following components:  ?    Result Value  ? Sodium 134 (*)   ? Chloride 96 (*)   ? Glucose, Bld 196 (*)   ? All other components within normal limits  ?CBC - Abnormal; Notable for the following components:  ? WBC 16.0 (*)   ? All other components within normal limits  ?BRAIN NATRIURETIC PEPTIDE - Abnormal; Notable for the following components:  ? B Natriuretic Peptide 540.9 (*)   ? All other components within normal limits  ?URINALYSIS, ROUTINE W  REFLEX MICROSCOPIC - Abnormal; Notable for the following components:  ? Glucose, UA 100 (*)   ? Hgb urine dipstick TRACE (*)   ? Bilirubin Urine SMALL (*)   ? Ketones, ur 40 (*)   ? Protein, ur 100 (*)   ? All other components within normal limits  ?URINALYSIS, MICROSCOPIC (REFLEX) - Abnormal; Notable for the following components:  ? Bacteria, UA RARE (*)   ? All other components within normal limits  ?CBG MONITORING, ED - Abnormal; Notable for the following components:  ? Glucose-Capillary 255 (*)   ? All other components within normal limits  ?TROPONIN I (HIGH SENSITIVITY) - Abnormal; Notable for the following components:  ? Troponin I (High Sensitivity) 18 (*)   ? All other components within normal limits  ?RESP PANEL BY RT-PCR (FLU A&B, COVID) [id number]  ?CULTURE, BLOOD (ROUTINE X 2)  ?CULTURE, BLOOD (ROUTINE X 2)  ?URINE CULTURE  ?LACTIC ACID, PLASMA  ?TROPONIN I (HIGH SENSITIVITY)  ? ? ?EKG ?EKG Interpretation ? ?Date/Time:  Thursday Apr 25 2022 10:46:27 EDT ?Ventricular Rate:  129 ?PR Interval:  152 ?QRS Duration: 93 ?QT Interval:  303 ?QTC Calculation: 444 ?R Axis:   -14 ?Text Interpretation: Sinus tachycardia with irregular rate LVH with secondary repolarization abnormality Inferior infarct, old Anterior Q waves, possibly due to LVH No significant change since last tracing Confirmed by Isla Pence [phone]) on 04/25/2022 10:58:44 AM ? ?Radiology ?CT CHEST ABDOMEN PELVIS W CONTRAST ? ?Result Date: 04/25/2022 ?CLINICAL DATA:  Abdominal pain, congestion, fever, confusion, vomiting. History CHF, obesity, Sean [name], diabetes mellitus, hypertension, former smoker EXAM: CT CHEST, ABDOMEN, AND PELVIS WITH CONTRAST TECHNIQUE: Multidetector CT imaging of the chest, abdomen and pelvis was performed following the standard protocol during bolus administration of intravenous contrast. RADIATION DOSE REDUCTION: This exam was performed according to the departmental dose-optimization program which includes automated exposure  control, adjustment of the mA and/or kV according to patient size and/or use of iterative reconstruction technique. CONTRAST:  167m OMNIPAQUE IOHEXOL 300 MG/ML SOLN IV. No oral contrast. COMPARISON:  None FINDINGS: CT CHEST FINDINGS Cardiovascular: Atherosclerotic calcifications aorta, proximal great vessels, and coronary arteries. Post CABG. Aorta normal caliber. Enlargement of cardiac chambers. No pericardial effusion. Pulmonary arteries grossly patent on non targeted exam. Mediastinum/Nodes: Esophagus unremarkable. Base of cervical region normal appearance. Upper normal sized mediastinal lymph nodes. No thoracic adenopathy. Lungs/Pleura: Emphysematous changes. Peribronchial thickening. Minimal dependent atelectasis in lower lobes and base of lingula. No acute infiltrate, pleural effusion, or pneumothorax. Musculoskeletal: No acute osseous findings. CT ABDOMEN PELVIS FINDINGS Hepatobiliary: Tiny low-attenuation foci within liver up to 10 mm, a few  likely representing small cysts, remainder indeterminate. Dependent gallstones in gallbladder. No biliary dilatation. Pancreas: Normal appearance Spleen: Normal appearance.  Small splenule. Adrenals/Urinary Tract: LEFT adrenal gland normal. RIGHT adrenal mass 4.2 x 4.1 cm image 60, 39 HU on portal venous phase, 36 HU on delayed images. Two LEFT renal cysts, larger 3.1 x 2.8 cm; no follow-up imaging recommended. Kidneys and ureters otherwise normal appearance. Mild nonspecific stranding in perinephric fat planes. Dependent calculi within urinary bladder. Small anterior diverticulum at superior bladder. Stomach/Bowel: Extensive distal colonic diverticulosis without evidence of diverticulitis. Normal appendix. Small hiatal hernia. Stomach and bowel loops otherwise normal appearance. Vascular/Lymphatic: Extensive atherosclerotic calcifications aorta and iliac arteries. Significant atherosclerotic calcifications at the origins of the visceral arteries. Aorta normal caliber.  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 No adenopathy. Reproductive: Minimal prostatic enlargement. Seminal vesicles unremarkable Other: No free air or free fluid. Interme

## 2022-04-25 NOTE — Assessment & Plan Note (Addendum)
Both hands, new onset recently per wife in the past couple of days. ?1. ? Related to CNS lesion on CT ?2. MRI brain pending ?3. Doesn't seem to be having any generalized seizure activity, pt is AAO, on BIPAP, etc. ?4. Replace K, check Mg ?

## 2022-04-25 NOTE — ED Triage Notes (Signed)
Patient arrived to ED with complains of chest congestion/discomfort - per wife he has been intermittently confused x 3 days. Patient c/o orthopnea x 2 nights possible fevers.  ?Patient reports continuing neck pain that has been evaluated by his pcp. ?

## 2022-04-25 NOTE — Assessment & Plan Note (Addendum)
See above ??Pheo with S.Tach, CHF exacerbation, tremor, and imaging findings that have radiologist questioning this? ? Pheo is pretty rare though... ?1. Urine and plasma metanephrines ?2. Renin and aldosterone ?3. Cortisol ?

## 2022-04-25 NOTE — ED Notes (Signed)
Wife at bedside , pt back from CT , placed on BIPAP per resp  pt not as sob or diaphoreric ?

## 2022-04-25 NOTE — Assessment & Plan Note (Signed)
Again, see above. ?Will see with PET CT shows. ?

## 2022-04-25 NOTE — Assessment & Plan Note (Addendum)
Worrisome for malignancy with adrenal mass / met, and ? Brain met. ?Also a neck mass too (not yet called on imaging). ?1. MRI brain with and without ?2. IR eval for biopsy(s) ?3. Will order PET scan. ?4. NPO after MN ?

## 2022-04-25 NOTE — Assessment & Plan Note (Signed)
1. Hold metformin ?2. Sensitive SSI Q4H ?

## 2022-04-25 NOTE — Assessment & Plan Note (Addendum)
Question exacerbation due to missed lasix doses recently? ?1. CHF pathway ?2. Lasix '40mg'$  IV daily for the moment ?3. BMP daily ?4. Strict intake and output. ?5. Tele monitor ?6. Cont home BB (especially with mild S.Tach on monitor) ?1. Consider IV metoprolol after PE ruled out. ?7. Remainder of home med-rec pending. ?

## 2022-04-25 NOTE — Assessment & Plan Note (Addendum)
86% on 2L ?COPD exacerbation secondary to CHF exacerbation? ?PE also in the DDx. ?1. Rescue BIPAP for the moment. ?2. Wean as tolerated. ?3. Check D.Dimer: pt with recent travel history + leg swelling. ?1. CT chest did get some contrast opacification of pulmonary artery, enough, I think, to say: 'no large central PE', but not enough to say anything about periphery. ?2. Not sure why EDP didn't consider PE given recent travel history. ?4. Given all contrast given in ED thus far today, may want to do VQ scan in AM if D.Dimer positive. ?5. Cant anti coag yet until MRI excludes hemorrhage ?

## 2022-04-25 NOTE — Progress Notes (Signed)
Plan of Care Note for accepted transfer ? ? ?Patient: ACEA YAGI MRN: [id number]   DOA: 04/25/2022 ? ?Facility requesting transfer: [facility] ?Requesting Provider: Gilford Raid ?Reason for transfer: SOB/CP ? ?Facility course: Patient with h/o CAD s/p CABG; chronic combined CHF; DM; HLD; COPD; and HTN presenting with cough/CP.  He started taking Lasix but worsening swelling, orthopnea.  Elevated BNP.  T99.8, WBC 16.  CXR with ?PNA.  Troponin 18.  Elevated HR, RR.  O2 86%, on 2L [place] O2.  Diagnosed with "pinched nerve" 8 days ago, CT with large presacral mass and R adrenal mass - concerning for malignancy.  Needs admission for CHF vs. PNA/COPD with likely malignancy.  He is stable for transfer but needs progressive care bed.  They are considering diuresis.  Head CT pending.   ?     ? ?Plan of care: ?The patient is accepted for admission to Progressive unit, at [facility]. ? ? ?Author: ?Karmen Bongo, MD ?04/25/2022 ? ?Check [email or web address] for on-call coverage. ? ?Nursing staff, Please call [place] number on Amion as soon as patient's arrival, so appropriate admitting provider can evaluate the pt. ?

## 2022-04-25 NOTE — ED Notes (Signed)
Wet wash cloths to cool patient  ?

## 2022-04-25 NOTE — ED Notes (Signed)
Recliner in patients room for family members comfort  ?

## 2022-04-25 NOTE — ED Notes (Signed)
Patient off the floor for scan  

## 2022-04-26 DIAGNOSIS — I5023 Acute on chronic systolic (congestive) heart failure: Secondary | ICD-10-CM | POA: Diagnosis not present

## 2022-04-26 DIAGNOSIS — E278 Other specified disorders of adrenal gland: Secondary | ICD-10-CM | POA: Diagnosis not present

## 2022-04-26 DIAGNOSIS — J9621 Acute and chronic respiratory failure with hypoxia: Secondary | ICD-10-CM | POA: Diagnosis not present

## 2022-04-26 DIAGNOSIS — J441 Chronic obstructive pulmonary disease with (acute) exacerbation: Secondary | ICD-10-CM | POA: Diagnosis not present

## 2022-04-26 LAB — BLOOD CULTURE ID PANEL (REFLEXED) - BCID2

## 2022-04-26 LAB — URINE CULTURE: Culture: NO GROWTH

## 2022-04-26 LAB — GLUCOSE, CAPILLARY
Glucose-Capillary: 156 mg/dL — ABNORMAL HIGH (ref 70–99)
Glucose-Capillary: 180 mg/dL — ABNORMAL HIGH (ref 70–99)
Glucose-Capillary: 184 mg/dL — ABNORMAL HIGH (ref 70–99)
Glucose-Capillary: 195 mg/dL — ABNORMAL HIGH (ref 70–99)
Glucose-Capillary: 196 mg/dL — ABNORMAL HIGH (ref 70–99)
Glucose-Capillary: 207 mg/dL — ABNORMAL HIGH (ref 70–99)

## 2022-04-26 LAB — HEMOGLOBIN A1C
Hgb A1c MFr Bld: 6.5 % — ABNORMAL HIGH (ref 4.8–5.6)
Mean Plasma Glucose: 139.85 mg/dL

## 2022-04-26 LAB — BASIC METABOLIC PANEL
Anion gap: 12 (ref 5–15)
BUN: 17 mg/dL (ref 8–23)
CO2: 29 mmol/L (ref 22–32)
Calcium: 8.8 mg/dL — ABNORMAL LOW (ref 8.9–10.3)
Chloride: 94 mmol/L — ABNORMAL LOW (ref 98–111)
Creatinine, Ser: 0.91 mg/dL (ref 0.61–1.24)
GFR, Estimated: >60 mL/min (ref >60)
Glucose, Bld: 143 mg/dL — ABNORMAL HIGH (ref 70–99)
Potassium: 3.6 mmol/L (ref 3.5–5.1)
Sodium: 135 mmol/L (ref 135–145)

## 2022-04-26 LAB — HIV ANTIBODY (ROUTINE TESTING W REFLEX): HIV Screen 4th Generation wRfx: NONREACTIVE

## 2022-04-26 LAB — CORTISOL: Cortisol, Plasma: 49.2 ug/dL

## 2022-04-26 LAB — MAGNESIUM: Magnesium: 2.1 mg/dL (ref 1.7–2.4)

## 2022-04-26 LAB — D-DIMER, QUANTITATIVE: D-Dimer, Quant: 2.96 ug/mL-FEU — ABNORMAL HIGH (ref 0.00–0.50)

## 2022-04-26 MED ORDER — METOPROLOL SUCCINATE ER 25 MG PO TB24
25.0000 mg | ORAL_TABLET | Freq: Two times a day (BID) | ORAL | Status: DC
  Administered 2022-04-26 – 2022-04-27 (×2): 25 mg via ORAL
  Filled 2022-04-26 (×2): qty 1

## 2022-04-26 MED ORDER — METHOCARBAMOL 500 MG PO TABS
500.0000 mg | ORAL_TABLET | Freq: Three times a day (TID) | ORAL | Status: DC
  Administered 2022-04-26 – 2022-04-29 (×7): 500 mg via ORAL
  Filled 2022-04-26 (×7): qty 1

## 2022-04-26 MED ORDER — ADULT MULTIVITAMIN W/MINERALS CH
1.0000 | ORAL_TABLET | Freq: Every day | ORAL | Status: DC
  Administered 2022-04-26 – 2022-04-29 (×3): 1 via ORAL
  Filled 2022-04-26 (×3): qty 1

## 2022-04-26 MED ORDER — OXYCODONE-ACETAMINOPHEN 5-325 MG PO TABS
1.0000 | ORAL_TABLET | ORAL | Status: DC | PRN
  Administered 2022-04-26: 2 via ORAL
  Administered 2022-04-26: 1 via ORAL
  Administered 2022-04-27 – 2022-04-29 (×7): 2 via ORAL
  Filled 2022-04-26 (×7): qty 2
  Filled 2022-04-26: qty 1
  Filled 2022-04-26: qty 2

## 2022-04-26 MED ORDER — CHLORHEXIDINE GLUCONATE CLOTH 2 % EX PADS
6.0000 | MEDICATED_PAD | Freq: Every day | CUTANEOUS | Status: DC
  Administered 2022-04-27 (×2): 6 via TOPICAL

## 2022-04-26 MED ORDER — ASPIRIN EC 81 MG PO TBEC
81.0000 mg | DELAYED_RELEASE_TABLET | Freq: Every day | ORAL | Status: DC
  Administered 2022-04-26 – 2022-04-27 (×2): 81 mg via ORAL
  Filled 2022-04-26 (×2): qty 1

## 2022-04-26 MED ORDER — ENSURE ENLIVE PO LIQD
237.0000 mL | Freq: Three times a day (TID) | ORAL | Status: DC
  Administered 2022-04-26 – 2022-04-27 (×5): 237 mL via ORAL

## 2022-04-26 MED ORDER — DORZOLAMIDE HCL 2 % OP SOLN
1.0000 [drp] | Freq: Two times a day (BID) | OPHTHALMIC | Status: DC
  Administered 2022-04-27 – 2022-05-09 (×25): 1 [drp] via OPHTHALMIC
  Filled 2022-04-26 (×2): qty 10

## 2022-04-26 NOTE — Progress Notes (Signed)
Patients heart rate when working with PT and OT went up to the upper 130s. But when resting, it is still in the 120s. He has been Sinus Tach since he got on the floor. Patient took his Toprol this am for me. I updated Dr. Bonner Puna via secure chat ? ?

## 2022-04-26 NOTE — Progress Notes (Signed)
?Progress Note ? ?Patient: [name] WVP:[id number] DOB: 1941/08/04  ?DOA: 04/25/2022  DOS: 04/26/2022  ?  ?Brief hospital course: ?[name] is a 81 y.o. 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 This is in setting of long distance travel for work, and d-dimer is elevated prompting V/Q scan. Further details below. ? ?Assessment and Plan: ?Acute on chronic hypoxic respiratory failure: Presumably due to acute CHF and COPD exacerbation, CT w/pulmonary parenchyma shows emphysema with peribronchial thickening, minimal atelectasis and no effusion/infiltrate/PTX. No wheezes on exam.  ?- V/Q scan not performed yet today. Consider changing to CTA chest if renal function improves. D=dimer elevated, long distance travel. Check LE U/S though reported swelling is significantly improved from baseline.  ?- Weaned from BiPAP to supplemental oxygen, improved but not at baseline.  ?- Awaiting MRI brain to delineate abnormality noted on CT head as ICH not ruled out. This would then allow for anticoagulation.  ?- Continue baseline COPD medications and prn albuterol. May begin steroids if not able to continue oxygen weaning efforts, though again, not wheezing currently.  ? ?Severe back pain: Relatively abrupt onset, radiates up and downward, after long distance driving. +Paraspinal spasm on exam without step offs or midline point tenderness. No myelopathy/radiculopathy. History of symptomatic ureterolithiasis, though no obstructing stone noted on CT and no RBCs in UA. Nonspecific mild perinephric fat plan stranding but no pyuria, negative culture. CT C/A/P not dedicated to spine, though no acute osseous findings per radiology and my personal interpretation.  ?- Oxycodone prn ?- Add robaxin and continue heating pad.  ?- If renal function stable, consider NSAID in AM ?- Further evaluation to be  guided by clinical course. ? 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 Missed doses of lasix as he does often when on long road trips. BNP elevated to 540. ?- LE edema actually improved from prior, but orthopnea worse.  ?- Continue metoprolol. Increase dose by giving succinate BID for now. ? ?CAD s/p CABG: Troponin negative at 14. Nonischemic ECG ?- Continue aspirin, metoprolol, statin ? ?Presacral mass: Indeterminate, incidental. Not rim-enhancing. ?- Discussed with IR. With multiple targets, I'm not sure what is signal and what is noise. I would suggest we perform a PET-CT prior to selecting a target. They feel this is prudent and will be available if plans change.   ? 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 ?- Urine and plasma metanephrines, renin and aldosterone pending ?- Cortisol is 49.2 ?- Needs PET to delineate this as well as presacral density on CT.  ? ?Neck mass: CT showed lipoma, which is consistent with palpation on exam.  ? ?Aphonia, history of dysphagia: Unclear etiology in this case.  ?- SLP evaluation requested prior to starting diet. No other focal deficits, but checking MRI brain as discussed above. Esophagram Feb 2022 showed mild dysmotility without stricture or mass.  ? ?Tremor: Nonspecific, relatively acute. As I witnessed, not consistent with seizure, not contemporaneous to hypoglycemia.   ?- MRI brain pending.   ? ?NIDT2DM: HbA1c 6.5%.  ?- Continue SSI, hold metformin with contrast exposures.  ? ?Leukocytosis: Without fever. UA negative, no CXR infiltrate, no meningismus, no large cutaneous infection is evident. This may be chronic based on the only 2 prior examples in 2018.  ?- Add diff in  AM.  ?- Monitor blood cultures drawn at admission ?- Monitor off further abx for now (got CTX, azithro) ? ?Obesity: Estimated body mass index is 37.08 kg/m? as calculated from the following: ?  Height as of this encounter: '5\' 6"'$  (1.676 m). ?  Weight as of this  encounter: 104.2 kg. ? ?Subjective: Pain in "whole body" includes chest which is reason for him coming in. This has continued, no change as of this morning, found it difficult to describe. Taken off BiPAP this AM with improved dyspnea, he has no voice for unclear reasons.  ? ?Objective: ?Vitals:  ? 04/26/22 0406 04/26/22 0801 04/26/22 0917 04/26/22 1700  ?BP: (!) 142/76 118/70    ?Pulse: (!) 123 (!) 108  (!) 120  ?Resp: (!) '22 20  18  '$ ?Temp: 99.1 ?F (37.3 ?C) 98.9 ?F (37.2 ?C)    ?TempSrc: Axillary Oral    ?SpO2: 99% 100% 98% 95%  ?Weight: 104.2 kg     ?Height: '5\' 6"'$  (1.676 m)     ? ?Gen: Elderly male in no distress ?Neck: No thyromegaly, palpable left neck lipoma nontender. ?Pulm: Mildly labored tachypnea this AM, nonlabored this PM with supplemental oxygen. No crackles or wheezes on exam at this time. ?CV: Regular tachycardia without murmur, rub, or gallop. No JVD, trace-to-1+ dependent edema. ?GI: Abdomen soft, non-tender, non-distended, with normoactive bowel sounds.  ?MSK: No midline spine tenderness, visible or palpable deformities or wounds, no step offs. Paraspinal spasm throughout, most evident in lumbar region.  ?Skin: No rashes, lesions or ulcers on visualized skin. ?Neuro: Alert and oriented. Aphonic but no focal neurological deficits. Motor and sensory function in legs is normal. ?Psych: Judgement and insight appear questionable. Mood euthymic & affect congruent. Behavior is appropriate.   ? ?Data Personally reviewed: ?CBC: ?Recent Labs  ?Lab 04/25/22 ?1104 04/25/22 ?1706  ?WBC 16.0*  --   ?HGB 14.0 13.9  ?HCT 42.3 41.0  ?MCV 89.4  --   ?PLT 282  --   ? ?Basic Metabolic Panel: ?Recent Labs  ?Lab 04/25/22 ?1104 04/25/22 ?1706 04/26/22 ?0245  ?NA 134* 133* 135  ?K 3.5 3.1* 3.6  ?CL 96*  --  94*  ?CO2 26  --  29  ?GLUCOSE 196*  --  143*  ?BUN 20  --  17  ?CREATININE 1.02  --  0.91  ?CALCIUM 9.2  --  8.8*  ?MG  --   --  2.1  ? ?GFR: ?Estimated Creatinine Clearance: 75.7 mL/min (by C-G formula based on SCr  of 0.91 mg/dL). ? ?HbA1C: ?Recent Labs  ?  04/26/22 ?0245  ?HGBA1C 6.5*  ? ?CBG: ?Recent Labs  ?Lab 04/26/22 ?0018 04/26/22 ?7673 04/26/22 ?4193 04/26/22 ?1202 04/26/22 ?1619  ?GLUCAP 180* 156* 195* 184* 207*  ? ?Urine analysis: ?   ?Component Value Date/Time  ? COLORURINE YELLOW 04/25/2022 1317  ? APPEARANCEUR CLEAR 04/25/2022 1317  ? LABSPEC 1.020 04/25/2022 1317  ? PHURINE 5.0 04/25/2022 1317  ? GLUCOSEU 100 (A) 04/25/2022 1317  ? HGBUR TRACE (A) 04/25/2022 1317  ? BILIRUBINUR SMALL (A) 04/25/2022 1317  ? KETONESUR 40 (A) 04/25/2022 1317  ? PROTEINUR 100 (A) 04/25/2022 1317  ? NITRITE NEGATIVE 04/25/2022 1317  ? LEUKOCYTESUR NEGATIVE 04/25/2022 1317  ? ?Recent Results (from the past 240 hour(s))  ?Resp Panel by RT-PCR (Flu A&B, Covid) Nasopharyngeal Swab     Status: None  ? Collection Time: 04/25/22 11:04 AM  ? Specimen: Nasopharyngeal Swab; Nasopharyngeal(NP) swabs in vial transport medium  ?Result Value Ref Range Status  ?  SARS Coronavirus 2 by RT PCR NEGATIVE NEGATIVE Final  ?  Comment: (NOTE) ?SARS-CoV-2 target nucleic acids are NOT DETECTED. ? ?The SARS-CoV-2 RNA is generally detectable in upper respiratory ?specimens during the acute phase of infection. The lowest ?concentration of SARS-CoV-2 viral copies this assay can detect is ?138 copies/mL. A negative result does not preclude SARS-Cov-2 ?infection and should not be used as the sole basis for treatment or ?other patient management decisions. A negative result may occur with  ?improper specimen collection/handling, submission of specimen other ?than nasopharyngeal swab, presence of viral mutation(s) within the ?areas targeted by this assay, and inadequate number of viral ?copies(<138 copies/mL). A negative result must be combined with ?clinical observations, patient history, and epidemiological ?information. The expected result is Negative. ? ?Fact Sheet for Patients:  ?EntrepreneurPulse.com.au ? ?Fact Sheet for Healthcare Providers:   ?IncredibleEmployment.be ? ?This test is no t yet approved or cleared by the [place] FDA and  ?has been authorized for detection and/or diagnosis of SARS-CoV-2 by ?FDA under an Eme

## 2022-04-26 NOTE — Evaluation (Signed)
Physical Therapy Evaluation ?Patient Details ?Name: [name] ?MRN: [id number] ?DOB: 01/12/1941 ?Today's Date: 04/26/2022 ? ?History of Present Illness ? 81 y.o. male adm 5/11 with edema and orthopnea with acute on chronic CHF and COPD. 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 PMhx: COPD on O2, HFrEF, DM2, CAD s/p CABG, depression, macular degeneration, HTN, HLD  ?Clinical Impression ? Pt supine on arrival with frequent repositioning due to pain. Pt premedicated and agreeable to attempt mobility for pain relief. Pt able to rise and transition to [facility] and chair with assist but unable to maintain any static position >5 min with frequent moving, repositioning and ultimate transfer back to bed. Pt lives with wife and is normally independent and still working. Pt with limited strength, function and mobility due to pain who will benefit from acute therapy to maximize mobility and safety to decrease burden of care with potential need for ST-SNf pending medical workup and pain control.    ? ?HR up to 130 with limited mobility and SPO2 93% on 2L ?   ? ?Recommendations for follow up therapy are one component of a multi-disciplinary discharge planning process, led by the attending physician.  Recommendations may be updated based on patient status, additional functional criteria and insurance authorization. ? ?Follow Up Recommendations Skilled nursing-short term rehab (<3 hours/day) ? ?  ?Assistance Recommended at Discharge Frequent or constant Supervision/Assistance  ?Patient can return home with the following ? A little help with bathing/dressing/bathroom;Assistance with cooking/housework;Assist for transportation;A lot of help with walking and/or transfers;Help with stairs or ramp for entrance ? ?  ?Equipment Recommendations Rolling walker (2 wheels);BSC/3in1  ?Recommendations for Other Services ?    ?  ?Functional Status Assessment Patient has had a recent decline in their  functional status and demonstrates the ability to make significant improvements in function in a reasonable and predictable amount of time.  ? ?  ?Precautions / Restrictions Precautions ?Precautions: Fall;Other (comment) ?Precaution Comments: watch HR  ? ?  ? ?Mobility ? Bed Mobility ?Overal bed mobility: Needs Assistance ?Bed Mobility: Supine to Sit, Sit to Supine, Rolling ?Rolling: Min assist ?  ?Supine to sit: Min assist ?Sit to supine: Min assist ?  ?General bed mobility comments: min assist to roll and rise from side with pt sitting then immediately returning to side due to sharp pain. Pt then attempted 2nd trial able to maintain sitting with eventual return to bed due to pain with assist to lift legs ?  ? ?Transfers ?Overall transfer level: Needs assistance ?  ?Transfers: Sit to/from Stand, Bed to chair/wheelchair/BSC ?Sit to Stand: Min assist ?Stand pivot transfers: Min assist ?  ?  ?  ?  ?General transfer comment: min assist to stand from bed x 2 trials with cues for hand placement and safety. Pt performed stand pivots from bed>BSC>recliner>bed limited in all positions by pain and unable to maintain sitting in any position >5 min ?  ? ?Ambulation/Gait ?  ?  ?  ?  ?  ?  ?  ?General Gait Details: unable due to pain ? ?Stairs ?  ?  ?  ?  ?  ? ?Wheelchair Mobility ?  ? ?Modified Rankin (Stroke Patients Only) ?  ? ?  ? ?Balance Overall balance assessment: Needs assistance ?Sitting-balance support: No upper extremity supported, Feet supported ?Sitting balance-Leahy Scale: Fair ?Sitting balance - Comments: static sitting without assist ?  ?Standing balance support: Single extremity supported, Bilateral upper extremity supported ?Standing balance-Leahy  Scale: Poor ?Standing balance comment: single and bil UE support in standing with assist for pericare and balance ?  ?  ?  ?  ?  ?  ?  ?  ?  ?  ?  ?   ? ? ? ?Pertinent Vitals/Pain Pain Assessment ?Pain Assessment: Faces ?Pain Score: 6  ?Pain Location: intermittent  spasms and sharp pain in back and hips ?Pain Descriptors / Indicators: Sharp, Stabbing ?Pain Intervention(s): Limited activity within patient's tolerance, Monitored during session, Premedicated before session, Repositioned  ? ? ?Home Living Family/patient expects to be discharged to:: Private residence ?Living Arrangements: Spouse/significant other ?Available Help at Discharge: Family;Available 24 hours/day ?Type of Home: House ?Home Access: Stairs to enter ?  ?Entrance Stairs-Number of Steps: 5 ?Alternate Level Stairs-Number of Steps: 14 ?Home Layout: Two level;1/2 bath on main level;Bed/bath upstairs ?Home Equipment: None ?Additional Comments: pt has couch and half bath downstairs. 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  ?Prior Function Prior Level of Function : Independent/Modified Independent ?  ?  ?  ?  ?  ?  ?  ?  ?  ? ? ?Hand Dominance  ?   ? ?  ?Extremity/Trunk Assessment  ? Upper Extremity Assessment ?Upper Extremity Assessment: Generalized weakness ?  ? ?Lower Extremity Assessment ?Lower Extremity Assessment: Generalized weakness (unable to fully assess due to pain) ?  ? ?Cervical / Trunk Assessment ?Cervical / Trunk Assessment: Kyphotic (rounded shoulders with pain limiting function)  ?Communication  ? Communication: HOH;Expressive difficulties (pt very soft spoken)  ?Cognition Arousal/Alertness: Awake/alert ?Behavior During Therapy: [facility] for tasks assessed/performed ?Overall Cognitive Status: Within Functional Limits for tasks assessed ?  ?  ?  ?  ?  ?  ?  ?  ?  ?  ?  ?  ?  ?  ?  ?  ?  ?  ?  ? ?  ?General Comments   ? ?  ?Exercises    ? ?Assessment/Plan  ?  ?PT Assessment Patient needs continued PT services  ?PT Problem List Decreased strength;Decreased mobility;Decreased safety awareness;Decreased activity tolerance;Decreased range of motion;Decreased balance;Decreased knowledge of use of DME;Pain ? ?   ?  ?PT Treatment Interventions Gait training;Balance training;Stair  training;Functional mobility training;Therapeutic activities;Patient/family education;Cognitive remediation;DME instruction;Therapeutic exercise   ? ?PT Goals (Current goals can be found in the Care Plan section)  ?Acute Rehab PT Goals ?Patient Stated Goal: be able to return home ?PT Goal Formulation: With patient/family ?Time For Goal Achievement: 05/10/22 ?Potential to Achieve Goals: Fair ? ?  ?Frequency Min 3X/week ?  ? ? ?Co-evaluation   ?  ?  ?  ?  ? ? ?  ?AM-PAC PT "6 Clicks" Mobility  ?Outcome Measure Help needed turning from your back to your side while in a flat bed without using bedrails?: A Little ?Help needed moving from lying on your back to sitting on the side of a flat bed without using bedrails?: A Little ?Help needed moving to and from a bed to a chair (including a wheelchair)?: A Little ?Help needed standing up from a chair using your arms (e.g., wheelchair or bedside chair)?: A Little ?Help needed to walk in hospital room?: Total ?Help needed climbing 3-5 steps with a railing? : Total ?6 Click Score: 14 ? ?  ?End of Session   ?Activity Tolerance: Patient limited by pain ?Patient left: in bed;with call bell/phone within reach;with bed alarm set;with family/visitor present;with nursing/sitter in room ?Nurse Communication: Mobility status ?PT Visit  Diagnosis: Other abnormalities of gait and mobility (R26.89);Difficulty in walking, not elsewhere classified (R26.2);Muscle weakness (generalized) (M62.81);Pain ?Pain - Right/Left: Right ?Pain - part of body: Hip ?  ? ?Time: 2957-4734 ?PT Time Calculation (min) (ACUTE ONLY): 46 min ? ? ?Charges:   PT Evaluation ?$PT Eval Moderate Complexity: 1 Mod ?PT Treatments ?$Therapeutic Activity: 23-37 mins ?  ?   ? ? ?Calayah Guadarrama P, PT ?Acute Rehabilitation Services ?Pager: [phone] ?Office: [phone] ? ? ?Teagen Bucio B Shiven Junious ?04/26/2022, 1:52 PM ? ?

## 2022-04-26 NOTE — Progress Notes (Signed)
PT Cancellation Note ? ?Patient Details ?Name: [name] ?MRN: [id number] ?DOB: 15-Jun-1941 ? ? ?Cancelled Treatment:    Reason Eval/Treat Not Completed: Other (comment) (pt currently on bipap and not yet ready for therapy per RN. will plan to reattempt) ? ? ?Skyelar Halliday B Bonnye Halle ?04/26/2022, 8:19 AM ?Juvia Aerts P, PT ?Acute Rehabilitation Services ?Pager: [phone] ?Office: [phone] ? ?

## 2022-04-26 NOTE — Progress Notes (Signed)
?   04/25/22 2138  ?Assess: MEWS Score  ?Level of Consciousness Alert  ?Assess: MEWS Score  ?MEWS Temp 0  ?MEWS Systolic 0  ?MEWS Pulse 2  ?MEWS RR 0  ?MEWS LOC 0  ?MEWS Score 2  ?MEWS Score Color Yellow  ?Assess: if the MEWS score is Yellow or Red  ?Were vital signs taken at a resting state? Yes  ?Focused Assessment No change from prior assessment  ?Early Detection of Sepsis Score *See Row Information* High  ?MEWS guidelines implemented *See Row Information* Yes  ?Treat  ?Pain Scale 0-10  ?Pain Score 0  ?Patients Stated Pain Goal 0  ?Take Vital Signs  ?Increase Vital Sign Frequency  Yellow: Q 2hr X 2 then Q 4hr X 2, if remains yellow, continue Q 4hrs  ?Escalate  ?MEWS: Escalate Yellow: discuss with charge nurse/RN and consider discussing with provider and RRT  ?Notify: Charge Nurse/RN  ?Name of Charge Nurse/RN Notified Chanda Busing, RN  ?Date Charge Nurse/RN Notified 04/25/22  ?Time Charge Nurse/RN Notified 2145  ?Document  ?Patient Outcome Stabilized after interventions  ?Progress note created (see row info) Yes  ? ? ?

## 2022-04-26 NOTE — NC FL2 (Addendum)
[place] LEVEL OF CARE SCREENING TOOL     IDENTIFICATION  Patient Name: COLLIER BOHNET Birthdate: [birth year] Sex: male Admission Date (Current Location): 04/25/2022  [facility] and [place] Number:  Herbalist and Address:  [facility], [place] [address] [place], [place] [address]      Provider Number: [id number]  Attending Physician Name and Address:  Shawna Clamp MD  Relative Name and Phone Number:  Hoyle Sauer [phone]    Current Level of Care: Hospital Recommended Level of Care: [place] Prior Approval Number:    Date Approved/Denied:   PASRR Number: [id number]  Discharge Plan: SNF    Current Diagnoses: Patient Active Problem List   Diagnosis Date Noted   Acute on chronic systolic (congestive) heart failure ([place]) 04/25/2022   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   Adrenal mass ([place]) 04/25/2022   Neck mass 04/25/2022   Tremor 04/25/2022   Morbid obesity ([place]) 04/28/2018   Chronic combined systolic and diastolic CHF (congestive heart failure) ([place]) 04/28/2018   CAP (community acquired pneumonia) due to Pneumococcus ([place]) 03/03/2017   Acute respiratory failure with hypoxia ([place])    Pneumonia 03/02/2017   COPD with acute exacerbation ([place]) 03/02/2017   Acute on chronic respiratory failure with hypoxia ([place]) 03/02/2017   Diabetes mellitus type II, non insulin dependent ([place]) 03/02/2017   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HCC)    Claudication ([place])    Kidney stones    Hypertension    Coronary atherosclerosis of native coronary artery    Depression    Anxiety    Hypersomnia    Dyslipidemia     Orientation RESPIRATION BLADDER Height & Weight     Self, Time, Situation, Place  O2 (Nasal Cannula 3 liters) Continent Weight: 229 lb 11.5 oz (104.2 kg) Height:  '5\' 6"'$  (167.6 cm)  BEHAVIORAL SYMPTOMS/MOOD NEUROLOGICAL BOWEL NUTRITION STATUS      Continent (WDL) Diet (Please see discharge summary)  AMBULATORY STATUS  COMMUNICATION OF NEEDS Skin   Extensive Assist Verbally Other (Comment) (appropriate for ethnicity,dry,MASD to scrotum and bilateral posterior,upper,thighs)                       Personal Care Assistance Level of Assistance  Bathing, Feeding, Dressing Bathing Assistance: Limited assistance Feeding assistance: Independent Dressing Assistance: Limited assistance     Functional Limitations Info  Sight, Hearing, Speech Sight Info: Adequate (WDL) Hearing Info: Adequate (WDL) Speech Info: Adequate (WDL)    Andrew  PT (By licensed PT), OT (By licensed OT)     PT Frequency: 5x min weekly OT Frequency: 5x min weekly            Contractures Contractures Info: Not present    Additional Factors Info  Code Status, Allergies, Insulin Sliding Scale Code Status Info: FULL Allergies Info: No Known Allergies   Insulin Sliding Scale Info: insulin aspart (novoLOG) injection 0-9 Units every 4 hours       Current Medications (04/26/2022):  This is the current hospital active medication list Current Facility-Administered Medications  Medication Dose Route Frequency Provider Last Rate Last Admin   0.9 %  sodium chloride infusion  250 mL Intravenous PRN Etta Quill, DO 10 mL/hr at 04/26/22 0006 500 mL at 04/26/22 0006   acetaminophen (TYLENOL) tablet 650 mg  650 mg Oral Q4H PRN Etta Quill, DO       feeding supplement (ENSURE ENLIVE / ENSURE PLUS) liquid 237 mL  237 mL Oral  TID BM Patrecia Pour, MD   237 mL at 04/26/22 1237   insulin aspart (novoLOG) injection 0-9 Units  0-9 Units Subcutaneous Q4H Jennette Kettle M, DO   3 Units at 04/26/22 1627   levalbuterol (XOPENEX) nebulizer solution 0.63 mg  0.63 mg Nebulization Q6H PRN Etta Quill, DO       metoprolol succinate (TOPROL-XL) 24 hr tablet 25 mg  25 mg Oral Daily Alcario Drought, Jared M, DO   25 mg at 04/26/22 1000   mometasone-formoterol (DULERA) 200-5 MCG/ACT inhaler 2 puff  2 puff Inhalation BID  Etta Quill, DO   2 puff at 04/26/22 0917   morphine (PF) 2 MG/ML injection 2-4 mg  2-4 mg Intravenous Q4H PRN Etta Quill, DO   2 mg at 04/26/22 1625   multivitamin with minerals tablet 1 tablet  1 tablet Oral Daily Patrecia Pour, MD   1 tablet at 04/26/22 1232   ondansetron (ZOFRAN) injection 4 mg  4 mg Intravenous Q6H PRN Etta Quill, DO       oxyCODONE-acetaminophen (PERCOCET/ROXICET) 5-325 MG per tablet 1-2 tablet  1-2 tablet Oral Q4H PRN Patrecia Pour, MD   1 tablet at 04/26/22 1232   sodium chloride flush (NS) 0.9 % injection 3 mL  3 mL Intravenous Q12H Jennette Kettle M, DO   3 mL at 04/26/22 0959   sodium chloride flush (NS) 0.9 % injection 3 mL  3 mL Intravenous PRN Etta Quill, DO       umeclidinium bromide (INCRUSE ELLIPTA) 62.5 MCG/ACT 1 puff  1 puff Inhalation Daily Etta Quill, DO   1 puff at 04/26/22 0916   Facility-Administered Medications Ordered in Other Encounters  Medication Dose Route Frequency Provider Last Rate Last Admin   sodium chloride flush (NS) 0.9 % injection 10 mL  10 mL Intravenous PRN Sueanne Margarita, MD   20 mL at 03/05/22 1035     Discharge Medications: Please see discharge summary for a list of discharge medications.  Relevant Imaging Results:  Relevant Lab Results:   Additional Information [phone], Both Covid Vaccines, Has Peg tube   Milas Gain, LCSWA

## 2022-04-26 NOTE — Progress Notes (Signed)
PHARMACY - PHYSICIAN COMMUNICATION ?CRITICAL VALUE ALERT - BLOOD CULTURE IDENTIFICATION (BCID) ? ?[name] is an 81 y.o. male who presented to [facility] on 04/25/2022 with a chief complaint of chest pain and dyspnea ? ?Assessment:  Blood cultures show 1/4 bottles with GPC in chains with BCID showing strep species with no resistance ? ?Name of physician (or Provider) Contacted: Ninetta Lights ? ?Current antibiotics: None ? ?Changes to prescribed antibiotics recommended:  ?-No changes recommended (likely contaminant) ? ?Results for orders placed or performed during the hospital encounter of 04/25/22  ?Blood Culture ID Panel (Reflexed) (Collected: 04/25/2022 12:15 PM)  ?Result Value Ref Range  ? Enterococcus faecalis NOT DETECTED NOT DETECTED  ? Enterococcus Faecium NOT DETECTED NOT DETECTED  ? Listeria monocytogenes NOT DETECTED NOT DETECTED  ? Staphylococcus species NOT DETECTED NOT DETECTED  ? Staphylococcus aureus (BCID) NOT DETECTED NOT DETECTED  ? Staphylococcus epidermidis NOT DETECTED NOT DETECTED  ? Staphylococcus lugdunensis NOT DETECTED NOT DETECTED  ? Streptococcus species DETECTED (A) NOT DETECTED  ? Streptococcus agalactiae NOT DETECTED NOT DETECTED  ? Streptococcus pneumoniae NOT DETECTED NOT DETECTED  ? Streptococcus pyogenes NOT DETECTED NOT DETECTED  ? A.calcoaceticus-baumannii NOT DETECTED NOT DETECTED  ? Bacteroides fragilis NOT DETECTED NOT DETECTED  ? Enterobacterales NOT DETECTED NOT DETECTED  ? Enterobacter cloacae complex NOT DETECTED NOT DETECTED  ? Escherichia coli NOT DETECTED NOT DETECTED  ? Klebsiella aerogenes NOT DETECTED NOT DETECTED  ? Klebsiella oxytoca NOT DETECTED NOT DETECTED  ? Klebsiella pneumoniae NOT DETECTED NOT DETECTED  ? Proteus species NOT DETECTED NOT DETECTED  ? Salmonella species NOT DETECTED NOT DETECTED  ? Serratia marcescens NOT DETECTED NOT DETECTED  ? Haemophilus influenzae NOT DETECTED NOT DETECTED  ? Neisseria meningitidis NOT DETECTED NOT DETECTED  ?  Pseudomonas aeruginosa NOT DETECTED NOT DETECTED  ? Stenotrophomonas maltophilia NOT DETECTED NOT DETECTED  ? Candida albicans NOT DETECTED NOT DETECTED  ? Candida auris NOT DETECTED NOT DETECTED  ? Candida glabrata NOT DETECTED NOT DETECTED  ? Candida krusei NOT DETECTED NOT DETECTED  ? Candida parapsilosis NOT DETECTED NOT DETECTED  ? Candida tropicalis NOT DETECTED NOT DETECTED  ? Cryptococcus neoformans/gattii NOT DETECTED NOT DETECTED  ? ?Hildred Laser, PharmD ?Clinical Pharmacist ?**Pharmacist phone directory can now be found on [email or web address] ([email or web address]).  Listed under [place]. ? ? ?

## 2022-04-26 NOTE — Evaluation (Signed)
Clinical/Bedside Swallow Evaluation ?Patient Details  ?Name: [name] ?MRN: [id number] ?Date of Birth: 1941-04-13 ? ?Today's Date: 04/26/2022 ?Time: SLP Start Time (ACUTE ONLY): 1410 SLP Stop Time (ACUTE ONLY): 1430 ?SLP Time Calculation (min) (ACUTE ONLY): 20 min ? ?Past Medical History:  ?Past Medical History:  ?Diagnosis Date  ? Anxiety   ? CAD (coronary artery disease)   ?  s/p CABG with LIMA to LAD, SVG to diag, SVG to OM, SVG to RCA cath 2002 Dr. Radford Pax  ? CAP (community acquired pneumonia) due to Pneumococcus ([place]) 03/03/2017  ? Cataract   ? Chronic combined systolic and diastolic CHF (congestive heart failure) ([place]) 04/28/2018  ? Chronic kidney disease   ? Claudication [facility])   ? normal ABIs  ? COPD (chronic obstructive pulmonary disease) ([place])   ? Depression   ? Diabetes mellitus without complication ([place])   ? Dyslipidemia   ? Hypersomnia   ? Hypertension   ? 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[place])   ? EF 40% with apical AK at cath intolerant to ACE I and ARBS, EF 43% by nuclear stress test 08/2010  ? Kidney stones   ? Macular degeneration   ? Morbid obesity ([place]) 04/28/2018  ? ?Past Surgical History:  ?Past Surgical History:  ?Procedure Laterality Date  ? CORONARY ARTERY BYPASS GRAFT    ? with LIMA to LAD, SVG to diag, SVG to OM, SVG to RCA cath 2002 Dr. Radford Pax  ? urologic procedure for fertility    ? ?HPI:  ?81 y.o. male adm 5/11 with edema and orthopnea with acute on chronic CHF and COPD. 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 PMhx: COPD on O2, HFrEF, DM2, CAD s/p CABG, depression, macular degeneration, HTN, HLD  ?  ?Assessment / Plan / Recommendation  ?Clinical Impression ? Pt is a 81 year old who until admission was independent and drove solo to [place] Tuesday of this week. His wife described a period of time last year when he "was unable to swallow" (no ST records on file). Pt and wife deny GERD or esophgeal related issues. Presently his vocal  quality is breathy with onset over past few days. He states he has preferred soft foods since admit (ice cream). Wife stated he has been coughing without po's for most of the day and does frequently with COPD which has worsened with new CHF. Swallow ability was inconsistent during BSE. He did not have a baseline cough with therapist prior to po's and consistently coughed and throat cleared after cup and straw sips water. No coughing with Ensure plus and nectar thick liquids although he continued to have throat clears. Difficult to determine nature of coughing with hx of COPD, new CHF and little to no phonation. Discussed options with pt and wife re: thin versus thick liquids, modified diet versus regular and pt/wife in agreement with initiating thin liquids and Ensure and regular texture (pt will donn dentures) and ordering food he feels he can tolerate. Educated pt to ensure he is in an upright position with small sips. Recommend ST continue to follow and if continues to exhibit s/sx aspiration can assess with objective instrumentation. ?SLP Visit Diagnosis: Dysphagia, unspecified (R13.10) ?   ?Aspiration Risk ? Mild aspiration risk  ?  ?Diet Recommendation Regular;Thin liquid  ? ?Liquid Administration via: Straw;Cup ?Medication Administration: Whole meds with liquid ?Supervision: Patient able to self feed ?Compensations: Slow rate;Small sips/bites ?Postural Changes: Seated upright at 90 degrees  ?  ?  Other  Recommendations Oral Care Recommendations: Oral care BID   ? ?Recommendations for follow up therapy are one component of a multi-disciplinary discharge planning process, led by the attending physician.  Recommendations may be updated based on patient status, additional functional criteria and insurance authorization. ? ?Follow up Recommendations  (TBD)  ? ? ?  ?Assistance Recommended at Discharge Intermittent Supervision/Assistance  ?Functional Status Assessment Patient has had a recent decline in their  functional status and demonstrates the ability to make significant improvements in function in a reasonable and predictable amount of time.  ?Frequency and Duration min 2x/week  ?2 weeks ?  ?   ? ?Prognosis Prognosis for Safe Diet Advancement: Good  ? ?  ? ?Swallow Study   ?General Date of Onset: 04/25/22 ?HPI: 81 y.o. male adm 5/11 with edema and orthopnea with acute on chronic CHF and COPD. 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 PMhx: COPD on O2, HFrEF, DM2, CAD s/p CABG, depression, macular degeneration, HTN, HLD ?Type of Study: Bedside Swallow Evaluation ?Previous Swallow Assessment:  (none) ?Diet Prior to this Study: NPO ?Temperature Spikes Noted: No ?Respiratory Status: Nasal cannula ?History of Recent Intubation: No ?Behavior/Cognition: Alert;Pleasant mood;Cooperative (mildly HOH) ?Oral Cavity Assessment: Other (comment) (lingual candidia?) ?Oral Care Completed by SLP: No ?Oral Cavity - Dentition: Edentulous (wife has dentures- pt preferred to keep them out) ?Vision: Functional for self-feeding ?Self-Feeding Abilities: Able to feed self (shaky) ?Patient Positioning: Upright in bed ?Baseline Vocal Quality: Low vocal intensity;Breathy ?Volitional Cough: Weak ?Volitional Swallow: Able to elicit  ?  ?Oral/Motor/Sensory Function Overall Oral Motor/Sensory Function: Within functional limits   ?Ice Chips Ice chips: Not tested   ?Thin Liquid Thin Liquid: Impaired ?Presentation: Cup;Straw ?Oral Phase Impairments:  (none) ?Oral Phase Functional Implications:  (none) ?Pharyngeal  Phase Impairments: Throat Clearing - Immediate;Cough - Immediate  ?  ?Nectar Thick Nectar Thick Liquid: Impaired ?Presentation: Straw ?Oral Phase Impairments:  (none) ?Oral phase functional implications:  (none) ?Pharyngeal Phase Impairments: Throat Clearing - Immediate   ?Honey Thick Honey Thick Liquid: Not tested   ?Puree Puree: Within functional limits   ?Solid ? ? ?  Solid: Not tested (did  not want to donn dentures and eats with dentures)  ? ?  ? ?Houston Siren ?04/26/2022,5:09 PM ? ? ? ?

## 2022-04-26 NOTE — Progress Notes (Addendum)
RE: [name]  ? ?Date of Birth: 03/07/1941 ? ?Date: 04/26/2022 ? ?To Whom It May Concern: ? ?Please be advised that the above-named patient will require a short-term nursing home stay - anticipated 30 days or less for rehabilitation and strengthening. The plan is for return home. ?

## 2022-04-26 NOTE — TOC Initial Note (Addendum)
Transition of Care (TOC) - Initial/Assessment Note  ? ? ?Patient Details  ?Name: [name] ?MRN: [id number] ?Date of Birth: 08/11/1941 ? ?Transition of Care (TOC) CM/SW Contact:    ?Milas Gain, LCSWA ?Phone Number: ?04/26/2022, 3:51 PM ? ?Clinical Narrative:                 ? ?CSW received consult for possible SNF placement at time of discharge. CSW spoke with patient and patients spouse at bedside regarding PT recommendation of SNF placement at time of discharge. Patient request for CSW to talk to spouse regarding dc plan. CSW spoke with patients spouse and Patients spouse expressed understanding of PT recommendation and is agreeable to SNF placement for patient at time of discharge. Patients spouse gave CSW permission to fax out initial referral near the [place] area. CSW discussed insurance authorization process with patients spouse and CSW will provide Medicare SNF ratings list with accepted SNF bed offers for patient when available. Patients spouse reports patient has received both COVID vaccines. Patients passr pending. CSW submitted clinicals to [place] must for review. No further questions reported at this time. CSW to continue to follow and assist with discharge planning needs.  ? ?Expected Discharge Plan: [place] ?Barriers to Discharge: Continued Medical Work up ? ? ?Patient Goals and CMS Choice ?Patient states their goals for this hospitalization and ongoing recovery are:: SNF ?CMS Medicare.gov Compare Post Acute Care list provided to:: Patient Represenative (must comment) (Patients spouse Hoyle Sauer) ?Choice offered to / list presented to : Spouse (Patients spouse Hoyle Sauer) ? ?Expected Discharge Plan and Services ?Expected Discharge Plan: [place] ?In-house Referral: Clinical Social Work ?  ?  ?Living arrangements for the past 2 months: [place] ?                ?  ?  ?  ?  ?  ?  ?  ?  ?  ?  ? ?Prior Living Arrangements/Services ?Living arrangements for the past  2 months: [place] ?Lives with:: Spouse ?Patient language and need for interpreter reviewed:: Yes ?       ?Need for Family Participation in Patient Care: Yes (Comment) ?Care giver support system in place?: Yes (comment) ?  ?Criminal Activity/Legal Involvement Pertinent to Current Situation/Hospitalization: No - Comment as needed ? ?Activities of Daily Living ?Home Assistive Devices/Equipment: Oxygen ?ADL Screening (condition at time of admission) ?Patient's cognitive ability adequate to safely complete daily activities?: Yes ?Is the patient deaf or have difficulty hearing?: No ?Does the patient have difficulty seeing, even when wearing glasses/contacts?: No ?Does the patient have difficulty concentrating, remembering, or making decisions?: No ?Patient able to express need for assistance with ADLs?: No ?Does the patient have difficulty dressing or bathing?: No ?Independently performs ADLs?: Yes (appropriate for developmental age) ?Does the patient have difficulty walking or climbing stairs?: No ?Weakness of Legs: Both ?Weakness of Arms/Hands: None ? ?Permission Sought/Granted ?Permission sought to share information with : Case Manager, Family Supports, Customer service manager ?Permission granted to share information with : Yes, Verbal Permission Granted ? Share Information with NAME: Hoyle Sauer ? Permission granted to share info w AGENCY: SNF ? Permission granted to share info w Relationship: spouse ? Permission granted to share info w Contact Information: Hoyle Sauer [phone] ? ?Emotional Assessment ?Appearance:: Appears stated age ?Attitude/Demeanor/Rapport: Gracious ?Affect (typically observed): Calm ?Orientation: : Oriented to Self, Oriented to Place, Oriented to  Time, Oriented to Situation ?Alcohol / Substance Use: Not Applicable ?Psych Involvement: No (comment) ? ?  Admission diagnosis:  Abdominal mass of other site [R19.09] ?Acute on chronic systolic (congestive) heart failure ([place])  [I50.23] ?Acute on chronic congestive heart failure, unspecified heart failure type ([place]) [I50.9] ?Patient Active Problem List  ? Diagnosis Date Noted  ? Acute on chronic systolic (congestive) heart failure ([place]) 04/25/2022  ? 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 Neck mass 04/25/2022  ? Tremor 04/25/2022  ? Morbid obesity ([place]) 04/28/2018  ? Chronic combined systolic and diastolic CHF (congestive heart failure) ([place]) 04/28/2018  ? CAP (community acquired pneumonia) due to Pneumococcus ([place]) 03/03/2017  ? Acute respiratory failure with hypoxia ([place])   ? Pneumonia 03/02/2017  ? COPD with acute exacerbation ([place]) 03/02/2017  ? Acute on chronic respiratory failure with hypoxia ([place]) 03/02/2017  ? Diabetes mellitus type II, non insulin dependent ([place]) 03/02/2017  ? 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[place])   ? Claudication [facility])   ? Kidney stones   ? Hypertension   ? Coronary atherosclerosis of native coronary artery   ? Depression   ? Anxiety   ? Hypersomnia   ? Dyslipidemia   ? ?PCP:  Seward Carol, MD ?Pharmacy:   ?[id number] [address] AT [facility] [address] ?[place] ?[place] [address] ?Phone: [phone] Fax: [phone] ? ? ? ? ?Social Determinants of Health (SDOH) Interventions ?  ? ?Readmission Risk Interventions ?   ? View : No data to display.  ?  ?  ?  ? ? ? ?

## 2022-04-26 NOTE — Progress Notes (Signed)
PT requesting to not wear Bipap at this time. PT on 3LNC Sat 93%. No resp distress noted. PT aware if he gets SOB or desats that he may need to wear bipap. Will continue to monitor.  ?

## 2022-04-26 NOTE — Progress Notes (Signed)
Initial Nutrition Assessment ? ?DOCUMENTATION CODES:  ? ?Obesity unspecified ? ?INTERVENTION:  ?- Ensure Enlive po TID, each supplement provides 350 kcal and 20 grams of protein. ? ?- MVI with minerals daily ? ?- Recommend SLP evaluation to assess pt's reported swallowing difficulties; reached out to MD with recommendation ? ?NUTRITION DIAGNOSIS:  ? ?Inadequate oral intake related to inability to eat, poor appetite as evidenced by per patient/family report. ? ?GOAL:  ? ?Patient will meet greater than or equal to 90% of their needs ? ?MONITOR:  ? ?PO intake, Supplement acceptance, Diet advancement, Labs, Weight trends ? ?REASON FOR ASSESSMENT:  ? ?Consult ?Assessment of nutrition requirement/status ? ?ASSESSMENT:  ? ?Pt admitted with acute on chronic respiratory failure with hypoxia. PMH significant for COPD, HFrEF (40%), T2DM and CAD s/p CABG. ? 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 Noted plans for MRI brain, IR eval for biopsy of presacral mass and PET scan.  ? ?Pt with wife at bedside. Pt is soft spoken, his wife assisted to provide nutrition and weight history. Pt had his teeth removed around August 2022. Since then he has had difficulty healing and has since received partial plates. Since August, however, he has had significantly decreased appetite as he has been unable to chew foods. More specifically, within the last month he has reported to her just not having an appetite. The last intake pt reports is a milkshake on Wednesday which he typically will buy one and split it into 2 meals. He endorses intermittent inability to swallow foods unrelated to chewing difficulties. PTA, he endorses good mobility and had worked Monday and Tuesday.  ? ?Pt states that prior to August, his usual weight was ~258 lbs. His wife reports a weight loss of 75 lbs but has since gained some back. Reviewed weight history. Pt's weight is elevated from most recent  documented weight. Pt noted to have heart failure which is likely contributing to weight increase.  ? ?Medications: SSI 0-9 units Q4h ? ?Labs: ionized calcium 1.14, HgbA1c 6.5%, CBG 156-255 x24 hours ? ?UOP: 2873m x24 hours ?I/O's: -2273 ml since admission ? ?NUTRITION - FOCUSED PHYSICAL EXAM: ? ?Flowsheet Row Most Recent Value  ?Orbital Region No depletion  ?Upper Arm Region No depletion  ?Thoracic and Lumbar Region No depletion  ?Buccal Region No depletion  ?Temple Region No depletion  ?Clavicle Bone Region No depletion  ?Clavicle and Acromion Bone Region No depletion  ?Scapular Bone Region No depletion  ?Dorsal Hand Mild depletion  ?Patellar Region Mild depletion  ?Anterior Thigh Region No depletion  ?Posterior Calf Region No depletion  ?Edema (RD Assessment) None  ?Hair Reviewed  ?Eyes Reviewed  ?Mouth Reviewed  ?Skin Reviewed  ?Nails Reviewed  ? ?  ? ? ?Diet Order:   ?Diet Order   ? ?       ?  Diet NPO time specified Except for: Sips with Meds  Diet effective midnight       ?  ? ?  ?  ? ?  ? ? ?EDUCATION NEEDS:  ? ?Education needs have been addressed ? ?Skin:  Skin Assessment: Reviewed RN Assessment ? ?Last BM:  5/10 ? ?Height:  ? ?Ht Readings from Last 1 Encounters:  ?04/26/22 '5\' 6"'  (1.676 m)  ? ? ?Weight:  ? ?Wt Readings from Last 1 Encounters:  ?04/26/22 104.2 kg  ? ? ?Ideal Body Weight:  64.5 kg ? ?BMI:  Body mass index is 37.08 kg/m?.Marland Kitchen? ?  Estimated Nutritional Needs:  ? ?Kcal:  1800-2000 ? ?Protein:  90-105g ? ?Fluid:  >/=1.8L ? ?Clayborne Dana, RDN, LDN ?Clinical Nutrition ?

## 2022-04-26 NOTE — Progress Notes (Signed)
Received request for biopsy of presacral mass for [name]. ?Upon review, multiple lesions through the body.  Without PET, unable to determine which lesion he would benefit most from sampling.  Spoke with Dr. Bonner Puna regarding this concern.  Agreed to hold off on any procedure at this time until further workup can be performed.  ? ?Electronically Signed: ?Pasty Spillers, PA-C ?04/26/2022, 10:28 AM ? ? ?

## 2022-04-26 NOTE — Evaluation (Signed)
Occupational Therapy Evaluation Patient Details Name: [name] MRN: [id number] DOB: [birth year] Today's Date: 04/26/2022   History of Present Illness 81 y.o. male adm 5/11 with edema and orthopnea with acute on chronic CHF and COPD. 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 PMhx: COPD on O2, HFrEF, DM2, CAD s/p CABG, depression, macular degeneration, HTN, HLD   Clinical Impression    Pt completes transfers at min assist level with use of the RW for support.  HR maintains in the 120s at rest and with sitting EOB.  It increased into the low 130s with standing and with transfer to the bedside chair.  Increased sharp pain is noted in the lower back and hips with attempted trunk flexion and hip flexion during dressing tasks and with transition from standing to sitting.  Feel he will benefit from acute care OT to address these deficits in order to return home with his spouse at a more independent level.  Recommend HHOT for follow-up.       Recommendations for follow up therapy are one component of a multi-disciplinary discharge planning process, led by the attending physician.  Recommendations may be updated based on patient status, additional functional criteria and insurance authorization.   Follow Up Recommendations  Home health OT    Assistance Recommended at Discharge Frequent or constant Supervision/Assistance  Patient can return home with the following A little help with walking and/or transfers;A little help with bathing/dressing/bathroom;Assistance with cooking/housework;Assist for transportation;Help with stairs or ramp for entrance    Functional Status Assessment  Patient has had a recent decline in their functional status and demonstrates the ability to make significant improvements in function in a reasonable and predictable amount of time.  Equipment Recommendations  BSC/3in1       Precautions / Restrictions  Precautions Precautions: Fall;Other (comment) Precaution Comments: watch HR Restrictions Weight Bearing Restrictions: No      Mobility Bed Mobility Overal bed mobility: Needs Assistance Bed Mobility: Supine to Sit, Sit to Supine, Rolling Rolling: Min assist   Supine to sit: Min assist Sit to supine: Mod assist   General bed mobility comments: Mod assist for lifting BLEs into the bed when laying down.    Transfers Overall transfer level: Needs assistance Equipment used: Rolling walker (2 wheels) Transfers: Sit to/from Stand, Bed to chair/wheelchair/BSC Sit to Stand: Min assist Stand pivot transfers: Min assist   Step pivot transfers: Min assist            Balance Overall balance assessment: Needs assistance Sitting-balance support: No upper extremity supported, Feet supported Sitting balance-Leahy Scale: Fair     Standing balance support: Single extremity supported, Bilateral upper extremity supported, Reliant on assistive device for balance Standing balance-Leahy Scale: Poor Standing balance comment: Pt reliant on walker for UE support when standing.                           ADL either performed or assessed with clinical judgement   ADL Overall ADL's : Needs assistance/impaired Eating/Feeding: Independent;Sitting   Grooming: Wash/dry hands;Wash/dry face;Sitting;Set up   Upper Body Bathing: Supervision/ safety;Sitting   Lower Body Bathing: Minimal assistance;Sit to/from stand   Upper Body Dressing : Supervision/safety;Sitting   Lower Body Dressing: Maximal assistance Lower Body Dressing Details (indicate cue type and reason): for donning gripper socks Toilet Transfer: Minimal assistance;Rolling walker (2 wheels) Toilet Transfer Details (indicate cue type and reason): simulated Toileting- Clothing Manipulation and  Hygiene: Minimal assistance       Functional mobility during ADLs: Minimal assistance (stand pivot to the bedside  recliner) General ADL Comments: Pt in bed with HR in the low 120s.  With transition to sitting it maintained around the upper 120s increasing into the 130s with activity.  He reported sharp pain in his lower back and legs with attempted crossing of his LEs to donn his gripper socks as well as when transitioning from standing to sitting.  Oxygen sats at 94% or greater throughout on 3Ls nasal cannula.     Vision Baseline Vision/History: 1 Wears glasses Ability to See in Adequate Light: 0 Adequate Patient Visual Report: No change from baseline Vision Assessment?: No apparent visual deficits     Perception Perception Perception: Within Functional Limits   Praxis Praxis Praxis: Intact    Pertinent Vitals/Pain Pain Assessment Pain Assessment: Faces Faces Pain Scale: Hurts little more Pain Location: intermittent spasms and sharp pain in back and hips Pain Descriptors / Indicators: Sharp, Stabbing Pain Intervention(s): Monitored during session, Repositioned     Hand Dominance     Extremity/Trunk Assessment Upper Extremity Assessment Upper Extremity Assessment: Generalized weakness   Lower Extremity Assessment Lower Extremity Assessment: Defer to PT evaluation   Cervical / Trunk Assessment Cervical / Trunk Assessment: Kyphotic   Communication Communication Communication: HOH;Expressive difficulties (pt very soft spoken)   Cognition Arousal/Alertness: Awake/alert Behavior During Therapy: WFL for tasks assessed/performed Overall Cognitive Status: Within Functional Limits for tasks assessed                                                  Home Living Family/patient expects to be discharged to:: Private residence Living Arrangements: Spouse/significant other Available Help at Discharge: Family;Available 24 hours/day Type of Home: House Home Access: Stairs to enter [employer] of Steps: 5   Home Layout: Two level;1/2 bath on main level;Bed/bath  upstairs Alternate Level Stairs-Number of Steps: 14   Bathroom Shower/Tub: Producer, television/film/video: Standard     Home Equipment: None   Additional Comments: pt has couch and half bath downstairs. 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      Prior Functioning/Environment Prior Level of Function : Independent/Modified Independent                        OT Problem List: Decreased strength;Decreased activity tolerance;Impaired balance (sitting and/or standing);Pain;Cardiopulmonary status limiting activity;Decreased knowledge of use of DME or AE;Decreased knowledge of precautions      OT Treatment/Interventions: Self-care/ADL training;DME and/or AE instruction;Energy conservation;Therapeutic activities;Balance training;Patient/family education    OT Goals(Current goals can be found in the care plan section) Acute Rehab OT Goals Patient Stated Goal: To quit hurting and get back to normal.  He was working 2 days ago. OT Goal Formulation: With patient Time For Goal Achievement: 05/10/22 Potential to Achieve Goals: Good  OT Frequency: Min 2X/week       AM-PAC OT "6 Clicks" Daily Activity     Outcome Measure Help from another person eating meals?: None Help from another person taking care of personal grooming?: A Little Help from another person toileting, which includes using toliet, bedpan, or urinal?: A Little Help from another person bathing (including washing, rinsing, drying)?: A Little Help from another person to put on and taking  off regular upper body clothing?: A Little Help from another person to put on and taking off regular lower body clothing?: A Lot 6 Click Score: 18   End of Session Nurse Communication: Mobility status  Activity Tolerance: Patient limited by pain Patient left: in bed;with call bell/phone within reach;with family/visitor present  OT Visit Diagnosis: Unsteadiness on feet (R26.81);Muscle weakness (generalized)  (M62.81);Pain Pain - Right/Left: Right Pain - part of body: Hip (lower back)                Time: 1352-1430 OT Time Calculation (min): 38 min Charges:  OT General Charges $OT Visit: 1 Visit OT Evaluation $OT Eval Moderate Complexity: 1 Mod OT Treatments $Self Care/Home Management : 23-37 mins Doralene Glanz OTR/L 04/26/2022, 3:32 PM

## 2022-04-27 ENCOUNTER — Inpatient Hospital Stay (HOSPITAL_COMMUNITY): Payer: Medicare Other

## 2022-04-27 ENCOUNTER — Encounter (HOSPITAL_COMMUNITY): Payer: Medicare Other

## 2022-04-27 DIAGNOSIS — R7989 Other specified abnormal findings of blood chemistry: Secondary | ICD-10-CM

## 2022-04-27 DIAGNOSIS — J9621 Acute and chronic respiratory failure with hypoxia: Secondary | ICD-10-CM | POA: Diagnosis not present

## 2022-04-27 DIAGNOSIS — J441 Chronic obstructive pulmonary disease with (acute) exacerbation: Secondary | ICD-10-CM | POA: Diagnosis not present

## 2022-04-27 DIAGNOSIS — I5023 Acute on chronic systolic (congestive) heart failure: Secondary | ICD-10-CM | POA: Diagnosis not present

## 2022-04-27 DIAGNOSIS — E278 Other specified disorders of adrenal gland: Secondary | ICD-10-CM | POA: Diagnosis not present

## 2022-04-27 LAB — BLOOD GAS, VENOUS
Acid-Base Excess: 9.9 mmol/L — ABNORMAL HIGH (ref 0.0–2.0)
Bicarbonate: 37.2 mmol/L — ABNORMAL HIGH (ref 20.0–28.0)
Drawn by: 3238
O2 Saturation: 61.1 %
Patient temperature: 37
pCO2, Ven: 60 mmHg (ref 44–60)
pH, Ven: 7.4 (ref 7.25–7.43)
pO2, Ven: 38 mmHg (ref 32–45)

## 2022-04-27 LAB — CBC WITH DIFFERENTIAL/PLATELET
Abs Immature Granulocytes: 0.17 10*3/uL — ABNORMAL HIGH (ref 0.00–0.07)
Basophils Absolute: 0 10*3/uL (ref 0.0–0.1)
Basophils Relative: 0 %
Eosinophils Absolute: 0 10*3/uL (ref 0.0–0.5)
Eosinophils Relative: 0 %
HCT: 41.9 % (ref 39.0–52.0)
Hemoglobin: 13.4 g/dL (ref 13.0–17.0)
Immature Granulocytes: 1 %
Lymphocytes Relative: 5 %
Lymphs Abs: 0.9 10*3/uL (ref 0.7–4.0)
MCH: 29.2 pg (ref 26.0–34.0)
MCHC: 32 g/dL (ref 30.0–36.0)
MCV: 91.3 fL (ref 80.0–100.0)
Monocytes Absolute: 2 10*3/uL — ABNORMAL HIGH (ref 0.1–1.0)
Monocytes Relative: 12 %
Neutro Abs: 13.8 10*3/uL — ABNORMAL HIGH (ref 1.7–7.7)
Neutrophils Relative %: 82 %
Platelets: 364 10*3/uL (ref 150–400)
RBC: 4.59 MIL/uL (ref 4.22–5.81)
RDW: 13.3 % (ref 11.5–15.5)
WBC: 16.8 10*3/uL — ABNORMAL HIGH (ref 4.0–10.5)
nRBC: 0 % (ref 0.0–0.2)

## 2022-04-27 LAB — GLUCOSE, CAPILLARY
Glucose-Capillary: 177 mg/dL — ABNORMAL HIGH (ref 70–99)
Glucose-Capillary: 188 mg/dL — ABNORMAL HIGH (ref 70–99)
Glucose-Capillary: 200 mg/dL — ABNORMAL HIGH (ref 70–99)
Glucose-Capillary: 204 mg/dL — ABNORMAL HIGH (ref 70–99)
Glucose-Capillary: 206 mg/dL — ABNORMAL HIGH (ref 70–99)
Glucose-Capillary: 236 mg/dL — ABNORMAL HIGH (ref 70–99)

## 2022-04-27 LAB — BASIC METABOLIC PANEL
Anion gap: 9 (ref 5–15)
BUN: 24 mg/dL — ABNORMAL HIGH (ref 8–23)
CO2: 30 mmol/L (ref 22–32)
Calcium: 9.5 mg/dL (ref 8.9–10.3)
Chloride: 100 mmol/L (ref 98–111)
Creatinine, Ser: 0.74 mg/dL (ref 0.61–1.24)
GFR, Estimated: >60 mL/min (ref >60)
Glucose, Bld: 218 mg/dL — ABNORMAL HIGH (ref 70–99)
Potassium: 3.7 mmol/L (ref 3.5–5.1)
Sodium: 139 mmol/L (ref 135–145)

## 2022-04-27 LAB — MAGNESIUM: Magnesium: 2.2 mg/dL (ref 1.7–2.4)

## 2022-04-27 LAB — TSH: TSH: 0.617 u[IU]/mL (ref 0.350–4.500)

## 2022-04-27 MED ORDER — POTASSIUM CHLORIDE CRYS ER 20 MEQ PO TBCR
20.0000 meq | EXTENDED_RELEASE_TABLET | Freq: Once | ORAL | Status: AC
Start: 2022-04-27 — End: 2022-04-27
  Administered 2022-04-27: 20 meq via ORAL
  Filled 2022-04-27: qty 1

## 2022-04-27 MED ORDER — INSULIN ASPART 100 UNIT/ML IJ SOLN
0.0000 [IU] | Freq: Every day | INTRAMUSCULAR | Status: DC
  Administered 2022-04-27: 2 [IU] via SUBCUTANEOUS

## 2022-04-27 MED ORDER — METOPROLOL TARTRATE 50 MG PO TABS
50.0000 mg | ORAL_TABLET | Freq: Once | ORAL | Status: AC
  Administered 2022-04-27: 50 mg via ORAL
  Filled 2022-04-27: qty 1

## 2022-04-27 MED ORDER — LORAZEPAM 2 MG/ML IJ SOLN
0.5000 mg | Freq: Once | INTRAMUSCULAR | Status: AC | PRN
  Administered 2022-05-01: 0.5 mg via INTRAVENOUS
  Filled 2022-04-27: qty 1

## 2022-04-27 MED ORDER — METOPROLOL SUCCINATE ER 50 MG PO TB24
50.0000 mg | ORAL_TABLET | Freq: Two times a day (BID) | ORAL | Status: DC
  Administered 2022-04-27 – 2022-04-29 (×3): 50 mg via ORAL
  Filled 2022-04-27 (×3): qty 1

## 2022-04-27 MED ORDER — IOHEXOL 350 MG/ML SOLN
75.0000 mL | Freq: Once | INTRAVENOUS | Status: AC | PRN
  Administered 2022-04-27: 75 mL via INTRAVENOUS

## 2022-04-27 MED ORDER — SODIUM CHLORIDE 0.9 % IV SOLN
2.0000 g | INTRAVENOUS | Status: DC
  Administered 2022-04-27: 2 g via INTRAVENOUS
  Filled 2022-04-27 (×2): qty 20

## 2022-04-27 MED ORDER — AZITHROMYCIN 250 MG PO TABS
500.0000 mg | ORAL_TABLET | Freq: Every day | ORAL | Status: DC
  Administered 2022-04-27: 500 mg via ORAL
  Filled 2022-04-27: qty 2

## 2022-04-27 MED ORDER — FUROSEMIDE 10 MG/ML IJ SOLN
40.0000 mg | Freq: Once | INTRAMUSCULAR | Status: AC
  Administered 2022-04-27: 40 mg via INTRAVENOUS
  Filled 2022-04-27: qty 4

## 2022-04-27 MED ORDER — INSULIN ASPART 100 UNIT/ML IJ SOLN
0.0000 [IU] | Freq: Three times a day (TID) | INTRAMUSCULAR | Status: DC
  Administered 2022-04-27: 3 [IU] via SUBCUTANEOUS
  Administered 2022-04-28: 5 [IU] via SUBCUTANEOUS
  Administered 2022-04-28: 2 [IU] via SUBCUTANEOUS
  Administered 2022-04-28 – 2022-04-30 (×4): 3 [IU] via SUBCUTANEOUS
  Administered 2022-04-30: 2 [IU] via SUBCUTANEOUS
  Administered 2022-04-30: 3 [IU] via SUBCUTANEOUS
  Administered 2022-05-01: 5 [IU] via SUBCUTANEOUS
  Administered 2022-05-01 (×2): 3 [IU] via SUBCUTANEOUS
  Administered 2022-05-02: 2 [IU] via SUBCUTANEOUS
  Administered 2022-05-02 – 2022-05-03 (×5): 3 [IU] via SUBCUTANEOUS
  Administered 2022-05-04 – 2022-05-09 (×11): 2 [IU] via SUBCUTANEOUS

## 2022-04-27 NOTE — Plan of Care (Signed)

## 2022-04-27 NOTE — Progress Notes (Signed)
?Progress Note ? ?Patient: [name]:[id number] DOB: 1941/03/27  ?DOA: 04/25/2022  DOS: 04/27/2022  ?  ?Brief hospital course: ?[name] is a 81 y.o. 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 This is in setting of long distance travel for work, and d-dimer is elevated prompting V/Q scan. Further details below. ? ?Assessment and Plan: ?Acute on chronic hypoxic respiratory failure: Presumably due to acute CHF and COPD exacerbation, CT w/pulmonary parenchyma shows emphysema with peribronchial thickening, minimal atelectasis and no effusion/infiltrate/PTX. No wheezes on exam. CTA chest without PE. There is evidence of atelectasis, pneumonia, and interstitial edema.  ?- D-dimer elevated: Check LE venous U/S   ?- Weaned from BiPAP to supplemental oxygen, improved but not at baseline. Still higher oxygen supplementation needed, dyspneic. Not hypercarbic by recheck VBG this AM.  ?- Awaiting MRI brain to delineate abnormality noted on CT head as ICH not ruled out.  ?- Continue baseline COPD medications and prn albuterol. May begin steroids if not able to continue oxygen weaning efforts, no wheezing currently. ? ?Severe back pain: Relatively abrupt onset, radiates up and downward, after long distance driving. +Paraspinal spasm on exam without step offs or midline point tenderness. No myelopathy/radiculopathy. History of symptomatic ureterolithiasis, though no obstructing stone noted on CT and no RBCs in UA. Nonspecific mild perinephric fat plan stranding but no pyuria, negative culture. CT C/A/P not dedicated to spine, though no acute osseous findings per radiology and my personal interpretation.  ?- Treat empirically for lumbar paraspinal spasm with tylenol, robaxin, heating pad. Can continue prn oxycodone and/or morphine.  ?- Could consider NSAID, but with diuresis and contrast,  would not want to pile onto kidneys more at this time. ?- Further evaluation to be guided by clinical course. ? ?Acute on chronic HFiEF: LVEF previously 45-50%, based on more recent echo LVEF has improved to 50-55%. BNP elevated to 540 and interstitial edema noted.  ?- Give lasix '40mg'$  IV x1 and monitor I/O, weight response.  ?- LE edema actually improved from prior, but orthopnea worse.  ?- Continue metoprolol, increasing dose to reduce myocardial demand. ? ?Bacteremia vs. contaminant: Strep spp in 1 of 4 blood cultures from admission. Not a typical pathogenic strain by rapid ID. Suspect contaminant. ? ?CAD s/p CABG: Troponin negative at 14. Nonischemic ECG ?- Continue aspirin, metoprolol, statin ? ?Presacral mass: Indeterminate, incidental. Not rim-enhancing. ?- After discussion with IR, recommendation is to perform a PET-CT prior to selecting a target.  ? 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 ?- Urine and plasma metanephrines, renin and aldosterone pending ?- Cortisol is 49.2 ?- Needs PET to delineate this as well as presacral density on CT.  ? ?Neck mass: CT showed lipoma, which is consistent with palpation on exam.  ? ?Aphonia, history of dysphagia: Unclear etiology in this case.  ?- MRI brain ordered. May need laryngoscopy or FEES if persists. Esophagram Feb 2022 showed mild dysmotility without stricture or mass.  ? ?Tremor: Nonspecific, relatively acute. As I witnessed, not consistent with seizure, not contemporaneous to hypoglycemia.   ?- MRI brain pending. Pt thus far declining this.  ? ?NIDT2DM: HbA1c 6.5%.  ?- Continue SSI, augment to moderate scale, hold metformin with contrast exposures.  ? ?Multifocal pneumonia: With leukocytosis, respiratory decompensation, and increasing consolidations on CTA today.  ?- Restart ceftriaxone and azithromycin. ?- Monitor blood  cultures drawn at admission  ? ?Obesity: Estimated body mass index is 36.69 kg/m? as calculated from the following: ?  Height as of this encounter:  '5\' 6"'$  (1.676 m). ?  Weight as of this encounter: 103.1 kg. ? ?Subjective: Had a better night, pain better controlled. He's relaxed this morning, still very hoarse. No chest pain or palpitations. Lower back pain improved significantly. +dyspnea. Doesn't want to get MRI done. Still has tremor.  ? ?Objective: ?Vitals:  ? 04/27/22 0817 04/27/22 0939 04/27/22 1142 04/27/22 1144  ?BP: (!) 168/91  (!) 144/86   ?Pulse: (!) 121  (!) 119   ?Resp: (!) 24  20   ?Temp: 97.8 ?F (36.6 ?C)  98.1 ?F (36.7 ?C)   ?TempSrc: Oral  Oral   ?SpO2: 92% 93% 90% 90%  ?Weight:      ?Height:      ? ?Gen: 81 y.o. male in no distress ?Pulm: Nonlabored tachypnea at rest with 4L O2, no wheezes. Diminished significantly at bases. ?CV: Regular tachycardia without murmur, rub, or gallop. No JVD, stable mild dependent edema. ?GI: Abdomen soft, non-tender, non-distended, with normoactive bowel sounds.  ?Ext: Warm, no deformities ?Skin: No new rashes, lesions or ulcers on visualized skin. ?Neuro: Drowsy but rousable this AM. Strength and sensation intact throughout. Has intention tremor in all 4 extremities UE > LE.  ?Psych: Judgement and insight appear fair. Mood anxious & affect congruent. Behavior is appropriate.   ? ?Data Personally reviewed: ?CBC: ?Recent Labs  ?Lab 04/25/22 ?1104 04/25/22 ?1706 04/27/22 ?0412  ?WBC 16.0*  --  16.8*  ?NEUTROABS  --   --  13.8*  ?HGB 14.0 13.9 13.4  ?HCT 42.3 41.0 41.9  ?MCV 89.4  --  91.3  ?PLT 282  --  364  ? ?Basic Metabolic Panel: ?Recent Labs  ?Lab 04/25/22 ?1104 04/25/22 ?1706 04/26/22 ?0245 04/27/22 ?0412  ?NA 134* 133* 135 139  ?K 3.5 3.1* 3.6 3.7  ?CL 96*  --  94* 100  ?CO2 26  --  29 30  ?GLUCOSE 196*  --  143* 218*  ?BUN 20  --  17 24*  ?CREATININE 1.02  --  0.91 0.74  ?CALCIUM 9.2  --  8.8* 9.5  ?MG  --   --  2.1 2.2  ? ?GFR: ?Estimated Creatinine Clearance: 85.6 mL/min (by C-G formula based on SCr of 0.74 mg/dL). ? ?HbA1C: ?Recent Labs  ?  04/26/22 ?0245  ?HGBA1C 6.5*  ? ?CBG: ?Recent Labs  ?Lab  04/26/22 ?2006 04/27/22 ?0017 04/27/22 ?7591 04/27/22 ?6384 04/27/22 ?1141  ?GLUCAP 196* 188* 206* 200* 236*  ? ?Urine analysis: ?   ?Component Value Date/Time  ? COLORURINE YELLOW 04/25/2022 1317  ? APPEARANCEUR CLEAR 04/25/2022 1317  ? LABSPEC 1.020 04/25/2022 1317  ? PHURINE 5.0 04/25/2022 1317  ? GLUCOSEU 100 (A) 04/25/2022 1317  ? HGBUR TRACE (A) 04/25/2022 1317  ? BILIRUBINUR SMALL (A) 04/25/2022 1317  ? KETONESUR 40 (A) 04/25/2022 1317  ? PROTEINUR 100 (A) 04/25/2022 1317  ? NITRITE NEGATIVE 04/25/2022 1317  ? LEUKOCYTESUR NEGATIVE 04/25/2022 1317  ? ?Recent Results (from the past 240 hour(s))  ?Resp Panel by RT-PCR (Flu A&B, Covid) Nasopharyngeal Swab     Status: None  ? Collection Time: 04/25/22 11:04 AM  ? Specimen: Nasopharyngeal Swab; Nasopharyngeal(NP) swabs in vial transport medium  ?Result Value Ref Range Status  ? SARS Coronavirus 2 by RT PCR NEGATIVE NEGATIVE Final  ?  Comment: (NOTE) ?SARS-CoV-2 target nucleic acids are NOT DETECTED. ? ?The SARS-CoV-2  RNA is generally detectable in upper respiratory ?specimens during the acute phase of infection. The lowest ?concentration of SARS-CoV-2 viral copies this assay can detect is ?138 copies/mL. A negative result does not preclude SARS-Cov-2 ?infection and should not be used as the sole basis for treatment or ?other patient management decisions. A negative result may occur with  ?improper specimen collection/handling, submission of specimen other ?than nasopharyngeal swab, presence of viral mutation(s) within the ?areas targeted by this assay, and inadequate number of viral ?copies(<138 copies/mL). A negative result must be combined with ?clinical observations, patient history, and epidemiological ?information. The expected result is Negative. ? ?Fact Sheet for Patients:  ?EntrepreneurPulse.com.au ? ?Fact Sheet for Healthcare Providers:  ?IncredibleEmployment.be ? ?This test is no t yet approved or cleared by the [place] FDA and  ?has been authorized for detection and/or diagnosis of SARS-CoV-2 by ?FDA under an Emergency Use Authorization (EUA). This EUA will remain  ?in effect (meaning this test can be used) for the d

## 2022-04-27 NOTE — Progress Notes (Signed)
BLE venous duplex has been completed. ? ? ?Results can be found under chart review under CV PROC. ?04/27/2022 4:04 PM ?Natayah Warmack RVT, RDMS ? ?

## 2022-04-27 NOTE — Progress Notes (Signed)
Speech Language Pathology Treatment: Dysphagia  ?Patient Details ?Name: [name] ?MRN: [id number] ?DOB: 06/25/1941 ?Today's Date: 04/27/2022 ?Time: 8828-0034 ?SLP Time Calculation (min) (ACUTE ONLY): 18 min ? ?Assessment / Plan / Recommendation ?Clinical Impression ? Pt seen for ongoing dysphagia management.  With initial sip of water there was immediate cough with perceptually wet vocal quality as compared to volitional cough prior to PO trials.  Pt was surprised that he was given water instead of Ensure and displeased with taste.  There were no further clinical s/s of additional PO trials of thin liquid Ensure by straw.  Clinical presentation with thin liquids today is reassuring as compared to clinical swallow evaluation on 5/12.  Chest CT 5/11 with no acute infiltrate.  Pt tolerated puree and mechanical soft textures without clinical s/s of aspiration and exhibited good oral clearance.  Pt declined regular solid trial 2/2 absence of dentures. Discussed diet preference with wife who would like to continue regular texture diet choosing softer foods and cutting food into smaller pieces as needed.  Pt has not had much of an appetite and has been consuming Ensure in lieu of solid foods.   ? ?Recommend continuing regular texture diet with thin liquids.  ? ?Of note: Pt continues to present with aphonia.  Pt and wife report that this happened on Thursday with very sudden onset.  Vocal cords are seemingly approximating appropriately for both volitional and reflexive cough, but pt is unable to phonate for speech.  Consider ENT consult, if indicate.  SLP will be available to perform FEES if indicate for dysphagia, as well.  ?  ?HPI HPI: 81 y.o. male adm 5/11 with edema and orthopnea with acute on chronic CHF and COPD. 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 PMhx: COPD on O2, HFrEF, DM2, CAD s/p CABG, depression, macular degeneration, HTN, HLD ?  ?   ?SLP Plan ? Continue  with current plan of care ? ?  ?  ?Recommendations for follow up therapy are one component of a multi-disciplinary discharge planning process, led by the attending physician.  Recommendations may be updated based on patient status, additional functional criteria and insurance authorization. ?  ? ?Recommendations  ?Diet recommendations: Regular;Thin liquid ?Liquids provided via: Straw ?Medication Administration: Whole meds with liquid ?Compensations: Slow rate;Small sips/bites ?Postural Changes and/or Swallow Maneuvers: Seated upright 90 degrees  ?   ?    ?   ? ? ? ? Oral Care Recommendations: Oral care BID ?Follow Up Recommendations:  (TBD) ?Assistance recommended at discharge: Intermittent Supervision/Assistance ?SLP Visit Diagnosis: Dysphagia, unspecified (R13.10) ?Plan: Continue with current plan of care ? ? ? ? ?  ?  ? ? ?Skylen Danielsen E Cordaro Mukai, MA, CCC-SLP ?Acute Rehabilitation Services ?Office: [phone] ?04/27/2022, 10:58 AM ?

## 2022-04-27 NOTE — Progress Notes (Signed)
Pt brought to MRI for exam. Prepared pt for exam and began scanning. Pt struggled to remain still. Able to obtain motion degraded AX DWI, AX T2 FLAIR and a fully diagnostic COR DWI before pt began ripping off head coil required for imaging and attempting to climb out of the scanner. Scan aborted due to pt safety and pt safely removed from scanner. Called RN in hopes of potential meds but no meds available at this time. Unsafe to continue scanning pt in current condition. Exam completed as a limited study. ?

## 2022-04-28 ENCOUNTER — Inpatient Hospital Stay (HOSPITAL_COMMUNITY): Payer: Medicare Other

## 2022-04-28 DIAGNOSIS — J9601 Acute respiratory failure with hypoxia: Secondary | ICD-10-CM

## 2022-04-28 DIAGNOSIS — I5023 Acute on chronic systolic (congestive) heart failure: Secondary | ICD-10-CM | POA: Diagnosis not present

## 2022-04-28 DIAGNOSIS — R000 Tachycardia, unspecified: Secondary | ICD-10-CM | POA: Diagnosis not present

## 2022-04-28 DIAGNOSIS — J441 Chronic obstructive pulmonary disease with (acute) exacerbation: Secondary | ICD-10-CM | POA: Diagnosis not present

## 2022-04-28 DIAGNOSIS — R7989 Other specified abnormal findings of blood chemistry: Secondary | ICD-10-CM | POA: Diagnosis not present

## 2022-04-28 DIAGNOSIS — I10 Essential (primary) hypertension: Secondary | ICD-10-CM

## 2022-04-28 DIAGNOSIS — E278 Other specified disorders of adrenal gland: Secondary | ICD-10-CM | POA: Diagnosis not present

## 2022-04-28 DIAGNOSIS — J9621 Acute and chronic respiratory failure with hypoxia: Secondary | ICD-10-CM | POA: Diagnosis not present

## 2022-04-28 LAB — BLOOD GAS, ARTERIAL
Acid-Base Excess: 11.6 mmol/L — ABNORMAL HIGH (ref 0.0–2.0)
Bicarbonate: 35.9 mmol/L — ABNORMAL HIGH (ref 20.0–28.0)
Drawn by: 40415
O2 Saturation: 95 %
Patient temperature: 36.8
pCO2 arterial: 44 mmHg (ref 32–48)
pH, Arterial: 7.52 — ABNORMAL HIGH (ref 7.35–7.45)
pO2, Arterial: 63 mmHg — ABNORMAL LOW (ref 83–108)

## 2022-04-28 LAB — CBC
HCT: 47.6 % (ref 39.0–52.0)
Hemoglobin: 15.1 g/dL (ref 13.0–17.0)
MCH: 29 pg (ref 26.0–34.0)
MCHC: 31.7 g/dL (ref 30.0–36.0)
MCV: 91.4 fL (ref 80.0–100.0)
Platelets: 412 10*3/uL — ABNORMAL HIGH (ref 150–400)
RBC: 5.21 MIL/uL (ref 4.22–5.81)
RDW: 13.2 % (ref 11.5–15.5)
WBC: 18.6 10*3/uL — ABNORMAL HIGH (ref 4.0–10.5)
nRBC: 0 % (ref 0.0–0.2)

## 2022-04-28 LAB — GLUCOSE, CAPILLARY
Glucose-Capillary: 219 mg/dL — ABNORMAL HIGH (ref 70–99)
Glucose-Capillary: 229 mg/dL — ABNORMAL HIGH (ref 70–99)
Glucose-Capillary: 129 mg/dL — ABNORMAL HIGH (ref 70–99)
Glucose-Capillary: 169 mg/dL — ABNORMAL HIGH (ref 70–99)
Glucose-Capillary: 112 mg/dL — ABNORMAL HIGH (ref 70–99)

## 2022-04-28 LAB — CSF CELL COUNT WITH DIFFERENTIAL
Eosinophils, CSF: 0 % (ref 0–1)
Eosinophils, CSF: 1 % (ref 0–1)
Lymphs, CSF: 8 % — ABNORMAL LOW (ref 40–80)
Lymphs, CSF: 9 % — ABNORMAL LOW (ref 40–80)
Monocyte-Macrophage-Spinal Fluid: 4 % — ABNORMAL LOW (ref 15–45)
Monocyte-Macrophage-Spinal Fluid: 7 % — ABNORMAL LOW (ref 15–45)
RBC Count, CSF: 78 /mm3 — ABNORMAL HIGH
RBC Count, CSF: 90 /mm3 — ABNORMAL HIGH
Segmented Neutrophils-CSF: 84 % — ABNORMAL HIGH (ref 0–6)
Segmented Neutrophils-CSF: 87 % — ABNORMAL HIGH (ref 0–6)
Tube #: 1
Tube #: 4
WBC, CSF: 1370 /mm3 (ref 0–5)
WBC, CSF: 340 /mm3 (ref 0–5)

## 2022-04-28 LAB — BASIC METABOLIC PANEL
Anion gap: 12 (ref 5–15)
BUN: 27 mg/dL — ABNORMAL HIGH (ref 8–23)
CO2: 30 mmol/L (ref 22–32)
Calcium: 9.7 mg/dL (ref 8.9–10.3)
Chloride: 99 mmol/L (ref 98–111)
Creatinine, Ser: 0.92 mg/dL (ref 0.61–1.24)
GFR, Estimated: >60 mL/min (ref >60)
Glucose, Bld: 213 mg/dL — ABNORMAL HIGH (ref 70–99)
Potassium: 3.7 mmol/L (ref 3.5–5.1)
Sodium: 141 mmol/L (ref 135–145)

## 2022-04-28 LAB — TROPONIN I (HIGH SENSITIVITY)
Troponin I (High Sensitivity): 50 ng/L — ABNORMAL HIGH (ref <18)
Troponin I (High Sensitivity): 56 ng/L — ABNORMAL HIGH (ref <18)

## 2022-04-28 LAB — HEPATIC FUNCTION PANEL
ALT: 12 U/L (ref 0–44)
AST: 11 U/L — ABNORMAL LOW (ref 15–41)
Albumin: 2.4 g/dL — ABNORMAL LOW (ref 3.5–5.0)
Alkaline Phosphatase: 103 U/L (ref 38–126)
Bilirubin, Direct: <0.1 mg/dL (ref 0.0–0.2)
Total Bilirubin: 0.7 mg/dL (ref 0.3–1.2)
Total Protein: 6.4 g/dL — ABNORMAL LOW (ref 6.5–8.1)

## 2022-04-28 LAB — CULTURE, BLOOD (ROUTINE X 2): Special Requests: ADEQUATE

## 2022-04-28 LAB — PROTEIN AND GLUCOSE, CSF
Glucose, CSF: <20 mg/dL — CL (ref 40–70)
Total  Protein, CSF: >600 mg/dL — ABNORMAL HIGH (ref 15–45)

## 2022-04-28 LAB — MAGNESIUM: Magnesium: 2.2 mg/dL (ref 1.7–2.4)

## 2022-04-28 MED ORDER — METOPROLOL TARTRATE 5 MG/5ML IV SOLN
5.0000 mg | INTRAVENOUS | Status: DC | PRN
  Administered 2022-04-28: 5 mg via INTRAVENOUS
  Filled 2022-04-28: qty 5

## 2022-04-28 MED ORDER — SODIUM CHLORIDE 0.9 % IV SOLN: 1.0000 g | Freq: Four times a day (QID) | INTRAVENOUS | Status: DC

## 2022-04-28 MED ORDER — METOPROLOL TARTRATE 5 MG/5ML IV SOLN: 5.0000 mg | INTRAVENOUS | Status: DC | PRN

## 2022-04-28 MED ORDER — IOHEXOL 350 MG/ML SOLN
80.0000 mL | Freq: Once | INTRAVENOUS | Status: AC | PRN
  Administered 2022-04-28: 80 mL via INTRAVENOUS

## 2022-04-28 MED ORDER — FUROSEMIDE 10 MG/ML IJ SOLN
40.0000 mg | Freq: Once | INTRAMUSCULAR | Status: AC
  Administered 2022-04-28: 40 mg via INTRAVENOUS
  Filled 2022-04-28: qty 4

## 2022-04-28 MED ORDER — SODIUM CHLORIDE 0.9 % IV SOLN
2.0000 g | Freq: Two times a day (BID) | INTRAVENOUS | Status: DC
  Administered 2022-04-28 – 2022-05-09 (×23): 2 g via INTRAVENOUS
  Filled 2022-04-28 (×24): qty 20

## 2022-04-28 MED ORDER — LABETALOL HCL 5 MG/ML IV SOLN: 5.0000 mg | INTRAVENOUS | Status: DC | PRN

## 2022-04-28 MED ORDER — VANCOMYCIN HCL 1250 MG/250ML IV SOLN
1250.0000 mg | Freq: Two times a day (BID) | INTRAVENOUS | Status: DC
Start: 2022-04-28 — End: 2022-04-29
  Administered 2022-04-28 – 2022-04-29 (×2): 1250 mg via INTRAVENOUS
  Filled 2022-04-28 (×4): qty 250

## 2022-04-28 MED ORDER — METOPROLOL TARTRATE 5 MG/5ML IV SOLN
5.0000 mg | Freq: Four times a day (QID) | INTRAVENOUS | Status: DC | PRN
  Administered 2022-04-28: 5 mg via INTRAVENOUS
  Filled 2022-04-28 (×2): qty 5

## 2022-04-28 MED ORDER — SODIUM CHLORIDE 0.9 % IV SOLN
2.0000 g | INTRAVENOUS | Status: DC
  Administered 2022-04-28 – 2022-04-29 (×9): 2 g via INTRAVENOUS
  Filled 2022-04-28 (×13): qty 2000

## 2022-04-28 MED ORDER — VANCOMYCIN HCL 2000 MG/400ML IV SOLN
2000.0000 mg | Freq: Once | INTRAVENOUS | Status: AC
  Administered 2022-04-28: 2000 mg via INTRAVENOUS
  Filled 2022-04-28: qty 400

## 2022-04-28 MED ORDER — ASPIRIN EC 81 MG PO TBEC
81.0000 mg | DELAYED_RELEASE_TABLET | Freq: Every day | ORAL | Status: DC
  Administered 2022-04-29: 81 mg via ORAL
  Filled 2022-04-28: qty 1

## 2022-04-28 MED ORDER — HEPARIN SODIUM (PORCINE) 5000 UNIT/ML IJ SOLN
5000.0000 [IU] | Freq: Three times a day (TID) | INTRAMUSCULAR | Status: DC
  Administered 2022-04-29 – 2022-05-09 (×27): 5000 [IU] via SUBCUTANEOUS
  Filled 2022-04-28 (×28): qty 1

## 2022-04-28 NOTE — Consult Note (Signed)
?Cardiology Consultation:  ? ?Patient ID: [name] ?MRN: [id number]; DOB: 1941-08-05 ? ?Admit date: 04/25/2022 ?Date of Consult: 04/28/2022 ? ?PCP:  Seward Carol, MD ?  ?[facility] ?Cardiologist:  Fransico Him, MD      ? ? ?Patient Profile:  ? ?[name] is a 81 y.o. 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 Bonner Puna. ? ?History of Present Illness:  ? ?Mr. Reeves was last examined by Dr. Radford Pax in 01/2022 and reported having chronic dyspnea on exertion but denied any recent chest pain or palpitations. He was continued on his current cardiac medications including Amlodipine 10 mg daily, Lasix 40 mg 3 times weekly, ASA 81 mg daily and Toprol-XL 25 mg daily with Simvastatin being transitioned to Atorvastatin. He did have a repeat echocardiogram which showed an EF of 50 to 55% with basal inferior/inferior septal hypokinesis which was overall similar to prior imaging. There was concern for a small PFO and a bubble study was performed for further evaluation and negative. ? ?He presented to [facility] on [facility]25[facility]20[facility] for evaluation of worsening chest discomfort, congestion, fevers and intermittent confusion for the past several days. Had not been taking Lasix routinely due to traveling. Reported having progressive dyspnea on exertion and orthopnea as well over the past week. Initial labs showed WBC 16.0, Hgb 14.0, platelets 282, Na+ 134, K+ 3.5 and creatinine 1.02. BNP 540. Initial Hs Troponin 18 with repeat of 15. Lactic Acid 1.6. EKG shows sinus vs. atrial tachycardia, HR 129 with LVH and associated repol abnormalities. Negative for COVID and Influenza. Blood cultures obtained and have resulted positive for streptococcus anginosis but only in 1 bottle so concerning  for contaminant. CXR showed cardiomegaly and linear densities in both lung fields suggesting atelectasis or pneumonia. 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 Was also noted to have a right adrenal mass concerning for possible malignancy or metastasis. CT Head showed a 2 mm hyperdense focus in the right parietal periventricular white matter which was indeterminate for metastasis or hemorrhage. ? ?He was transferred to [facility] for further management of acute hypoxic respiratory failure and required BiPAP at the time of admission. CTA was obtained upon admission and showed no evidence of an acute PE. Was started on IV Lasix 40 mg daily for possible CHF exacerbation due to missed doses of Lasix as an outpatient. An MRI Brain was obtained yesterday due to his abnormal Head CT and limited due to the patient's motion but did show abnormal small volume diffusion layering within the occipital horns as well as the left sylvian fissure and suspicious for possible small volume subarachnoid hemorrhage or possible meningitis. Neurology was consulted and recommended empirically covering for meningitis with Vancomycin, Ceftriaxone and Ampicillin with possible LP or repeat Brain MRI.  ? ?Overnight, he had oxygen desaturations and required HFNC and ultimately BiPAP. Repeat EKG showed a computer-generated read of STEMI and Dr. Burt Knack was called by the overnight provider and EKG was not felt to be consistent with a STEMI. Was not started on anticoagulation given concern for possible SAH. By review of telemetry, he was tachycardiac with HR in the 170's overnight and most consistent with sinus tachycardia vs. atrial tachycardia. Rhythm  appears regular throughout.  ? ? ?Past Medical History:  ?Diagnosis Date  ? Anxiety   ? CAD (coronary artery disease)   ?  s/p CABG with LIMA to LAD, SVG to diag, SVG to OM, SVG to RCA cath 2002 Dr. Radford Pax  ? CAP (community acquired  pneumonia) due to Pneumococcus ([place]) 03/03/2017  ? Cataract   ? Chronic combined systolic and diastolic CHF (congestive heart failure) ([place]) 04/28/2018  ? Chronic kidney disease   ? Claudication [facility])   ? normal ABIs  ? COPD (chronic obstructive pulmonary disease) ([place])   ? Depression   ? Diabetes mellitus without complication ([place])   ? Dyslipidemia   ? Hypersomnia   ? Hypertension   ? 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[place])   ? EF 40% with apical AK at cath intolerant to ACE I and ARBS, EF 43% by nuclear stress test 08/2010  ? Kidney stones   ? Macular degeneration   ? Morbid obesity ([place]) 04/28/2018  ? ? ?Past Surgical History:  ?Procedure Laterality Date  ? CORONARY ARTERY BYPASS GRAFT    ? with LIMA to LAD, SVG to diag, SVG to OM, SVG to RCA cath 2002 Dr. Radford Pax  ? urologic procedure for fertility    ?  ? ?Home Medications:  ?Prior to Admission medications   ?Medication Sig Start Date End Date Taking? Authorizing Provider  ?acetaminophen (TYLENOL) 500 MG tablet Take 100 mg by mouth every 8 (eight) hours as needed for mild pain or fever.   Yes [provider]  ?albuterol (PROVENTIL HFA;VENTOLIN HFA) 108 (90 Base) MCG/ACT inhaler Inhale 1-2 puffs into the lungs every 6 (six) hours as needed for wheezing or shortness of breath. ?Patient taking differently: Inhale 2 puffs into the lungs every 4 (four) hours as needed for wheezing or shortness of breath. 03/05/17  Yes Reyne Dumas, MD  ?amLODipine (NORVASC) 10 MG tablet Take 10 mg by mouth daily.   Yes [provider]  ?atorvastatin (LIPITOR) 40 MG tablet Take 1 tablet (40 mg total) by mouth daily. 01/22/22  Yes Turner, Eber Hong, MD  ?carbamide peroxide (DEBROX) 6.5 % OTIC solution Place 5 drops into both ears daily as needed (wax buildup).   Yes [provider]  ?dorzolamide (TRUSOPT) 2 % ophthalmic solution Place 1 drop into both eyes 2 (two) times daily.  01/24/20  Yes [provider]  ?furosemide (LASIX) 40 MG tablet Take 40-80 mg by  mouth daily as needed for edema or fluid. 03/11/18  Yes [provider]  ?Multiple Vitamin (MULTIVITAMIN) capsule Take 1 capsule by mouth daily.   Yes [provider]  ?metoprolol succinate (TOPROL-XL) 25 MG 24 hr tablet TAKE 1 TABLET BY MOUTH ONCE DAILY ?Patient taking differently: Take 25 mg by mouth daily. 05/05/17   Nahser, Wonda Cheng, MD  ? ? ?Inpatient Medications: ?Scheduled Meds: ? Chlorhexidine Gluconate Cloth  6 each Topical Daily  ? dorzolamide  1 drop Both Eyes BID  ? feeding supplement  237 mL Oral TID BM  ? furosemide  40 mg Intravenous Once  ? insulin aspart  0-15 Units Subcutaneous TID WC  ? insulin aspart  0-5 Units Subcutaneous QHS  ? methocarbamol  500 mg Oral TID  ? metoprolol succinate  50 mg Oral BID  ? mometasone-formoterol  2 puff Inhalation BID  ? multivitamin with minerals  1 tablet Oral Daily  ? sodium chloride flush  3 mL Intravenous Q12H  ? umeclidinium bromide  1 puff Inhalation Daily  ? ?Continuous Infusions: ?  sodium chloride 500 mL (04/26/22 0006)  ? ampicillin (OMNIPEN) IV    ? cefTRIAXone (ROCEPHIN)  IV    ? vancomycin    ? vancomycin    ? ?PRN Meds: ?sodium chloride, acetaminophen, labetalol, levalbuterol, LORazepam, morphine injection, ondansetron (ZOFRAN) IV, oxyCODONE-acetaminophen, sodium chloride flush ? ?Allergies:   No Known Allergies ? ?Social History:   ?Social History  ? ?Socioeconomic History  ? Marital status: Married  ?  Spouse name: Not on file  ? Number of children: Not on file  ? Years of education: Not on file  ? Highest education level: Not on file  ?Occupational History  ? Not on file  ?Tobacco Use  ? Smoking status: Former  ?  Types: Cigarettes  ?  Quit date: 10/25/2001  ?  Years since quitting: 20.5  ? Smokeless tobacco: Never  ?Vaping Use  ? Vaping Use: Never used  ?Substance and Sexual Activity  ? Alcohol use: Yes  ?  Comment: occasional beer and wine  ? Drug use: No  ? Sexual activity: Not on file  ?Other Topics Concern  ? Not on file  ?Social  History Narrative  ? Not on file  ? ?Social Determinants of Health  ? ?Financial Resource Strain: Not on file  ?Food Insecurity: Not on file  ?Transportation Needs: Not on file  ?Physical Activity: Not on file

## 2022-04-28 NOTE — Progress Notes (Signed)
Overnight progress note ? ?Notified by RN that patient tachycardic and complaining of chest tightness.  He desatted to 70s on 6 L supplemental oxygen, placed on HFNC salter at 15 L with sats remaining in the 80s, subsequently placed on BiPAP and sats improved. ? ?Chart reviewed, patient with history of COPD on intermittent 2 L oxygen, chronic diastolic CHF, CAD status post CABG admitted for acute on chronic hypoxic respiratory failure secondary to acute CHF and COPD exacerbation.  CTA chest done yesterday negative for PE.  Also CT head done at the time of admission was showing a 2 mm hyperdense focus in the right parietal periventricular white matter, indeterminate for a tiny metastasis, focus of calcification, punctate hemorrhage, or vascular structure.  MRI was ordered for further evaluation. ? ?MRI resulted this morning and showing: ?"IMPRESSION: ?1. Technically limited exam due to patient's inability to tolerate ?the full length of the study and extensive motion artifact. ?2. Abnormal small volume diffusion signal abnormality layering ?within the occipital horns of both lateral ventricles as well as the ?left Sylvian fissure. Findings are nonspecific, but suspicious for ?possible small volume subarachnoid hemorrhage. Possible ?proteinaceous material/debris (as in the setting of CNS infection) ?would be the primary differential consideration. Correlation with ?laboratory values recommended. Additionally, further evaluation with ?LP and CSF analysis could be performed for further evaluation as ?warranted. ?3. 4 mm focus of diffusion signal abnormality involving the ?posterior left frontotemporal region, suspicious for a small acute ?ischemic infarct. ?4. Age-related cerebral atrophy with moderate to severe chronic ?microvascular ischemic disease." ? ?Patient seen and examined at bedside.  Resting comfortably on BiPAP.  Not able to give any history at this time but denying chest pain.  Heart rate in the 120s, sinus  rhythm.  SPO2 95% on BiPAP, respiratory rate 20, blood pressure 108/94, temperature 98.2 ?F.  Diminished lung sounds appreciated on auscultation, no wheezing.  No peripheral edema appreciated.  No focal neurodeficit appreciated on exam. ? ?EKG showing sinus tachycardia, mild ST elevations in inferior leads seen on previous EKG as well.  High-sensitivity troponin 50.  Discussed with Dr. Burt Knack on-call for STEMI.  He reviewed the patient's EKG and agreed that the EKG changes are not new.  He felt that STEMI is less likely. ?-Anticoagulation contraindicated at this time given concern for brain bleed. ?-Will continue to trend troponin ? ?ABG showing pH 7.52, PCO2 44, PO2 63. ? ?Chest x-ray showing progressive asymmetric opacification throughout the left lung compatible with asymmetric edema. ?-IV Lasix 40 mg x 1 ordered ?-Continue BiPAP ? ?Neurology consulted and felt that it is difficult to tell whether MRI findings reflect small volume subarachnoid hemorrhage versus meningitis.  Patient started on antibiotics to empirically cover for meningitis.  Might need an LP versus reattempting SWI/GRE sequences on MRI brain to clarify SAH versus meningitis.  Patient was on aspirin which has been discontinued.  No anticoagulation for DVT prophylaxis, SCDs ordered.  Neurology recommending CTA head and neck to evaluate for aneurysm which has been ordered.  Repeat CT in 6 hours to ensure stability which has been ordered.  Recommending goal SBP <140. PRN labetalol ordered. ? ?I also spoke to on-call for neurosurgery (NP Meyran) given concern for subarachnoid hemorrhage, however, she felt that neurosurgery service did not need to be involved in the care of this patient. ?

## 2022-04-28 NOTE — Progress Notes (Signed)
Mobility Specialist Progress Note  ? ? 04/28/22 1055  ?Mobility  ?Activity Contraindicated/medical hold  ? ?RN advised. Will f/u as appropriate. ? ?[name] ?Mobility Specialist  ?Primary: 5N M.S. Phone: [phone] ?Secondary: 6[address] M.S. Phone: [phone] ?  ?

## 2022-04-28 NOTE — TOC Progression Note (Signed)
Transition of Care (TOC) - Progression Note  ? ? ?Patient Details  ?Name: [name] ?MRN: [id number] ?Date of Birth: 10/28/1941 ? ?Transition of Care (TOC) CM/SW Contact  ?Bary Castilla, LCSW ?Phone Number: [phone] ?04/28/2022, 4:07 PM ? ?Clinical Narrative:    ? ?CSW met with pt's spouse to discuss bed offer, pt was sleeping. CSW provided the 3 bed offers that have come back, [facility], [facility] and [facility]. Spouse indicates that she would prefer [facility]. CSW let spouse know that a bed offer had not been made yet however could be follow up on. ? ?TOC team will continue to assist with discharge planning needs.  ? ? ?Expected Discharge Plan: [place] ?Barriers to Discharge: Continued Medical Work up ? ?Expected Discharge Plan and Services ?Expected Discharge Plan: [place] ?In-house Referral: Clinical Social Work ?  ?  ?Living arrangements for the past 2 months: [place] ?                ?  ?  ?  ?  ?  ?  ?  ?  ?  ?  ? ? ?Social Determinants of Health (SDOH) Interventions ?  ? ?Readmission Risk Interventions ?   ? View : No data to display.  ?  ?  ?  ? ? ?

## 2022-04-28 NOTE — Progress Notes (Addendum)
?Neurology Progress Note   ? ?Reason for Initial Consult 5/14 early AM , "MRI concerning for [facility] vs meningitis" ? ?HISTORY OF PRESENT ILLNESS   ?HPI  ?[name] is a 81 y.o. male with PMH significant for COPD, HFrEF, DM2, CAD s/p CAG who presented with orthopnea and severe dyspnea on exertion, intermittent confusion over the last few days and new onset BL upper extremity tremors. ?  ?Workup in the ED with CXR concerning for a pneumonia, he was put on Bipap  an supplemental oxygen for hypoxia. He had additional workup with CTA/P with concern for a R adrenal mass concerning for a malignancy, CT Chest with concern for pneumonia. CTH for confusion demonstrated 23m hypderdense focus in R periventricular white matter. Further workup with MRI Brain that he was barely able to tolerate jsut the diffusion and FLAIR imaging demonstrated small volume diffusion signal abnormality layering within the occipital horns of both lateral ventricles as well as the left Sylvian fissure. ? ?--------- ?24 hour course:  ?-started on empiric ampicillin, ceftriaxone, and vancomycin  ?-Lumbar puncture performed 5/14  and CSF with xanthochromia x 4 tubes + 1300 WBCs , 90 RBCs, ~1300 WBCs, elevated protein and low glucose, pointing towards bacterial meningitis rather than SAH   ?-Patient's level of arousal improved today.  Patient is oriented x2 to person and time but not place ?-no new complaints overnight ?-Na 141-->148 and Cr 0.9--> 1.58  ?-MRI brain and cervical spine with and without contrast ordered ? ?Premorbid modified Rankin scale (mRS): 0 ?Current 4  ? ?ROS: Full ROS was performed and is negative except as noted in the HPI., albeit quite limited due to alteration in mental status.   ? ?PAST MEDICAL HISTORY   ? ?Past Medical History:  ?Past Medical History:  ?Diagnosis Date  ? Anxiety   ? CAD (coronary artery disease)   ?  s/p CABG with LIMA to LAD, SVG to diag, SVG to OM, SVG to RCA cath 2002 Dr. TRadford Pax ? CAP (community acquired  pneumonia[place] 03/03[place]  ? Cataract   ? Chronic combined systolic and diastolic CH[place] ([place]estive heart [place] 04/28[place]  ? Chronic kidney disease   ? Claudication ([facility]   ? normal ABIs  ? COPD ([place] obstructive pulmonary disease) ([place]   ? Depression   ? Diabetes mellitus without complication ([place]   ? Dyslipidemia   ? Hypersomnia   ? Hypertension   ? 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[place]   ? EF 40% with apical AK at cath intolerant to ACE I and ARBS, EF 43% by nuclear stress test 08/2010  ? Kidney stones   ? Macular degeneration   ? Morbid obesity ([place] 04/28/2018  ? ? ?No family history on file. ?Family History  ?Problem Relation Age of Onset  ? Hypertension Father   ? Breast cancer Father   ? CVA Father   ? Diabetes Mother   ? Hypertension Mother   ? CAD Sister   ? Hypertension Sister   ? Diabetes Sister   ? Heart disease Brother   ? Diabetes Brother   ? Breast cancer Sister   ? Hypertension Sister   ? Diabetes Sister   ? Heart disease Sister   ? ? ?Allergies:  ?No Known Allergies ? ?Social History:  ? reports that he quit smoking about 20 years ago. His smoking use included cigarettes. He has never used smokeless tobacco. He reports current alcohol use. He reports that he does not use drugs.   ? ?  Medications ?Medications Prior to Admission  ?Medication Sig Dispense Refill  ? acetaminophen (TYLENOL) 500 MG tablet Take 100 mg by mouth every 8 (eight) hours as needed for mild pain or fever.    ? albuterol (PROVENTIL HFA;VENTOLIN HFA) 108 (90 Base) MCG/ACT inhaler Inhale 1-2 puffs into the lungs every 6 (six) hours as needed for wheezing or shortness of breath. (Patient taking differently: Inhale 2 puffs into the lungs every 4 (four) hours as needed for wheezing or shortness of breath.) 1 Inhaler 1  ? amLODipine (NORVASC) 10 MG tablet Take 10 mg by mouth daily.    ? atorvastatin (LIPITOR) 40 MG tablet Take 1 tablet (40 mg total) by mouth daily. 90 tablet 3  ? carbamide peroxide  (DEBROX) 6.5 % OTIC solution Place 5 drops into both ears daily as needed (wax buildup).    ? dorzolamide (TRUSOPT) 2 % ophthalmic solution Place 1 drop into both eyes 2 (two) times daily.     ? furosemide (LASIX) 40 MG tablet Take 40-80 mg by mouth daily as needed for edema or fluid.  5  ? Multiple Vitamin (MULTIVITAMIN) capsule Take 1 capsule by mouth daily.    ? metoprolol succinate (TOPROL-XL) 25 MG 24 hr tablet TAKE 1 TABLET BY MOUTH ONCE DAILY (Patient taking differently: Take 25 mg by mouth daily.) 15 tablet 0  ? ? ?EXAMINATION   ? ?Current vital signs: ? ?  04/28/2022  ?  2:06 PM 04/28/2022  ?  1:49 PM 04/28/2022  ?  1:35 PM  ?Vitals with BMI  ?Systolic 381 829 937  ?Diastolic 91 68 63  ?Pulse 110 110 111  ? ? ?Examination:  ?GENERAL: arousable to verbal stimulation, NAD, intermittent myoclonic jerks of both upper extremities greater than both lower extremities ?HEENT: - Normocephalic and atraumatic, dry mm, no lymphadenopathy, no Thyromegally ?LUNGS - Clear to auscultation bilaterally ?CV - S1S2 RRR, equal pulses bilaterally. ?ABDOMEN - Soft, nontender, nondistended with normoactive BS ?Ext: warm, well perfused, intact peripheral pulses, no pedal edema ? ?NEURO:  ?Mental Status: AA&Ox2 to person and time but not place ?Language: speech is hypophonic.  in tact naming, repetition, fluency, and comprehension. Follows simple commands but complex commands only inconsistently, clearly impaired attention span  ?Cranial Nerves:  ?II: PERRL.  Blinks to threat throughout ?III, IV, VI: EOM in tact. Eyelids elevate symmetrically ?V: Sensation intact V1-3 symmetrically  ?VII: no facial asymmetry   ?VIII: hearing intact to voice ?IX, X: Hypophonic ?JI:[id number] shrug 5/5 and symmetrical  ?XII: tongue is midline without fasciculations. ?Motor:  ?5/5 throughout within limitations of attention span  ?Tone: is normal and bulk is normal ?DTRs: 2+ and symmetrical Ues, perhaps brisk both Les   ?Sensation- unable to test accurately   ?Coordination perhaps mild BL UE ataxia on finger to nose (past pointing),  myoclonus BLUEs >intermittently in both LEs abnormal movements  ?Gait- deferred ? ?LABS  ?I have reviewed labs in epic and the results pertinent to this consultation are: ? ?Lab Results  ?Component Value Date  ? LDLCALC 72 03/05/2022  ? ?Lab Results  ?Component Value Date  ? ALT 12 04/28/2022  ? AST 11 (L) 04/28/2022  ? ALKPHOS 103 04/28/2022  ? BILITOT 0.7 04/28/2022  ? ?Lab Results  ?Component Value Date  ? HGBA1C 6.5 (H) 04/26/2022  ? ?Lab Results  ?Component Value Date  ? WBC 18.6 (H) 04/28/2022  ? HGB 15.1 04/28/2022  ? HCT 47.6 04/28/2022  ? MCV 91.4 04/28/2022  ? PLT 412 (H) 04/28/2022  ? ?  No results found for: VITAMINB12 ?No results found for: FOLATE ?Lab Results  ?Component Value Date  ? NA 141 04/28/2022  ? K 3.7 04/28/2022  ? CL 99 04/28/2022  ? CO2 30 04/28/2022  ? ?DIAGNOSTIC IMAGING/PROCEDURES  ? ?I have reviewed the images obtained:, as below   ? ?CT-head  ?1. 2 mm hyperdense focus in the right parietal periventricular white ?matter, indeterminate for a tiny metastasis, focus of calcification, ?punctate hemorrhage, or vascular structure with assessment limited ?by residual contrast from today's earlier body CT. Head MRI without ?and with contrast is recommended for further evaluation. ?2. 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 ? ?CTA head and neck  ?1. No emergent finding. No aneurysm or vascular malformation as a ?cause of subarachnoid findings. ?2. No flow seen in the non dominant proximal right vertebral artery ?with distal reconstitution. ?3. Diffuse atherosclerosis with up to 50% narrowing of the right ?intracranial ICA. ?4. Gas in the ventral canal at C3-4 without evidence of underlying ?acute osseous disease, usually gas containing herniation. ?5. 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 ? ?MRI brain ?1. Technically limited exam due to patient's inability to tolerate ?the full length of the study and  extensive motion artifact. ?2. Abnormal small volume diffusion signal abnormality layering ?within the occipital horns of both lateral ventricles as well as the ?left Sylvian fissure. Findings are nonspecific, but

## 2022-04-28 NOTE — Progress Notes (Signed)
Lab notified of critical CSF values for tube 1 WBC 340 and Tube 4 WBC 1370.  PA Clemencia Course paged with results.  ?

## 2022-04-28 NOTE — Progress Notes (Signed)
Pt unable to tolerate being removed from bipap and per RN, pt cannot follow commands at this time. STAT CT trip and inhalers charted against at this time. ?

## 2022-04-28 NOTE — Progress Notes (Signed)
Lab notified RN of critical CSF glucose value of less than 20. Dr. Quinn Axe paged with results.  ?

## 2022-04-28 NOTE — Consult Note (Addendum)
NEUROLOGY CONSULTATION NOTE  ? ?Date of service: Apr 28, 2022 ?Patient Name: [name] ?MRN:  [id number] ?DOB:  24-Feb-1941 ?Reason for consult: "MRI concerning for SAH vs meningitis" ?Requesting Provider: Patrecia Pour, MD ?_ _ _   _ __   _ __ _ _  __ __   _ __   __ _ ? ?History of Present Illness  ?[name] is a 81 y.o. male with PMH significant for COPD, HFrEF, DM2, CAD s/p CAG who presented with orthopnea and severe dyspnea on exertion, intermittent confusion over the last few days and new onset BL upper extremity tremors. ? ?Workup in the ED with CXR concerning for a pneumonia, he was put on Bipap  an supplemental oxygen for hypoxia. He had additional workup with CTA/P with concern for a R adrenal mass concerning for a malignancy, CT Chest with concern for pneumonia. CTH for confusion demonstrated 85m hypderdense focus in R periventricular white matter. Further workup with MRI Brain that he was barely able to tolerate jsut the diffusion and FLAIR imaging demonstrated small volume diffusion signal abnormality layering within the occipital horns of both lateral ventricles as well as the left Sylvian fissure. Findings are nonspecific, but suspicious for possible small volume subarachnoid hemorrhage. Possible proteinaceous material/debris (as in the setting of CNS infection) would be the primary differential consideration. ? ?He is currently on bipap, encephalopathic but opens eyes and intermittently follow commands. After removing bipap mask, he is able to follow commands very well. He is disoriented. His voice is whispery and very difficult to hear. He does attempt to mouth words. ?  ?ROS  ? ?Unable to obtain detailed ROS due to hypophonia and encephalopathy. ? ?Past History  ? ?Past Medical History:  ?Diagnosis Date  ? Anxiety   ? CAD (coronary artery disease)   ?  s/p CABG with LIMA to LAD, SVG to diag, SVG to OM, SVG to RCA cath 2002 Dr. TRadford Pax ? CAP (community acquired pneumonia) due to Pneumococcus  ([facility] 03/03/2017  ? Cataract   ? Chronic combined systolic and diastolic CHF (congestive heart failure) ([facility] 04/28/2018  ? Chronic kidney disease   ? Claudication ([facility]   ? normal ABIs  ? COPD (chronic obstructive pulmonary disease) ([facility]   ? Depression   ? Diabetes mellitus without complication ([facility]   ? Dyslipidemia   ? Hypersomnia   ? Hypertension   ? 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[facility]   ? EF 40% with apical AK at cath intolerant to ACE I and ARBS, EF 43% by nuclear stress test 08/2010  ? Kidney stones   ? Macular degeneration   ? Morbid obesity ([facility] 04/28/2018  ? ?Past Surgical History:  ?Procedure Laterality Date  ? CORONARY ARTERY BYPASS GRAFT    ? with LIMA to LAD, SVG to diag, SVG to OM, SVG to RCA cath 2002 Dr. TRadford Pax ? urologic procedure for fertility    ? ?Family History  ?Problem Relation Age of Onset  ? Hypertension Father   ? Breast cancer Father   ? CVA Father   ? Diabetes Mother   ? Hypertension Mother   ? CAD Sister   ? Hypertension Sister   ? Diabetes Sister   ? Heart disease Brother   ? Diabetes Brother   ? Breast cancer Sister   ? Hypertension Sister   ? Diabetes Sister   ? Heart disease Sister   ? ?Social History  ? ?Socioeconomic History  ? Marital status: Married  ?  Spouse name: Not on file  ? Number of children: Not on file  ? Years of education: Not on file  ? Highest education level: Not on file  ?Occupational History  ? Not on file  ?Tobacco Use  ? Smoking status: Former  ?  Types: Cigarettes  ?  Quit date: 10/25/2001  ?  Years since quitting: 20.5  ? Smokeless tobacco: Never  ?Vaping Use  ? Vaping Use: Never used  ?Substance and Sexual Activity  ? Alcohol use: Yes  ?  Comment: occasional beer and wine  ? Drug use: No  ? Sexual activity: Not on file  ?Other Topics Concern  ? Not on file  ?Social History Narrative  ? Not on file  ? ?Social Determinants of Health  ? ?Financial Resource Strain: Not on file  ?Food Insecurity: Not on file  ?Transportation Needs: Not on file  ?Physical  Activity: Not on file  ?Stress: Not on file  ?Social Connections: Not on file  ? ?No Known Allergies ? ?Medications  ? ?Medications Prior to Admission  ?Medication Sig Dispense Refill Last Dose  ? acetaminophen (TYLENOL) 500 MG tablet Take 100 mg by mouth every 8 (eight) hours as needed for mild pain or fever.   04/25/2022  ? albuterol (PROVENTIL HFA;VENTOLIN HFA) 108 (90 Base) MCG/ACT inhaler Inhale 1-2 puffs into the lungs every 6 (six) hours as needed for wheezing or shortness of breath. (Patient taking differently: Inhale 2 puffs into the lungs every 4 (four) hours as needed for wheezing or shortness of breath.) 1 Inhaler 1 04/25/2022  ? amLODipine (NORVASC) 10 MG tablet Take 10 mg by mouth daily.   Past Week  ? atorvastatin (LIPITOR) 40 MG tablet Take 1 tablet (40 mg total) by mouth daily. 90 tablet 3 Past Week  ? carbamide peroxide (DEBROX) 6.5 % OTIC solution Place 5 drops into both ears daily as needed (wax buildup).   Past Month  ? dorzolamide (TRUSOPT) 2 % ophthalmic solution Place 1 drop into both eyes 2 (two) times daily.    Past Week  ? furosemide (LASIX) 40 MG tablet Take 40-80 mg by mouth daily as needed for edema or fluid.  5 Past Week  ? Multiple Vitamin (MULTIVITAMIN) capsule Take 1 capsule by mouth daily.   Past Week  ? metoprolol succinate (TOPROL-XL) 25 MG 24 hr tablet TAKE 1 TABLET BY MOUTH ONCE DAILY (Patient taking differently: Take 25 mg by mouth daily.) 15 tablet 0 04/23/2022 at 0600  ?  ? ?Vitals  ? ?Vitals:  ? 04/27/22 2118 04/28/22 0202 04/28/22 0337 04/28/22 0400  ?BP:  (!) 157/100 (!) 151/87   ?Pulse:   (!) 141 (!) 127  ?Resp:   (!) 29 (!) 22  ?Temp: 98.2 ?F (36.8 ?C)  98.2 ?F (36.8 ?C)   ?TempSrc: Oral  Oral   ?SpO2:    94%  ?Weight:      ?Height:      ?  ? ?Body mass index is 36.69 kg/m?. ? ?Physical Exam  ? ?General: Laying comfortably in bed; in no acute distress.  ?HENT: Normal oropharynx and mucosa. Normal external appearance of ears and nose.  ?Neck: Supple, no pain or tenderness   ?CV: No JVD. No peripheral edema.  ?Pulmonary: Symmetric Chest rise. Normal respiratory effort.  ?Abdomen: Soft to touch, non-tender.  ?Ext: No cyanosis, edema, or deformity  ?Skin: No rash. Normal palpation of skin.   ?Musculoskeletal: Normal digits and nails by inspection. No clubbing.  ? ?Neurologic Examination  ?  Mental status/Cognition: opens eyes to voice but somnolent, oriented to self only. ?Speech/language: Non fluent with hypophonic voice, comprehension intact to simple commands, does attempt to name objects. ?Cranial nerves:  ? CN II Pupils equal and reactive to light, unable to do detailed visual field testing secondary to somnolence and encephalopathy.  ? CN III,IV,VI EOM intact, no gaze preference or deviation, no nystagmus  ? CN V normal sensation in V1, V2, and V3 segments bilaterally  ? CN VII no asymmetry, no nasolabial fold flattening  ? CN VIII normal hearing to speech  ? CN IX & X normal palatal elevation, no uvular deviation  ? CN XI 5/5 head turn and 5/5 shoulder shrug bilaterally  ? CN XII midline tongue protrusion  ? ?Motor:  ?Muscle bulk: poor, tone normal. Intention tremors. ?Mvmt Root Nerve  Muscle Right Left Comments  ?SA C5/6 Ax Deltoid     ?EF C5/6 Mc Biceps 5 5   ?EE C6/7/8 Rad Triceps 5 5   ?WF C6/7 Med FCR     ?WE C7/8 PIN ECU     ?F Ab C8/T1 U ADM/FDI 5 5   ?HF L1/2/3 Fem Illopsoas 4+ 4+   ?KE L2/3/4 Fem Quad     ?DF L4/5 D Peron Tib Ant 5 5   ?PF S1/2 Tibial Grc/Sol 5 5   ? ?Reflexes: ? Right Left Comments  ?Pectoralis     ? Biceps (C5/6) 2 2   ?Brachioradialis (C5/6) 2 2   ? Triceps (C6/7) 2 2   ? Patellar (L3/4) 3 3   ? Achilles (S1) 2 2   ? Hoffman     ? Plantar     ?Jaw jerk   ? ?Sensation: ? Light touch Intact throughout  ? Pin prick   ? Temperature   ? Vibration   ?Proprioception   ? ?Coordination/Complex Motor:  ?- Finger to Nose despite attempt, did not do. ?- Heel to shin unable to do. ?- Rapid alternating movement are slowed. ?- Gait: Deferred/ ? ?Labs  ? ?CBC:  ?Recent  Labs  ?Lab 04/27/22 ?0412 04/28/22 ?0322  ?WBC 16.8* 18.6*  ?NEUTROABS 13.8*  --   ?HGB 13.4 15.1  ?HCT 41.9 47.6  ?MCV 91.3 91.4  ?PLT 364 412*  ? ? ?Basic Metabolic Panel:  ?Lab Results  ?Component Value Dat

## 2022-04-28 NOTE — IPAL (Signed)
Discussed the patient's current severe illness in light of his chronic medical problems with his family at bedside this evening. I've gotten to know [name] in the past couple days, and his wife. He has continued working at his age and does not ever intend on retiring. He is fiercely independent. In fact, he likely would not have presented to the hospital if not forced to do so by his wife. He is known to have said (per his spouse and daughter) that he would not want to live a dependent life.  ? ?We discussed the possibility that [name] could decompensate overnight. Specifically, I worry about respiratory distress requiring BiPAP again and perhaps him not tolerating that. In this event, it is agreed that he would not want to be intubated.  ? ?In the event that he experiences cardiac or respiratory arrest, I believe even with a successful resuscitation, his prognosis at that point would be limited to a life of significant dependence, never to return to the life he wishes to have. Therefore, we have changed his code status to DNR.  ? ?The goal of his care, however, does remain curative. Specifically, though he is not to be intubated or resuscitated, all other aggressive measures should be pursued as appropriate.  ? ?Vance Gather, MD ?04/28/2022 7:49 PM   ?

## 2022-04-28 NOTE — Progress Notes (Signed)
Just notified by CCMD that at 1720 pt had 7 bt run NSVT. Per monitor tech [place], strip saved. Will continue to monitor. Jessie Foot, RN  ?

## 2022-04-28 NOTE — Progress Notes (Addendum)
Neurology update ? ?Please see full consult note from Dr. Lorrin Goodell this AM. Briefly, this is a 41M admitted with encephalopathy, respiratory symptoms and treated for pneumonia and CHF.  CT head on admission showed a small hyperdensity in the right frontal periventricular region which was concerning for CNS infection versus subarachnoid hemorrhage.  He had MRI for further clarification and demonstrated dependent DWI material in BL occipital horns and with some in L sylvian fissure and concern for SAH vs meningitis but was unable to tolerate the entire studies.  We were unable to obtain GRE or SWI sequences to confirm if the abnormality was blood or not.  He therefore underwent lumbar puncture which showed the following results: ? ?Tube 1 ?78 RBC, 340 WBC ? ?Tube 4 ?90 RBC, 1370 WBC ? ?Neutrophilic predominance ? ?Glucose <20 ? ?Protein >600 ? ?Gram stain - WBC present, both PMN and mononuclear, no organisms seen ? ?Culture, HSV PCR, meningitis PCR send-out panel to [facility], cytology - pending ? ?These findings are concerning for bacterial meningitis.  He has been empirically covered since admission on ceftriaxone, vanc, ampicillin. These results are not consistent with viral meningitis/encephalitis and both myself and Dr. Juleen China of ID do not feel that he needs to be on empiric acyclovir. ? ?He has myoclonus in all 4 extremities which is new. He was agitated overnight but is currently unresponsive except to deep noxious stimuli (but protecting airway). As such I think he will tolerate further imaging and have ordered brain and c spine MRI with and without contrast to rule out abscess / pockets of localized infection. ? ?I do not feel that his results indicate existence of concurrent SAH. While there was not significant clearing of RBCs between tubes 1 and 4, a small study reported in the emergency medicine literature suggested an absolute cutoff value of <500 RBC in tube 4 is reassuring against SAH (Gorchynski  2007). ? ?Patient is at high risk of rapid deterioration. Dr. Bonner Puna had a long discussion with his wife about this possibility and she has changed his CODE STATUS to DNAR in accordance with what she believes his wishes to be under these circumstances. ? ?Updated recommendations: ? ?- Continue vanc/ceftriaxone/ampicillin. Further abx guidance per ID ?- F/u outstanding CSF labs ?- MRI brain and c spine with and without contrast ?- Patient's household contacts will be sent to ED for single dose chemoprophylaxis against possible meningococcus. People with brief exposure including healthcare workers do not need chemoprophylaxis unless they had direct contact with respiratory secretions (suctioning, intubation etc) ?- Droplet precautions ?- If patient's mental status does not improve, consider EEG after MRIs completed ?- OK to start pharmacologic DVT prophylaxis ?- Neurology will continue to follow ? ?Su Monks, MD ?[facility] ?[phone] ? ?If 7pm- 7am, please page neurology on call as listed in [place]. ? ? ?Gorchynski J, [place] J, [place] T. Interpretation of traumatic lumbar punctures in the setting of possible subarachnoid hemorrhage: who can be safely discharged? [place] J Emerg Med. 2007 Feb;8(1):3-7. PMID: [id number]; PMCID: [id number]. ?

## 2022-04-28 NOTE — Progress Notes (Signed)
RT called to room due to patient O2 SATS being 83%.  Patient was on HFNC [place] of 15 L with O2 SATS improving to 88-89%.   RT placed patient on BiPAP and O2 SATS improved on 40% FiO2. Patient is tolerating the BiPAP at this time.  RT will continue to monitor.  ?

## 2022-04-28 NOTE — Progress Notes (Signed)
Pt not alert enough to swallow pills. Pt has Toprol XL '50mg'$  due at 2200. Provider on call paged to possible switch to IV metoprolol. Jessie Foot, RN  ?

## 2022-04-28 NOTE — Progress Notes (Signed)
Brief Neuro Update: ? ?I ordered MRI C spine with and without contrast. Given the new onset tremors and incoordination, this could be a subtle sign for a non spinal cord compressing epidural abscess. This has to be in the C spine given that the tremor/ataxia involves all extremities. ? ?Donnetta Simpers ?[facility] ?Pager Number [phone] ?

## 2022-04-28 NOTE — Progress Notes (Signed)
?   04/28/22 1435  ?Assess: MEWS Score  ?BP (!) 154/96  ?Pulse Rate (!) 105  ?ECG Heart Rate (!) 114  ?Resp (!) 21  ?SpO2 91 %  ?O2 Device Bi-PAP  ?FiO2 (%) 50 %  ?Assess: MEWS Score  ?MEWS Temp 0  ?MEWS Systolic 0  ?MEWS Pulse 2  ?MEWS RR 1  ?MEWS LOC 1  ?MEWS Score 4  ?MEWS Score Color Red  ?Assess: if the MEWS score is Yellow or Red  ?Were vital signs taken at a resting state? Yes  ?Focused Assessment No change from prior assessment  ?Early Detection of Sepsis Score *See Row Information* High  ?MEWS guidelines implemented *See Row Information* Yes  ?Take Vital Signs  ?Increase Vital Sign Frequency  Red: Q 1hr X 4 then Q 4hr X 4, if remains red, continue Q 4hrs  ?Escalate  ?MEWS: Escalate Red: discuss with charge nurse/RN and provider, consider discussing with RRT  ?Notify: Charge Nurse/RN  ?Name of Charge Nurse/RN Notified Janett Billow RN  ?Date Charge Nurse/RN Notified 04/28/22  ?Time Charge Nurse/RN Notified 1440  ?Notify: Provider  ?Provider Name/Title Dr Bonner Puna  ?Date Provider Notified 04/28/22  ?Time Provider Notified 1440  ?Method of Notification Page  ?Notification Reason Other (Comment) ?(New Red MEWs score)  ?Provider response No new orders  ?Date of Provider Response 04/28/22  ?Time of Provider Response 1442  ?Document  ?Patient Outcome Other (Comment) ?(remains stable)  ?Progress note created (see row info) Yes  ? ? ?

## 2022-04-28 NOTE — Progress Notes (Signed)
Pharmacy Antibiotic Note ? ?[name] is a 81 y.o. male admitted on 04/25/2022 with respiratory failure d/t COPD and CHF, now w/ MRI concerning for meningitis.  Pharmacy has been consulted for vancomycin, ceftriaxone, and ampicillin dosing. ? ?Plan: ?Vancomycin '2000mg'$  x1 then '1250mg'$  IV every 12 hours.  Goal trough 15-20 mcg/mL. ?Ceftriaxone 2g IV Q12H. ?Ampicillin 2g IV Q4H. ?Holding off on antiviral coverage for now per neuro. ? ?Height: '5\' 6"'$  (167.6 cm) ?Weight: 98.1 kg (216 lb 4.3 oz) ?IBW/kg (Calculated) : 63.8 ? ?Temp (24hrs), Avg:98.2 ?F (36.8 ?C), Min:97.8 ?F (36.6 ?C), Max:98.5 ?F (36.9 ?C) ? ?Recent Labs  ?Lab 04/25/22 ?1104 04/25/22 ?1120 04/26/22 ?0245 04/27/22 ?8309 04/28/22 ?0322  ?WBC 16.0*  --   --  16.8* 18.6*  ?CREATININE 1.02  --  0.91 0.74 0.92  ?LATICACIDVEN  --  1.6  --   --   --   ?  ?Estimated Creatinine Clearance: 72.5 mL/min (by C-G formula based on SCr of 0.92 mg/dL).   ? ?No Known Allergies ? ? ?Thank you for allowing pharmacy to be a part of this patient?s care. ? ?Wynona Neat, PharmD, BCPS  ?04/28/2022 7:10 AM ? ?

## 2022-04-28 NOTE — Progress Notes (Signed)
Patient taken with nurse and respiratory therapist for stat CT at 0800hrs, returned at 0835hrs.   ?

## 2022-04-28 NOTE — Progress Notes (Signed)
?Progress Note ? ?Patient: [name] PJA:[id number] DOB: 07-Mar-1941  ?DOA: 04/25/2022  DOS: 04/28/2022  ?  ?Brief hospital course: ?CLOYDE OREGEL is a 81 y.o. 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 He was admitted for acute on chronic hypoxic respiratory failure with suspicion of acute CHF and/or acute AECOPD with subsequent CTA chest showing no PE. He was afebrile with leukocytosis and low back pain in addition to his chronic neck pain. Imaging revealed a presacral density, adrenal mass, upper normal sized mediastinal lymph nodes. CT head showed nonspecific 70m hyperdense focus if periventricular parietal white matter for which subsequent MRI was ordered. This was severely motion degraded and limited, but revealed abnormal diffusion signal abnormality layering within the occipital horns of lateral ventricles and Sylvian fissure suggestive of SAH vs. meningitis. Neurology was consulted, performed LP which is consistent with bacterial meningitis.  ? ?Assessment and Plan: ?Acute on chronic hypoxic respiratory failure: Presumably due to acute CHF and COPD exacerbation, CT w/pulmonary parenchyma shows emphysema with peribronchial thickening, minimal atelectasis and no effusion/infiltrate/PTX. No wheezes on exam. CTA chest without PE. There is evidence of atelectasis, pneumonia, and interstitial edema. No PE by CTA. No LE DVT by venous U/S. ?- Continue diuresis. Despite sepsis physiology, his respiratory status is currently the most perilous, requiring BiPAP at 50%. With lasix this AM, now down to HFNC.  ?- Patient is confirmed to be DNR/DNI. Please see goals of care discussion in separate note. ?- Ok to start VTE ppx in AM.  ?- Continue baseline COPD medications and prn albuterol. No wheezing.  ? ?Bacterial meningitis: Negative gram stain, though Neutrophils severely elevated with  low glucose on LP 5/14.  ?- Started high dose vancomycin, ampicillin, ceftriaxone at high doses this AM prior to LP empirically. D/w ID, Dr. WJuleen Chinawho agrees with continuing this regimen. ?- No indication of organism on gram stain at this time. Will institute droplet precautions.  ?- Guidelines suggest that the patient's wife who is his only close household contact for the preceding 7 days should receive chemoprophylaxis. I will contact her and Rx cipro depending on my discussion with her. ?- ID consulted. Likelihood of HSV encephalitis seems to be low given LP findings w/neutrophilic predominance. The use of acyclovir pending HSV PCR was considered, though running with continuous IVF while trying to diurese, and risk of renal impairment with multiple dye loads and sepsis and vancomycin use suggest risk is greater than potential benefit at this time.  ?- Check MRI brain/C spine to evaluate for collections and further define infarct. ? ?Severe back pain: Relatively abrupt onset, radiates up and downward, after long distance driving. +Paraspinal spasm on exam without step offs or midline point tenderness. No myelopathy/radiculopathy. History of symptomatic ureterolithiasis, though no obstructing stone noted on CT and no RBCs in UA. Nonspecific mild perinephric fat plan stranding but no pyuria, negative culture. CT C/A/P not dedicated to spine, though no acute osseous findings per radiology and my personal interpretation.  ?- Treat empirically for lumbar paraspinal spasm with tylenol, robaxin, heating pad. Can continue prn oxycodone and/or morphine.  ? 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 Missed doses of lasix as he does often when on long road trips. BNP elevated to 540. ?- Give lasix this AM. Cardiology consulted.  ?- Continue  metoprolol. Increased dose to limit cardiac demand with stable pressures. ? ?CAD s/p CABG: Troponin initially negative at 14.  Nonischemic ECG ?- Continue aspirin, metoprolol, statin ?- With ongoing sinus tachycardia, I believe ECG was checked last night and automated interpretation of STEMI, though with discussion with cardiology this was not felt to be accurate. Troponin elevated to 50, though with his CAD history and significant sustained tachycardia, this is lower than I would anticipate and not consistent with ACS.  ? ?Presacral mass: Indeterminate, incidental. Not rim-enhancing. ?- Discussed with IR. With multiple targets, I'm not sure what is signal and what is noise. I would suggest we perform a PET-CT prior to selecting a target. They feel this is prudent and will be available if plans change.   ? ?Bacteremia vs. contaminant: Strep anginosis in 1 of 4 blood cultures from admission. Not a typical pathogenic strain by rapid ID. Despite acute infection in CNS, this remains a likely contaminant. ? 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 ?- Urine and plasma metanephrines, renin and aldosterone pending ?- Cortisol is 49.2 ?- Needs PET to delineate this as well as presacral density on CT.  ? ?Neck mass: CT showed lipoma, which is consistent with palpation on exam. Also upper normal sized LN's. ? ?Acute CVA: Suggested by 60m diffusion signal abnormality involving the posterior left frontotemporal region on limited MRI.  ?- Ok to start ASA per neurology, d/w Dr. SQuinn Axe in AM. Repeat MRI pending. ? ?Aphonia, history of dysphagia: Unclear etiology in this case. It is not aphasia. ?if could be related to acute ischemia seen on MRI.  ?- MRI being repeated now that patient able to tolerate (held still for LP this AM). ?- Esophagram Feb 2022 showed mild dysmotility without stricture or mass.  ? ?Tremor: Suspect this is due to CNS infection. Not consistent with seizure. ? ?NIDT2DM: HbA1c 6.5%.  ?- Continue moderate SSI, hold metformin with contrast exposures.  ? ?Multifocal pneumonia: With leukocytosis, respiratory decompensation, and increasing  consolidations on CTA today.  ?- Restart ceftriaxone and azithromycin. ?- Monitor blood cultures drawn at admission  ? ?Obesity: Estimated body mass index is 34.91 kg/m? as calculated from the following: ?  Height as of this encounter: '5\' 6"'$  (1.676 m). ?  Weight as of this encounter: 98.1 kg. ? ?Subjective: Overnight events noted. Pt is less responsive this morning, less restless but still awakens in pain when not receiving pain medications. No fevers. Does complain of shortness of breath at times.  ? ?Objective: ?Vitals:  ? 04/28/22 1505 04/28/22 1535 04/28/22 1633 04/28/22 1741  ?BP: 110/82 119/80 (!) 143/99 99/67  ?Pulse: (!) 110 (!) 111 (!) 111 (!) 109  ?Resp: (!) 23 (!) 23 (!) 24 12  ?Temp:  98 ?F (36.7 ?C) 98.6 ?F (37 ?C) 98.2 ?F (36.8 ?C)  ?TempSrc:  Axillary Axillary Axillary  ?SpO2: 94% 92% 95% 95%  ?Weight:      ?Height:      ? ?Gen: 81y.o. male in no distress ?Neck: Limited ROM is baseline with some generalized tenderness posteriorly. Unable to participate in Kernig/Brudzinski.  ?Pulm: Nonlabored tachypnea with crackles at bases. ?CV: Regular tachycardia without murmur, rub, or gallop. No JVD, trace dependent edema. ?GI: Abdomen soft, non-tender, non-distended, with normoactive bowel sounds.  ?Ext: Warm, no deformities ?Skin: No new rashes, lesions or ulcers on visualized skin. ?Neuro: Drowsy but rousable and oriented x2. MAE, still very little phonation.   ?Psych: Unable to completely determine. He has become slightly more encephalopathic  through the day.  ? ?Data Personally reviewed: ?CBC: ?Recent Labs  ?Lab 04/25/22 ?1104 04/25/22 ?1706 04/27/22 ?6314 04/28/22 ?0322  ?WBC 16.0*  --  16.8* 18.6*  ?NEUTROABS  --   --  13.8*  --   ?HGB 14.0 13.9 13.4 15.1  ?HCT 42.3 41.0 41.9 47.6  ?MCV 89.4  --  91.3 91.4  ?PLT 282  --  364 412*  ? ?Basic Metabolic Panel: ?Recent Labs  ?Lab 04/25/22 ?1104 04/25/22 ?1706 04/26/22 ?0245 04/27/22 ?9702 04/28/22 ?6378  ?NA 134* 133* 135 139 141  ?K 3.5 3.1* 3.6 3.7 3.7  ?CL  96*  --  94* 100 99  ?CO2 26  --  '29 30 30  '$ ?GLUCOSE 196*  --  143* 218* 213*  ?BUN 20  --  17 24* 27*  ?CREATININE 1.02  --  0.91 0.74 0.92  ?CALCIUM 9.2  --  8.8* 9.5 9.7  ?MG  --   --  2.1 2.2 2.2  ? ?GFR: ?Es

## 2022-04-28 NOTE — Significant Event (Signed)
Rapid Response Event Note  ? ?Reason for Call :  ?Tachycardia/SOB/back pain. EKG reading "STEMI." ? ?Called originally at 0320 due to above. RRT with another pt but was able to review EKG. ST changes present on previous EKG as well. Recommended MD evaluation of pt.  ? ?RRT called RN at [phone] while en route to see pt. Per RN, pt was now c/o chest pain, SpO2-71% on 6L [place]. RN placing pt on HFNC [place] and respiratory on way to place pt on bipap.  ? ?Initial Focused Assessment:  ?Pt lying in bed with eyes closed, in resp distress. Pt now on .60 bipap, SpO2-85%-these improved to 93% during exam. Pt alert and oriented, c/o 5/10 mid-sternal chest pain/SOB. Lungs diminished t/o. Skin warm and dry.  ? ?HR-140, BP-151/87>108/94, RR-26, SpO2-93% on .60 bipap.  ? ?Interventions:  ?EKG-"ST, Minimal voltage criteria for LVH, may be normal variant ( [employer] product ) ?Inferior infarct , possibly acute ?Anterior infarct , age undetermined ?T wave abnormality, consider lateral ischemia ** ACUTE MI / STEMI ** ?Consider right ventricular involvement in acute inferior infarct" ?Bipap ?Metoprolol '5mg'$  IV ?PCXR ?ABG ?Cardiology coming to see pt ?Plan of Care:  ?NTG SL considered but not given d/t SBP-100. Continue to monitor pt closely. Await Cardiology evaluation. Call RRT if further assistance needed. ? ?Event Summary:  ? ?MD Notified: Dr. Marlowe Sax ?Call [facility] ?Arrival [id number] ?End [id number] ? ?Dillard Essex, RN ?

## 2022-04-28 NOTE — Procedures (Signed)
Lumbar Puncture Procedure Note ? ?[name]  ?[id number]  ?06/25/1941 ? ?Date:04/28/22  ?Time:2:26 PM  ? ?Provider Performing:Manahil Vanzile Bartholomew Crews  ? ?Procedure: Lumbar Puncture (88280) ? ?Indication(s) ?Rule out meningitis, inflammatory dz and/or SAH  ? ?Consent ?Risks of the procedure as well as the alternatives and risks of each were explained to the patient and/or caregiver.  Consent for the procedure was obtained and is signed in the bedside chart. Patient unable to consent, and discussed with his wife at length.  ? ?Anesthesia ?Topical only with 1% lidocaine 5 cc  ? ? ?Time Out ?Verified patient identification, verified procedure, site/side was marked, verified correct patient position, special equipment/implants available, medications/allergies/relevant history reviewed, required imaging and test results available. ? ? ?Sterile Technique ?Maximal sterile technique including sterile barrier drape, hand hygiene, sterile gown, sterile gloves, mask, hair covering. ? ?Procedure Description ?Using palpation, approximate location of L3-L4 space identified.   Lidocaine used to anesthetize skin and subcutaneous tissue overlying this area.  A 20g spinal needle was then used to access the subarachnoid space. Opening pressure: 12cm H2O. Closing pressure:Not obtained. 20 Cc CSF obtained. ? ?Complications/Tolerance ?None; patient tolerated the procedure well. ? ? ?EBL ?Minimal ? ?Specimen(s) ?CSF with obvious xanthochromia x 4 tubes.  ? ? ? ?

## 2022-04-29 ENCOUNTER — Inpatient Hospital Stay (HOSPITAL_COMMUNITY): Payer: Medicare Other

## 2022-04-29 DIAGNOSIS — J9601 Acute respiratory failure with hypoxia: Secondary | ICD-10-CM | POA: Diagnosis not present

## 2022-04-29 DIAGNOSIS — I10 Essential (primary) hypertension: Secondary | ICD-10-CM | POA: Diagnosis not present

## 2022-04-29 DIAGNOSIS — E278 Other specified disorders of adrenal gland: Secondary | ICD-10-CM | POA: Diagnosis not present

## 2022-04-29 DIAGNOSIS — B9689 Other specified bacterial agents as the cause of diseases classified elsewhere: Secondary | ICD-10-CM

## 2022-04-29 DIAGNOSIS — R7989 Other specified abnormal findings of blood chemistry: Secondary | ICD-10-CM | POA: Diagnosis not present

## 2022-04-29 DIAGNOSIS — J441 Chronic obstructive pulmonary disease with (acute) exacerbation: Secondary | ICD-10-CM | POA: Diagnosis not present

## 2022-04-29 DIAGNOSIS — A419 Sepsis, unspecified organism: Secondary | ICD-10-CM

## 2022-04-29 DIAGNOSIS — I5023 Acute on chronic systolic (congestive) heart failure: Secondary | ICD-10-CM | POA: Diagnosis not present

## 2022-04-29 DIAGNOSIS — G039 Meningitis, unspecified: Secondary | ICD-10-CM

## 2022-04-29 DIAGNOSIS — J9621 Acute and chronic respiratory failure with hypoxia: Secondary | ICD-10-CM | POA: Diagnosis not present

## 2022-04-29 DIAGNOSIS — G0490 Encephalitis and encephalomyelitis, unspecified: Secondary | ICD-10-CM | POA: Diagnosis not present

## 2022-04-29 DIAGNOSIS — R000 Tachycardia, unspecified: Secondary | ICD-10-CM | POA: Diagnosis not present

## 2022-04-29 LAB — BASIC METABOLIC PANEL
Anion gap: 14 (ref 5–15)
BUN: 41 mg/dL — ABNORMAL HIGH (ref 8–23)
CO2: 29 mmol/L (ref 22–32)
Calcium: 9.3 mg/dL (ref 8.9–10.3)
Chloride: 105 mmol/L (ref 98–111)
Creatinine, Ser: 1.58 mg/dL — ABNORMAL HIGH (ref 0.61–1.24)
GFR, Estimated: 44 mL/min — ABNORMAL LOW (ref >60)
Glucose, Bld: 151 mg/dL — ABNORMAL HIGH (ref 70–99)
Potassium: 3.9 mmol/L (ref 3.5–5.1)
Sodium: 148 mmol/L — ABNORMAL HIGH (ref 135–145)

## 2022-04-29 LAB — CBC WITH DIFFERENTIAL/PLATELET
Abs Immature Granulocytes: 0.28 10*3/uL — ABNORMAL HIGH (ref 0.00–0.07)
Basophils Absolute: 0.1 10*3/uL (ref 0.0–0.1)
Basophils Relative: 0 %
Eosinophils Absolute: 0 10*3/uL (ref 0.0–0.5)
Eosinophils Relative: 0 %
HCT: 46.7 % (ref 39.0–52.0)
Hemoglobin: 14.8 g/dL (ref 13.0–17.0)
Immature Granulocytes: 1 %
Lymphocytes Relative: 10 %
Lymphs Abs: 2 10*3/uL (ref 0.7–4.0)
MCH: 29.3 pg (ref 26.0–34.0)
MCHC: 31.7 g/dL (ref 30.0–36.0)
MCV: 92.5 fL (ref 80.0–100.0)
Monocytes Absolute: 2.2 10*3/uL — ABNORMAL HIGH (ref 0.1–1.0)
Monocytes Relative: 11 %
Neutro Abs: 16.1 10*3/uL — ABNORMAL HIGH (ref 1.7–7.7)
Neutrophils Relative %: 78 %
Platelets: 429 10*3/uL — ABNORMAL HIGH (ref 150–400)
RBC: 5.05 MIL/uL (ref 4.22–5.81)
RDW: 13.7 % (ref 11.5–15.5)
WBC: 20.6 10*3/uL — ABNORMAL HIGH (ref 4.0–10.5)
nRBC: 0 % (ref 0.0–0.2)

## 2022-04-29 LAB — GLUCOSE, CAPILLARY
Glucose-Capillary: 118 mg/dL — ABNORMAL HIGH (ref 70–99)
Glucose-Capillary: 144 mg/dL — ABNORMAL HIGH (ref 70–99)
Glucose-Capillary: 155 mg/dL — ABNORMAL HIGH (ref 70–99)
Glucose-Capillary: 181 mg/dL — ABNORMAL HIGH (ref 70–99)
Glucose-Capillary: 189 mg/dL — ABNORMAL HIGH (ref 70–99)

## 2022-04-29 LAB — COMPREHENSIVE METABOLIC PANEL
ALT: 11 U/L (ref 0–44)
AST: 13 U/L — ABNORMAL LOW (ref 15–41)
Albumin: 2.4 g/dL — ABNORMAL LOW (ref 3.5–5.0)
Alkaline Phosphatase: 96 U/L (ref 38–126)
Anion gap: 19 — ABNORMAL HIGH (ref 5–15)
BUN: 58 mg/dL — ABNORMAL HIGH (ref 8–23)
CO2: 29 mmol/L (ref 22–32)
Calcium: 9 mg/dL (ref 8.9–10.3)
Chloride: 105 mmol/L (ref 98–111)
Creatinine, Ser: 2.35 mg/dL — ABNORMAL HIGH (ref 0.61–1.24)
GFR, Estimated: 28 mL/min — ABNORMAL LOW (ref >60)
Glucose, Bld: 136 mg/dL — ABNORMAL HIGH (ref 70–99)
Potassium: 3.5 mmol/L (ref 3.5–5.1)
Sodium: 153 mmol/L — ABNORMAL HIGH (ref 135–145)
Total Bilirubin: 0.7 mg/dL (ref 0.3–1.2)
Total Protein: 6.5 g/dL (ref 6.5–8.1)

## 2022-04-29 MED ORDER — METOPROLOL TARTRATE 5 MG/5ML IV SOLN: 7.5000 mg | Freq: Four times a day (QID) | INTRAVENOUS | Status: DC

## 2022-04-29 MED ORDER — VANCOMYCIN VARIABLE DOSE PER UNSTABLE RENAL FUNCTION (PHARMACIST DOSING): Status: DC

## 2022-04-29 MED ORDER — METOPROLOL TARTRATE 5 MG/5ML IV SOLN
5.0000 mg | Freq: Once | INTRAVENOUS | Status: AC
  Administered 2022-04-29: 5 mg via INTRAVENOUS

## 2022-04-29 MED ORDER — ORAL CARE MOUTH RINSE
15.0000 mL | Freq: Two times a day (BID) | OROMUCOSAL | Status: DC
  Administered 2022-04-29: 15 mL via OROMUCOSAL

## 2022-04-29 MED ORDER — SODIUM CHLORIDE 0.9 % IV SOLN
2.0000 g | Freq: Three times a day (TID) | INTRAVENOUS | Status: DC
  Administered 2022-04-29 – 2022-04-30 (×2): 2 g via INTRAVENOUS
  Filled 2022-04-29 (×3): qty 2000

## 2022-04-29 MED ORDER — METOPROLOL TARTRATE 25 MG PO TABS
25.0000 mg | ORAL_TABLET | Freq: Once | ORAL | Status: AC
  Administered 2022-04-29: 25 mg via ORAL
  Filled 2022-04-29: qty 1

## 2022-04-29 MED ORDER — SODIUM CHLORIDE 0.45 % IV SOLN: INTRAVENOUS | Status: DC

## 2022-04-29 MED ORDER — METOPROLOL TARTRATE 5 MG/5ML IV SOLN
5.0000 mg | Freq: Four times a day (QID) | INTRAVENOUS | Status: DC
  Administered 2022-04-29 – 2022-05-09 (×37): 5 mg via INTRAVENOUS
  Filled 2022-04-29 (×39): qty 5

## 2022-04-29 MED ORDER — HYDROMORPHONE HCL 1 MG/ML IJ SOLN
0.5000 mg | INTRAMUSCULAR | Status: DC | PRN
  Administered 2022-04-30 – 2022-05-03 (×5): 1 mg via INTRAVENOUS
  Filled 2022-04-29 (×6): qty 1

## 2022-04-29 MED ORDER — METOPROLOL SUCCINATE ER 50 MG PO TB24: 75.0000 mg | ORAL_TABLET | Freq: Two times a day (BID) | ORAL | Status: DC

## 2022-04-29 NOTE — Progress Notes (Signed)
Modified Barium Swallow Progress Note ? ?Patient Details  ?Name: [name] ?MRN: [id number] ?Date of Birth: [birth year] ? ?Today's Date: 04/29/2022 ? ?Modified Barium Swallow completed.  Full report located under Chart Review in the Imaging Section. ? ?Brief recommendations include the following: ? ?Clinical Impression ? 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 Orally, he has extra lingual manipulation and slow transit of boluses with premature spillage of thinner consistencies and increased lingual residue of thicker ones. Thin and nectar thick liquids that reach the pyriform sinuses before the swallow are aspirated during the swallow in the setting of incomplete airway closure. Honey thick liquids and purees are not as immediately aspirated, but there is more residue at the pyriform sinuses as well as residue within the cervical esophagus/outpouching that backflow into the pyriform sinuses, with small amounts of aspiration that occurs after the swallow when imaging was turned off. Aspiration across consistencies was largely silent (PAS#8) with the exception of aspiration of a larger volume of thin liquids that elicited a throat clear. Pt could not cognitively carry out strategies during this study. Recommend that he remain NPO. In light of current respiratory status and intermittent need for BiPAP, would consider offering meds via alternate route as pharyngeal residue was aspirated during MBS. If MD agrees, could offer one or two ice chips after oral care to provide some moisture and allow for use of swallowing musculature. SLP will continue to follow. May also wish to consider ENT consult given report of acute onset aphonia and frequency of aspiration. ?  ?Swallow Evaluation Recommendations ? ? Recommended Consults: Consider ENT evaluation ? ? SLP Diet Recommendations: NPO ? ?   ? ? Medication Administration: Via  alternative means ? ?   ? ?   ? ?   ? ? Oral Care Recommendations: Oral care QID ? ?   ? ? ? ?Osie Bond., M.A. CCC-SLP ?Acute Rehabilitation Services ?Office [phone] ? ?Secure chat preferred ? ?04/29/2022,5:49 PM ?

## 2022-04-29 NOTE — Progress Notes (Signed)
PM Rounding Note ? ?Patient again evaluated at bedside with multiple family members here. He's had swallow study that showed definite silent aspiration. The patient continues to be aphonic. He does not have expressive aphasia or dysarthria. He is also unable to mount an actual cough. Remains on 15L HFNC with no respiratory distress, crackles at bases and SpO2 90-92%. ?- I've asked for ENT evaluation, discussed with Dr. Marcelline Deist who can evaluate the patient 5/16.  ?- We will maintain strict NPO. PO medications either discontinued or transitioned to IV (metoprolol changed to '5mg'$  IV q6h, hold for MAP <75).  ? ?Also noted is progressive renal failure, presumably prerenal azotemia with free water deficit from insensible losses despite holding lasix. He is still making urine, albeit darker. Leukocytosis also noted to be slightly worse (certainly no better).  ?- We will start 1/2NS @ 100cc/hr and monitor urine output, renal parameters.  ? ?I've shared my concern with the family that IV fluid, while necessary to avoid renal failure is also likely to worsen oxygenation. Confirmed that we will use BiPAP in that case, but no further escalation of care. I touched on the fact that he would not be a candidate for hemodialysis. His condition is growing more perilous, a fact very clearly understood by his family.  ? ?Vance Gather, MD ?04/29/2022 6:06 PM   ? ? ?

## 2022-04-29 NOTE — Care Management Important Message (Signed)
Important Message ? ?Patient Details  ?Name: [name] ?MRN: [id number] ?Date of Birth: 09-14-1941 ? ? ?Medicare Important Message Given:  Yes ? ? ? ? ?[name] ?04/29/2022, 10:23 AM ?

## 2022-04-29 NOTE — Progress Notes (Signed)
?   04/29/22 0020  ?Assess: MEWS Score  ?Pulse Rate (!) 118  ?ECG Heart Rate (!) 119  ?Resp 16  ?SpO2 95 %  ?O2 Device HFNC  ?O2 Flow Rate (L/min) 15 L/min  ?Assess: MEWS Score  ?MEWS Temp 0  ?MEWS Systolic 0  ?MEWS Pulse 2  ?MEWS RR 0  ?MEWS LOC 1  ?MEWS Score 3  ?MEWS Score Color Yellow  ?Assess: if the MEWS score is Yellow or Red  ?Were vital signs taken at a resting state? Yes  ?Focused Assessment No change from prior assessment  ?Early Detection of Sepsis Score *See Row Information* High  ? ? ?

## 2022-04-29 NOTE — Progress Notes (Addendum)
Heart Failure Navigator Progress Note ? ?Following this hospitalization to assess for HV TOC readiness.  ? ?EF 50-55% ?Bacterial meningitis ?On BiPap ? ?Earnestine Leys, BSN, RN ?Heart Failure Nurse Navigator ?Secure Chat Only  ?

## 2022-04-29 NOTE — Progress Notes (Addendum)
?Progress Note ? ?Patient: [name]:[id number] DOB: 1941-01-04  ?DOA: 04/25/2022  DOS: 04/29/2022  ?  ?Brief hospital course: ?[name] is a 81 y.o. 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 He was admitted for acute on chronic hypoxic respiratory failure with suspicion of acute CHF and/or pneumonia with subsequent CTA chest showing no PE. He was afebrile with leukocytosis and low back pain in addition to his chronic neck pain. Imaging revealed a presacral density, adrenal mass, upper normal sized mediastinal lymph nodes. CT head showed nonspecific 40m hyperdense focus if periventricular parietal white matter for which subsequent MRI was ordered. This was severely motion degraded and limited, but revealed abnormal diffusion signal abnormality layering within the occipital horns of lateral ventricles and Sylvian fissure suggestive of SAH vs. meningitis. Neurology was consulted, performed LP which is consistent with bacterial meningitis.  ? ?Assessment and Plan: ?Acute on chronic hypoxic respiratory failure: Presumably due to acute CHF and COPD exacerbation, CT w/pulmonary parenchyma shows emphysema with peribronchial thickening, minimal atelectasis and no effusion/infiltrate/PTX. No wheezes on exam. CTA chest without PE. There is evidence of atelectasis, pneumonia, and interstitial edema. No PE by CTA. No LE DVT by venous U/S. ?- Repeat CXR ordered and viewed this morning. Shows L > R opacity predominantly interstitial without alveolar pattern. Blunted L base c/w effusion. ?- Will pause diuresis given his hypernatremic AKI. Hoping with improved mental status to restart po intake to autoregulate volume status. Continues to require 15L HFNC, so reluctant to start IVF. ?- Patient is confirmed to be DNR/DNI. ?- Continue baseline  COPD medications and prn albuterol. No wheezing.  ?- As level of arousal improves, will need to encourage frequent incentive spirometry. ? ?Bacterial meningitis: Negative gram stain, though Neutrophils severely elevated with low glucose, high protein on LP 5/14.  ?- Started high dose vancomycin, ampicillin, ceftriaxone at high doses. ID consulted. D/w Drs. Vu, Manandhar, Snider this AM. Nothing growing on smear this AM.  ?- Institute droplet precautions.  ?- Household contacts over the past 7 days have been given chemoprophylaxis on 5/14. No further needs in this regard per multiple physicians. ?- ID consulted. Avoiding acyclovir at this time.  ?- Check MRI brain/C spine to evaluate for collections and further define infarct. ? ?Severe back pain: Relatively abrupt onset, radiates up and downward, after long distance driving. +Paraspinal spasm on exam without step offs or midline point tenderness. No myelopathy/radiculopathy. History of symptomatic ureterolithiasis, though no obstructing stone noted on CT and no RBCs in UA. Nonspecific mild perinephric fat plan stranding but no pyuria, negative culture. CT C/A/P not dedicated to spine, though no acute osseous findings per radiology and my personal interpretation.  ?- Treat empirically for lumbar paraspinal spasm with tylenol, robaxin, heating pad. Can continue prn oxycodone and/or morphine.  ? 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 Missed doses of lasix as he does often when on long road trips. BNP elevated to 540. ?- Given lasix 5/14, holding further dosing. Cardiology consulted.  ?- Continue metoprolol. Will augment dose with rising BP. ? ?Acute CVA: Suggested by 428mdiffusion signal abnormality involving the posterior left frontotemporal region on limited MRI.  ?- Ok to start ASA per neurology, d/w Dr. StQuinn Axein AM. Repeat MRI pending. ? ?  CAD s/p CABG: Troponin initially negative at 14, subsequent evidence of  demand ischemia (up to 12) due to sepsis. ACS is not currently suspected.  ?- Continue aspirin, metoprolol, statin ?- Cardiology consulted, no changes to management or ischemic work up currently planned. ? ?Multifocal pneumonia: Increasing consolidations on CTA coinciding with respiratory deterioration. ?- Started abx and will continue as above.  ?- Monitor blood cultures drawn at admission  ? ?Presacral mass: Indeterminate, incidental. Not rim-enhancing. ?- Discussed with IR. With multiple targets, I'm not sure what is signal and what is noise. I would suggest we perform a PET-CT prior to selecting a target. They feel this is prudent and will be available if plans change.   ? ?Bacteremia vs. contaminant: Strep anginosis in 1 of 4 blood cultures from admission. Not a typical pathogenic strain by rapid ID. Despite acute infection in CNS, this remains a likely contaminant. ? 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 ?- Urine and plasma metanephrines, renin and aldosterone pending ?- Cortisol is 49.2 ?- Needs PET to delineate this as well as presacral density on CT.  ? ?Neck mass: CT showed lipoma, which is consistent with palpation on exam. Also upper normal sized LN's. ? ?Aphonia, history of dysphagia: Unclear etiology in this case. It is not aphasia. ?if could be related to acute ischemia seen on MRI.  ?- SLP evaluation pending ?- Esophagram Feb 2022 showed mild dysmotility without stricture or mass.  ? ?Tremor: Suspect this is due to CNS infection. Not consistent with seizure. ? ?NIDT2DM: HbA1c 6.5%.  ?- Continue moderate SSI, hold metformin with contrast exposures.  ? ?Obesity: Estimated body mass index is 34.91 kg/m? as calculated from the following: ?  Height as of this encounter: '5\' 6"'$  (1.676 m). ?  Weight as of this encounter: 98.1 kg. ? ?Subjective: Pt more responsive, still some confusion but not in significant pain. Denies trouble breathing, remains on HFNC. No chest pain. ? ?Objective: ?Vitals:  ? 04/29/22 0509  04/29/22 0524 04/29/22 0744 04/29/22 0855  ?BP:   130/83   ?Pulse: (!) 114 (!) 116 (!) 121   ?Resp: 19 (!) 22 (!) 22   ?Temp:   97.9 ?F (36.6 ?C)   ?TempSrc:   Oral   ?SpO2: 98% 98% 96% 96%  ?Weight:      ?Height:      ? ?Gen: 81 y.o. male in no distress ?Pulm: Nonlabored tachypnea at rest with bibasilar crackles ?CV: Regular tachycardia without murmur, rub, or gallop. No JVD, trace dependent edema. ?GI: Abdomen soft, tender diffusely without rebound or guarding non-distended. Normoactive bowel sounds. ?unclear if he's just reacting to being palpated as he also jumps when pressing on unrelated areas like chest wall.  ?Ext: Warm, no deformities ?Skin: No new rashes, lesions or ulcers on visualized skin. ?Neuro: Rousable and engaging, moves all extremities to command. When resting has decreased frequency of tremor x4 extremities. ?Psych: Calm, confused, UTD. ? ?Data Personally reviewed: ?CBC: ?Recent Labs  ?Lab 04/25/22 ?1104 04/25/22 ?1706 04/27/22 ?9030 04/28/22 ?0322  ?WBC 16.0*  --  16.8* 18.6*  ?NEUTROABS  --   --  13.8*  --   ?HGB 14.0 13.9 13.4 15.1  ?HCT 42.3 41.0 41.9 47.6  ?MCV 89.4  --  91.3 91.4  ?PLT 282  --  364 412*  ? ?Basic Metabolic Panel: ?Recent Labs  ?Lab 04/25/22 ?1104 04/25/22 ?1706 04/26/22 ?0245 04/27/22 ?0923 04/28/22 ?3007 04/29/22 ?6226  ?NA 134* 133* 135 139 141 148*  ?K 3.5 3.1*  3.6 3.7 3.7 3.9  ?CL 96*  --  94* 100 99 105  ?CO2 26  --  '29 30 30 29  '$ ?GLUCOSE 196*  --  143* 218* 213* 151*  ?BUN 20  --  17 24* 27* 41*  ?CREATININE 1.02  --  0.91 0.74 0.92 1.58*  ?CALCIUM 9.2  --  8.8* 9.5 9.7 9.3  ?MG  --   --  2.1 2.2 2.2  --   ? ?GFR: ?Estimated Creatinine Clearance: 42.2 mL/min (A) (by C-G formula based on SCr of 1.58 mg/dL (H)). ? ?HbA1C: ?No results for input(s): HGBA1C in the last 72 hours. ? ?CBG: ?Recent Labs  ?Lab 04/28/22 ?1201 04/28/22 ?1612 04/28/22 ?2017 04/29/22 ?0010 04/29/22 ?0743  ?GLUCAP 129* 169* 112* 155* 181*  ? ?Urine analysis: ?   ?Component Value Date/Time  ?  COLORURINE YELLOW 04/25/2022 1317  ? APPEARANCEUR CLEAR 04/25/2022 1317  ? LABSPEC 1.020 04/25/2022 1317  ? PHURINE 5.0 04/25/2022 1317  ? GLUCOSEU 100 (A) 04/25/2022 1317  ? HGBUR TRACE (A) 04/25/2022 1317  ?

## 2022-04-29 NOTE — Progress Notes (Signed)
Pharmacy Antibiotic Note ? ?[name] is a 81 y.o. male admitted on 04/25/2022 with respiratory failure d/t COPD and CHF, now w/ MRI concerning for meningitis.  Pharmacy has been consulted for vancomycin, ceftriaxone, and ampicillin dosing. ?-WBC= 20.6 ?-SCr= 2.35 (0.9 on 5/14) ?-blood cultures with 1/4 streptococcus anginosis - ? contaminant ? ?Plan: ?Hold vancomycin and check a level in am ?Continue Ceftriaxone 2g IV Q12H. ?Change Ampicillin to 2g IV Q8H. ?BMET in am ? ?Height: '5\' 6"'$  (167.6 cm) ?Weight: 98.1 kg (216 lb 4.3 oz) ?IBW/kg (Calculated) : 63.8 ? ?Temp (24hrs), Avg:98 ?F (36.7 ?C), Min:97.1 ?F (36.2 ?C), Max:98.7 ?F (37.1 ?C) ? ?Recent Labs  ?Lab 04/25/22 ?1104 04/25/22 ?1120 04/26/22 ?0245 04/27/22 ?7782 04/28/22 ?4235 04/29/22 ?3614 04/29/22 ?1418  ?WBC 16.0*  --   --  16.8* 18.6*  --  20.6*  ?CREATININE 1.02  --  0.91 0.74 0.92 1.58* 2.35*  ?LATICACIDVEN  --  1.6  --   --   --   --   --   ? ?  ?Estimated Creatinine Clearance: 28.4 mL/min (A) (by C-G formula based on SCr of 2.35 mg/dL (H)).   ? ?No Known Allergies ? ? ?Thank you for allowing pharmacy to be a part of this patient?s care. ? ?Wynona Neat, PharmD, BCPS  ?04/29/2022 5:29 PM ? ?

## 2022-04-29 NOTE — Progress Notes (Signed)
OT Cancellation Note ? ?Patient Details ?Name: CORDERRO KOLOSKI ?MRN: [id number] ?DOB: 07/14/1941 ? ? ?Cancelled Treatment:    Reason Eval/Treat Not Completed: Medical issues which prohibited therapy  Pt currently on BIpap and resting.  After discussion with nursing, will check back and try to see tomorrow based on medical condition.  ? ?Jeanmarie Mccowen OTR/L ?04/29/2022, 1:23 PM ?

## 2022-04-29 NOTE — Progress Notes (Signed)
? ?Progress Note ? ?Patient Name: [name] ?Date of Encounter: 04/29/2022 ? ?Primary Cardiologist:   Fransico Him, MD ? ? ?Subjective  ? ?Currently on BiPAP and arousable but sleepy.  Unable to assess for pain.  Intermittently drops his sats and has to be on BiPAP.  ? ?Inpatient Medications  ?  ?Scheduled Meds: ? aspirin EC  81 mg Oral Daily  ? dorzolamide  1 drop Both Eyes BID  ? feeding supplement  237 mL Oral TID BM  ? heparin injection (subcutaneous)  5,000 Units Subcutaneous Q8H  ? insulin aspart  0-15 Units Subcutaneous TID WC  ? insulin aspart  0-5 Units Subcutaneous QHS  ? mouth rinse  15 mL Mouth Rinse BID  ? methocarbamol  500 mg Oral TID  ? metoprolol succinate  75 mg Oral BID  ? metoprolol tartrate  25 mg Oral Once  ? mometasone-formoterol  2 puff Inhalation BID  ? multivitamin with minerals  1 tablet Oral Daily  ? sodium chloride flush  3 mL Intravenous Q12H  ? umeclidinium bromide  1 puff Inhalation Daily  ? ?Continuous Infusions: ? sodium chloride 500 mL (04/26/22 0006)  ? ampicillin (OMNIPEN) IV 2 g (04/29/22 0941)  ? cefTRIAXone (ROCEPHIN)  IV 2 g (04/29/22 0948)  ? vancomycin 1,250 mg (04/29/22 4401)  ? ?PRN Meds: ?sodium chloride, acetaminophen, levalbuterol, LORazepam, metoprolol tartrate, morphine injection, ondansetron (ZOFRAN) IV, oxyCODONE-acetaminophen, sodium chloride flush  ? ?Vital Signs  ?  ?Vitals:  ? 04/29/22 0524 04/29/22 0744 04/29/22 0855 04/29/22 1150  ?BP:  130/83  103/62  ?Pulse: (!) 116 (!) 121  (!) 113  ?Resp: (!) 22 (!) 22  (!) 23  ?Temp:  97.9 ?F (36.6 ?C)  (!) 97.3 ?F (36.3 ?C)  ?TempSrc:  Oral  Axillary  ?SpO2: 98% 96% 96% (!) 85%  ?Weight:      ?Height:      ? ? ?Intake/Output Summary (Last 24 hours) at 04/29/2022 1213 ?Last data filed at 04/29/2022 0454 ?Gross per 24 hour  ?Intake 1027.17 ml  ?Output --  ?Net 1027.17 ml  ? ?Filed Weights  ? 04/26/22 0406 04/27/22 0421 04/28/22 0500  ?Weight: 104.2 kg 103.1 kg 98.1 kg  ? ? ?Telemetry  ?  ?ST ? ?ECG  ?  ?NA - Personally  Reviewed ? ?Physical Exam  ? ?GEN:   Acutely ill appearing but not in acute distress ?Neck: No  JVD ?Cardiac: RRR, no murmurs, rubs, or gallops.  ?Respiratory:      Decreased breath, transmitted upper airway sounds. ?GI: Soft, nontender, non-distended  ?MS: No  edema; No deformity. ?Neuro:  Nonfocal, opens eyes and tries to respond ? ? ?Labs  ?  ?Chemistry ?Recent Labs  ?Lab 04/27/22 ?0412 04/28/22 ?0322 04/28/22 ?0654 04/29/22 ?0272  ?NA 139 141  --  148*  ?K 3.7 3.7  --  3.9  ?CL 100 99  --  105  ?CO2 30 30  --  29  ?GLUCOSE 218* 213*  --  151*  ?BUN 24* 27*  --  41*  ?CREATININE 0.74 0.92  --  1.58*  ?CALCIUM 9.5 9.7  --  9.3  ?PROT  --   --  6.4*  --   ?ALBUMIN  --   --  2.4*  --   ?AST  --   --  11*  --   ?ALT  --   --  12  --   ?ALKPHOS  --   --  103  --   ?BILITOT  --   --  0.7  --   ?GFRNONAA >60 >60  --  44*  ?ANIONGAP 9 12  --  14  ?  ? ?Hematology ?Recent Labs  ?Lab 04/25/22 ?1104 04/25/22 ?1706 04/27/22 ?3009 04/28/22 ?0322  ?WBC 16.0*  --  16.8* 18.6*  ?RBC 4.73  --  4.59 5.21  ?HGB 14.0 13.9 13.4 15.1  ?HCT 42.3 41.0 41.9 47.6  ?MCV 89.4  --  91.3 91.4  ?MCH 29.6  --  29.2 29.0  ?MCHC 33.1  --  32.0 31.7  ?RDW 13.7  --  13.3 13.2  ?PLT 282  --  364 412*  ? ? ?Cardiac EnzymesNo results for input(s): TROPONINI in the last 168 hours. No results for input(s): TROPIPOC in the last 168 hours.  ? ?BNP ?Recent Labs  ?Lab 04/25/22 ?1104  ?BNP 540.9*  ?  ? ?DDimer  ?Recent Labs  ?Lab 04/26/22 ?0245  ?DDIMER 2.96*  ?  ? ?Radiology  ?  ?CT ANGIO HEAD NECK W WO CM ? ?Result Date: 04/28/2022 ?CLINICAL DATA:  Subarachnoid hemorrhage follow-up. EXAM: CT ANGIOGRAPHY HEAD AND NECK TECHNIQUE: Multidetector CT imaging of the head and neck was performed using the standard protocol during bolus administration of intravenous contrast. Multiplanar CT image reconstructions and MIPs were obtained to evaluate the vascular anatomy. Carotid stenosis measurements (when applicable) are obtained utilizing NASCET criteria, using the  distal internal carotid diameter as the denominator. RADIATION DOSE REDUCTION: This exam was performed according to the departmental dose-optimization program which includes automated exposure control, adjustment of the mA and/or kV according to patient size and/or use of iterative reconstruction technique. CONTRAST:  61m OMNIPAQUE IOHEXOL 350 MG/ML SOLN COMPARISON:  04/25/2022 FINDINGS: CTA NECK FINDINGS Aortic arch: Atheromatous plaque which is extensive. Two vessel branching. Right carotid system: Diffuse atheromatous plaque affecting the common carotid and proximal ICA. No flow limiting stenosis, beading, or ulceration. Left carotid system: Diffuse atheromatous plaque especially affecting the common and proximal internal carotid arteries. No flow limiting stenosis or ulceration. Vertebral arteries: Proximal subclavian atherosclerosis on both sides. The left vertebral artery is dominant. Proximal left vertebral artery kink from tortuosity. Diminutive right vertebral artery which is not seen proximally but is flowing from muscular branches of the distal V2 segment. Skeleton: No acute osseous finding or aggressive bone lesion. Ventral spinal canal gas collection at C3-4 and to a smaller extent at C4-5, usually herniation containing vacuum phenomenon. 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 Upper chest: Emphysema. Pulmonary opacity better assessed on recent chest CT. Review of the MIP images confirms the above findings CTA HEAD FINDINGS Anterior circulation: Extensive atheromatous plaque along the carotid siphons. 50% narrowing at the right anterior genu. No branch occlusion, beading, or aneurysm. Posterior circulation: Strong left vertebral artery dominance with minimal contribution of the right vertebral artery to the basilar. The vertebral and basilar arteries are diffusely patent. No PCA branch occlusion, beading, or aneurysm Venous sinuses: Diffusely patent Anatomic variants: As above Review of the MIP  images confirms the above findings IMPRESSION: 1. No emergent finding. No aneurysm or vascular malformation as a cause of subarachnoid findings. 2. No flow seen in the non dominant proximal right vertebral artery with distal reconstitution. 3. Diffuse atherosclerosis with up to 50% narrowing of the right intracranial ICA. 4. Gas in the ventral canal at C3-4 without evidence of underlying acute osseous disease, usually gas containing herniation. 5. 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 Electronically Signed   By: JJorje GuildM.D.   On: 04/28/2022 08:51  ? ?CT Angio Chest Pulmonary Embolism (  PE) W or WO Contrast ? ?Result Date: 04/27/2022 ?CLINICAL DATA:  Positive D-dimer. Clinical suspicion for pulmonary embolus. EXAM: CT ANGIOGRAPHY CHEST WITH CONTRAST TECHNIQUE: Multidetector CT imaging of the chest was performed using the standard protocol during bolus administration of intravenous contrast. Multiplanar CT image reconstructions and MIPs were obtained to evaluate the vascular anatomy. RADIATION DOSE REDUCTION: This exam was performed according to the departmental dose-optimization program which includes automated exposure control, adjustment of the mA and/or kV according to patient size and/or use of iterative reconstruction technique. CONTRAST:  68m OMNIPAQUE IOHEXOL 350 MG/ML SOLN COMPARISON:  04/25/2022 standard chest CT. FINDINGS: Cardiovascular: Heart is enlarged. No substantial pericardial effusion. Coronary artery calcification is evident. Status post CABG. Moderate atherosclerotic calcification is noted in the wall of the thoracic aorta. There is no filling defect within the opacified pulmonary arteries to suggest the presence of an acute pulmonary embolus. Mediastinum/Nodes: No mediastinal lymphadenopathy. There is no hilar lymphadenopathy. The esophagus has normal imaging features. There is no axillary lymphadenopathy. Lungs/Pleura: Centrilobular emphsyema noted. Bilateral dependent lower lobe  collapse/consolidation, left slightly more than right. Interlobular septal thickening noted in the upper lobes bilaterally no substantial pleural effusion. Upper Abdomen: Layering tiny calcified gallstones evident. N

## 2022-04-29 NOTE — Progress Notes (Signed)
PT Cancellation Note ? ?Patient Details ?Name: [name] ?MRN: [id number] ?DOB: Jan 14, 1941 ? ? ?Cancelled Treatment:    Reason Eval/Treat Not Completed: Patient declined, no reason specified (pt sleeping after medication and wife request defer waking and attempting at this time) ? ? ?[name] ?04/29/2022, 11:08 AM ?Shalimar Mcclain P, PT ?Acute Rehabilitation Services ?Pager: [phone] ?Office: [phone] ? ?

## 2022-04-29 NOTE — Consult Note (Signed)
?   ? ? ? ? ?[facility] for Infectious Disease   ? ?Date of Admission:  04/25/2022    ? ?Reason for Consult: meningitis community acquired    ?Referring Provider: Bonner Puna ? ? ? ? ?Abx: ?5/14-c vanc ?5/14-c amp ?5/11-c ceftriaxone ? ?5/11-14 azith      ? ? 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 ? ? ?Initial chest CTA no PE or opacity ?He had ct had and subsequent mri (ordered for complaint of neck pain) that showed periventricular white matter 31m hyperdense focus and layering within the occipital horns of lateral ventricles suggesting SAH vs meningitis.  ? ?Neurology evaluated and performed LP 3 days after he has been on CAP abx treatment. 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 ?5/14 LP -- hazy; wbc 1300; rbc 90; neutrophilic 863% protein >>846 glucose<20. I do not see reported xanthochromia and his presentation is not consistent with SAH ? ?Imaging does suggest ventriculitis as well with layering density in the ventricles ? ?He was started on meningitis empiric tx with the LP result and imaging/presentation ? ?I suspect either strep pna or h-influenza ?There was question of PEP in setting of meningitis. As we don't know if this is meningococcal process, and suspect is for strep pna or h-influenza, I would defer PEP for employees at this time ? ?I have involved IP as well and they'll further decide. PEP timing would be fine up to 10 days of exposure ? ?Of note, he has strep anginosus in 1 of 4 bottles this admission. Will see if the csf cx grows anything. Strep angi causes abscess commonly and the mri presentation is rather atypical. I am not sure what to make of it at  this time -- he is on appropriate coverage abx for it as well ? ? ? ? ?Of note, his abd/pelv ct portion showed presacral mass like density and adrenal mass. Primary team awared and ponder work up ? ?Plan: ?Continue current antimicrobial; agree no indication for hsv coverage ?I anticipate long iv abx tx with concern for pyogenic ventriculitis ?F/u culture ?Discussed with primary team/IP ?Primary team will also perform c-spine mri for further clarification of the pain and r/o potential involvement  ?Rest of management per primary team ? ? ?I spent 80 minute reviewing data/chart, and coordinating care and >50% direct face to face time providing counseling/discussing diagnostics/treatment plan with patient ? ? ?------------------------------------------------ ?Principal Problem: ?  Acute on chronic respiratory failure with hypoxia ([place] ?Active Problems: ?  COPD with acute exacerbation ([place] ?  Diabetes mellitus type II, non insulin dependent ([place] ?  Acute on chronic systolic (congestive) heart failure ([place] ?  Presacral mass ?  Adrenal mass ([place] ?  Neck mass ?  Tremor ? ? ? ?HPI: Sean CANNEDYis a 81y.o. [name] [name] copd on home 2 l o2, dysphagia, HFrEF, dm2, cad s/p cabg, djd with cervical neck pain, admitted 5/11 initially with chest pain/dyspnea and acute on chronic hypoxic resp failure initially treated for CAP, but subsequently found to have ventriculitis/meningitis ? ?Reviewed chart/discussed with patient ? ?Patient initially admitted for a few days of intermittent confusion. Acute on chronic dyspnea. Has stable ble edema and  orthopnea ?Other sx includes nausea ? ?Afebrile on admission but has moderate leukocytosis ?Abd/pelvl/Chest cta no pe or pna but initially started/continued on CAP tx ?Bcx with 1 of 4 bottles with strep anginosus ?Primary team discussing with ir regarding presacral mass biopsy. Adrenal mass also noted ? ?He subsequently developed tremors during admission. He had initial ct head for  confusion that showed a small hyperdense focus in right periventricular white matter so underwent mri brain on HD #3 that showed SAH vs meningitis/ventriculitis ? ?Neurology consulted and performed mri 5/14 that showed low glucose, high protein, and severely elevated neutrophilic predominant pleocytosis ? ?He appears to remain still confused and with generalized weakness. And had developed worsening hypoxia requiring hfnc and bipap since 5/14 ? ?Other event include mildly elevated troponin for which cardiology is following. Ekg suggests st elevation but cardiology do not suspect acute ischemic event, but rather demand ischemia. Also has aki ? ?His wbc is slightly worse ?His csf cx is ngtd - pcr sent out is in progress ?Abx was changed to meningitis empiric coverage without acyclovir component ?He remains afebrile, but overally worsening per his family in terms of confusion, aki, and hypophonia/dysphagia ? ?Family also reports worsening posterior neck pain and lower back spasm for the past 2 weeks. He is getting pain meds here ? ? ?Family History  ?Problem Relation Age of Onset  ? Hypertension Father   ? Breast cancer Father   ? CVA Father   ? Diabetes Mother   ? Hypertension Mother   ? CAD Sister   ? Hypertension Sister   ? Diabetes Sister   ? Heart disease Brother   ? Diabetes Brother   ? Breast cancer Sister   ? Hypertension Sister   ? Diabetes Sister   ? Heart disease Sister   ? ? ?Social History  ? ?Tobacco Use  ? Smoking status: Former  ?  Types: Cigarettes  ?  Quit date: 10/25/2001  ?  Years since quitting: 20.5  ? Smokeless tobacco: Never  ?Vaping Use  ? Vaping Use: Never used  ?Substance Use Topics  ? Alcohol use: Yes  ?  Comment: occasional beer and wine  ? Drug use: No  ? ? ?No Known Allergies ? ?Review of Systems: ?ROS ?All Other ROS was negative, except mentioned above; limited due to AMS ? ? ?Past Medical History:  ?Diagnosis Date  ? Anxiety   ? CAD (coronary artery disease)   ?  s/p CABG with LIMA to  LAD, SVG to diag, SVG to OM, SVG to RCA cath 2002 Dr. Radford Pax  ? CAP (community acquired pneumonia) due to Pneumococcus ([place]) 03/03/2017  ? Cataract   ? Chronic combined systolic and diastolic CHF (congestive heart failure) ([place]) 04/28/2018  ? Chronic kidney disease   ? Claud[facility])   ? normal ABIs  ? COPD (chronic obstructive pulmonary disease) ([place])   ? Depression   ? Diabetes mellitus without complication ([place])   ? Dyslipidemia   ? Hypersomnia   ? Hypertension   ? 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 EF 40% with apical AK at cath intolerant to ACE I and ARBS, EF 43% by nuclear stress test 08/2010  ? Kidney stones   ? Macular degeneration   ? Morbid obesity ([place]) 04/28/2018  ? ? ? ? ? ?Scheduled Meds: ? dorzolamide  1 drop Both Eyes BID  ? heparin injection (subcutaneous)  5,000 Units Subcutaneous Q8H  ? insulin aspart  0-15 Units Subcutaneous TID WC  ?  insulin aspart  0-5 Units Subcutaneous QHS  ? metoprolol tartrate  5 mg Intravenous Q6H  ? mometasone-formoterol  2 puff Inhalation BID  ? sodium chloride flush  3 mL Intravenous Q12H  ? umeclidinium bromide  1 puff Inhalation Daily  ? vancomycin variable dose per unstable renal function (pharmacist dosing)   Does not apply See admin instructions  ? ?Continuous Infusions: ? sodium chloride 100 mL/hr at 04/29/22 1818  ? sodium chloride 500 mL (04/26/22 0006)  ? ampicillin (OMNIPEN) IV    ? cefTRIAXone (ROCEPHIN)  IV 2 g (04/29/22 0948)  ? ?PRN Meds:.sodium chloride, acetaminophen, HYDROmorphone (DILAUDID) injection, levalbuterol, LORazepam, ondansetron (ZOFRAN) IV, sodium chloride flush ? ? ?OBJECTIVE: ?Blood pressure 94/62, pulse (!) 102, temperature (!) 97.1 ?F (36.2 ?C), temperature source Axillary, resp. rate 18, height '5\' 6"'$  (1.676 m), weight 98.1 kg, SpO2 92 %. ? ?Physical Exam ?General/constitutional: no distress, sleeping, but aroused easily and tracking but not making voice or conversant, and not following complex commands ?somnolent ?HEENT:  Normocephalic, PER, Conj Clear, EOMI, Oropharynx clear ?Neck supple ?CV: rrr no mrg ?Lungs: clear to auscultation, normal respiratory effort ?Abd: Soft, Nontender ?Ext: no edema ?Skin: old atrophic hypopigmente

## 2022-04-29 NOTE — Progress Notes (Signed)
?   04/28/22 2340  ?Assess: MEWS Score  ?Temp 97.9 ?F (36.6 ?C)  ?BP (!) 142/84  ?Pulse Rate (!) 137  ?ECG Heart Rate (!) 130  ?Resp (!) 29  ?Level of Consciousness Responds to Voice  ?SpO2 95 %  ?O2 Device HFNC  ?O2 Flow Rate (L/min) 15 L/min  ?Assess: MEWS Score  ?MEWS Temp 0  ?MEWS Systolic 0  ?MEWS Pulse 3  ?MEWS RR 2  ?MEWS LOC 1  ?MEWS Score 6  ?MEWS Score Color Red  ?Assess: if the MEWS score is Yellow or Red  ?Were vital signs taken at a resting state? Yes  ?Focused Assessment No change from prior assessment  ?Early Detection of Sepsis Score *See Row Information* High  ?MEWS guidelines implemented *See Row Information* No, previously red, continue vital signs every 4 hours  ?Treat  ?Pain Score 0  ?Escalate  ?MEWS: Escalate Red: discuss with charge nurse/RN and provider, consider discussing with RRT ?(will give PRN Lopressor for HR)  ?Notify: Charge Nurse/RN  ?Name of Charge Nurse/RN Notified Sharee Pimple RN  ?Date Charge Nurse/RN Notified 04/28/22  ?Time Charge Nurse/RN Notified 2330  ?Document  ?Patient Outcome Other (Comment) ?(will recheck vitals after med given)  ? ? ?

## 2022-04-29 NOTE — Progress Notes (Signed)
Patient taken off BIPAP by RN and placed back on HFNC [place] at 15L. Patient resting comfortably and vitals stable.  ?

## 2022-04-29 NOTE — TOC Progression Note (Signed)
Transition of Care (TOC) - Progression Note  ? ? ?Patient Details  ?Name: DESMEN SCHOFFSTALL ?MRN: [id number] ?Date of Birth: [birth year] ? ?Transition of Care (TOC) CM/SW Contact  ?Milas Gain, LCSWA ?Phone Number: ?04/29/2022, 4:14 PM ? ?Clinical Narrative:    ? ?CSW spoke with patients spouse regarding SNF bed offers. Patients spouse number one choice for SNF placement for patient is [facility] and rehab. CSW spoke with Nicki at [facility] who informed CSW to check back in with her closer to patient being medically ready for dc to see if they can offer a SNF bed for patient. CSW informed patients spouse. CSW will continue to follow and check back in with Nicki with [facility] closer to patient being medically ready for dc.CSW will start insurance authorization closer to patient being medically ready for dc. CSW will continue to follow and assist with patients dc planning needs. ? ?Expected Discharge Plan: [place] ?Barriers to Discharge: Continued Medical Work up ? ?Expected Discharge Plan and Services ?Expected Discharge Plan: [place] ?In-house Referral: Clinical Social Work ?  ?  ?Living arrangements for the past 2 months: [place] ?                ?  ?  ?  ?  ?  ?  ?  ?  ?  ?  ? ? ?Social Determinants of Health (SDOH) Interventions ?  ? ?Readmission Risk Interventions ?   ? View : No data to display.  ?  ?  ?  ? ? ?

## 2022-04-29 NOTE — Progress Notes (Signed)
Provider on call Dr Marlowe Sax notified HR sustaining 130's. Pt does have PRN IV Metoprolol PRN Q6 ordered-not time for next dose. BP 127/89. Orders received. Jessie Foot  ?

## 2022-04-30 ENCOUNTER — Encounter (INDEPENDENT_AMBULATORY_CARE_PROVIDER_SITE_OTHER): Payer: Medicare Other | Admitting: Ophthalmology

## 2022-04-30 DIAGNOSIS — J3801 Paralysis of vocal cords and larynx, unilateral: Secondary | ICD-10-CM

## 2022-04-30 DIAGNOSIS — R490 Dysphonia: Secondary | ICD-10-CM

## 2022-04-30 DIAGNOSIS — I10 Essential (primary) hypertension: Secondary | ICD-10-CM | POA: Diagnosis not present

## 2022-04-30 DIAGNOSIS — R6520 Severe sepsis without septic shock: Secondary | ICD-10-CM

## 2022-04-30 DIAGNOSIS — R7989 Other specified abnormal findings of blood chemistry: Secondary | ICD-10-CM | POA: Diagnosis not present

## 2022-04-30 DIAGNOSIS — E278 Other specified disorders of adrenal gland: Secondary | ICD-10-CM | POA: Diagnosis not present

## 2022-04-30 DIAGNOSIS — J9621 Acute and chronic respiratory failure with hypoxia: Secondary | ICD-10-CM | POA: Diagnosis not present

## 2022-04-30 DIAGNOSIS — I5023 Acute on chronic systolic (congestive) heart failure: Secondary | ICD-10-CM | POA: Diagnosis not present

## 2022-04-30 DIAGNOSIS — R000 Tachycardia, unspecified: Secondary | ICD-10-CM | POA: Diagnosis not present

## 2022-04-30 DIAGNOSIS — J9601 Acute respiratory failure with hypoxia: Secondary | ICD-10-CM | POA: Diagnosis not present

## 2022-04-30 DIAGNOSIS — Z87891 Personal history of nicotine dependence: Secondary | ICD-10-CM

## 2022-04-30 DIAGNOSIS — A419 Sepsis, unspecified organism: Secondary | ICD-10-CM

## 2022-04-30 DIAGNOSIS — G039 Meningitis, unspecified: Secondary | ICD-10-CM | POA: Diagnosis not present

## 2022-04-30 DIAGNOSIS — J441 Chronic obstructive pulmonary disease with (acute) exacerbation: Secondary | ICD-10-CM | POA: Diagnosis not present

## 2022-04-30 LAB — METANEPHRINES, PLASMA
Metanephrine, Free: 62.8 pg/mL (ref 0.0–88.0)
Normetanephrine, Free: 92.9 pg/mL (ref 0.0–285.2)

## 2022-04-30 LAB — BASIC METABOLIC PANEL
Anion gap: 15 (ref 5–15)
Anion gap: 11 (ref 5–15)
BUN: 73 mg/dL — ABNORMAL HIGH (ref 8–23)
BUN: 74 mg/dL — ABNORMAL HIGH (ref 8–23)
CO2: 27 mmol/L (ref 22–32)
CO2: 29 mmol/L (ref 22–32)
Calcium: 8.6 mg/dL — ABNORMAL LOW (ref 8.9–10.3)
Calcium: 8.3 mg/dL — ABNORMAL LOW (ref 8.9–10.3)
Chloride: 107 mmol/L (ref 98–111)
Chloride: 112 mmol/L — ABNORMAL HIGH (ref 98–111)
Creatinine, Ser: 3.28 mg/dL — ABNORMAL HIGH (ref 0.61–1.24)
Creatinine, Ser: 2.86 mg/dL — ABNORMAL HIGH (ref 0.61–1.24)
GFR, Estimated: 19 mL/min — ABNORMAL LOW (ref >60)
GFR, Estimated: 22 mL/min — ABNORMAL LOW (ref >60)
Glucose, Bld: 163 mg/dL — ABNORMAL HIGH (ref 70–99)
Glucose, Bld: 123 mg/dL — ABNORMAL HIGH (ref 70–99)
Potassium: 3.7 mmol/L (ref 3.5–5.1)
Potassium: 3.6 mmol/L (ref 3.5–5.1)
Sodium: 149 mmol/L — ABNORMAL HIGH (ref 135–145)
Sodium: 152 mmol/L — ABNORMAL HIGH (ref 135–145)

## 2022-04-30 LAB — CULTURE, BLOOD (ROUTINE X 2)
Culture: NO GROWTH
Special Requests: ADEQUATE

## 2022-04-30 LAB — URINALYSIS, COMPLETE (UACMP) WITH MICROSCOPIC
Bilirubin Urine: NEGATIVE
Glucose, UA: NEGATIVE mg/dL
Ketones, ur: NEGATIVE mg/dL
Leukocytes,Ua: NEGATIVE
Nitrite: NEGATIVE
Protein, ur: NEGATIVE mg/dL
Specific Gravity, Urine: 1.025 (ref 1.005–1.030)
pH: 5 (ref 5.0–8.0)

## 2022-04-30 LAB — SODIUM, URINE, RANDOM: Sodium, Ur: 86 mmol/L

## 2022-04-30 LAB — HSV 1/2 PCR, CSF
HSV-1 DNA: NEGATIVE
HSV-2 DNA: NEGATIVE

## 2022-04-30 LAB — PATHOLOGIST SMEAR REVIEW

## 2022-04-30 LAB — PROTEIN / CREATININE RATIO, URINE
Creatinine, Urine: 84.03 mg/dL
Protein Creatinine Ratio: 0.75 mg/mg{Cre} — ABNORMAL HIGH (ref 0.00–0.15)
Total Protein, Urine: 63 mg/dL

## 2022-04-30 LAB — MISC LABCORP TEST (SEND OUT): Labcorp test code: 2013305

## 2022-04-30 LAB — GLUCOSE, CAPILLARY
Glucose-Capillary: 122 mg/dL — ABNORMAL HIGH (ref 70–99)
Glucose-Capillary: 133 mg/dL — ABNORMAL HIGH (ref 70–99)
Glucose-Capillary: 146 mg/dL — ABNORMAL HIGH (ref 70–99)
Glucose-Capillary: 155 mg/dL — ABNORMAL HIGH (ref 70–99)
Glucose-Capillary: 155 mg/dL — ABNORMAL HIGH (ref 70–99)

## 2022-04-30 LAB — VANCOMYCIN, RANDOM: Vancomycin Rm: 38

## 2022-04-30 LAB — STREP PNEUMONIAE URINARY ANTIGEN: Strep Pneumo Urinary Antigen: NEGATIVE

## 2022-04-30 LAB — CYTOLOGY - NON PAP

## 2022-04-30 MED ORDER — CHLORHEXIDINE GLUCONATE CLOTH 2 % EX PADS
6.0000 | MEDICATED_PAD | Freq: Every day | CUTANEOUS | Status: DC
  Administered 2022-05-01 – 2022-05-07 (×7): 6 via TOPICAL

## 2022-04-30 MED ORDER — SODIUM CHLORIDE 0.9 % IV BOLUS
500.0000 mL | Freq: Once | INTRAVENOUS | Status: AC
  Administered 2022-04-30: 500 mL via INTRAVENOUS

## 2022-04-30 MED ORDER — DEXTROSE 5 % IV SOLN
INTRAVENOUS | Status: DC
  Administered 2022-04-30: 1000 mL via INTRAVENOUS

## 2022-04-30 MED ORDER — SODIUM CHLORIDE 0.45 % IV SOLN: INTRAVENOUS | Status: DC

## 2022-04-30 MED ORDER — SODIUM CHLORIDE 0.45 % IV BOLUS: 500.0000 mL | Freq: Once | INTRAVENOUS | Status: DC

## 2022-04-30 NOTE — Plan of Care (Signed)
  Problem: Education: Goal: Knowledge of General Education information will improve Description Including pain rating scale, medication(s)/side effects and non-pharmacologic comfort measures Outcome: Progressing   

## 2022-04-30 NOTE — Progress Notes (Addendum)
?Neurology Progress Note   ? ?Reason for Initial Consult 5/14 early AM , "MRI concerning for [facility] vs meningitis" ? ?HISTORY OF PRESENT ILLNESS   ?HPI  ?[name] is a 81 y.o. male with PMH significant for COPD, HFrEF, DM2, CAD s/p CABG who presented with orthopnea and severe dyspnea on exertion, intermittent confusion over the last few days and new onset BL upper extremity tremors. ?  ?Workup in the ED with CXR concerning for a pneumonia, he was put on Bipap  an supplemental oxygen for hypoxia. He had additional workup with CTA/P with concern for a R adrenal mass concerning for a malignancy, CT Chest with concern for pneumonia. CTH for confusion demonstrated 57m hypderdense focus in R periventricular white matter. Further workup with MRI Brain that he was barely able to tolerate jsut the diffusion and FLAIR imaging demonstrated small volume diffusion signal abnormality layering within the occipital horns of both lateral ventricles as well as the left Sylvian fissure. ? ?CSF results concerning for bacterial meningitis (elevated protein, low glucose, 90 RBCs, 1300 WBCs) ffor which he is treated under care of primary tea + ID with antibiotics.  ? ?--------- ?24 hour course:  ?-CSF culture with no growth x 2 days, only presence WBCs; urine culture no growth to date 5/11 ;blood cultures no growth to date , drawn 5/11 ?-mental status continues to improve, as does action myoclonus ?-episode of "jerking all over" during sleep yesterday per patient's wife, but she states there has been no recurrence and that his involuntary movements while awake have also improved and that he is more alert today with which author agrees  ?-MRI brain and cervical spine and brain with and without contrast ordered  ? ?Premorbid modified Rankin scale (mRS):  ?0 ?Current 4  ? ?ROS: Full ROS was performed and is negative except as noted in the HPI., albeit quite limited due to alteration in mental status.   ? ?PAST MEDICAL HISTORY   ? ?Past  Medical History:  ?Past Medical History:  ?Diagnosis Date  ? Anxiety   ? CAD (coronary artery disease)   ?  s/p CABG with LIMA to LAD, SVG to diag, SVG to OM, SVG to RCA cath 2002 Dr. TRadford Pax ? CAP (community acquired pneumonia) due to Pneumococcus ([place] 03/03/2017  ? Cataract   ? 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 Chronic kidney disease   ? [facility]ication ([facility]   ? normal ABIs  ? COPD [place]chronic obstructive pulmonary disease) ([place]   ? Depression   ? Diabetes mellitus without complication ([facility]   ? Dyslipidemia   ? Hypersomnia   ? Hypertension   ? 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[facility]   ? EF 40% with apical AK at cath intolerant to ACE I and ARBS, EF 43% by nuclear stress test 08/2010  ? Kidney stones   ? Macular degeneration   ? Morbid obesity ([place] 04/28/2018  ? ? ?No family history on file. ?Family History  ?Problem Relation Age of Onset  ? Hypertension Father   ? Breast cancer Father   ? CVA Father   ? Diabetes Mother   ? Hypertension Mother   ? CAD Sister   ? Hypertension Sister   ? Diabetes Sister   ? Heart disease Brother   ? Diabetes Brother   ? Breast cancer Sister   ? Hypertension Sister   ? Diabetes Sister   ? Heart disease Sister   ? ? ?Allergies:  ?No Known  Allergies ? ?Social History:  ? reports that he quit smoking about 20 years ago. His smoking use included cigarettes. He has never used smokeless tobacco. He reports current alcohol use. He reports that he does not use drugs.   ? ?Medications ?Medications Prior to Admission  ?Medication Sig Dispense Refill  ? acetaminophen (TYLENOL) 500 MG tablet Take 100 mg by mouth every 8 (eight) hours as needed for mild pain or fever.    ? albuterol (PROVENTIL HFA;VENTOLIN HFA) 108 (90 Base) MCG/ACT inhaler Inhale 1-2 puffs into the lungs every 6 (six) hours as needed for wheezing or shortness of breath. (Patient taking differently: Inhale 2 puffs into the lungs every 4 (four) hours as needed for wheezing or  shortness of breath.) 1 Inhaler 1  ? amLODipine (NORVASC) 10 MG tablet Take 10 mg by mouth daily.    ? atorvastatin (LIPITOR) 40 MG tablet Take 1 tablet (40 mg total) by mouth daily. 90 tablet 3  ? carbamide peroxide (DEBROX) 6.5 % OTIC solution Place 5 drops into both ears daily as needed (wax buildup).    ? dorzolamide (TRUSOPT) 2 % ophthalmic solution Place 1 drop into both eyes 2 (two) times daily.     ? furosemide (LASIX) 40 MG tablet Take 40-80 mg by mouth daily as needed for edema or fluid.  5  ? Multiple Vitamin (MULTIVITAMIN) capsule Take 1 capsule by mouth daily.    ? metoprolol succinate (TOPROL-XL) 25 MG 24 hr tablet TAKE 1 TABLET BY MOUTH ONCE DAILY (Patient taking differently: Take 25 mg by mouth daily.) 15 tablet 0  ? ? ?EXAMINATION   ? ?Current vital signs: ? ?  04/30/2022  ?  8:47 AM 04/30/2022  ?  8:30 AM 04/30/2022  ?  5:40 AM  ?Vitals with BMI  ?Systolic  458 099  ?Diastolic  77 78  ?Pulse 140 113 109  ? ?Examination:  ?GENERAL: arousable to verbal stimulation, NAD, intermittent myoclonic jerks of both upper extremities but much less severe and less frequent than past 2 days and only seen with action in both Ues today, none in [place]  ?HEENT: - Normocephalic and atraumatic, dry mm, no lymphadenopathy, no Thyromegally ?LUNGS - Clear to auscultation bilaterally ?CV - S1S2 RRR, equal pulses bilaterally. ?ABDOMEN - Soft, nontender, nondistended with normoactive BS ?Ext: warm, well perfused, intact peripheral pulses, no pedal edema ? ?NEURO:  ?Mental Status: AA&Ox3 to person , time , and place today  ?Language: speech is hypophonic.  intact naming, repetition, fluency, and comprehension. Follows simple commands, but complex commands only inconsistently, clearly impaired attention span  ?Cranial Nerves:  ?II: PERRL. Blinks to threat throughout ?III, IV, VI: EOM in tact. Eyelids elevate symmetrically ?V: Sensation intact V1-3 symmetrically  ?VII: no facial asymmetry   ?VIII: hearing intact to voice ?IX, X:  Hypophonic ?IP:[id number] shrug 5/5 and symmetrical  ?XII: tongue is midline without fasciculations. ?Motor: 5/5 throughout ?Tone is normal and bulk is normal ?DTRs: 2+ and symmetrical Ues, perhaps brisk both Les   ?Sensation- intact  ?Coordination perhaps mild BL UE ataxia on finger to nose (past pointing) ?Gait- deferred ? ?LABS  ?I have reviewed labs in epic and the results pertinent to this consultation are: ? ?Lab Results  ?Component Value Date  ? LDLCALC 72 03/05/2022  ? ?Lab Results  ?Component Value Date  ? ALT 11 04/29/2022  ? AST 13 (L) 04/29/2022  ? ALKPHOS 96 04/29/2022  ? BILITOT 0.7 04/29/2022  ? ?Lab Results  ?Component Value Date  ?  HGBA1C 6.5 (H) 04/26/2022  ? ?Lab Results  ?Component Value Date  ? WBC 20.6 (H) 04/29/2022  ? HGB 14.8 04/29/2022  ? HCT 46.7 04/29/2022  ? MCV 92.5 04/29/2022  ? PLT 429 (H) 04/29/2022  ? ?No results found for: VITAMINB12 ?No results found for: FOLATE ?Lab Results  ?Component Value Date  ? NA 149 (H) 04/30/2022  ? K 3.7 04/30/2022  ? CL 107 04/30/2022  ? CO2 27 04/30/2022  ? ?DIAGNOSTIC IMAGING/PROCEDURES  ? ?I have reviewed the images obtained:, as below   ? ?CT-head  ?1. 2 mm hyperdense focus in the right parietal periventricular white ?matter, indeterminate for a tiny metastasis, focus of calcification, ?punctate hemorrhage, or vascular structure with assessment limited ?by residual contrast from today's earlier body CT. Head MRI without ?and with contrast is recommended for further evaluation. ?2. 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 ? ?CTA head and neck  ?1. No emergent finding. No aneurysm or vascular malformation as a ?cause of subarachnoid findings. ?2. No flow seen in the non dominant proximal right vertebral artery ?with distal reconstitution. ?3. Diffuse atherosclerosis with up to 50% narrowing of the right ?intracranial ICA. ?4. Gas in the ventral canal at C3-4 without evidence of underlying ?acute osseous disease, usually gas containing  herniation. ?5. 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 ? ?MRI brain ?1. Technically limited exam due to patient's inability to tolerate ?the full length of the study and extensive motion artifact. ?2. Abnormal small v

## 2022-04-30 NOTE — Progress Notes (Signed)
Physical Therapy Treatment ?Patient Details ?Name: [name] ?MRN: [id number] ?DOB: 12-22-1940 ?Today's Date: 04/30/2022 ? ? ?History of Present Illness 81 y.o. male adm 5/11 with edema and orthopnea with acute on chronic CHF and COPD. 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 5/13 MRI with small SAH vs meningitis. 5/14 respiratory distress and LP with bacterial meningitis. PMhx: COPD on O2, HFrEF, DM2, CAD s/p CABG, depression, macular degeneration, HTN, HLD ? ?  ?PT Comments  ? ? Pt pleasantly confused with decreased pain from prior session. Pt able to maintain sats 92-98% on 8L during session with max HR 142 with activity and return to 115bpm at rest. Pt limited by cognitive deficits and weakness. Pt OOB to chair with wife present throughout session. Educated for HEP and progression. Will continue to follow.  ?   ?Recommendations for follow up therapy are one component of a multi-disciplinary discharge planning process, led by the attending physician.  Recommendations may be updated based on patient status, additional functional criteria and insurance authorization. ? ?Follow Up Recommendations ? Skilled nursing-short term rehab (<3 hours/day) ?  ?  ?Assistance Recommended at Discharge Frequent or constant Supervision/Assistance  ?Patient can return home with the following Assistance with cooking/housework;Assist for transportation;A lot of help with walking and/or transfers;Help with stairs or ramp for entrance;A lot of help with bathing/dressing/bathroom ?  ?Equipment Recommendations ? Rolling walker (2 wheels);BSC/3in1  ?  ?Recommendations for Other Services   ? ? ?  ?Precautions / Restrictions Precautions ?Precautions: Fall;Other (comment) ?Precaution Comments: watch HR and sats  ?  ? ?Mobility ? Bed Mobility ?Overal bed mobility: Needs Assistance ?Bed Mobility: Rolling, Sidelying to Sit ?Rolling: Min assist ?Sidelying to sit: Min assist ?  ?  ?  ?General bed  mobility comments: cues for sequence with HOB 30 degrees and assist for rolling and rising from surface ?  ? ?Transfers ?Overall transfer level: Needs assistance ?  ?Transfers: Sit to/from Stand, Bed to chair/wheelchair/BSC ?Sit to Stand: Mod assist, From elevated surface ?Stand pivot transfers: Min assist ?  ?  ?  ?  ?General transfer comment: mod assist with bed elevated, arm hooked and left knee blocked to stand from bed after 3 attempts. Once standing pt able to hold RW for pivot to recliner with cues for sequence and safety ?  ? ?Ambulation/Gait ?  ?  ?  ?  ?  ?  ?  ?General Gait Details: not yet able ? ? ?Stairs ?  ?  ?  ?  ?  ? ? ?Wheelchair Mobility ?  ? ?Modified Rankin (Stroke Patients Only) ?  ? ? ?  ?Balance Overall balance assessment: Needs assistance ?Sitting-balance support: No upper extremity supported, Feet supported ?Sitting balance-Leahy Scale: Fair ?Sitting balance - Comments: static sitting without assist ?  ?Standing balance support: Bilateral upper extremity supported, Reliant on assistive device for balance ?Standing balance-Leahy Scale: Poor ?Standing balance comment: Pt reliant on walker for UE support when standing. ?  ?  ?  ?  ?  ?  ?  ?  ?  ?  ?  ?  ? ?  ?Cognition Arousal/Alertness: Awake/alert ?Behavior During Therapy: Flat affect ?Overall Cognitive Status: Impaired/Different from baseline ?Area of Impairment: Orientation, Attention, Memory, Following commands, Safety/judgement ?  ?  ?  ?  ?  ?  ?  ?  ?Orientation Level: Disoriented to, Time, Situation, Place ?Current Attention Level: Sustained ?Memory: Decreased short-term memory ?Following Commands: Follows one  step commands consistently ?Safety/Judgement: Decreased awareness of deficits, Decreased awareness of safety ?  ?  ?General Comments: pt repeatedly stating he is at [facility] in [place] despite education, slow to process ?  ?  ? ?  ?Exercises General Exercises - Lower Extremity ?Long Arc Quad: AAROM, 10 reps, Both, Seated ?Hip  Flexion/Marching: AAROM, Both, 10 reps, Seated ? ?  ?General Comments   ?  ?  ? ?Pertinent Vitals/Pain Pain Assessment ?Pain Score: 5  ?Pain Location: left hip ?Pain Descriptors / Indicators: Aching ?Pain Intervention(s): Limited activity within patient's tolerance, Monitored during session, Repositioned  ? ? ?Home Living   ?  ?  ?  ?  ?  ?  ?  ?  ?  ?   ?  ?Prior Function    ?  ?  ?   ? ?PT Goals (current goals can now be found in the care plan section) Progress towards PT goals: Progressing toward goals ? ?  ?Frequency ? ? ? Min 3X/week ? ? ? ?  ?PT Plan Current plan remains appropriate  ? ? ?Co-evaluation   ?  ?  ?  ?  ? ?  ?AM-PAC PT "6 Clicks" Mobility   ?Outcome Measure ? Help needed turning from your back to your side while in a flat bed without using bedrails?: A Little ?Help needed moving from lying on your back to sitting on the side of a flat bed without using bedrails?: A Little ?Help needed moving to and from a bed to a chair (including a wheelchair)?: A Lot ?Help needed standing up from a chair using your arms (e.g., wheelchair or bedside chair)?: A Lot ?Help needed to walk in hospital room?: Total ?Help needed climbing 3-5 steps with a railing? : Total ?6 Click Score: 12 ? ?  ?End of Session   ?Activity Tolerance: Patient tolerated treatment well ?Patient left: in chair;with call bell/phone within reach;with chair alarm set;with family/visitor present ?Nurse Communication: Mobility status ?PT Visit Diagnosis: Other abnormalities of gait and mobility (R26.89);Difficulty in walking, not elsewhere classified (R26.2);Muscle weakness (generalized) (M62.81);Pain ?Pain - Right/Left: Left ?Pain - part of body: Hip ?  ? ? ?Time: 0233-4356 ?PT Time Calculation (min) (ACUTE ONLY): 31 min ? ?Charges:  $Therapeutic Exercise: 8-22 mins ?$Therapeutic Activity: 8-22 mins          ?          ? ?Maryiah Olvey P, PT ?Acute Rehabilitation Services ?Pager: [phone] ?Office: [phone] ? ? ? ?Lavenia Stumpo B Ariannah Arenson ?04/30/2022, 8:51  AM ? ?

## 2022-04-30 NOTE — Progress Notes (Signed)
SLP Cancellation Note ? ?Patient Details ?Name: [name] ?MRN: [id number] ?DOB: 1941-11-25 ? ? ?Cancelled treatment:       Reason Eval/Treat Not Completed: Medical issues which prohibited therapy (per RN, pt just placed back on BiPAP). Will f/u as able. ? ? ? ?Osie Bond., M.A. CCC-SLP ?Acute Rehabilitation Services ?Office [phone] ? ?Secure chat preferred ? ?04/30/2022, 1:37 PM ?

## 2022-04-30 NOTE — Consult Note (Addendum)
[facility] Nephrology Consultation Note  Requesting MD: Dr. Hazeline Junker Reason for consult: AKI  HPI:  [name] is a 81 y.o. male with history of hypertension, IDDM, COPD with chronic hypoxia on 2 L of oxygen, CAD status post CABG, chronic neck pain, was admitted for acute on chronic respiratory failure, seen as a consultation at the request of Dr. Jarvis Newcomer for acute kidney injury. Patient had multiple imaging studies including CT angio showing no pneumonia however, suspicious for acute pneumonia and possible CHF exacerbation.  He was treated with IV ceftriaxone and vancomycin.  Also treated with IV Lasix for the management of CHF exacerbation.  The Lasix was discontinued on 5/15 when patient developed AKI. The baseline serum creatinine level seems to be around 0.9.  The patient received IV contrast on 5/11, 5/13 and 5/14 during various CT scans.  The creatinine level rapidly worsening to 3.28 today.  Urine output is very minimal although it is not charted.  The vancomycin trough was 38.  The CT abdomen showed right adrenal gland mass, left kidney cyst, no hydronephrosis.  The patient developed intermittent confusion and bilateral upper extremity tremors.  The CT head concerning for 2 mm hyperdense focus with moderate to severe chronic ischemic changes.  Lumbar puncture was done which was consistent with a bacterial meningitis.  Seen by both neurologist and infectious disease.  The patient remains confused.  Also seen by cardiologist for possible CHF exacerbation.   The patient remains confused.  Not eating or drinking much.  The review of system is very limited.  The patient's wife and sister were present at the bedside.  Per family, patient used to work full-time job until last week.  Plan for MRI noted.  The home medications include amlodipine, statin, furosemide and multivitamin.  Today's lab showed worsening AKI, hypernatremia.  The primary team has  started hypotonic saline  and we are asked to evaluate AKI.  Creat  Date/Time Value Ref Range Status  04/02/2016 11:17 AM 0.77 0.70 - 1.18 mg/dL Final   Creatinine, Ser  Date/Time Value Ref Range Status  04/30/2022 06:03 AM 3.28 (H) 0.61 - 1.24 mg/dL Final  24/40/1027 25:36 PM 2.35 (H) 0.61 - 1.24 mg/dL Final  64/40/3474 25:95 AM 1.58 (H) 0.61 - 1.24 mg/dL Final  63/87/5643 32:95 AM 0.92 0.61 - 1.24 mg/dL Final  18/84/1660 63:01 AM 0.74 0.61 - 1.24 mg/dL Final  60/09/9322 55:73 AM 0.91 0.61 - 1.24 mg/dL Final  22/01/5426 06:23 AM 1.02 0.61 - 1.24 mg/dL Final  76/28/3151 76:16 AM 0.84 0.76 - 1.27 mg/dL Final  07/37/1062 69:48 AM 0.97 0.61 - 1.24 mg/dL Final  54/62/7035 00:93 AM 1.01 0.61 - 1.24 mg/dL Final     PMHx:   Past Medical History:  Diagnosis Date   Anxiety    CAD (coronary artery disease)     s/p CABG with LIMA to LAD, SVG to diag, SVG to OM, SVG to RCA cath 2002 Dr. Mayford Knife   CAP (community acquired pneumonia) due to Pneumococcus (HCC) 03/03/2017   Cataract    Chronic combined systolic and diastolic CHF (congestive heart failure) (HCC) 04/28/2018   Chronic kidney disease    Claudication (HCC)    normal ABIs   COPD (chronic obstructive pulmonary disease) (HCC)    Depression    Diabetes mellitus without complication (HCC)    Dyslipidemia    Hypersomnia    Hypertension    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HCC)    EF 40% with apical AK at cath  intolerant to ACE I and ARBS, EF 43% by nuclear stress test 08/2010   Kidney stones    Macular degeneration    Morbid obesity (HCC) 04/28/2018    Past Surgical History:  Procedure Laterality Date   CORONARY ARTERY BYPASS GRAFT     with LIMA to LAD, SVG to diag, SVG to OM, SVG to RCA cath 2002 Dr. Mayford Knife   urologic procedure for fertility      Family Hx:  Family History  Problem Relation Age of Onset   Hypertension Father    Breast cancer Father    CVA Father    Diabetes Mother    Hypertension Mother    CAD Sister    Hypertension Sister     Diabetes Sister    Heart disease Brother    Diabetes Brother    Breast cancer Sister    Hypertension Sister    Diabetes Sister    Heart disease Sister     Social History:  reports that he quit smoking about 20 years ago. His smoking use included cigarettes. He has never used smokeless tobacco. He reports current alcohol use. He reports that he does not use drugs.  Allergies: No Known Allergies  Medications: Prior to Admission medications   Medication Sig Start Date End Date Taking? Authorizing Provider  acetaminophen (TYLENOL) 500 MG tablet Take 100 mg by mouth every 8 (eight) hours as needed for mild pain or fever.   Yes [provider]  albuterol (PROVENTIL HFA;VENTOLIN HFA) 108 (90 Base) MCG/ACT inhaler Inhale 1-2 puffs into the lungs every 6 (six) hours as needed for wheezing or shortness of breath. Patient taking differently: Inhale 2 puffs into the lungs every 4 (four) hours as needed for wheezing or shortness of breath. 03/05/17  Yes Richarda Overlie, MD  amLODipine (NORVASC) 10 MG tablet Take 10 mg by mouth daily.   Yes [provider]  atorvastatin (LIPITOR) 40 MG tablet Take 1 tablet (40 mg total) by mouth daily. 01/22/22  Yes Turner, Cornelious Bryant, MD  carbamide peroxide (DEBROX) 6.5 % OTIC solution Place 5 drops into both ears daily as needed (wax buildup).   Yes [provider]  dorzolamide (TRUSOPT) 2 % ophthalmic solution Place 1 drop into both eyes 2 (two) times daily.  01/24/20  Yes [provider]  furosemide (LASIX) 40 MG tablet Take 40-80 mg by mouth daily as needed for edema or fluid. 03/11/18  Yes [provider]  Multiple Vitamin (MULTIVITAMIN) capsule Take 1 capsule by mouth daily.   Yes [provider]  metoprolol succinate (TOPROL-XL) 25 MG 24 hr tablet TAKE 1 TABLET BY MOUTH ONCE DAILY Patient taking differently: Take 25 mg by mouth daily. 05/05/17   Nahser, Deloris Ping, MD    I have reviewed the patient's current  medications.  Labs:  Results for orders placed or performed during the hospital encounter of 04/25/22 (from the past 48 hour(s))  Glucose, capillary     Status: Abnormal   Collection Time: 04/28/22 12:01 PM  Result Value Ref Range   Glucose-Capillary 129 (H) 70 - 99 mg/dL    Comment: Glucose reference range applies only to samples taken after fasting for at least 8 hours.  CSF culture w Gram Stain     Status: None (Preliminary result)   Collection Time: 04/28/22 12:06 PM   Specimen: CSF; Cerebrospinal Fluid  Result Value Ref Range   Specimen Description CSF    Special Requests Normal    Gram Stain  WBC PRESENT,BOTH PMN AND MONONUCLEAR NO ORGANISMS SEEN CYTOSPIN SMEAR    Culture      NO GROWTH 2 DAYS Performed at [facility], [address], [place], [place] [address]    Report Status PENDING   CSF cell count with differential collection tube #: 1     Status: Abnormal   Collection Time: 04/28/22  1:22 PM  Result Value Ref Range   Tube # 1    Color, CSF YELLOW (A) COLORLESS   Appearance, CSF CLEAR (A) CLEAR   Supernatant XANTHOCHROMIC    RBC Count, CSF 78 (H) 0 /cu mm   WBC, CSF 340 (HH) 0 - 5 /cu mm    Comment: CRITICAL RESULT CALLED TO, READ BACK BY AND VERIFIED WITH: Called to Allen Derry,  RN @1550  04/28/2022 by S.Stanley    Segmented Neutrophils-CSF 84 (H) 0 - 6 %   Lymphs, CSF 9 (L) 40 - 80 %   Monocyte-Macrophage-Spinal Fluid 7 (L) 15 - 45 %   Eosinophils, CSF 0 0 - 1 %    Comment: Performed at [facility], [address], [place], [place] [address]  CSF cell count with differential collection tube #: 4     Status: Abnormal   Collection Time: 04/28/22  1:22 PM  Result Value Ref Range   Tube # 4    Color, CSF YELLOW (A) COLORLESS   Appearance, CSF HAZY (A) CLEAR   Supernatant XANTHOCHROMIC    RBC Count, CSF 90 (H) 0 /cu mm   WBC, CSF 1,370 (HH) 0 - 5 /cu mm    Comment: CRITICAL RESULT CALLED TO, READ BACK BY AND VERIFIED WITH: Called to Allen Derry, RN @1550  04/28/2022 by Lafonda Mosses    Segmented Neutrophils-CSF 87 (H) 0 - 6 %   Lymphs, CSF 8 (L) 40 - 80 %   Monocyte-Macrophage-Spinal Fluid 4 (L) 15 - 45 %   Eosinophils, CSF 1 0 - 1 %    Comment: Performed at [facility], [address] [place], [place] [address]  Protein and glucose, CSF     Status: Abnormal   Collection Time: 04/28/22  1:22 PM  Result Value Ref Range   Glucose, CSF <20 (LL) 40 - 70 mg/dL    Comment: CRITICAL RESULT CALLED TO, READ BACK BY AND VERIFIED WITH: L.CURRIN RN 04/28/2022 1752 DAVISB    Total  Protein, CSF >600 (H) 15 - 45 mg/dL    Comment: RESULTS CONFIRMED BY MANUAL DILUTION Performed at [facility], [address], [place], [place] [address]   Pathologist smear review     Status: None   Collection Time: 04/28/22  1:22 PM  Result Value Ref Range   Path Review Numerous acute inflammatory cells.     Comment: Reviewed by Lily Lovings. Picklesimer, M.D. 04/30/2022 Performed at [facility], [address], [place], [place] [address]   Glucose, capillary     Status: Abnormal   Collection Time: 04/28/22  4:12 PM  Result Value Ref Range   Glucose-Capillary 169 (H) 70 - 99 mg/dL    Comment: Glucose reference range applies only to samples taken after fasting for at least 8 hours.  Glucose, capillary     Status: Abnormal   Collection Time: 04/28/22  8:17 PM  Result Value Ref Range   Glucose-Capillary 112 (H) 70 - 99 mg/dL    Comment: Glucose reference range applies only to samples taken after fasting for at least  8 hours.  Glucose, capillary     Status: Abnormal   Collection Time: 04/29/22 12:10 AM  Result Value Ref Range   Glucose-Capillary 155 (H) 70 - 99 mg/dL    Comment: Glucose reference range applies only to samples taken after fasting for at least 8 hours.  Basic metabolic panel     Status: Abnormal   Collection Time: 04/29/22  2:12 AM  Result Value Ref Range   Sodium 148 (H) 135 - 145 mmol/L   Potassium 3.9 3.5 - 5.1  mmol/L   Chloride 105 98 - 111 mmol/L   CO2 29 22 - 32 mmol/L   Glucose, Bld 151 (H) 70 - 99 mg/dL    Comment: Glucose reference range applies only to samples taken after fasting for at least 8 hours.   BUN 41 (H) 8 - 23 mg/dL   Creatinine, Ser 9.56 (H) 0.61 - 1.24 mg/dL   Calcium 9.3 8.9 - 21.3 mg/dL   GFR, Estimated 44 (L) >60 mL/min    Comment: (NOTE) Calculated using the CKD-EPI Creatinine Equation (2021)    Anion gap 14 5 - 15    Comment: Performed at [facility], [address], [place], [place] [address]  Glucose, capillary     Status: Abnormal   Collection Time: 04/29/22  7:43 AM  Result Value Ref Range   Glucose-Capillary 181 (H) 70 - 99 mg/dL    Comment: Glucose reference range applies only to samples taken after fasting for at least 8 hours.  Glucose, capillary     Status: Abnormal   Collection Time: 04/29/22 11:48 AM  Result Value Ref Range   Glucose-Capillary 144 (H) 70 - 99 mg/dL    Comment: Glucose reference range applies only to samples taken after fasting for at least 8 hours.  Comprehensive metabolic panel     Status: Abnormal   Collection Time: 04/29/22  2:18 PM  Result Value Ref Range   Sodium 153 (H) 135 - 145 mmol/L   Potassium 3.5 3.5 - 5.1 mmol/L   Chloride 105 98 - 111 mmol/L   CO2 29 22 - 32 mmol/L   Glucose, Bld 136 (H) 70 - 99 mg/dL    Comment: Glucose reference range applies only to samples taken after fasting for at least 8 hours.   BUN 58 (H) 8 - 23 mg/dL   Creatinine, Ser 8.46 (H) 0.61 - 1.24 mg/dL   Calcium 9.0 8.9 - 96.2 mg/dL   Total Protein 6.5 6.5 - 8.1 g/dL   Albumin 2.4 (L) 3.5 - 5.0 g/dL   AST 13 (L) 15 - 41 U/L   ALT 11 0 - 44 U/L   Alkaline Phosphatase 96 38 - 126 U/L   Total Bilirubin 0.7 0.3 - 1.2 mg/dL   GFR, Estimated 28 (L) >60 mL/min    Comment: (NOTE) Calculated using the CKD-EPI Creatinine Equation (2021)    Anion gap 19 (H) 5 - 15    Comment: Performed at [facility], [address] [place], [place]  [address]  CBC with Differential/Platelet     Status: Abnormal   Collection Time: 04/29/22  2:18 PM  Result Value Ref Range   WBC 20.6 (H) 4.0 - 10.5 K/uL   RBC 5.05 4.22 - 5.81 MIL/uL   Hemoglobin 14.8 13.0 - 17.0 g/dL   HCT 13.2 44.0 - 10.2 %   MCV 92.5 80.0 - 100.0 fL   MCH 29.3 26.0 - 34.0 pg   MCHC 31.7 30.0 -  36.0 g/dL   RDW 60.4 54.0 - 98.1 %   Platelets 429 (H) 150 - 400 K/uL   nRBC 0.0 0.0 - 0.2 %   Neutrophils Relative % 78 %   Neutro Abs 16.1 (H) 1.7 - 7.7 K/uL   Lymphocytes Relative 10 %   Lymphs Abs 2.0 0.7 - 4.0 K/uL   Monocytes Relative 11 %   Monocytes Absolute 2.2 (H) 0.1 - 1.0 K/uL   Eosinophils Relative 0 %   Eosinophils Absolute 0.0 0.0 - 0.5 K/uL   Basophils Relative 0 %   Basophils Absolute 0.1 0.0 - 0.1 K/uL   Immature Granulocytes 1 %   Abs Immature Granulocytes 0.28 (H) 0.00 - 0.07 K/uL    Comment: Performed at [facility], [address] [place], [place] [address]  Glucose, capillary     Status: Abnormal   Collection Time: 04/29/22  4:28 PM  Result Value Ref Range   Glucose-Capillary 189 (H) 70 - 99 mg/dL    Comment: Glucose reference range applies only to samples taken after fasting for at least 8 hours.  Glucose, capillary     Status: Abnormal   Collection Time: 04/29/22  7:35 PM  Result Value Ref Range   Glucose-Capillary 118 (H) 70 - 99 mg/dL    Comment: Glucose reference range applies only to samples taken after fasting for at least 8 hours.  Glucose, capillary     Status: Abnormal   Collection Time: 04/30/22 12:28 AM  Result Value Ref Range   Glucose-Capillary 133 (H) 70 - 99 mg/dL    Comment: Glucose reference range applies only to samples taken after fasting for at least 8 hours.  Basic metabolic panel     Status: Abnormal   Collection Time: 04/30/22  6:03 AM  Result Value Ref Range   Sodium 149 (H) 135 - 145 mmol/L   Potassium 3.7 3.5 - 5.1 mmol/L   Chloride 107 98 - 111 mmol/L   CO2 27 22 - 32 mmol/L   Glucose, Bld 163 (H) 70 -  99 mg/dL    Comment: Glucose reference range applies only to samples taken after fasting for at least 8 hours.   BUN 73 (H) 8 - 23 mg/dL   Creatinine, Ser 8.29 (H) 0.61 - 1.24 mg/dL   Calcium 8.6 (L) 8.9 - 10.3 mg/dL   GFR, Estimated 19 (L) >60 mL/min    Comment: (NOTE) Calculated using the CKD-EPI Creatinine Equation (2021)    Anion gap 15 5 - 15    Comment: Performed at [facility], [address] [place], [place] [address]  Vancomycin, random     Status: None   Collection Time: 04/30/22  6:03 AM  Result Value Ref Range   Vancomycin Rm 38     Comment:        Random Vancomycin therapeutic range is dependent on dosage and time of specimen collection. A peak range is 20.0-40.0 ug/mL A trough range is 5.0-15.0 ug/mL        Performed at [facility], [address] [place], [place] [address]   Sodium, urine, random     Status: None   Collection Time: 04/30/22  6:46 AM  Result Value Ref Range   Sodium, Ur 86 mmol/L    Comment: Performed at [facility], [address] [place], [place] [address]  Protein / creatinine ratio, urine     Status: Abnormal   Collection Time: 04/30/22  6:46 AM  Result Value Ref Range   Creatinine, Urine 84.03 mg/dL   Total Protein, Urine 63 mg/dL    Comment: NO NORMAL RANGE ESTABLISHED FOR THIS TEST   Protein Creatinine Ratio 0.75 (H) 0.00 - 0.15 mg/mg[Cre]    Comment: Performed at [facility], [address] [place], [place] [address]  Urinalysis, Complete w Microscopic Urine, Clean Catch     Status: Abnormal   Collection Time: 04/30/22  6:46 AM  Result Value Ref Range   Color, Urine AMBER (A) YELLOW    Comment: BIOCHEMICALS MAY BE AFFECTED BY COLOR   APPearance CLEAR CLEAR   Specific Gravity, Urine 1.025 1.005 - 1.030   pH 5.0 5.0 - 8.0   Glucose, UA NEGATIVE NEGATIVE mg/dL   Hgb urine dipstick MODERATE (A) NEGATIVE   Bilirubin Urine NEGATIVE NEGATIVE   Ketones, ur NEGATIVE NEGATIVE mg/dL   Protein, ur NEGATIVE  NEGATIVE mg/dL   Nitrite NEGATIVE NEGATIVE   Leukocytes,Ua NEGATIVE NEGATIVE   Squamous Epithelial / LPF 0-5 0 - 5   WBC, UA 0-5 0 - 5 WBC/hpf   RBC / HPF 6-10 0 - 5 RBC/hpf   Bacteria, UA RARE (A) NONE SEEN   Budding Yeast PRESENT     Comment: Performed at [facility], [address], [place], [place] [address]  Glucose, capillary     Status: Abnormal   Collection Time: 04/30/22  8:29 AM  Result Value Ref Range   Glucose-Capillary 155 (H) 70 - 99 mg/dL    Comment: Glucose reference range applies only to samples taken after fasting for at least 8 hours.     ROS: Unable to obtain review of system as patient is confused.  Physical Exam: Vitals:   04/30/22 0830 04/30/22 0847  BP: (!) 147/77   Pulse: (!) 113 (!) 140  Resp: (!) 24 20  Temp: 98.3 F (36.8 C)   SpO2: 96% 96%     General exam: Confused male, currently on high flow oxygen.  Only whispering. Respiratory system: Bilateral coarse breath sound, no wheezing Cardiovascular system: S1 & S2 heard, tachycardic.  No pedal edema. Gastrointestinal system: Abdomen is nondistended, soft and nontender. Normal bowel sounds heard. Central nervous system: Alert and whispering only.  Confused Extremities: No edema, no cyanosis Skin: No rashes, lesions or ulcers Psychiatry: Judgement and insight impaired.   Assessment/Plan:  #Acute kidney injury, oliguric due to contrast nephropathy (IV contrast on 5/11,5/13 and 5/14) concomitant with use of diuretics, decreased oral intake and use of vancomycin. UA without negative however spot urine PCR around 0.75, I will check SPEP and free light chain The CT scan of abdomen pelvis ruled out hydronephrosis. I will order bladder scan. Clinically the patient looks very dry and lab results are supporting this.  I agree with continuing hypotonic IV fluid and discontinuing Lasix.  The respiratory distress/hypoxia probably because of pneumonia. Strict ins and out, daily lab.  Please avoid use  of IV contrast. Recommend to avoid vancomycin if possible to minimize renal insult. If no improvement in kidney function then he may be heading towards requiring dialysis.  Given multiple comorbidity and current declining condition, he may not be able to tolerate outpatient dialysis.  Noted he is DNR.  We will continue to follow.  #Acute on chronic hypoxic respiratory failure presumably due to pneumonia/COPD exacerbation.  I do not think he is fluid overloaded.  Agree with continuing antibiotics, oxygen and bronchodilators per primary team.  #Hypernatremia, hypovolemic/free water deficit: I think this is  due to dehydration and use of Lasix.  Agree with using hypotonic fluid.  I encouraged him to drink free water if possible.  #Acute bacterial meningitis: Status post lumbar puncture.  Seen by ID and neurology.  Currently on broad-spectrum antibiotics.  I recommend to avoid vancomycin if possible because of AKI.  #Multifocal pneumonia: On broad-spectrum antibiotics.  #Aphonia and dysphagia: SLP eval, neurology and per primary team.  I have discussed with cardiology and patient's family member.  I will talk to the primary team as well.  Thank you for the consult.  We will continue to follow with you.  Renea Schoonmaker Jaynie Collins 04/30/2022, 11:05 AM  [facility].

## 2022-04-30 NOTE — Progress Notes (Addendum)
?Progress Note ? ?Patient: [name] PFX:[id number] DOB: 01-12-1941  ?DOA: 04/25/2022  DOS: 04/30/2022  ?  ?Brief hospital course: ?[name] is a 81 y.o. 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 He was admitted for acute on chronic hypoxic respiratory failure with suspicion of acute CHF and/or pneumonia with subsequent CTA chest showing no PE. He was afebrile with leukocytosis and low back pain in addition to his chronic neck pain. Imaging revealed a presacral density, adrenal mass, upper normal sized mediastinal lymph nodes. CT head showed nonspecific 49m hyperdense focus if periventricular parietal white matter for which subsequent MRI was ordered. This was severely motion degraded and limited, but revealed abnormal diffusion signal abnormality layering within the occipital horns of lateral ventricles and Sylvian fissure suggestive of SAH vs. meningitis. Neurology was consulted, performed LP which is consistent with bacterial meningitis. 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 ? ?Assessment and Plan: ?Acute on chronic hypoxic respiratory failure: Presumably due primarily to aspiration pneumonia, possible contributions also from acute CHF and COPD exacerbation. CT w/pulmonary parenchyma shows emphysema with peribronchial thickening, minimal atelectasis and no effusion/infiltrate/PTX. No wheezes on exam. CTA chest without PE. There is evidence of atelectasis, pneumonia, and interstitial edema. No PE by CTA. No LE DVT by venous U/S. ?- Continue to supplement oxygen as needed to maintain normal respiratory effort and SpO2 >89%.  ?- Patient is confirmed to be DNR/DNI. ?- Continue baseline COPD medications and prn albuterol. No  wheezing.  ?- Encourage frequent incentive spirometry. ? ?Bacterial meningitis: Negative gram stain, though Neutrophils severely elevated with low glucose, high protein on LP 5/14.  ?- Started high dose vancomycin, ampicillin, ceftriaxone at high doses. ID consulted, deescalated to ceftriaxone meningitic dosing alone. DC vancomycin, ampicillin. ?- Continue droplet precautions.  ?- Household contacts over the past 7 days have been given chemoprophylaxis on 5/14. No further needs in this regard per multiple physicians. ?- Plan to check MRI brain/C spine to evaluate for collections and further define infarct pending improvement in renal function to allow use of contrast.  ?- HSV PCR not back yet, but low enough suspicion not to cover with acyclovir in setting of renal failure. ? ?Acute renal failure: SCr worsening, pt appears dry. Multiple insults including lasix, NPO, repeated contrast dye loads, vancomycin. No obstruction evident by CT. FENa >2% suggestive of intrinsic renal dysfunction though no casts noted, 6-10 RBCs, U Pr:Cr 0.75.  ?- Give IVF today, monitor UOP. Monitor BMP this PM. With PM check, renal function improving, worsening free water deficits. D/w nephrology, Dr. BCarolin Sicks We will start D5W @ 75 cc/hr and monitor in AM. Will need to discuss NGT at least for free water in the short term. ? ?Acute urinary retention: Given his renal failure, critical illness, insert foley, monitor UOP. ? ?Severe back pain: ?if related to meningitis  ?- Treat empirically for lumbar paraspinal spasm with tylenol, robaxin, heating pad. Can continue prn oxycodone. Change morphine to dilaudid with renal impairment. Further work up depends on clinical course. ? 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 Missed doses of lasix as he does often when on long road trips. BNP elevated to 540. ?- Given  lasix 5/14, holding further dosing. Cardiology consulted.  ?- Continue metoprolol,  giving IV scheduled while NPO. ? ?Acute CVA: Suggested by 86m diffusion signal abnormality involving the posterior left frontotemporal region on limited MRI.  ?- Ok to start ASA, though made strict NPO due to ongoing silent aspiration.  ?- Repeat MRI in coming days once renal function improves. ? ?CAD s/p CABG: Troponin initially negative at 139 subsequent evidence of demand ischemia (up to 522 due to sepsis. ACS is not currently suspected.  ?- Continue aspirin, metoprolol, statin ?- Cardiology consulted, no changes to management or ischemic work up currently planned. ? ?Multifocal pneumonia: Increasing consolidations on CTA coinciding with respiratory deterioration. ?- Started abx and will continue as above.  ?- Monitor blood cultures drawn at admission (unchanged 1 of 4 S. anginosis and other is NGTD x5 days) ? ?Presacral mass: Indeterminate, incidental. Not rim-enhancing. ?- Discussed with IR, suggests PET-CT prior to selecting a target for biopsy. ? 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 ?- Renin and aldosterone pending ?- Cortisol is 49.2. Metanephrines negative. ?- Needs PET to delineate this as well as presacral density on CT.  ? ?Neck mass: CT showed lipoma, which is consistent with palpation on exam. Also upper normal sized LN's. ? ?Aphonia, history of dysphagia: ?If related to left frontotemporal infarct suggested by MRI.  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 Consider CT neck to r/o malignancy.  ?- SLP evaluation will be continued ?- Esophagram Feb 2022 showed mild dysmotility without stricture or mass.  ? ?Tremor: Suspect this is due to CNS infection. Not consistent with seizure. ? ?NIDT2DM: HbA1c 6.5%.  ?- Continue moderate SSI, hold metformin with contrast exposures.  ? ?Obesity: Estimated body mass index is 34.91 kg/m? as calculated from the following: ?  Height as of this encounter: '5\' 6"'$  (1.676 m). ?  Weight as of this encounter: 98.1 kg. ? ?Subjective: Again more  alert this morning, still can't speak. He's confused and generally withdrawn. He has no specific complaints but confirms his back does hurt when asked. Remains afebrile. ? ?Objective: ?Vitals:  ? 04/30/22 1120 04/30/22 1458 04/30/22 1540 04/30/22 1600  ?BP: 115/73  129/66   ?Pulse: (!) 117 (!) 105 (!) 104 (!) 103  ?Resp: 20 (!) 23 (!) 21 18  ?Temp: 98.1 ?F (36.7 ?C)  97.8 ?F (36.6 ?C)   ?TempSrc: Oral  Axillary   ?SpO2: 99% 96% 98% 99%  ?Weight:      ?Height:      ? ?Gen: 81y.o. male in no distress ?Pulm: Nonlabored upper 90%'s on 8L HFNC, Diminished at bases. ?CV: Regular tachycardia, no murmur, rub, or gallop. No JVD, no dependent edema. Legs appear shriveled, MM dry. ?GI: Abdomen soft, non-tender, non-distended, with normoactive bowel sounds.  ?Ext: Warm, no deformities ?Skin: No new rashes, lesions or ulcers on visualized skin. ?Neuro: Alert, confused, redirectable, aphonic. No definite focal neurological deficits. ?Psych: Judgement and insight appears marginal.  ? ?Data Personally reviewed: ?CBC: ?Recent Labs  ?Lab 04/25/22 ?1104 04/25/22 ?1706 04/27/22 ?0412 04/28/22 ?0322 04/29/22 ?1418  ?WBC 16.0*  --  16.8* 18.6* 20.6*  ?NEUTROABS  --   --  13.8*  --  16.1*  ?HGB 14.0 13.9 13.4 15.1 14.8  ?HCT 42.3 41.0 41.9 47.6 46.7  ?MCV 89.4  --  91.3 91.4 92.5  ?PLT 282  --  364 412* 429*  ? ?Basic Metabolic Panel: ?Recent Labs  ?Lab 04/26/22 ?0245 04/27/22 ?0412 04/28/22 ?0322 04/29/22 ?0735305/15/23 ?  1418 04/30/22 ?0603  ?NA 135 139 141 148* 153* 149*  ?K 3.6 3.7 3.7 3.9 3.5 3.7  ?CL 94* 100 99 105 105 107  ?CO2 '29 30 30 29 29 27  '$ ?GLUCOSE 143* 218* 213* 151* 136* 163*  ?BUN 17 24* 27* 41* 58* 73*  ?CREATININE 0.91 0.74 0.92 1.58* 2.35* 3.28*  ?CALCIUM 8.8* 9.5 9.7 9.3 9.0 8.6*  ?MG 2.1 2.2 2.2  --   --   --   ? ?GFR: ?Estimated Creatinine Clearance: 20.3 mL/min (A) (by C-G formula based on SCr of 3.28 mg/dL (H)). ? ?HbA1C: ?No results for input(s): HGBA1C in the last 72 hours. ? ?CBG: ?Recent Labs  ?Lab  04/29/22 ?1935 04/30/22 ?0028 04/30/22 ?7670 04/30/22 ?1136 04/30/22 ?1523  ?GLUCAP 118* 133* 155* 146* 122*  ? ?Urine analysis: ?   ?Component Value Date/Time  ? COLORURINE AMBER (A) 04/30/2022 1100  ? APPEARANCEU

## 2022-04-30 NOTE — Consult Note (Signed)
Reason for Consult: Dysphonia ?Referring Physician: Dr. Bonner Puna ? ?[name] is an 81 y.o. male.  ?HPI: [name] is a 81 year old male with multiple comorbidities and his past medical history.  He was admitted to the hospital for acute on chronic respiratory failure.  Since that time he has developed dysphonia and a weak cough and I was consulted for vocal evaluation.  On review of the notes it appears that there is a concern for a possible acute infarct on the MRI report as well as the question of a small subarachnoid hemorrhage on the CT scan. ? ?Past Medical History:  ?Diagnosis Date  ? Anxiety   ? CAD (coronary artery disease)   ?  s/p CABG with LIMA to LAD, SVG to diag, SVG to OM, SVG to RCA cath 2002 Dr. Radford Pax  ? CAP (community acquired pneumonia) due to Pneumococcus ([place]) 03/03/2017  ? Cataract   ? Chronic combined systolic and diastolic CHF (congestive heart failure) ([place]) 04/28/2018  ? Chronic kidney disease   ? [facility])   ? normal ABIs  ? COPD (chronic obstructive pulmonary disease) ([place])   ? Depression   ? Diabetes mellitus without complication ([place])   ? Dyslipidemia   ? Hypersomnia   ? Hypertension   ? 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 EF 40% with apical AK at cath intolerant to ACE I and ARBS, EF 43% by nuclear stress test 08/2010  ? Kidney stones   ? Macular degeneration   ? Morbid obesity ([place]) 04/28/2018  ? ? ?Past Surgical History:  ?Procedure Laterality Date  ? CORONARY ARTERY BYPASS GRAFT    ? with LIMA to LAD, SVG to diag, SVG to OM, SVG to RCA cath 2002 Dr. Radford Pax  ? urologic procedure for fertility    ? ? ?Family History  ?Problem Relation Age of Onset  ? Hypertension Father   ? Breast cancer Father   ? CVA Father   ? Diabetes Mother   ? Hypertension Mother   ? CAD Sister   ? Hypertension Sister   ? Diabetes Sister   ? Heart disease Brother   ? Diabetes Brother   ? Breast cancer Sister   ? Hypertension Sister   ? Diabetes Sister   ? Heart disease Sister   ? ? ?Social History:   reports that he quit smoking about 20 years ago. His smoking use included cigarettes. He has never used smokeless tobacco. He reports current alcohol use. He reports that he does not use drugs. ? ?Allergies: No Known Allergies ? ?Medications: I have reviewed the patient's current medications. ? ?Results for orders placed or performed during the hospital encounter of 04/25/22 (from the past 48 hour(s))  ?CSF cell count with differential collection tube #: 1     Status: Abnormal  ? Collection Time: 04/28/22  1:22 PM  ?Result Value Ref Range  ? Tube # 1   ? Color, CSF YELLOW (A) COLORLESS  ? Appearance, CSF CLEAR (A) CLEAR  ? Supernatant XANTHOCHROMIC   ? RBC Count, CSF 78 (H) 0 /cu mm  ? WBC, CSF 340 (HH) 0 - 5 /cu mm  ?  Comment: CRITICAL RESULT CALLED TO, READ BACK BY AND VERIFIED WITH: ?Called to Elaina Pattee,  RN '@1550'$  04/28/2022 by Catha Gosselin ?  ? Segmented Neutrophils-CSF 84 (H) 0 - 6 %  ? Lymphs, CSF 9 (L) 40 - 80 %  ? Monocyte-Macrophage-Spinal Fluid 7 (L) 15 - 45 %  ? Eosinophils, CSF 0 0 -  1 %  ?  Comment: Performed at [facility], [place] [address] [place], [place] [address]  ?CSF cell count with differential collection tube #: 4     Status: Abnormal  ? Collection Time: 04/28/22  1:22 PM  ?Result Value Ref Range  ? Tube # 4   ? Color, CSF YELLOW (A) COLORLESS  ? Appearance, CSF HAZY (A) CLEAR  ? Supernatant XANTHOCHROMIC   ? RBC Count, CSF 90 (H) 0 /cu mm  ? WBC, CSF 1,370 (HH) 0 - 5 /cu mm  ?  Comment: CRITICAL RESULT CALLED TO, READ BACK BY AND VERIFIED WITH: ?Called to Elaina Pattee, RN '@1550'$  04/28/2022 by Catha Gosselin ?  ? Segmented Neutrophils-CSF 87 (H) 0 - 6 %  ? Lymphs, CSF 8 (L) 40 - 80 %  ? Monocyte-Macrophage-Spinal Fluid 4 (L) 15 - 45 %  ? Eosinophils, CSF 1 0 - 1 %  ?  Comment: Performed at [facility], [place] [address], [place], [place] [address]  ?Protein and glucose, CSF     Status: Abnormal  ? Collection Time: 04/28/22  1:22 PM  ?Result Value Ref Range  ? Glucose, CSF <20 (LL) 40 - 70  mg/dL  ?  Comment: CRITICAL RESULT CALLED TO, READ BACK BY AND VERIFIED WITH: ?L.CURRIN RN 04/28/2022 1752 DAVISB ?  ? Total  Protein, CSF >600 (H) 15 - 45 mg/dL  ?  Comment: RESULTS CONFIRMED BY MANUAL DILUTION ?Performed at [facility], [place] [address], [place], [place] [address] ?  ?Miscellaneous [facility] test (send-out)     Status: None  ? Collection Time: 04/28/22  1:22 PM  ?Result Value Ref Range  ? [facility] test code [id number]   ? [facility] test name MENINGITIS ENCEPHALITIS PANEL BY PCR SENT TO [facility]   ? Source [employer]) CSF   ?  Comment: Performed at [facility], [place] [address], [place], [place] [address]  ? Misc [facility] result COMMENT   ?  Comment: (NOTE) ?Performed At: [facility] ?[address] [place], [place] [id number] ?Rush Farmer MD HF:[id number] ?  ?Pathologist smear review     Status: None  ? Collection Time: 04/28/22  1:22 PM  ?Result Value Ref Range  ? Path Review Numerous acute inflammatory cells.   ?  Comment: Reviewed by Reesa Chew. Picklesimer, M.D. ?04/30/2022 ?Performed at [facility], [place] [address], [place], [place] [address] ?  ?Glucose, capillary     Status: Abnormal  ? Collection Time: 04/28/22  4:12 PM  ?Result Value Ref Range  ? Glucose-Capillary 169 (H) 70 - 99 mg/dL  ?  Comment: Glucose reference range applies only to samples taken after fasting for at least 8 hours.  ?Glucose, capillary     Status: Abnormal  ? Collection Time: 04/28/22  8:17 PM  ?Result Value Ref Range  ? Glucose-Capillary 112 (H) 70 - 99 mg/dL  ?  Comment: Glucose reference range applies only to samples taken after fasting for at least 8 hours.  ?Glucose, capillary     Status: Abnormal  ? Collection Time: 04/29/22 12:10 AM  ?Result Value Ref Range  ? Glucose-Capillary 155 (H) 70 - 99 mg/dL  ?  Comment: Glucose reference range applies only to samples taken after fasting for at least 8 hours.  ?Basic metabolic panel     Status: Abnormal  ? Collection Time: 04/29/22  2:12 AM  ?Result Value Ref  Range  ? Sodium 148 (H) 135 - 145 mmol/L  ?  Potassium 3.9 3.5 - 5.1 mmol/L  ? Chloride 105 98 - 111 mmol/L  ? CO2 29 22 - 32 mmol/L  ? Glucose, Bld 151 (H) 70 - 99 mg/dL  ?  Comment: Glucose reference range applies only to samples taken after fasting for at least 8 hours.  ? BUN 41 (H) 8 - 23 mg/dL  ? Creatinine, Ser 1.58 (H) 0.61 - 1.24 mg/dL  ? Calcium 9.3 8.9 - 10.3 mg/dL  ? GFR, Estimated 44 (L) >60 mL/min  ?  Comment: (NOTE) ?Calculated using the CKD-EPI Creatinine Equation (2021) ?  ? Anion gap 14 5 - 15  ?  Comment: Performed at [facility], [place] [address], [place], [place] [address]  ?Glucose, capillary     Status: Abnormal  ? Collection Time: 04/29/22  7:43 AM  ?Result Value Ref Range  ? Glucose-Capillary 181 (H) 70 - 99 mg/dL  ?  Comment: Glucose reference range applies only to samples taken after fasting for at least 8 hours.  ?Glucose, capillary     Status: Abnormal  ? Collection Time: 04/29/22 11:48 AM  ?Result Value Ref Range  ? Glucose-Capillary 144 (H) 70 - 99 mg/dL  ?  Comment: Glucose reference range applies only to samples taken after fasting for at least 8 hours.  ?Comprehensive metabolic panel     Status: Abnormal  ? Collection Time: 04/29/22  2:18 PM  ?Result Value Ref Range  ? Sodium 153 (H) 135 - 145 mmol/L  ? Potassium 3.5 3.5 - 5.1 mmol/L  ? Chloride 105 98 - 111 mmol/L  ? CO2 29 22 - 32 mmol/L  ? Glucose, Bld 136 (H) 70 - 99 mg/dL  ?  Comment: Glucose reference range applies only to samples taken after fasting for at least 8 hours.  ? BUN 58 (H) 8 - 23 mg/dL  ? Creatinine, Ser 2.35 (H) 0.61 - 1.24 mg/dL  ? Calcium 9.0 8.9 - 10.3 mg/dL  ? Total Protein 6.5 6.5 - 8.1 g/dL  ? Albumin 2.4 (L) 3.5 - 5.0 g/dL  ? AST 13 (L) 15 - 41 U/L  ? ALT 11 0 - 44 U/L  ? Alkaline Phosphatase 96 38 - 126 U/L  ? Total Bilirubin 0.7 0.3 - 1.2 mg/dL  ? GFR, Estimated 28 (L) >60 mL/min  ?  Comment: (NOTE) ?Calculated using the CKD-EPI Creatinine Equation (2021) ?  ? Anion gap 19 (H) 5 - 15  ?  Comment:  Performed at [facility], [place] [address] [place], [place] [address]  ?CBC with Differential/Platelet     Status: Abnormal  ? Collection Time: 04/29/22  2:18 PM  ?Result Value Ref Range  ? WBC 20

## 2022-04-30 NOTE — Progress Notes (Signed)
?   ? ? ? ? ?[facility] ? ?Date of Admission:  04/25/2022    ? ?Abx: ?5/14-c vanc ?5/14-c amp ?5/11-c ceftriaxone ?  ?5/11-14 azith                                                    ?  ?  ?Assessment: ?81 yo male copd on home 2 l o2, dysphagia, HFrEF, dm2, cad s/p cabg, djd with cervical neck pain, admitted 5/11 initially with chest pain/dyspnea and acute on chronic hypoxic resp failure initially treated for CAP, but subsequently found to have ventriculitis/meningitis, currently complicated by also dysphagia/hypophonia, aki, and demand ischemia ?  ?  ?  ?Initial chest CTA no PE or opacity ?He had ct had and subsequent mri (ordered for complaint of neck pain) that showed periventricular white matter 51m hyperdense focus and layering within the occipital horns of lateral ventricles suggesting SAH vs meningitis.  ?  ?Neurology evaluated and performed LP 3 days after he has been on CAP abx treatment. 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  ?5/14 LP -- hazy; wbc 1300; rbc 90; neutrophilic 809% protein >>628 glucose<20. I do not see reported xanthochromia and his presentation is not consistent with SAH ?  ?Imaging does suggest ventriculitis as well with layering density in the ventricles ?He was started on meningitis empiric tx with the LP result and imaging/presentation ?  ?Of note, he has strep anginosus in 1 of 4 bottles this admission. Will see if the csf cx grows anything. Strep angi causes abscess commonly and the mri presentation is rather atypical. I am not sure what to make of it at this time -- he is on appropriate coverage abx for it as well ?  ?Of note, his abd/pelv ct portion showed presacral mass like density and adrenal mass. Primary team awared and ponder work up ? ?Ent following along for hypophonia which they suspect could be related to cva ? ? ?----------- ?5/16 assessment ?Appears on exam today some improvement in mentation ?Still hypophonic ?Aki  worsening ?Culture ngtd; csf pcr in progress ? ? ?Given aki, would stop ampicillin to avoid nephrotoxicity and given lower likelihood of listeriosis. If culture still pending would continue vanc alternative with linezolid if needed. He is to remain on ceftriaxone for now; aki would have good vanc level at this time ? ?  ?Plan: ?Continue ceftriaxone 2 gram q12 ?Monitor vanc level ?Stop ampicillin ?F/u culture/pcr from csf ?Discussed with primary team ? ?I spent more than 50 minute reviewing data/chart, and coordinating care and >50% direct face to face time providing counseling/discussing diagnostics/treatment plan with patient ? ? ?Principal Problem: ?  Acute on chronic respiratory failure with hypoxia ([facility] ?Active Problems: ?  COPD with acute exacerbation ([facility] ?  Diabetes mellitus type II, non insulin dependent ([facility] ?  Acute on chronic systolic (congestive) heart failure ([facility] ?  Presacral mass ?  Adrenal mass ([facility] ?  Neck mass ?  Tremor ?  Ventriculitis of brain due to bacteria ?  Meningitis ?  Severe sepsis ([facility] ?  Dysphonia ?  Paresis of right vocal cord ? ? ?No Known Allergies ? ?Scheduled Meds: ? dorzolamide  1 drop Both Eyes BID  ? heparin injection (subcutaneous)  5,000 Units Subcutaneous Q8H  ? insulin aspart  0-15  Units Subcutaneous TID WC  ? insulin aspart  0-5 Units Subcutaneous QHS  ? metoprolol tartrate  5 mg Intravenous Q6H  ? mometasone-formoterol  2 puff Inhalation BID  ? sodium chloride flush  3 mL Intravenous Q12H  ? umeclidinium bromide  1 puff Inhalation Daily  ? vancomycin variable dose per unstable renal function (pharmacist dosing)   Does not apply See admin instructions  ? ?Continuous Infusions: ? sodium chloride 150 mL/hr at 04/30/22 1302  ? sodium chloride 500 mL (04/26/22 0006)  ? cefTRIAXone (ROCEPHIN)  IV 2 g (04/30/22 0933)  ? ?PRN Meds:.sodium chloride, acetaminophen, HYDROmorphone (DILAUDID) injection, levalbuterol, LORazepam, ondansetron (ZOFRAN) IV, sodium chloride  flush ? ? ?SUBJECTIVE: ?Mentation improving ?No further tremor ?Pain ?better ?Afebrile ?No focal weakness ?Still hypophonic ? ?Hypernatremic ?Aki worsening ? ? ?Review of Systems: ?ROS ?All other ROS was negative, except mentioned above ? ? ? ? ?OBJECTIVE: ?Vitals:  ? 04/30/22 1120 04/30/22 1458 04/30/22 1540 04/30/22 1600  ?BP: 115/73  129/66   ?Pulse: (!) 117 (!) 105 (!) 104 (!) 103  ?Resp: 20 (!) 23 (!) 21 18  ?Temp: 98.1 ?F (36.7 ?C)  97.8 ?F (36.6 ?C)   ?TempSrc: Oral  Axillary   ?SpO2: 99% 96% 98% 99%  ?Weight:      ?Height:      ? ?Body mass index is 34.91 kg/m?. ? ?Physical Exam ?General/constitutional: no distress; interactive; follows command; not conversant due to hypophonia ?HEENT: Normocephalic, PER, Conj Clear, EOMI, Oropharynx clear ?Neck supple ?CV: rrr no mrg ?Lungs: clear to auscultation, normal respiratory effort ?Abd: Soft, Nontender ?Ext: no edema ?Skin: No Rash ?Neuro: strength symmetric 5/5; hypophonic; no rigidity/tremor ?MSK: no peripheral joint swelling/tenderness/warmth ? ? ? ? ?Lab Results ?Lab Results  ?Component Value Date  ? WBC 20.6 (H) 04/29/2022  ? HGB 14.8 04/29/2022  ? HCT 46.7 04/29/2022  ? MCV 92.5 04/29/2022  ? PLT 429 (H) 04/29/2022  ?  ?Lab Results  ?Component Value Date  ? CREATININE 3.28 (H) 04/30/2022  ? BUN 73 (H) 04/30/2022  ? NA 149 (H) 04/30/2022  ? K 3.7 04/30/2022  ? CL 107 04/30/2022  ? CO2 27 04/30/2022  ?  ?Lab Results  ?Component Value Date  ? ALT 11 04/29/2022  ? AST 13 (L) 04/29/2022  ? ALKPHOS 96 04/29/2022  ? BILITOT 0.7 04/29/2022  ?  ? ? ?Microbiology: ?Recent Results (from the past 240 hour(s))  ?Resp Panel by RT-PCR (Flu A&B, Covid) Nasopharyngeal Swab     Status: None  ? Collection Time: 04/25/22 11:04 AM  ? Specimen: Nasopharyngeal Swab; Nasopharyngeal(NP) swabs in vial transport medium  ?Result Value Ref Range Status  ? SARS Coronavirus 2 by RT PCR NEGATIVE NEGATIVE Final  ?  Comment: (NOTE) ?SARS-CoV-2 target nucleic acids are NOT DETECTED. ? ?The  SARS-CoV-2 RNA is generally detectable in upper respiratory ?specimens during the acute phase of infection. The lowest ?concentration of SARS-CoV-2 viral copies this assay can detect is ?138 copies/mL. A negative result does not preclude SARS-Cov-2 ?infection and should not be used as the sole basis for treatment or ?other patient management decisions. A negative result may occur with  ?improper specimen collection/handling, submission of specimen other ?than nasopharyngeal swab, presence of viral mutation(s) within the ?areas targeted by this assay, and inadequate number of viral ?copies(<138 copies/mL). A negative result must be combined with ?clinical observations, patient history, and epidemiological ?information. The expected result is Negative. ? ?Fact Sheet for Patients:  ?EntrepreneurPulse.com.au ? ?Fact Sheet for Healthcare Providers:  ?  IncredibleEmployment.be ? ?This test is no t yet approved or cleared by the [place] FDA and  ?has been authorized for detection and/or diagnosis of SARS-CoV-2 by ?FDA under an Emergency Use Authorization (EUA). This EUA will remain  ?in effect (meaning this test can be used) for the duration of the ?COVID-19 declaration under Section 564(b)(1) of the Act, 21 ?U.S.C.section 360bbb-3(b)(1), unless the authorization is terminated  ?or revoked sooner.  ? ? ?  ? Influenza A by PCR NEGATIVE NEGATIVE Final  ? Influenza B by PCR NEGATIVE NEGATIVE Final  ?  Comment: (NOTE) ?The Xpert Xpress SARS-CoV-2/FLU/RSV plus assay is intended as an aid ?in the diagnosis of influenza from Nasopharyngeal swab specimens and ?should not be used as a sole basis for treatment. Nasal washings and ?aspirates are unacceptable for Xpert Xpress SARS-CoV-2/FLU/RSV ?testing. ? ?Fact Sheet for Patients: ?EntrepreneurPulse.com.au ? ?Fact Sheet for Healthcare Providers: ?IncredibleEmployment.be ? ?This test is not yet approved or  cleared by the [place] FDA and ?has been authorized for detection and/or diagnosis of SARS-CoV-2 by ?FDA under an Emergency Use Authorization (EUA). This EUA will remain ?in effect (meaning this test can be used) fo

## 2022-04-30 NOTE — Progress Notes (Signed)
OT Cancellation Note ? ?Patient Details ?Name: FRANCK VINAL ?MRN: [id number] ?DOB: [birth year] ? ? ?Cancelled Treatment:    Reason Eval/Treat Not Completed: Medical issues which prohibited therapy.  Pt back in bed on Bipap.  Will attempt to come back and see in the am tomorrow.  ? ?Wayde Gopaul OTR/L ?04/30/2022, 4:21 PM ?

## 2022-04-30 NOTE — Progress Notes (Addendum)
? ?Progress Note ? ?Patient Name: [name] ?Date of Encounter: 04/30/2022 ? ?[facility] Cardiologist: Fransico Him, MD  ? ?Subjective  ? ?Patient aphonic. Working with ocupational therapy and sitting on the side of the bed. Wearing nasal cannula with 10 L supplemental oxygen. Alternated between Bi-PAP and HFNC at 15 L/min overnight  ? ?Inpatient Medications  ?  ?Scheduled Meds: ? dorzolamide  1 drop Both Eyes BID  ? heparin injection (subcutaneous)  5,000 Units Subcutaneous Q8H  ? insulin aspart  0-15 Units Subcutaneous TID WC  ? insulin aspart  0-5 Units Subcutaneous QHS  ? metoprolol tartrate  5 mg Intravenous Q6H  ? mometasone-formoterol  2 puff Inhalation BID  ? sodium chloride flush  3 mL Intravenous Q12H  ? umeclidinium bromide  1 puff Inhalation Daily  ? vancomycin variable dose per unstable renal function (pharmacist dosing)   Does not apply See admin instructions  ? ?Continuous Infusions: ? sodium chloride 100 mL/hr at 04/30/22 0229  ? sodium chloride 500 mL (04/26/22 0006)  ? ampicillin (OMNIPEN) IV 2 g (04/30/22 2119)  ? cefTRIAXone (ROCEPHIN)  IV 2 g (04/29/22 2227)  ? ?PRN Meds: ?sodium chloride, acetaminophen, HYDROmorphone (DILAUDID) injection, levalbuterol, LORazepam, ondansetron (ZOFRAN) IV, sodium chloride flush  ? ?Vital Signs  ?  ?Vitals:  ? 04/30/22 0039 04/30/22 4174 04/30/22 0540 04/30/22 0735  ?BP: 128/89  (!) 156/78   ?Pulse: (!) 120  (!) 109   ?Resp: (!) 32  (!) 24   ?Temp:  98.4 ?F (36.9 ?C)    ?TempSrc:  Axillary    ?SpO2: 100%  99% 96%  ?Weight:      ?Height:      ? ? ?Intake/Output Summary (Last 24 hours) at 04/30/2022 0757 ?Last data filed at 04/30/2022 0323 ?Gross per 24 hour  ?Intake 1405.46 ml  ?Output --  ?Net 1405.46 ml  ? ? ?  04/28/2022  ?  5:00 AM 04/27/2022  ?  4:21 AM 04/26/2022  ?  4:06 AM  ?Last 3 Weights  ?Weight (lbs) 216 lb 4.3 oz 227 lb 4.7 oz 229 lb 11.5 oz  ?Weight (kg) 98.1 kg 103.1 kg 104.2 kg  ?   ? ?Telemetry  ?  ?Sinus tachycardia, HR in the 100s-110s -  Personally Reviewed ? ?ECG  ?  ?No new tracings since 5/14 - Personally Reviewed ? ?Physical Exam  ? ?GEN: Frail, chronically ill, elderly gentleman in no acute distress. Sitting on the side of the bed ?Neck: No JVD ?Cardiac: Regular rhythm, tachycardic. No murmurs  ?Respiratory: Decreased breath sounds, crackles  ?GI: Soft, nontender, non-distended  ?MS: No edema; No deformity. ?Neuro:  Nonfocal  ?Psych: Normal affect  ? ?Labs  ?  ?High Sensitivity Troponin:   ?Recent Labs  ?Lab 04/25/22 ?1104 04/25/22 ?1452 04/28/22 ?0322 04/28/22 ?0654  ?TROPONINIHS 18* 15 50* 56*  ?   ?Chemistry ?Recent Labs  ?Lab 04/26/22 ?0245 04/27/22 ?0412 04/28/22 ?0814 04/28/22 ?0654 04/29/22 ?4818 04/29/22 ?1418 04/30/22 ?0603  ?NA 135 139 141  --  148* 153* 149*  ?K 3.6 3.7 3.7  --  3.9 3.5 3.7  ?CL 94* 100 99  --  105 105 107  ?CO2 '29 30 30  '$ --  '29 29 27  '$ ?GLUCOSE 143* 218* 213*  --  151* 136* 163*  ?BUN 17 24* 27*  --  41* 58* 73*  ?CREATININE 0.91 0.74 0.92  --  1.58* 2.35* 3.28*  ?CALCIUM 8.8* 9.5 9.7  --  9.3 9.0 8.6*  ?MG  2.1 2.2 2.2  --   --   --   --   ?PROT  --   --   --  6.4*  --  6.5  --   ?ALBUMIN  --   --   --  2.4*  --  2.4*  --   ?AST  --   --   --  11*  --  13*  --   ?ALT  --   --   --  12  --  11  --   ?ALKPHOS  --   --   --  103  --  96  --   ?BILITOT  --   --   --  0.7  --  0.7  --   ?GFRNONAA >60 >60 >60  --  44* 28* 19*  ?ANIONGAP '12 9 12  '$ --  14 19* 15  ?  ?Lipids No results for input(s): CHOL, TRIG, HDL, LABVLDL, LDLCALC, CHOLHDL in the last 168 hours.  ?Hematology ?Recent Labs  ?Lab 04/27/22 ?0412 04/28/22 ?0322 04/29/22 ?1418  ?WBC 16.8* 18.6* 20.6*  ?RBC 4.59 5.21 5.05  ?HGB 13.4 15.1 14.8  ?HCT 41.9 47.6 46.7  ?MCV 91.3 91.4 92.5  ?MCH 29.2 29.0 29.3  ?MCHC 32.0 31.7 31.7  ?RDW 13.3 13.2 13.7  ?PLT 364 412* 429*  ? ?Thyroid  ?Recent Labs  ?Lab 04/27/22 ?0412  ?TSH 0.617  ?  ?BNP ?Recent Labs  ?Lab 04/25/22 ?1104  ?BNP 540.9*  ?  ?DDimer  ?Recent Labs  ?Lab 04/26/22 ?0245  ?DDIMER 2.96*  ?  ? ?Radiology  ?  ?CT  ANGIO HEAD NECK W WO CM ? ?Result Date: 04/28/2022 ?CLINICAL DATA:  Subarachnoid hemorrhage follow-up. EXAM: CT ANGIOGRAPHY HEAD AND NECK TECHNIQUE: Multidetector CT imaging of the head and neck was performed using the standard protocol during bolus administration of intravenous contrast. Multiplanar CT image reconstructions and MIPs were obtained to evaluate the vascular anatomy. Carotid stenosis measurements (when applicable) are obtained utilizing NASCET criteria, using the distal internal carotid diameter as the denominator. RADIATION DOSE REDUCTION: This exam was performed according to the departmental dose-optimization program which includes automated exposure control, adjustment of the mA and/or kV according to patient size and/or use of iterative reconstruction technique. CONTRAST:  37m OMNIPAQUE IOHEXOL 350 MG/ML SOLN COMPARISON:  04/25/2022 FINDINGS: CTA NECK FINDINGS Aortic arch: Atheromatous plaque which is extensive. Two vessel branching. Right carotid system: Diffuse atheromatous plaque affecting the common carotid and proximal ICA. No flow limiting stenosis, beading, or ulceration. Left carotid system: Diffuse atheromatous plaque especially affecting the common and proximal internal carotid arteries. No flow limiting stenosis or ulceration. Vertebral arteries: Proximal subclavian atherosclerosis on both sides. The left vertebral artery is dominant. Proximal left vertebral artery kink from tortuosity. Diminutive right vertebral artery which is not seen proximally but is flowing from muscular branches of the distal V2 segment. Skeleton: No acute osseous finding or aggressive bone lesion. Ventral spinal canal gas collection at C3-4 and to a smaller extent at C4-5, usually herniation containing vacuum phenomenon. 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 Upper chest: Emphysema. Pulmonary opacity better assessed on recent chest CT. Review of the MIP images confirms the above findings CTA HEAD FINDINGS  Anterior circulation: Extensive atheromatous plaque along the carotid siphons. 50% narrowing at the right anterior genu. No branch occlusion, beading, or aneurysm. Posterior circulation: Strong left vertebral artery dominance with minimal contribution of the right vertebral artery to the basilar. The vertebral and basilar arteries are diffusely patent. No PCA  branch occlusion, beading, or aneurysm Venous sinuses: Diffusely patent Anatomic variants: As above Review of the MIP images confirms the above findings IMPRESSION: 1. No emergent finding. No aneurysm or vascular malformation as a cause of subarachnoid findings. 2. No flow seen in the non dominant proximal right vertebral artery with distal reconstitution. 3. Diffuse atherosclerosis with up to 50% narrowing of the right intracranial ICA. 4. Gas in the ventral canal at C3-4 without evidence of underlying acute osseous disease, usually gas containing herniation. 5. 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 Electronically Signed   By: Jorje Guild M.D.   On: 04/28/2022 08:51  ? ?DG CHEST PORT 1 VIEW ? ?Result Date: 04/29/2022 ?CLINICAL DATA:  Acute respiratory failure with hypoxia EXAM: PORTABLE CHEST 1 VIEW COMPARISON:  Chest radiograph 1 day prior FINDINGS: Median sternotomy wires are unchanged. The cardiomediastinal silhouette is stable, with unchanged calcified atherosclerotic plaque in the aortic arch. Increased interstitial markings are again seen throughout both lungs but worse on the left, overall unchanged. Retrocardiac opacity likely reflecting atelectasis is unchanged. There is no new or worsening focal airspace disease. There is no significant pleural effusion. There is no pneumothorax. There is no acute osseous abnormality. IMPRESSION: Overall no significant interval change in lung aeration since 04/28/2022 with findings again concerning for asymmetric pulmonary edema and left basilar atelectasis. Electronically Signed   By: Valetta Mole M.D.   On:  04/29/2022 08:23  ? ?DG Swallowing Func-Speech Pathology ? ?Result Date: 04/29/2022 ?Table formatting from the original result was not included. Objective Swallowing Evaluation: Type of Study: MBS-Modified Barium Swa

## 2022-04-30 NOTE — Progress Notes (Signed)
Pharmacy Antibiotic Note ? ?[name] is a 81 y.o. male admitted on 04/25/2022 with respiratory failure d/t COPD and CHF, now w/ MRI concerning for meningitis.  Pharmacy has been consulted for vancomycin, ceftriaxone, and ampicillin dosing. ? ?Patient has remained afebrile. WBC are trending up, currently at 20.6. Of note, SCr has significantly raised since 5/15 (1.58>2.35>3.28). A random vancomycin level was obtained this morning and was 38.  ? ?5/11 BCx: Streptococcus anginosis  ? ?Plan: ?Hold vancomycin again on 5/16 and repeat vancomycin random level with AM labs ?Continue Ceftriaxone 2g IV Q12H. ?Continue Ampicillin to 2g IV Q8H. ? ?Height: '5\' 6"'$  (167.6 cm) ?Weight: 98.1 kg (216 lb 4.3 oz) ?IBW/kg (Calculated) : 63.8 ? ?Temp (24hrs), Avg:97.7 ?F (36.5 ?C), Min:97.1 ?F (36.2 ?C), Max:98.6 ?F (37 ?C) ? ?Recent Labs  ?Lab 04/25/22 ?1104 04/25/22 ?1120 04/26/22 ?0245 04/27/22 ?3903 04/28/22 ?0092 04/29/22 ?3300 04/29/22 ?1418 04/30/22 ?0603  ?WBC 16.0*  --   --  16.8* 18.6*  --  20.6*  --   ?CREATININE 1.02  --    < > 0.74 0.92 1.58* 2.35* 3.28*  ?LATICACIDVEN  --  1.6  --   --   --   --   --   --   ?VANCORANDOM  --   --   --   --   --   --   --  38  ? < > = values in this interval not displayed.  ? ?  ?Estimated Creatinine Clearance: 20.3 mL/min (A) (by C-G formula based on SCr of 3.28 mg/dL (H)).   ? ?No Known Allergies ? ?5/11 BCx: Streptococcus anginosis  ? ?Thank you for allowing pharmacy to be a part of this patient?s care. ? ?Joseph Art, Pharm.D. ?PGY-1 Pharmacy Resident ?04/30/2022 8:31 AM ? ?

## 2022-04-30 NOTE — Progress Notes (Signed)
Patient is refusing to get his blood sugar and vital signs taken. Patient is also refusing peri and foley care and taking any of his medications. Informed the patient's wife and on call MD. ?

## 2022-04-30 NOTE — TOC Progression Note (Signed)
Transition of Care (TOC) - Progression Note  ? ? ?Patient Details  ?Name: [name] ?MRN: [id number] ?Date of Birth: June 06, 1941 ? ?Transition of Care (TOC) CM/SW Contact  ?Milas Gain, LCSWA ?Phone Number: ?04/30/2022, 4:17 PM ? ?Clinical Narrative:    ? ?CSW continues to follow. CSW will continue to follow and assist with patients dc planning needs. ? ?Expected Discharge Plan: [place] ?Barriers to Discharge: Continued Medical Work up ? ?Expected Discharge Plan and Services ?Expected Discharge Plan: [place] ?In-house Referral: Clinical Social Work ?  ?  ?Living arrangements for the past 2 months: [place] ?                ?  ?  ?  ?  ?  ?  ?  ?  ?  ?  ? ? ?Social Determinants of Health (SDOH) Interventions ?  ? ?Readmission Risk Interventions ?   ? View : No data to display.  ?  ?  ?  ? ? ?

## 2022-05-01 DIAGNOSIS — J9621 Acute and chronic respiratory failure with hypoxia: Secondary | ICD-10-CM | POA: Diagnosis not present

## 2022-05-01 DIAGNOSIS — G039 Meningitis, unspecified: Secondary | ICD-10-CM | POA: Diagnosis not present

## 2022-05-01 LAB — METANEPHRINES, URINE, 24 HOUR
Metaneph Total, Ur: 468 ug/L
Metanephrines, 24H Ur: 225 ug/24 hr (ref 58–276)
Normetanephrine, 24H Ur: 336 ug/24 hr (ref 156–729)
Normetanephrine, Ur: 699 ug/L
Total Volume: 480

## 2022-05-01 LAB — BASIC METABOLIC PANEL
Anion gap: 8 (ref 5–15)
BUN: 57 mg/dL — ABNORMAL HIGH (ref 8–23)
CO2: 28 mmol/L (ref 22–32)
Calcium: 8.4 mg/dL — ABNORMAL LOW (ref 8.9–10.3)
Chloride: 114 mmol/L — ABNORMAL HIGH (ref 98–111)
Creatinine, Ser: 1.6 mg/dL — ABNORMAL HIGH (ref 0.61–1.24)
GFR, Estimated: 44 mL/min — ABNORMAL LOW (ref >60)
Glucose, Bld: 182 mg/dL — ABNORMAL HIGH (ref 70–99)
Potassium: 3.3 mmol/L — ABNORMAL LOW (ref 3.5–5.1)
Sodium: 150 mmol/L — ABNORMAL HIGH (ref 135–145)

## 2022-05-01 LAB — CBC
HCT: 40.1 % (ref 39.0–52.0)
Hemoglobin: 12.7 g/dL — ABNORMAL LOW (ref 13.0–17.0)
MCH: 29.7 pg (ref 26.0–34.0)
MCHC: 31.7 g/dL (ref 30.0–36.0)
MCV: 93.9 fL (ref 80.0–100.0)
Platelets: 346 10*3/uL (ref 150–400)
RBC: 4.27 MIL/uL (ref 4.22–5.81)
RDW: 13.8 % (ref 11.5–15.5)
WBC: 17.5 10*3/uL — ABNORMAL HIGH (ref 4.0–10.5)
nRBC: 0 % (ref 0.0–0.2)

## 2022-05-01 LAB — GLUCOSE, CAPILLARY
Glucose-Capillary: 141 mg/dL — ABNORMAL HIGH (ref 70–99)
Glucose-Capillary: 156 mg/dL — ABNORMAL HIGH (ref 70–99)
Glucose-Capillary: 164 mg/dL — ABNORMAL HIGH (ref 70–99)
Glucose-Capillary: 175 mg/dL — ABNORMAL HIGH (ref 70–99)
Glucose-Capillary: 193 mg/dL — ABNORMAL HIGH (ref 70–99)
Glucose-Capillary: 219 mg/dL — ABNORMAL HIGH (ref 70–99)

## 2022-05-01 LAB — CSF CULTURE W GRAM STAIN
Culture: NO GROWTH
Special Requests: NORMAL

## 2022-05-01 LAB — VANCOMYCIN, RANDOM: Vancomycin Rm: 21

## 2022-05-01 MED ORDER — POTASSIUM CHLORIDE 10 MEQ/100ML IV SOLN
10.0000 meq | INTRAVENOUS | Status: AC
  Administered 2022-05-01 (×2): 10 meq via INTRAVENOUS
  Filled 2022-05-01 (×2): qty 100

## 2022-05-01 MED ORDER — VANCOMYCIN HCL IN DEXTROSE 1-5 GM/200ML-% IV SOLN
1000.0000 mg | INTRAVENOUS | Status: DC
  Filled 2022-05-01: qty 200

## 2022-05-01 MED ORDER — LINEZOLID 600 MG/300ML IV SOLN
600.0000 mg | Freq: Two times a day (BID) | INTRAVENOUS | Status: DC
  Administered 2022-05-01 (×2): 600 mg via INTRAVENOUS
  Filled 2022-05-01 (×3): qty 300

## 2022-05-01 NOTE — Progress Notes (Signed)
Speech Language Pathology Treatment: Dysphagia;Cognitive-Linquistic  ?Patient Details ?Name: [name] ?MRN: [id number] ?DOB: [birth year] ?Today's Date: 05/01/2022 ?Time: 1443-1540 ?SLP Time Calculation (min) (ACUTE ONLY): 40 min ? ?Assessment / Plan / Recommendation ?Clinical Impression ? Pt was seen at bedside following MBS completed 04/29/22. Discussed results with pt and wife, including silent aspiration noted on study. Suction was set up to facilitate thorough oral care. Pt completed oral care after set up. Pt was given individual ice chips after oral care, and was encouraged to take his time. Throat clearing noted x1, however, that is not a reliable indication of airway compromise. Pt was encouraged to try taking a deep breath, then producing "haaaaaa", or "hmmmmm",. No voicing was noted. Pt and wife were educated regarding ENT report, including right fold paralysis. Pt will benefit from continued (outpatient or home health) ST intervention following acute hospitalization.  ? ?Pt has been NPO for 3 days now. Recommend consideration of nonoral feeding method given high risk of aspiration with PO intake. Discussed with RN.  ?  ?HPI HPI: 81 y.o. male adm 5/11 with edema and orthopnea with acute on chronic CHF and COPD. Pt also with acute onset dysphonia. 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 MRI 5/13 limited but with concern for CNS infection and possible small acute infarct in the L frontotemporal region. LP  5/14 + for bacterial meningitis. PMhx: COPD on O2, HFrEF, DM2, CAD s/p CABG, depression, macular degeneration, HTN, HLD.  ? ?LP consistent with meningitis; per ENT = Left true vocal fold moves normally. RIGHT VF fixed in paramedian position with no apparent movement. Concern for possible acute infarct on MRI, ? SAH on CT. ?  ?   ?SLP Plan ? Continue with current plan of care ? ?  ?  ?Recommendations for follow up therapy are one component of a  multi-disciplinary discharge planning process, led by the attending physician.  Recommendations may be updated based on patient status, additional functional criteria and insurance authorization. ?  ? ?Recommendations  ?Diet recommendations: NPO ?Medication Administration: Via alternative means  ?   ?    ?   ? ? ? ? Oral Care Recommendations: Oral care BID;Oral care prior to ice chip/H20;Staff/trained caregiver to provide oral care ?Follow Up Recommendations: Other (comment) (TBD) ?Assistance recommended at discharge: Frequent or constant Supervision/Assistance ?SLP Visit Diagnosis: Dysphagia, pharyngoesophageal phase (R13.14);Dysphagia, oropharyngeal phase (R13.12);Aphonia (R49.1) ?Plan: Continue with current plan of care ? ? ? ? ?  ?  ? ?Antonisha Waskey B. Raquel Sayres, MSP, CCC-SLP ?Speech Language Pathologist ?Office: [phone] ? ?Shonna Chock ?05/01/2022, 11:32 AM ?

## 2022-05-01 NOTE — Progress Notes (Signed)
Pt continues to tolerate the high flow salter nasal cannula.  RN will call RT if he requires BIPAP throughout the night. ?

## 2022-05-01 NOTE — Progress Notes (Signed)
[facility] ?NEPHROLOGY PROGRESS NOTE ? ?Assessment/ Plan: ?Pt is a 81 y.o. yo male  with history of hypertension, IDDM, COPD with chronic hypoxia on 2 L of oxygen, CAD status post CABG, chronic neck pain, was admitted for acute on chronic respiratory failure, seen as a consultation for acute kidney injury. ? ?#Acute kidney injury, oliguric due to contrast nephropathy (IV contrast on 5/11,5/13 and 5/14) concomitant with use of diuretics, decreased oral intake and use of vancomycin. ?UA without protein however spot urine PCR around 0.75,pending SPEP and free light chain ?The CT scan of abdomen pelvis ruled out hydronephrosis. ?Urine output increased to 2.6 L and creatinine level trending down significantly with IV hydration.  The sodium level is improving as well therefore continue hypotonic fluid.  No need for dialysis. ?Strict ins and outs, daily lab. ?Please avoid IV contrast and vancomycin if possible. ? ?#Acute on chronic hypoxic respiratory failure presumably due to pneumonia/COPD exacerbation.  Continue antibiotics, bronchodilators and oxygen.   ? ?#Hypernatremia, hypovolemic/free water deficit: Sodium level improving with D5W.  He is unable to drink water because of dysphagia. ? ?#Acute bacterial meningitis: Status post lumbar puncture.  Seen by ID and neurology.  Vancomycin level supratherapeutic and currently on ceftriaxone.  ID is following.  I recommend to avoid vancomycin if possible because of AKI. ?  ?#Multifocal pneumonia: On broad-spectrum antibiotics. ?  ?#Aphonia and dysphagia: SLP eval, neurology, ENT and  per primary team. ? ?#Hypokalemia: Replete IV potassium level as he is not able to take orally. ? ?Discussed with patient's wife who was present at the bedside.  ? ?Subjective: Seen and examined at bedside.  He looks more alert and comfortable today.  Urine output recorded 2.6 L in 24 hours.  He still has aphonia therefore difficult to comprehend. ?Objective ?Vital signs in last  24 hours: ?Vitals:  ? 04/30/22 2106 05/01/22 0414 05/01/22 0726 05/01/22 0745  ?BP: 136/89 127/72 138/68   ?Pulse: (!) 116 87 99   ?Resp: 20 19 (!) 22   ?Temp:  98.3 ?F (36.8 ?C) 98.9 ?F (37.2 ?C)   ?TempSrc:  Axillary Axillary   ?SpO2: 96% 92% 91% 94%  ?Weight:  103.7 kg    ?Height:      ? ?Weight change:  ? ?Intake/Output Summary (Last 24 hours) at 05/01/2022 1113 ?Last data filed at 05/01/2022 0400 ?Gross per 24 hour  ?Intake 1462.71 ml  ?Output 2675 ml  ?Net -1212.29 ml  ? ? ? ? ? ?Labs: ?Basic Metabolic Panel: ?Recent Labs  ?Lab 04/30/22 ?0603 04/30/22 ?1616 05/01/22 ?0344  ?NA 149* 152* 150*  ?K 3.7 3.6 3.3*  ?CL 107 112* 114*  ?CO2 '27 29 28  ' ?GLUCOSE 163* 123* 182*  ?BUN 73* 74* 57*  ?CREATININE 3.28* 2.86* 1.60*  ?CALCIUM 8.6* 8.3* 8.4*  ? ?Liver Function Tests: ?Recent Labs  ?Lab 04/28/22 ?0654 04/29/22 ?1418  ?AST 11* 13*  ?ALT 12 11  ?ALKPHOS 103 96  ?BILITOT 0.7 0.7  ?PROT 6.4* 6.5  ?ALBUMIN 2.4* 2.4*  ? ?No results for input(s): LIPASE, AMYLASE in the last 168 hours. ?No results for input(s): AMMONIA in the last 168 hours. ?CBC: ?Recent Labs  ?Lab 04/25/22 ?1104 04/25/22 ?1706 04/27/22 ?0412 04/28/22 ?0322 04/29/22 ?1418 05/01/22 ?0344  ?WBC 16.0*  --  16.8* 18.6* 20.6* 17.5*  ?NEUTROABS  --   --  13.8*  --  16.1*  --   ?HGB 14.0   < > 13.4 15.1 14.8 12.7*  ?HCT 42.3   < >  41.9 47.6 46.7 40.1  ?MCV 89.4  --  91.3 91.4 92.5 93.9  ?PLT 282  --  364 412* 429* 346  ? < > = values in this interval not displayed.  ? ?Cardiac Enzymes: ?No results for input(s): CKTOTAL, CKMB, CKMBINDEX, TROPONINI in the last 168 hours. ?CBG: ?Recent Labs  ?Lab 04/30/22 ?1136 04/30/22 ?1523 04/30/22 ?2232 05/01/22 ?0413 05/01/22 ?1829  ?GLUCAP 146* 122* 155* 175* 219*  ? ? ?Iron Studies: No results for input(s): IRON, TIBC, TRANSFERRIN, FERRITIN in the last 72 hours. ?Studies/Results: ?DG Swallowing Func-Speech Pathology ? ?Result Date: 04/29/2022 ?Table formatting from the original result was not included. Objective Swallowing  Evaluation: Type of Study: MBS-Modified Barium Swallow Study  Patient Details Name: [name]: [id number] Date of Birth: [birth year] Today's Date: 04/29/2022 Time: SLP Start Time (ACUTE ONLY): 9381 -SLP Stop Time (ACUTE ONLY): 1610 SLP Time Calculation (min) (ACUTE ONLY): 29 min Past Medical History: Past Medical History: Diagnosis Date  Anxiety   CAD (coronary artery disease)    s/p CABG with LIMA to LAD, SVG to diag, SVG to OM, SVG to RCA cath 2002 Dr. Radford Pax  CAP (community acquired pneumonia) due to Pneumococcus ([place]) 03/03/2017  Cataract   Chronic combined systolic and diastolic CHF (congestive heart failure) ([place]) 04/28/2018  Chronic kidney disease   Claudication (HCC)   normal ABIs  COPD (chronic obstructive pulmonary disease) ([place])   Depression   Diabetes mellitus without complication ([place])   Dyslipidemia   Hypersomnia   Hypertension   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[place])   EF 40% with apical AK at cath intolerant to ACE I and ARBS, EF 43% by nuclear stress test 08/2010  Kidney stones   Macular degeneration   Morbid obesity ([place]) 04/28/2018 Past Surgical History: Past Surgical History: Procedure Laterality Date  CORONARY ARTERY BYPASS GRAFT    with LIMA to LAD, SVG to diag, SVG to OM, SVG to RCA cath 2002 Dr. Radford Pax  urologic procedure for fertility   HPI: 81 y.o. male adm 5/11 with edema and orthopnea with acute on chronic CHF and COPD. Pt also with acute onset dysphonia. 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 MRI 5/13 limited but with concern for CNS infection and possible small acute infarct in the L frontotemporal region. LP  5/14 + for bacterial meningitis. 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  Recommendations may  be updated based on patient status, additional functional criteria and insurance authorization. 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 Orally, he has extra lingual manipulation and slow transit of boluses with premature spillage of thinner consistencies and increased lingual residue of thicker ones. Thin and nectar thick liquids that reach the pyriform sinuses before the swallow are aspirated during the swallow in the setting of incomplete airway closure. Honey thick liquids and purees are not as immediately aspirated, but there is more residue at the pyriform sinuses as well as residue within the cervical esophagus/outpouching that backflow into the pyriform sinuses, with small amounts of aspiration that occurs after the swallow when imaging was turned off.  Aspiration across consistencies was largely silent (PAS#8) with the exception of aspiration of a larger volume of thin liquids that elicited a throat clear. Pt could not cognitively carry out strategies during this study. Recommend that he remain NPO. In light of current respiratory status and intermittent need for BiPAP, would consider offering meds via alternate route as pharyngeal residue was aspirated during MBS. If MD agrees, could offer one or two ice chips after oral care to provide some moisture and allow for use of swallowing musculature. SLP will continue to follow. May also wish to consider ENT consult given report of acute onset aphonia and frequency of aspiration. SLP Visit Diagnosis Dysphagia, pharyngoesophageal phase (R13.14);Dysphagia, oropharyngeal phase (R13.12) Impact on safety and function Severe aspiration risk     04/29/2022   4:00 PM Treatment Recommendations Treatment Recommendations Therapy as outlined in treatment plan below     04/29/2022    4:00 PM Prognosis Prognosis for Safe Diet Advancement Good Barriers to Reach Goals Cognitive deficits   04/29/2022   4:00 PM Diet Recommendations SLP Diet Recommendations NPO Medication Administration Via a

## 2022-05-01 NOTE — Progress Notes (Signed)
Pharmacy Antibiotic Note ? ?[name] is a 81 y.o. male admitted on 04/25/2022 with respiratory failure d/t COPD and CHF, now w/ MRI concerning for meningitis.  Pharmacy has been consulted for vancomycin dosing. ? ?Patient has remained afebrile. WBC = 17.5.  SCr now trending down (3.28> 2.86>1.6). A random vancomycin level was obtained this morning and was 21 (was 38 on 5/16) ? ?5/11 BCx: Streptococcus anginosis  ? ?Plan: ?Vancomycin '1000mg'$  IV q24h ?Will plan for vancomycin levels later this week ?Continue Ceftriaxone 2g IV Q12H. ? ? ?Height: '5\' 6"'$  (167.6 cm) ?Weight: 103.7 kg (228 lb 9.9 oz) ?IBW/kg (Calculated) : 63.8 ? ?Temp (24hrs), Avg:98.3 ?F (36.8 ?C), Min:97.8 ?F (36.6 ?C), Max:98.9 ?F (37.2 ?C) ? ?Recent Labs  ?Lab 04/25/22 ?1104 04/25/22 ?1120 04/26/22 ?0245 04/27/22 ?7681 04/28/22 ?1572 04/29/22 ?6203 04/29/22 ?1418 04/30/22 ?0603 04/30/22 ?1616 05/01/22 ?0344  ?WBC 16.0*  --   --  16.8* 18.6*  --  20.6*  --   --  17.5*  ?CREATININE 1.02  --    < > 0.74 0.92 1.58* 2.35* 3.28* 2.86* 1.60*  ?LATICACIDVEN  --  1.6  --   --   --   --   --   --   --   --   ?VANCORANDOM  --   --   --   --   --   --   --  38  --  21  ? < > = values in this interval not displayed.  ? ?  ?Estimated Creatinine Clearance: 42.9 mL/min (A) (by C-G formula based on SCr of 1.6 mg/dL (H)).   ? ?No Known Allergies ? ?5/11 BCx: Streptococcus anginosis  ? ?Thank you for allowing pharmacy to be a part of this patient?s care. ? ?Hildred Laser, PharmD ?Clinical Pharmacist ?**Pharmacist phone directory can now be found on [email or web address] ([email or web address]).  Listed under [facility]. ? ? ?

## 2022-05-01 NOTE — Progress Notes (Signed)
?Progress Note ? ?Patient: [name] IHK:[id number] DOB: Sep 08, 1941  ?DOA: 04/25/2022  DOS: 05/01/2022  ?  ?Brief hospital course: ?YOSHI MANCILLAS is a 81 y.o. 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 He was admitted for acute on chronic hypoxic respiratory failure with suspicion of acute CHF and/or pneumonia with subsequent CTA chest showing no PE. He was afebrile with leukocytosis and low back pain in addition to his chronic neck pain. Imaging revealed a presacral density, adrenal mass, upper normal sized mediastinal lymph nodes. CT head showed nonspecific 94m hyperdense focus if periventricular parietal white matter for which subsequent MRI was ordered. This was severely motion degraded and limited, but revealed abnormal diffusion signal abnormality layering within the occipital horns of lateral ventricles and Sylvian fissure suggestive of SAH vs. meningitis. Neurology was consulted, performed LP which is consistent with bacterial meningitis. 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 ? ?Assessment and Plan: ? ?Acute on chronic hypoxic respiratory failure:  ?Presumably due primarily to aspiration pneumonia, ?CTA chest without PE.  ?There is evidence of atelectasis, pneumonia, and interstitial edema.  ?Continue to supplement oxygen as needed to maintain normal respiratory effort and SpO2 >89%.  ?Patient is confirmed to be DNR/DNI. ?Continue baseline COPD medications and prn albuterol. No wheezing.  ?Encourage frequent incentive spirometry. ? ?Bacterial meningitis:  ?Negative gram stain, though Neutrophils severely elevated with low glucose, high protein on LP 5/14.  ?- Started high dose vancomycin, ampicillin, ceftriaxone at high  doses. ID consulted, deescalated to ceftriaxone meningitic dosing alone. DC vancomycin, ampicillin. ?- Continue droplet precautions.  ?- Household contacts over the past 7 days have been given chemoprophylaxis on 5/14. No further needs in this regard per multiple physicians. ?- Plan to check MRI brain/C spine to evaluate for collections and further define infarct pending improvement in renal function to allow use of contrast.  ?- HSV PCR not back yet, but low enough suspicion not to cover with acyclovir in setting of renal failure. ? ?Acute renal failure, resolving:  ?Secondary to multiple insults including lasix, NPO, repeated contrast dye loads, vancomycin.  ?Continue D5W @ 75 cc/hr and monitor in AM.  ?Will need to discuss NGT at least for free water in the short term given ongoing aspiration risk ? ?Acute urinary retention:  ?Continue Foley catheter, urine output improving ?Dark maroon/bloody urine today - continue to follow ? ?Severe lumbago ?-Likely in the setting of meningitis ?-Appears to improving over the past 24 hours ?-Continue tylenol, robaxin, heating pad -Dilaudid for breakthrough pain only -he has used sparingly over the past 24 hours. ? ?Acute on chronic HFREF:  ?LVEF 45-50%, based on more recent echo, LVEF has improved to 50-55%.  ?Holding lasix - cardiology consulted.  ?- Continue metoprolol, giving IV scheduled while NPO. ? ?Acute CVA:  ?Suggested by 470mdiffusion signal abnormality involving the posterior left frontotemporal region on limited MRI.  ?- Ok to start ASA, though made strict NPO due to ongoing silent aspiration.  ?- Repeat MRI in coming days once renal function improves. ? ?CAD s/p CABG:  ?Troponin initially negative at 1481subsequent evidence of demand ischemia (up to 567due to sepsis. ACS is not currently suspected.  ?- Continue aspirin, metoprolol, statin ?- Cardiology  consulted, no changes to management or ischemic work up currently planned. ? ?Multifocal pneumonia:  ?-  Increasing consolidations on CTA coinciding with respiratory deterioration. ?- Started abx and will continue as above.  ?- Monitor blood cultures drawn at admission (unchanged 1 of 4 S. anginosis and other is NGTD x5 days) ? ?Presacral mass:  ?Indeterminate, incidental. Not rim-enhancing. ?- Discussed with IR, suggests PET-CT prior to selecting a target for biopsy in the outpatient setting ? 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 ?- Renin and aldosterone pending ?- Cortisol is 49.2. Metanephrines negative. ?- Needs PET to delineate this as well as presacral density on CT.  ? ?Neck mass, likely lipoma:  ?CT showed lipoma - follow PET as above ? ?Aphonia, history of dysphagia:  ?Questionably related to left frontotemporal infarct suggested by MRI.  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 Consider CT neck to r/o malignancy.  ?- SLP evaluation will be continued -remains high risk for aspiration ?- Esophagram Feb 2022 showed mild dysmotility without stricture or mass.  ? ?Tremor: Suspect this is due to CNS infection. Not consistent with seizure. ? ?NIDT2DM: HbA1c 6.5%, controlled.  ?- Continue moderate SSI, hold metformin with contrast exposures.  ? ?Obesity: Estimated body mass index is 36.9 kg/m? as calculated from the following: ?  Height as of this encounter: '5\' 6"'$  (1.676 m). ?  Weight as of this encounter: 103.7 kg. ? ?Subjective: Awake alert this morning denies nausea vomiting diarrhea constipation headache fevers chills or chest pain.  Speech is still somewhat limited and quiet but patient able to pronounce and enunciate moderately well today. ? ?Objective: ?Vitals:  ? 04/30/22 1540 04/30/22 1600 04/30/22 2106 05/01/22 0414  ?BP: 129/66  136/89 127/72  ?Pulse: (!) 104 (!) 103 (!) 116 87  ?Resp: (!) '21 18 20 19  '$ ?Temp: 97.8 ?F (36.6 ?C)   98.3 ?F (36.8 ?C)  ?TempSrc: Axillary   Axillary  ?SpO2: 98% 99% 96% 92%  ?Weight:    103.7 kg  ?Height:      ? ?General:  Pleasantly resting in  bed, No acute distress. ?HEENT: Speech quiet. ?Neck:  Without mass or deformity.  Trachea is midline. ?Lungs: Scant bibasilar rales. ?Heart:  Regular rate and rhythm.  Without murmurs, rubs, or gallops. ?Abdomen:  Soft, nontender, nondistended.  Without guarding or rebound. ?Extremities: Without cyanosis, clubbing, edema, or obvious deformity. ?Vascular:  Dorsalis pedis and posterior tibial pulses palpable bilaterally. ?Skin:  Warm and dry, no erythema, no ulcerations. ? ?Data Personally reviewed: ?CBC: ?Recent Labs  ?Lab 04/25/22 ?1104 04/25/22 ?1706 04/27/22 ?0412 04/28/22 ?0322 04/29/22 ?1418 05/01/22 ?0344  ?WBC 16.0*  --  16.8* 18.6* 20.6* 17.5*  ?NEUTROABS  --   --  13.8*  --  16.1*  --   ?HGB 14.0 13.9 13.4 15.1 14.8 12.7*  ?HCT 42.3 41.0 41.9 47.6 46.7 40.1  ?MCV 89.4  --  91.3 91.4 92.5 93.9  ?PLT 282  --  364 412* 429* 346  ? ? ?Basic Metabolic Panel: ?Recent Labs  ?Lab 04/26/22 ?0245 04/27/22 ?0412 04/28/22 ?0322 04/29/22 ?0240 04/29/22 ?1418 04/30/22 ?0603 04/30/22 ?1616 05/01/22 ?0344  ?NA 135 139 141 148* 153* 149* 152* 150*  ?K 3.6 3.7 3.7 3.9 3.5 3.7 3.6 3.3*  ?CL 94* 100 99 105 105 107 112* 114*  ?CO2 '29 30 30 29 29 27 29 28  '$ ?GLUCOSE 143* 218* 213* 151* 136* 163* 123* 182*  ?BUN 17 24* 27* 41* 58* 73* 74* 57*  ?CREATININE  0.91 0.74 0.92 1.58* 2.35* 3.28* 2.86* 1.60*  ?CALCIUM 8.8* 9.5 9.7 9.3 9.0 8.6* 8.3* 8.4*  ?MG 2.1 2.2 2.2  --   --   --   --   --   ? ? ?GFR: ?Estimated Creatinine Clearance: 42.9 mL/min (A) (by C-G formula based on SCr of 1.6 mg/dL (H)). ? ?HbA1C: ?No results for input(s): HGBA1C in the last 72 hours. ? ?CBG: ?Recent Labs  ?Lab 04/30/22 ?0829 04/30/22 ?1136 04/30/22 ?1523 04/30/22 ?2232 05/01/22 ?0413  ?GLUCAP 155* 146* 122* 155* 175*  ? ? ?Urine analysis: ?   ?Component Value Date/Time  ? COLORURINE AMBER (A) 04/30/2022 1610  ? APPEARANCEUR CLEAR 04/30/2022 0646  ? LABSPEC 1.025 04/30/2022 0646  ? PHURINE 5.0 04/30/2022 0646  ? GLUCOSEU NEGATIVE 04/30/2022 0646  ? HGBUR  MODERATE (A) 04/30/2022 0646  ? [place] NEGATIVE 04/30/2022 0646  ? [place] NEGATIVE 04/30/2022 0646  ? PROTEINUR NEGATIVE 04/30/2022 0646  ? NITRITE NEGATIVE 04/30/2022 0646  ? LEUKOCYTESUR NEGATIVE 04/30/2022 0

## 2022-05-01 NOTE — Progress Notes (Signed)
? ? ? ?  [facility] will sign off.   ?Medication Recommendations:  Per primary team.  ?Other recommendations (labs, testing, etc):  NA ?Follow up as an outpatient:  He can be followed as planned in Feb with Dr. Radford Pax at the yearly appt.  ? ?

## 2022-05-01 NOTE — Progress Notes (Signed)
? ?  Inpatient Rehab Admissions Coordinator : ? ?Per  OT therapy change in recommendations, patient was screened for CIR candidacy by Danne Baxter RN MSN.  At this time patient appears to be a potential candidate for CIR. I will place a rehab consult per protocol for full assessment. Please call me with any questions. ? ?Danne Baxter RN MSN ?Admissions Coordinator ?[phone] ?  ?

## 2022-05-01 NOTE — Progress Notes (Signed)
Occupational Therapy Treatment ?Patient Details ?Name: [name] ?MRN: [id number] ?DOB: 1941-09-12 ?Today's Date: 05/01/2022 ? ? ?History of present illness 81 y.o. male adm 5/11 with edema and orthopnea with acute on chronic CHF and COPD. 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 5/13 MRI with small SAH vs meningitis. 5/14 respiratory distress and LP with bacterial meningitis. PMhx: COPD on O2, HFrEF, DM2, CAD s/p CABG, depression, macular degeneration, HTN, HLD ?  ?OT comments ? Pt able to participate in selfcare tasks sit to stand during session with overall mod assist.  Still with disorientation but able to consistently follow one step commands with increased time and mod instructional/demonstrational cueing for multi step commands.  Oxygen sats remained greater than 95% on 10 Ls high flow throughout session with HR increasing up to 118 BPM with standing and transfers using the RW.  Feel he will continue to benefit from acute care OT at this time with further rehab at AIR level if O2 can be tolerated at a lower setting and endurance continues to improve.  Will continue to follow.   ? ?Recommendations for follow up therapy are one component of a multi-disciplinary discharge planning process, led by the attending physician.  Recommendations may be updated based on patient status, additional functional criteria and insurance authorization. ?   ?Follow Up Recommendations ? Acute inpatient rehab (3hours/day)  ?  ?Assistance Recommended at Discharge Frequent or constant Supervision/Assistance  ?Patient can return home with the following ? A little help with walking and/or transfers;A little help with bathing/dressing/bathroom;Assistance with cooking/housework;Assist for transportation;Help with stairs or ramp for entrance ?  ?Equipment Recommendations ? BSC/3in1  ?  ?Recommendations for Other Services Rehab consult ? ?  ?Precautions / Restrictions  Precautions ?Precautions: Fall;Other (comment) ?Precaution Comments: watch HR and sats ?Restrictions ?Weight Bearing Restrictions: No  ? ? ?  ? ?Mobility Bed Mobility ?Overal bed mobility: Needs Assistance ?  ?Rolling: Min assist ?Sidelying to sit: Mod assist ?  ?  ?  ?General bed mobility comments: Mod demonstrational cueing for initiating and completed rolling and supine to sit with help to lift trunk up to midline from laying on the bed. ?  ? ?Transfers ?Overall transfer level: Needs assistance ?Equipment used: Rolling walker (2 wheels) ?Transfers: Sit to/from Stand, Bed to chair/wheelchair/BSC ?Sit to Stand: Mod assist, From elevated surface ?  ?  ?Step pivot transfers: Mod assist ?  ?  ?General transfer comment: Mod demonstrational cueing for hand placement to push up off of the bed and the arm of the 3:1 when completing sit to stand instead of pulling up on the walker. ?  ?  ?Balance Overall balance assessment: Needs assistance ?Sitting-balance support: No upper extremity supported, Feet supported ?Sitting balance-Leahy Scale: Fair ?  ?  ?Standing balance support: Reliant on assistive device for balance, During functional activity ?Standing balance-Leahy Scale: Poor ?Standing balance comment: Pt needing at least one UE for support to maintain balance when standing during ADL tasks. ?  ?  ?  ?  ?  ?  ?  ?  ?  ?  ?  ?   ? ?ADL either performed or assessed with clinical judgement  ? ?ADL Overall ADL's : Needs assistance/impaired ?  ?  ?Grooming: Wash/dry hands;Wash/dry face;Sitting;Supervision/safety ?  ?Upper Body Bathing: Supervision/ safety;Sitting ?  ?Lower Body Bathing: Moderate assistance;Sit to/from stand ?  ?Upper Body Dressing : Maximal assistance;Sitting ?Upper Body Dressing Details (indicate cue type and reason): hospital  gown ?  ?  ?Toilet Transfer: Moderate assistance;Stand-pivot;BSC/3in1;Rolling walker (2 wheels) ?  ?Toileting- Clothing Manipulation and Hygiene: Moderate assistance;Sit to/from  stand ?  ?  ?  ?Functional mobility during ADLs: Moderate assistance (stand and take 4-5 steps 180 degrees to transfer from toilet to the bedside recliner.) ?General ADL Comments: Pt's HR in the low 100s while sitting EOB with O2 sats at 97-100% on 100 Ls high flow.  They maintained greater than 95% throughout bathing tasks sit to stand with HR increasing up to 118 BPM with standing during transfers, LB bathing, and toileting tasks.  Mod assist needed for sit to stand from the EOB as well as from the 3:1.  No report or sign of sharp pain episodes in his back or hips this session with movement as in previous session. ?  ? ? ?   ?   ?   ? ?Cognition Arousal/Alertness: Awake/alert ?Behavior During Therapy: Flat affect ?Overall Cognitive Status: Impaired/Different from baseline ?Area of Impairment: Orientation, Attention, Memory, Following commands, Safety/judgement, Awareness, Problem solving ?  ?  ?  ?  ?  ?  ?  ?  ?Orientation Level: Disoriented to, Place, Time, Situation ?Current Attention Level: Sustained ?Memory: Decreased short-term memory ?Following Commands: Follows one step commands consistently, Follows multi-step commands with increased time ?Safety/Judgement: Decreased awareness of deficits, Decreased awareness of safety ?Awareness: Intellectual ?Problem Solving: Slow processing, Requires verbal cues ?General Comments: Pt with decreased vocal sounds at time making it hard to understand.  When given three choices as to what city he was in, he was unable to pick when given increased time and therapist had to provide "[place]". ?  ?  ?   ?   ?   ?   ? ? ?Pertinent Vitals/ Pain       Pain Assessment ?Pain Assessment: Faces ?Pain Score: 0-No pain ? ?   ?   ? ?Frequency ? Min 2X/week  ? ? ? ? ?  ?Progress Toward Goals ? ?OT Goals(current goals can now be found in the care plan section) ? Progress towards OT goals: Progressing toward goals ? ?Acute Rehab OT Goals ?Patient Stated Goal: Pt did not state but did  report that it felt good to sit up on the EOB. ?OT Goal Formulation: With patient ?Time For Goal Achievement: 05/10/22 ?Potential to Achieve Goals: Good  ?Plan Discharge plan needs to be updated   ? ?   ?AM-PAC OT "6 Clicks" Daily Activity     ?Outcome Measure ? ? Help from another person eating meals?: A Little ?Help from another person taking care of personal grooming?: A Little ?Help from another person toileting, which includes using toliet, bedpan, or urinal?: A Lot ?Help from another person bathing (including washing, rinsing, drying)?: A Lot ?Help from another person to put on and taking off regular upper body clothing?: A Lot ?Help from another person to put on and taking off regular lower body clothing?: A Lot ?6 Click Score: 14 ? ?  ?End of Session Equipment Utilized During Treatment: Rolling walker (2 wheels);Oxygen ? ?OT Visit Diagnosis: Unsteadiness on feet (R26.81);Muscle weakness (generalized) (M62.81);Other symptoms and signs involving cognitive function;Cognitive communication deficit (R41.841) ?Symptoms and signs involving cognitive functions: Cerebral infarction ?  ?Activity Tolerance Patient tolerated treatment well ?  ?Patient Left in bed;with call bell/phone within reach;with family/visitor present;with chair alarm set ?  ?Nurse Communication Mobility status ?  ? ?   ? ?Time: 3825-0539 ?OT Time Calculation (min): 55 min ? ?Charges: OT  General Charges ?$OT Visit: 1 Visit ?OT Treatments ?$Self Care/Home Management : 53-67 mins ? ? ?Saja Bartolini OTR/L ?05/01/2022, 9:57 AM ?

## 2022-05-01 NOTE — TOC Progression Note (Signed)
Transition of Care (TOC) - Progression Note  ? ? ?Patient Details  ?Name: EMERSYN KOTARSKI ?MRN: [id number] ?Date of Birth: 05/10/1941 ? ?Transition of Care (TOC) CM/SW Contact  ?Milas Gain, LCSWA ?Phone Number: ?05/01/2022, 11:29 AM ? ?Clinical Narrative:    ? ?CSW following and will continue to assist with patients dc planning needs. Nicki with [employer] informed CSW to check back in with her on bed availability close to patient being medically ready. CSW will follow up with Nicki once anticipated dc day confirmed to see if she can offer SNF Bed for patient. CSW will continue to follow. ? ?Expected Discharge Plan: [place] ?Barriers to Discharge: Continued Medical Work up ? ?Expected Discharge Plan and Services ?Expected Discharge Plan: [place] ?In-house Referral: Clinical Social Work ?  ?  ?Living arrangements for the past 2 months: [place] ?                ?  ?  ?  ?  ?  ?  ?  ?  ?  ?  ? ? ?Social Determinants of Health (SDOH) Interventions ?  ? ?Readmission Risk Interventions ?   ? View : No data to display.  ?  ?  ?  ? ? ?

## 2022-05-01 NOTE — Progress Notes (Signed)
?   ? ? ? ? ?[facility] ? ?Date of Admission:  04/25/2022    ? ?Abx: ?5/17-c linezolid ?5/11-c ceftriaxone ?  ? ?5/14-16 vanc ?5/14-16 amp ?5/11-14 azith                                                    ?  ?  ?Assessment: ?81 yo male copd on home 2 l o2, dysphagia, HFrEF, dm2, cad s/p cabg, djd with cervical neck pain, admitted 5/11 initially with chest pain/dyspnea and acute on chronic hypoxic resp failure initially treated for CAP, but subsequently found to have ventriculitis/meningitis, currently complicated by also dysphagia/hypophonia, aki, and demand ischemia ?  ?  ?  ?Initial chest CTA no PE or opacity ?He had ct had and subsequent mri (ordered for complaint of neck pain) that showed periventricular white matter 43m hyperdense focus and layering within the occipital horns of lateral ventricles suggesting SAH vs meningitis.  ?  ?Neurology evaluated and performed LP 3 days after he has been on CAP abx treatment. 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  ?5/14 LP -- hazy; wbc 1300; rbc 90; neutrophilic 853% protein >>614 glucose<20. I do not see reported xanthochromia and his presentation is not consistent with SAH ?  ?Imaging does suggest ventriculitis as well with layering density in the ventricles ?He was started on meningitis empiric tx with the LP result and imaging/presentation ?  ?Of note, he has strep anginosus in 1 of 4 bottles this admission. Will see if the csf cx grows anything. Strep angi causes abscess commonly and the mri presentation is rather atypical. I am not sure what to make of it at this time -- he is on appropriate coverage abx for it as well ?  ?Of note, his abd/pelv ct portion showed presacral mass like density and adrenal mass. Primary team awared and ponder work up ? ?Ent following along for hypophonia which they suspect could be related to cva ? ? ?----------- ?5/17 assessment ?5/16 abd pelv ct unremarkable ?Clinically improving  much more alert ?Hypophonia remains ? ? ?Aki improving today ?Csf culture ngtd; csf pcr in progress ? ?  ?Plan: ?Continue ceftriaxone 2g q12hr; add linezolid ?F/u micro data ?Discussed with primary team ? ? ?I spent more than 35 minute reviewing data/chart, and coordinating care and >50% direct face to face time providing counseling/discussing diagnostics/treatment plan with patient ? ?Principal Problem: ?  Acute on chronic respiratory failure with hypoxia ([facility] ?Active Problems: ?  COPD with acute exacerbation ([facility] ?  Diabetes mellitus type II, non insulin dependent ([facility] [place] ?  Acute on chronic systolic (congestive) heart failure ([facility] ?  Presacral mass ?  Adrenal mass ([place] ?  Neck mass ?  Tremor ?  Ventriculitis of brain due to bacteria ?  Meningitis ?  Severe sepsis ([facility] ?  Dysphonia ?  Paresis of right vocal cord ? ? ?No Known Allergies ? ?Scheduled Meds: ? Chlorhexidine Gluconate Cloth  6 each Topical Daily  ? dorzolamide  1 drop Both Eyes BID  ? heparin injection (subcutaneous)  5,000 Units Subcutaneous Q8H  ? insulin aspart  0-15 Units Subcutaneous TID WC  ? insulin aspart  0-5 Units Subcutaneous QHS  ? metoprolol tartrate  5 mg Intravenous Q6H  ? mometasone-formoterol  2 puff Inhalation BID  ?  sodium chloride flush  3 mL Intravenous Q12H  ? umeclidinium bromide  1 puff Inhalation Daily  ? ?Continuous Infusions: ? sodium chloride 500 mL (04/26/22 0006)  ? cefTRIAXone (ROCEPHIN)  IV 2 g (05/01/22 0848)  ? dextrose 1,000 mL (04/30/22 1829)  ? linezolid (ZYVOX) IV 600 mg (05/01/22 1150)  ? potassium chloride    ? ?PRN Meds:.sodium chloride, acetaminophen, HYDROmorphone (DILAUDID) injection, levalbuterol, LORazepam, ondansetron (ZOFRAN) IV, sodium chloride flush ? ? ?SUBJECTIVE: ?Mentation continues to improve ?Afebrile ?No n/v/diarrhea ?No headache ?No cough ?No rash ? ?Aki improving ? ? ? ?Review of Systems: ?ROS ?All other ROS was negative, except mentioned above ? ? ? ? ?OBJECTIVE: ?Vitals:  ? 05/01/22 0414  05/01/22 0726 05/01/22 0745 05/01/22 1130  ?BP: 127/72 138/68  124/81  ?Pulse: 87 99  100  ?Resp: 19 (!) 22  17  ?Temp: 98.3 ?F (36.8 ?C) 98.9 ?F (37.2 ?C)  98.6 ?F (37 ?C)  ?TempSrc: Axillary Axillary  Axillary  ?SpO2: 92% 91% 94% 100%  ?Weight: 103.7 kg     ?Height:      ? ?Body mass index is 36.9 kg/m?. ? ?Physical Exam ? ? ?General/constitutional: no distress, pleasant, sitting in chair; interactive; appropriate; unable to phonate ?HEENT: Normocephalic, PER, Conj Clear, EOMI, Oropharynx clear ?Neck supple ?CV: rrr no mrg ?Lungs: clear to auscultation, normal respiratory effort ?Abd: Soft, Nontender ?Ext: no edema ?Skin: No Rash ?Neuro: nonfocal ?MSK: no peripheral joint swelling/tenderness/warmth; back spines nontender ? ? ? ? ?Lab Results ?Lab Results  ?Component Value Date  ? WBC 17.5 (H) 05/01/2022  ? HGB 12.7 (L) 05/01/2022  ? HCT 40.1 05/01/2022  ? MCV 93.9 05/01/2022  ? PLT 346 05/01/2022  ?  ?Lab Results  ?Component Value Date  ? CREATININE 1.60 (H) 05/01/2022  ? BUN 57 (H) 05/01/2022  ? NA 150 (H) 05/01/2022  ? K 3.3 (L) 05/01/2022  ? CL 114 (H) 05/01/2022  ? CO2 28 05/01/2022  ?  ?Lab Results  ?Component Value Date  ? ALT 11 04/29/2022  ? AST 13 (L) 04/29/2022  ? ALKPHOS 96 04/29/2022  ? BILITOT 0.7 04/29/2022  ?  ? ? ?Microbiology: ?Recent Results (from the past 240 hour(s))  ?Resp Panel by RT-PCR (Flu A&B, Covid) Nasopharyngeal Swab     Status: None  ? Collection Time: 04/25/22 11:04 AM  ? Specimen: Nasopharyngeal Swab; Nasopharyngeal(NP) swabs in vial transport medium  ?Result Value Ref Range Status  ? SARS Coronavirus 2 by RT PCR NEGATIVE NEGATIVE Final  ?  Comment: (NOTE) ?SARS-CoV-2 target nucleic acids are NOT DETECTED. ? ?The SARS-CoV-2 RNA is generally detectable in upper respiratory ?specimens during the acute phase of infection. The lowest ?concentration of SARS-CoV-2 viral copies this assay can detect is ?138 copies/mL. A negative result does not preclude SARS-Cov-2 ?infection and should not  be used as the sole basis for treatment or ?other patient management decisions. A negative result may occur with  ?improper specimen collection/handling, submission of specimen other ?than nasopharyngeal swab, presence of viral mutation(s) within the ?areas targeted by this assay, and inadequate number of viral ?copies(<138 copies/mL). A negative result must be combined with ?clinical observations, patient history, and epidemiological ?information. The expected result is Negative. ? ?Fact Sheet for Patients:  ?EntrepreneurPulse.com.au ? ?Fact Sheet for Healthcare Providers:  ?IncredibleEmployment.be ? ?This test is no t yet approved or cleared by the [place] FDA and  ?has been authorized for detection and/or diagnosis of SARS-CoV-2 by ?FDA under an Emergency Use Authorization (EUA).  This EUA will remain  ?in effect (meaning this test can be used) for the duration of the ?COVID-19 declaration under Section 564(b)(1) of the Act, 21 ?U.S.C.section 360bbb-3(b)(1), unless the authorization is terminated  ?or revoked sooner.  ? ? ?  ? Influenza A by PCR NEGATIVE NEGATIVE Final  ? Influenza B by PCR NEGATIVE NEGATIVE Final  ?  Comment: (NOTE) ?The Xpert Xpress SARS-CoV-2/FLU/RSV plus assay is intended as an aid ?in the diagnosis of influenza from Nasopharyngeal swab specimens and ?should not be used as a sole basis for treatment. Nasal washings and ?aspirates are unacceptable for Xpert Xpress SARS-CoV-2/FLU/RSV ?testing. ? ?Fact Sheet for Patients: ?EntrepreneurPulse.com.au ? ?Fact Sheet for Healthcare Providers: ?IncredibleEmployment.be ? ?This test is not yet approved or cleared by the [place] FDA and ?has been authorized for detection and/or diagnosis of SARS-CoV-2 by ?FDA under an Emergency Use Authorization (EUA). This EUA will remain ?in effect (meaning this test can be used) for the duration of the ?COVID-19 declaration under Section  564(b)(1) of the Act, 21 U.S.C. ?section 360bbb-3(b)(1), unless the authorization is terminated or ?revoked. ? ?Performed at [facility], [address], [place] [address] ?  ?Blood

## 2022-05-02 DIAGNOSIS — J9621 Acute and chronic respiratory failure with hypoxia: Secondary | ICD-10-CM | POA: Diagnosis not present

## 2022-05-02 DIAGNOSIS — G039 Meningitis, unspecified: Secondary | ICD-10-CM | POA: Diagnosis not present

## 2022-05-02 LAB — CBC
HCT: 50.2 % (ref 39.0–52.0)
Hemoglobin: 15.3 g/dL (ref 13.0–17.0)
MCH: 28.9 pg (ref 26.0–34.0)
MCHC: 30.5 g/dL (ref 30.0–36.0)
MCV: 94.7 fL (ref 80.0–100.0)
Platelets: 221 10*3/uL (ref 150–400)
RBC: 5.3 MIL/uL (ref 4.22–5.81)
RDW: 13.4 % (ref 11.5–15.5)
WBC: 14.1 10*3/uL — ABNORMAL HIGH (ref 4.0–10.5)
nRBC: 0 % (ref 0.0–0.2)

## 2022-05-02 LAB — BASIC METABOLIC PANEL
Anion gap: 12 (ref 5–15)
BUN: 41 mg/dL — ABNORMAL HIGH (ref 8–23)
CO2: 25 mmol/L (ref 22–32)
Calcium: 8.2 mg/dL — ABNORMAL LOW (ref 8.9–10.3)
Chloride: 108 mmol/L (ref 98–111)
Creatinine, Ser: 1.3 mg/dL — ABNORMAL HIGH (ref 0.61–1.24)
GFR, Estimated: 56 mL/min — ABNORMAL LOW (ref >60)
Glucose, Bld: 175 mg/dL — ABNORMAL HIGH (ref 70–99)
Potassium: 4.1 mmol/L (ref 3.5–5.1)
Sodium: 145 mmol/L (ref 135–145)

## 2022-05-02 LAB — PROTEIN ELECTROPHORESIS, SERUM
A/G Ratio: 0.8 (ref 0.7–1.7)
Albumin ELP: 2.3 g/dL — ABNORMAL LOW (ref 2.9–4.4)
Alpha-1-Globulin: 0.3 g/dL (ref 0.0–0.4)
Alpha-2-Globulin: 1.1 g/dL — ABNORMAL HIGH (ref 0.4–1.0)
Beta Globulin: 0.7 g/dL (ref 0.7–1.3)
Gamma Globulin: 0.7 g/dL (ref 0.4–1.8)
Globulin, Total: 2.8 g/dL (ref 2.2–3.9)
Total Protein ELP: 5.1 g/dL — ABNORMAL LOW (ref 6.0–8.5)

## 2022-05-02 LAB — GLUCOSE, CAPILLARY
Glucose-Capillary: 162 mg/dL — ABNORMAL HIGH (ref 70–99)
Glucose-Capillary: 177 mg/dL — ABNORMAL HIGH (ref 70–99)
Glucose-Capillary: 143 mg/dL — ABNORMAL HIGH (ref 70–99)
Glucose-Capillary: 154 mg/dL — ABNORMAL HIGH (ref 70–99)
Glucose-Capillary: 121 mg/dL — ABNORMAL HIGH (ref 70–99)

## 2022-05-02 LAB — KAPPA/LAMBDA LIGHT CHAINS
Kappa free light chain: 84.8 mg/L — ABNORMAL HIGH (ref 3.3–19.4)
Kappa, lambda light chain ratio: 1.32 (ref 0.26–1.65)
Lambda free light chains: 64.1 mg/L — ABNORMAL HIGH (ref 5.7–26.3)

## 2022-05-02 MED ORDER — CEFAZOLIN SODIUM-DEXTROSE 2-4 GM/100ML-% IV SOLN: 2.0000 g | INTRAVENOUS | Status: AC

## 2022-05-02 NOTE — Progress Notes (Signed)
Progress Note  Patient: [name] MWN:[id number] DOB: [birth year]  DOA: 04/25/2022  DOS: 05/02/2022    Brief hospital course: [name] is a 81 y.o. 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 He was admitted for acute on chronic hypoxic respiratory failure with suspicion of acute CHF and/or pneumonia with subsequent CTA chest showing no PE. He was afebrile with leukocytosis and low back pain in addition to his chronic neck pain. Imaging revealed a presacral density, adrenal mass, upper normal sized mediastinal lymph nodes. CT head showed nonspecific 77m hyperdense focus if periventricular parietal white matter for which subsequent MRI was ordered. This was severely motion degraded and limited, but revealed abnormal diffusion signal abnormality layering within the occipital horns of lateral ventricles and Sylvian fissure suggestive of SAH vs. meningitis. Neurology was consulted, performed LP which is consistent with bacterial meningitis. 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  Assessment and Plan:  Acute on chronic hypoxic respiratory failure:  Presumably due primarily to aspiration pneumonia, CTA chest without PE.  There is evidence of atelectasis, pneumonia, and interstitial edema.  Continue to supplement oxygen as needed to maintain normal respiratory effort and SpO2 >89%.  Patient is confirmed to be DNR/DNI. Continue baseline COPD medications and prn albuterol. No wheezing.  Encourage frequent incentive spirometry.  Bacterial meningitis: Concurrent metabolic encephalopathy/rule out hospital delirium Negative gram stain, though Neutrophils severely elevated with low glucose, high protein on LP 5/14.  -  ID consulted - currently on ceftriaxone/linezolid - Continue droplet precautions.  - Household contacts over the past 7 days have been given chemoprophylaxis on 5/14. No further needs in this regard per multiple physicians. - Plan to check MRI brain/C spine to evaluate for collections and further define infarct pending improvement in renal function to allow use of contrast.   Acute renal failure, resolving:  Secondary to multiple insults including lasix, NPO, repeated contrast dye loads, vancomycin.  Continue D5W @ 75 cc/hr and monitor in AM.  Will need to discuss NGT at least for free water in the short term given ongoing aspiration risk  Acute urinary retention:  Continue Foley catheter, urine output improving Dark maroon/bloody urine today - continue to follow Consult Urology per discussion with nephrology - may require CBI/further evaluation  Severe lumbago -Likely in the setting of meningitis -Appears to improving over the past 24 hours -Continue tylenol, robaxin, heating pad -Dilaudid for breakthrough pain only -he has used sparingly over the past 24 hours.  Acute on chronic HFREF:  LVEF 45-50%, based on more recent echo, LVEF has improved to 50-55%.  Holding lasix - cardiology consulted.  - Continue metoprolol, giving IV scheduled while NPO.  Acute CVA:  Suggested by [id number] signal abnormality involving the posterior left frontotemporal region on limited MRI.  - Ok to start ASA, though made strict NPO due to ongoing silent aspiration.  - Repeat MRI in coming days once renal function improves.  CAD s/p CABG:  Troponin initially negative at 14, subsequent evidence of demand ischemia (up to 5630due to sepsis. ACS is not currently suspected.  - Continue aspirin, metoprolol, statin - Cardiology consulted, no changes to management or ischemic work up currently planned.  Multifocal pneumonia:  - Increasing consolidations  on CTA coinciding with respiratory deterioration. -  Started abx and will continue as above.  - Monitor blood cultures drawn at admission (unchanged 1 of 4 S. anginosis and other is NGTD x5 days)  Presacral mass:  Indeterminate, incidental. Not rim-enhancing. 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 - Renin and aldosterone pending - Cortisol is 49.2. Metanephrines negative. - Needs PET to delineate this as well as presacral density on CT.   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 Not consistent with seizure. improving  NIDT2DM: HbA1c 6.5%, controlled.  - Continue moderate SSI, hold metformin with contrast exposures.   Obesity: Estimated body mass index is 37.36 kg/m as calculated from the following:   Height as of this encounter: '5\' 6"'$  (1.676 m).   Weight as of this encounter: 105 kg.  Subjective: Awake alert this morning denies nausea vomiting diarrhea constipation headache fevers chills or chest pain.  Speech is still somewhat limited and quiet but patient able to pronounce and enunciate moderately well today.  Objective: Vitals:   05/01/22 2013 05/01/22 2020 05/02/22 0443 05/02/22 0504  BP: (!) 151/72   (!) 141/67  Pulse: 97   81  Resp: (!) 23   (!) 23  Temp: 98 F (36.7 C)   98.3 F (36.8 C)  TempSrc: Oral   Axillary  SpO2: 93% 95%  94%  Weight:   105 kg   Height:       General:  Pleasantly resting in bed, No acute distress. HEENT: Speech quiet. Neck:  Without mass or deformity.  Trachea is midline. Lungs: Scant bibasilar rales. Heart:  Regular rate and rhythm.  Without murmurs, rubs, or gallops. Abdomen:  Soft, nontender, nondistended.  Without guarding or rebound. Extremities: Without cyanosis, clubbing, edema, or obvious deformity. Vascular:  Dorsalis pedis and posterior tibial pulses palpable bilaterally. Skin:  Warm and dry, no  erythema, no ulcerations.  Data Personally reviewed: CBC: Recent Labs  Lab 04/27/22 0412 04/28/22 0322 04/29/22 1418 05/01/22 0344 05/02/22 0444  WBC 16.8* 18.6* 20.6* 17.5* 14.1*  NEUTROABS 13.8*  --  16.1*  --   --   HGB 13.4 15.1 14.8 12.7* 15.3  HCT 41.9 47.6 46.7 40.1 50.2  MCV 91.3 91.4 92.5 93.9 94.7  PLT 364 412* 429* 346 793    Basic Metabolic Panel: Recent Labs  Lab 04/26/22 0245 04/27/22 0412 04/28/22 0322 04/29/22 0212 04/29/22 1418 04/30/22 0603 04/30/22 1616 05/01/22 0344 05/02/22 0444  NA 135 139 141   < > 153* 149* 152* 150* 145  K 3.6 3.7 3.7   < > 3.5 3.7 3.6 3.3* 4.1  CL 94* 100 99   < > 105 107 112* 114* 108  CO2 '29 30 30   '$ < > '29 27 29 28 25  '$ GLUCOSE 143* 218* 213*   < > 136* 163* 123* 182* 175*  BUN 17 24* 27*   < > 58* 73* 74* 57* 41*  CREATININE 0.91 0.74 0.92   < > 2.35* 3.28* 2.86* 1.60* 1.30*  CALCIUM 8.8* 9.5 9.7   < > 9.0 8.6* 8.3* 8.4* 8.2*  MG 2.1 2.2 2.2  --   --   --   --   --   --    < > = values in this interval not displayed.  GFR: Estimated Creatinine Clearance: 53.2 mL/min (A) (by C-G formula based on SCr of 1.3 mg/dL (H)).  HbA1C: No results for input(s): HGBA1C in the last 72 hours.  CBG: Recent Labs  Lab 05/01/22 1127 05/01/22 1740 05/01/22 2036 05/01/22 2352 05/02/22 0452  GLUCAP 164* 156* 141* 193* 162*    Urine analysis:    Component Value Date/Time   COLORURINE AMBER (A) 04/30/2022 0646   APPEARANCEUR CLEAR 04/30/2022 0646   LABSPEC 1.025 04/30/2022 0646   PHURINE 5.0 04/30/2022 0646   GLUCOSEU NEGATIVE 04/30/2022 0646   HGBUR MODERATE (A) 04/30/2022 0646   BILIRUBINUR NEGATIVE 04/30/2022 0646   [place] 04/30/2022 0646   PROTEINUR NEGATIVE 04/30/2022 0646   NITRITE NEGATIVE 04/30/2022 0646   LEUKOCYTESUR NEGATIVE 04/30/2022 0646   Recent Results (from the past 240 hour(s))  Resp Panel by RT-PCR (Flu A&B, Covid) Nasopharyngeal Swab     Status: None   Collection Time: 04/25/22 11:04 AM    Specimen: Nasopharyngeal Swab; Nasopharyngeal(NP) swabs in vial transport medium  Result Value Ref Range Status   SARS Coronavirus 2 by RT PCR NEGATIVE NEGATIVE Final    Comment: (NOTE) SARS-CoV-2 target nucleic acids are NOT DETECTED.  The SARS-CoV-2 RNA is generally detectable in upper respiratory specimens during the acute phase of infection. The lowest concentration of SARS-CoV-2 viral copies this assay can detect is 138 copies/mL. A negative result does not preclude SARS-Cov-2 infection and should not be used as the sole basis for treatment or other patient management decisions. A negative result may occur with  improper specimen collection/handling, submission of specimen other than nasopharyngeal swab, presence of viral mutation(s) within the areas targeted by this assay, and inadequate number of viral copies(<138 copies/mL). A negative result must be combined with clinical observations, patient history, and epidemiological information. The expected result is Negative.  Fact Sheet for Patients:  EntrepreneurPulse.com.au  Fact Sheet for Healthcare Providers:  IncredibleEmployment.be  This test is no t yet approved or cleared by the [place] FDA and  has been authorized for detection and/or diagnosis of SARS-CoV-2 by FDA under an Emergency Use Authorization (EUA). This EUA will remain  in effect (meaning this test can be used) for the duration of the COVID-19 declaration under Section 564(b)(1) of the Act, 21 U.S.C.section 360bbb-3(b)(1), unless the authorization is terminated  or revoked sooner.       Influenza A by PCR NEGATIVE NEGATIVE Final   Influenza B by PCR NEGATIVE NEGATIVE Final    Comment: (NOTE) The Xpert Xpress SARS-CoV-2/FLU/RSV plus assay is intended as an aid in the diagnosis of influenza from Nasopharyngeal swab specimens and should not be used as a sole basis for treatment. Nasal washings and aspirates are  unacceptable for Xpert Xpress SARS-CoV-2/FLU/RSV testing.  Fact Sheet for Patients: EntrepreneurPulse.com.au  Fact Sheet for Healthcare Providers: IncredibleEmployment.be  This test is not yet approved or cleared by the [place] FDA and has been authorized for detection and/or diagnosis of SARS-CoV-2 by FDA under an Emergency Use Authorization (EUA). This EUA will remain in effect (meaning this test can be used) for the duration of the COVID-19 declaration under Section 564(b)(1) of the Act, 21 U.S.C. section 360bbb-3(b)(1), unless the authorization is terminated or revoked.  Performed at [facility], [address] [place], [place] [address]   Blood culture (routine x 2)     Status: Abnormal   Collection Time: 04/25/22 12:15 PM   Specimen: Right Antecubital; Blood  Result Value Ref Range Status  Specimen Description   Final    RIGHT ANTECUBITAL Performed at [facility], [address] [place], [place] [address]    Special Requests   Final    BOTTLES DRAWN AEROBIC AND ANAEROBIC Blood Culture adequate volume Performed at [facility], [address] [place], [place] [address]    Culture  Setup Time   Final    GRAM POSITIVE COCCI IN CHAINS ANAEROBIC BOTTLE ONLY CRITICAL RESULT CALLED TO, READ BACK BY AND VERIFIED WITH: PHARMD A. MEYER [id number] '@2111'$  FH    Culture (A)  Final    STREPTOCOCCUS ANGINOSIS THE SIGNIFICANCE OF ISOLATING THIS ORGANISM FROM A SINGLE SET OF BLOOD CULTURES WHEN MULTIPLE SETS ARE DRAWN IS UNCERTAIN. PLEASE NOTIFY THE MICROBIOLOGY DEPARTMENT WITHIN ONE WEEK IF SPECIATION AND SENSITIVITIES ARE REQUIRED. Performed at [facility], [place] [address], [place], [place] [address]    Report Status 04/28/2022 FINAL  Final  Blood Culture ID Panel (Reflexed)     Status: Abnormal   Collection Time: 04/25/22 12:15 PM  Result Value Ref Range Status   Enterococcus faecalis NOT DETECTED  NOT DETECTED Final   Enterococcus Faecium NOT DETECTED NOT DETECTED Final   Listeria monocytogenes NOT DETECTED NOT DETECTED Final   Staphylococcus species NOT DETECTED NOT DETECTED Final   Staphylococcus aureus (BCID) NOT DETECTED NOT DETECTED Final   Staphylococcus epidermidis NOT DETECTED NOT DETECTED Final   Staphylococcus lugdunensis NOT DETECTED NOT DETECTED Final   Streptococcus species DETECTED (A) NOT DETECTED Final    Comment: Not Enterococcus species, Streptococcus agalactiae, Streptococcus pyogenes, or Streptococcus pneumoniae. CRITICAL RESULT CALLED TO, READ BACK BY AND VERIFIED WITH: PHARMD A. MEYER [id number] '@2111'$  FH    Streptococcus agalactiae NOT DETECTED NOT DETECTED Final   Streptococcus pneumoniae NOT DETECTED NOT DETECTED Final   Streptococcus pyogenes NOT DETECTED NOT DETECTED Final   A.calcoaceticus-baumannii NOT DETECTED NOT DETECTED Final   Bacteroides fragilis NOT DETECTED NOT DETECTED Final   Enterobacterales NOT DETECTED NOT DETECTED Final   Enterobacter cloacae complex NOT DETECTED NOT DETECTED Final   Escherichia coli NOT DETECTED NOT DETECTED Final   Klebsiella aerogenes NOT DETECTED NOT DETECTED Final   Klebsiella oxytoca NOT DETECTED NOT DETECTED Final   Klebsiella pneumoniae NOT DETECTED NOT DETECTED Final   Proteus species NOT DETECTED NOT DETECTED Final   Salmonella species NOT DETECTED NOT DETECTED Final   Serratia marcescens NOT DETECTED NOT DETECTED Final   Haemophilus influenzae NOT DETECTED NOT DETECTED Final   Neisseria meningitidis NOT DETECTED NOT DETECTED Final   Pseudomonas aeruginosa NOT DETECTED NOT DETECTED Final   Stenotrophomonas maltophilia NOT DETECTED NOT DETECTED Final   Candida albicans NOT DETECTED NOT DETECTED Final   Candida auris NOT DETECTED NOT DETECTED Final   Candida glabrata NOT DETECTED NOT DETECTED Final   Candida krusei NOT DETECTED NOT DETECTED Final   Candida parapsilosis NOT DETECTED NOT DETECTED Final   Candida  tropicalis NOT DETECTED NOT DETECTED Final   Cryptococcus neoformans/gattii NOT DETECTED NOT DETECTED Final    Comment: Performed at [facility], [address] [place], [place] [address]  Blood culture (routine x 2)     Status: None   Collection Time: 04/25/22  1:00 PM   Specimen: BLOOD LEFT FOREARM  Result Value Ref Range Status   Specimen Description   Final    BLOOD LEFT FOREARM Performed at [facility], [address] [place], [place] [address]    Special Requests  Final    BOTTLES DRAWN AEROBIC AND ANAEROBIC Blood Culture adequate volume Performed at [facility], [address] [place], [place] [address]    Culture   Final    NO GROWTH 5 DAYS Performed at [facility], [place] [address] [place], [place] [address]    Report Status 04/30/2022 FINAL  Final  Urine Culture     Status: None   Collection Time: 04/25/22  1:17 PM   Specimen: Urine, Clean Catch  Result Value Ref Range Status   Specimen Description   Final    URINE, CLEAN CATCH Performed at [facility], [address] [place], [place] [address]    Special Requests   Final    NONE Performed at [facility], [address] [place], [place] [address]    Culture   Final    NO GROWTH Performed at [facility], [place] [address], [place], [place] [address]    Report Status 04/26/2022 FINAL  Final  CSF culture w Gram Stain     Status: None   Collection Time: 04/28/22 12:06 PM   Specimen: CSF; Cerebrospinal Fluid  Result Value Ref Range Status   Specimen Description CSF  Final   Special Requests Normal  Final   Gram Stain   Final    WBC PRESENT,BOTH PMN AND MONONUCLEAR NO ORGANISMS SEEN CYTOSPIN SMEAR    Culture   Final    NO GROWTH 3 DAYS Performed at [facility], [place] [address] [place],  [address]    Report Status 05/01/2022 FINAL  Final     No results found.   Family Communication: Spouse and SIL at  bedside.  Disposition: Status is: Inpatient Remains inpatient appropriate because: Inpatient management of bacterial meningitis and respiratory failure Planned Discharge Destination: TBD  Holli Humbles DO  05/02/2022 7:36 AM Pager - secure chat

## 2022-05-02 NOTE — Consult Note (Addendum)
Chief Complaint: Patient was seen in consultation today for placement of gastrostomy tube at the request of Holli Humbles, MD  Supervising Physician: Mir, Sharen Heck  Patient Status: [facility] - In-pt  History of Present Illness: [name] is a 81 y.o. male admitted with acute on chronic hypoxic respiratory failure, bacterial meningitis, acute renal failure, acute urinary retention, lumbago, heart failure, CVA, multifocal PNA, CAD s/p CABG, pre-sacral mass, right adrenal mass, unspecified tremor, and DMII.  He had a swallow function study done that showed aspiration. He is unable to safely meet his nutritional needs.  IR has been consulted for placement of G-tube.  Past Medical History:  Diagnosis Date   Anxiety    CAD (coronary artery disease)     s/p CABG with LIMA to LAD, SVG to diag, SVG to OM, SVG to RCA cath 2002 Dr. Radford Pax   CAP (community acquired pneumonia) due to Pneumococcus ([place]) 03/03/2017   Cataract    Chronic combined systolic and diastolic CHF (congestive heart failure) ([place]) 04/28/2018   Chronic kidney disease    Claudication (HCC)    normal ABIs   COPD (chronic obstructive pulmonary disease) ([place])    Depression    Diabetes mellitus without complication ([place])    Dyslipidemia    Hypersomnia    Hypertension    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HCC)    EF 40% with apical AK at cath intolerant to ACE I and ARBS, EF 43% by nuclear stress test 08/2010   Kidney stones    Macular degeneration    Morbid obesity ([place]) 04/28/2018    Past Surgical History:  Procedure Laterality Date   CORONARY ARTERY BYPASS GRAFT     with LIMA to LAD, SVG to diag, SVG to OM, SVG to RCA cath 2002 Dr. Radford Pax   urologic procedure for fertility      Allergies: Patient has no known allergies.  Medications: Prior to Admission medications   Medication Sig Start Date End Date Taking? Authorizing Provider  acetaminophen (TYLENOL) 500 MG tablet Take 100 mg by mouth every 8 (eight) hours as  needed for mild pain or fever.   Yes [provider]  albuterol (PROVENTIL HFA;VENTOLIN HFA) 108 (90 Base) MCG/ACT inhaler Inhale 1-2 puffs into the lungs every 6 (six) hours as needed for wheezing or shortness of breath. Patient taking differently: Inhale 2 puffs into the lungs every 4 (four) hours as needed for wheezing or shortness of breath. 03/05/17  Yes Reyne Dumas, MD  amLODipine (NORVASC) 10 MG tablet Take 10 mg by mouth daily.   Yes [provider]  atorvastatin (LIPITOR) 40 MG tablet Take 1 tablet (40 mg total) by mouth daily. 01/22/22  Yes Turner, Eber Hong, MD  carbamide peroxide (DEBROX) 6.5 % OTIC solution Place 5 drops into both ears daily as needed (wax buildup).   Yes [provider]  dorzolamide (TRUSOPT) 2 % ophthalmic solution Place 1 drop into both eyes 2 (two) times daily.  01/24/20  Yes [provider]  furosemide (LASIX) 40 MG tablet Take 40-80 mg by mouth daily as needed for edema or fluid. 03/11/18  Yes [provider]  Multiple Vitamin (MULTIVITAMIN) capsule Take 1 capsule by mouth daily.   Yes [provider]  metoprolol succinate (TOPROL-XL) 25 MG 24 hr tablet TAKE 1 TABLET BY MOUTH ONCE DAILY Patient taking differently: Take 25 mg by mouth daily. 05/05/17   Nahser, Wonda Cheng, MD     Family History  Problem Relation Age of Onset  Hypertension Father    Breast cancer Father    CVA Father    Diabetes Mother    Hypertension Mother    CAD Sister    Hypertension Sister    Diabetes Sister    Heart disease Brother    Diabetes Brother    Breast cancer Sister    Hypertension Sister    Diabetes Sister    Heart disease Sister     Social History   Socioeconomic History   Marital status: Married    Spouse name: Not on file   Number of children: Not on file   Years of education: Not on file   Highest education level: Not on file  Occupational History   Not on file  Tobacco Use   Smoking status: Former    Types:  Cigarettes    Quit date: 10/25/2001    Years since quitting: 20.5   Smokeless tobacco: Never  Vaping Use   Vaping Use: Never used  Substance and Sexual Activity   Alcohol use: Yes    Comment: occasional beer and wine   Drug use: No   Sexual activity: Not on file  Other Topics Concern   Not on file  Social History Narrative   Not on file   Social Determinants of Health   Financial Resource Strain: Not on file  Food Insecurity: Not on file  Transportation Needs: Not on file  Physical Activity: Not on file  Stress: Not on file  Social Connections: Not on file    Review of Systems: unable to obtain, patient doesn't arouse during exam   Vital Signs: BP (!) 130/55   Pulse 100   Temp 97.6 F (36.4 C) (Oral)   Resp (!) 25   Ht _0  (1.676 m)   Wt 231 lb 7.7 oz (105 kg)   SpO2 97%   BMI 37.36 kg/m   Physical Exam Vitals reviewed.  Constitutional:      General: He is sleeping.     Appearance: He is ill-appearing.  HENT:     Head: Normocephalic and atraumatic.     Mouth/Throat:     Mouth: Mucous membranes are dry.     Pharynx: Oropharynx is clear.  Cardiovascular:     Pulses: Normal pulses.  Pulmonary:     Effort: Pulmonary effort is normal.     Comments: Coarse breath sounds bilaterally Chest:     Chest wall: No edema.  Abdominal:     General: Abdomen is flat.     Palpations: Abdomen is soft.  Skin:    General: Skin is warm and dry.    Imaging: CT ANGIO HEAD NECK W WO CM  Result Date: 04/28/2022 CLINICAL DATA:  Subarachnoid hemorrhage follow-up. EXAM: CT ANGIOGRAPHY HEAD AND NECK TECHNIQUE: Multidetector CT imaging of the head and neck was performed using the standard protocol during bolus administration of intravenous contrast. Multiplanar CT image reconstructions and MIPs were obtained to evaluate the vascular anatomy. Carotid stenosis measurements (when applicable) are obtained utilizing NASCET criteria, using the distal internal carotid diameter as the  denominator. RADIATION DOSE REDUCTION: This exam was performed according to the departmental dose-optimization program which includes automated exposure control, adjustment of the mA and/or kV according to patient size and/or use of iterative reconstruction technique. CONTRAST:  61m OMNIPAQUE IOHEXOL 350 MG/ML SOLN COMPARISON:  04/25/2022 FINDINGS: CTA NECK FINDINGS Aortic arch: Atheromatous plaque which is extensive. Two vessel branching. Right carotid system: Diffuse atheromatous plaque affecting the common carotid and proximal ICA. No flow  limiting stenosis, beading, or ulceration. Left carotid system: Diffuse atheromatous plaque especially affecting the common and proximal internal carotid arteries. No flow limiting stenosis or ulceration. Vertebral arteries: Proximal subclavian atherosclerosis on both sides. The left vertebral artery is dominant. Proximal left vertebral artery kink from tortuosity. Diminutive right vertebral artery which is not seen proximally but is flowing from muscular branches of the distal V2 segment. Skeleton: No acute osseous finding or aggressive bone lesion. Ventral spinal canal gas collection at C3-4 and to a smaller extent at C4-5, usually herniation containing vacuum phenomenon. 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 Upper chest: Emphysema. Pulmonary opacity better assessed on recent chest CT. Review of the MIP images confirms the above findings CTA HEAD FINDINGS Anterior circulation: Extensive atheromatous plaque along the carotid siphons. 50% narrowing at the right anterior genu. No branch occlusion, beading, or aneurysm. Posterior circulation: Strong left vertebral artery dominance with minimal contribution of the right vertebral artery to the basilar. The vertebral and basilar arteries are diffusely patent. No PCA branch occlusion, beading, or aneurysm Venous sinuses: Diffusely patent Anatomic variants: As above Review of the MIP images confirms the above findings  IMPRESSION: 1. No emergent finding. No aneurysm or vascular malformation as a cause of subarachnoid findings. 2. No flow seen in the non dominant proximal right vertebral artery with distal reconstitution. 3. Diffuse atherosclerosis with up to 50% narrowing of the right intracranial ICA. 4. Gas in the ventral canal at C3-4 without evidence of underlying acute osseous disease, usually gas containing herniation. 5. 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 Electronically Signed   By: Jorje Guild M.D.   On: 04/28/2022 08:51   CT Head W or Wo Contrast  Result Date: 04/25/2022 CLINICAL DATA:  Brain metastases suspected. EXAM: CT HEAD WITHOUT AND WITH CONTRAST TECHNIQUE: Contiguous axial images were obtained from the base of the skull through the vertex without and with intravenous contrast. RADIATION DOSE REDUCTION: This exam was performed according to the departmental dose-optimization program which includes automated exposure control, adjustment of the mA and/or kV according to patient size and/or use of iterative reconstruction technique. CONTRAST:  41mL OMNIPAQUE IOHEXOL 300 MG/ML  SOLN COMPARISON:  None Available. FINDINGS: Brain: There is residual contrast material from today's earlier CT of the chest, abdomen, and pelvis. A 2 mm hyperdense focus in the right parietal periventricular white matter on both the noncontrast and postcontrast head CT images is indeterminate for an enhancing lesion, focus of calcification, punctate hemorrhage, or vascular structure (series 2, image 18). No enhancing intracranial lesion is identified elsewhere. There is no evidence of an acute infarct, mass, midline shift, or extra-axial fluid collection. Patchy and confluent hypodensities in the cerebral white matter bilaterally are nonspecific but compatible with moderately severe chronic small vessel ischemic disease. The ventricles and sulci are within normal limits for age. Vascular: Calcified atherosclerosis at the skull base.  Grossly patent dural venous sinuses. Skull: No fracture or suspicious osseous lesion. Sinuses/Orbits: Tiny mucous retention cyst in the left maxillary sinus. Clear mastoid air cells. Bilateral cataract extraction. Other: None. IMPRESSION: 1. 2 mm hyperdense focus in the right parietal periventricular white matter, indeterminate for a tiny metastasis, focus of calcification, punctate hemorrhage, or vascular structure with assessment limited by residual contrast from today's earlier body CT. Head MRI without and with contrast is recommended for further evaluation. 2. 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 Electronically Signed   By: Logan Bores M.D.   On: 04/25/2022 16:07   CT Angio Chest Pulmonary Embolism (PE) W or WO  Contrast  Result Date: 04/27/2022 CLINICAL DATA:  Positive D-dimer. Clinical suspicion for pulmonary embolus. EXAM: CT ANGIOGRAPHY CHEST WITH CONTRAST TECHNIQUE: Multidetector CT imaging of the chest was performed using the standard protocol during bolus administration of intravenous contrast. Multiplanar CT image reconstructions and MIPs were obtained to evaluate the vascular anatomy. RADIATION DOSE REDUCTION: This exam was performed according to the departmental dose-optimization program which includes automated exposure control, adjustment of the mA and/or kV according to patient size and/or use of iterative reconstruction technique. CONTRAST:  1m OMNIPAQUE IOHEXOL 350 MG/ML SOLN COMPARISON:  04/25/2022 standard chest CT. FINDINGS: Cardiovascular: Heart is enlarged. No substantial pericardial effusion. Coronary artery calcification is evident. Status post CABG. Moderate atherosclerotic calcification is noted in the wall of the thoracic aorta. There is no filling defect within the opacified pulmonary arteries to suggest the presence of an acute pulmonary embolus. Mediastinum/Nodes: No mediastinal lymphadenopathy. There is no hilar lymphadenopathy. The esophagus has normal  imaging features. There is no axillary lymphadenopathy. Lungs/Pleura: Centrilobular emphsyema noted. Bilateral dependent lower lobe collapse/consolidation, left slightly more than right. Interlobular septal thickening noted in the upper lobes bilaterally no substantial pleural effusion. Upper Abdomen: Layering tiny calcified gallstones evident. Numerous tiny hypodensities in the liver parenchyma are too small to characterize, better evaluated on recent dedicated abdomen CT. Musculoskeletal: No worrisome lytic or sclerotic osseous abnormality. Review of the MIP images confirms the above findings. IMPRESSION: 1. No CT evidence for acute pulmonary embolus. 2. Bilateral dependent lower lobe collapse/consolidation, left slightly more than right. 3. Interlobular septal thickening in the upper lobes bilaterally, suggesting pulmonary edema. 4. Cholelithiasis. 5. Aortic Atherosclerosis (ICD10-I70.0) and Emphysema (ICD10-J43.9). Electronically Signed   By: EMisty StanleyM.D.   On: 04/27/2022 12:45   MR BRAIN WO CONTRAST  Result Date: 04/28/2022 CLINICAL DATA:  Initial evaluation for suspected brain metastases. EXAM: MRI HEAD WITHOUT CONTRAST TECHNIQUE: Multiplanar, multiecho pulse sequences of the brain and surrounding structures were obtained without intravenous contrast. COMPARISON:  Prior CT from 04/25/2022. FINDINGS: Brain: Examination severely limited as the patient was unable to tolerate the full length of the exam. Axial coronal DWI sequence and axial FLAIR sequence only were performed. Additionally, provided images are severely degraded by motion. Age-related cerebral atrophy. Patchy and confluent T2/FLAIR hyperintensity involving the periventricular and deep white matter, most likely related chronic microvascular ischemic disease, moderate to severe in nature. Punctate 4 mm focus of diffusion signal abnormality seen involving the posterior left frontotemporal region, possibly reflecting a small ischemic infarct  (series 2, image 29). No other convincing diffusion abnormality to suggest acute or subacute ischemia on this limited exam. Gray-white matter differentiation otherwise grossly maintained. Abnormal diffusion signal abnormality seen layering within the occipital horns of both lateral ventricles (series 2, image 24). Additional diffusion signal abnormality seen at the left sylvian fissure (series 2, image 26). Findings are nonspecific, but suspicious for possible subarachnoid hemorrhage. Possible proteinaceous material/debris would be the primary differential consideration. Ventricles are relatively stable in size without progressive hydrocephalus. No mass lesion, mass effect, or midline shift. No extra-axial fluid collection. Vascular: Major intracranial vascular flow voids are grossly maintained at the skull base on this limited exam. Skull and upper cervical spine: Bone marrow signal intensity grossly within normal limits. No appreciable scalp soft tissue abnormality. Sinuses/Orbits: Globes and orbital soft tissues grossly within normal limits. Paranasal sinuses are largely clear. No significant mastoid effusion. Other: None. IMPRESSION: 1. Technically limited exam due to patient's inability to tolerate the full length of the study and extensive motion  artifact. 2. Abnormal small volume diffusion signal abnormality layering within the occipital horns of both lateral ventricles as well as the left Sylvian fissure. Findings are nonspecific, but suspicious for possible small volume subarachnoid hemorrhage. Possible proteinaceous material/debris (as in the setting of CNS infection) would be the primary differential consideration. Correlation with laboratory values recommended. Additionally, further evaluation with LP and CSF analysis could be performed for further evaluation as warranted. 3. 4 mm focus of diffusion signal abnormality involving the posterior left frontotemporal region, suspicious for a small acute  ischemic infarct. 4. Age-related cerebral atrophy with moderate to severe chronic microvascular ischemic disease. Critical Value/emergent results were called by telephone at the time of interpretation on 04/28/2022 at 4:15 am to provider [facility] , who verbally acknowledged these results. Electronically Signed   By: Jeannine Boga M.D.   On: 04/28/2022 04:21   CT CHEST ABDOMEN PELVIS W CONTRAST  Result Date: 04/25/2022 CLINICAL DATA:  Abdominal pain, congestion, fever, confusion, vomiting. History CHF, obesity, COPD, diabetes mellitus, hypertension, former smoker EXAM: CT CHEST, ABDOMEN, AND PELVIS WITH CONTRAST TECHNIQUE: Multidetector CT imaging of the chest, abdomen and pelvis was performed following the standard protocol during bolus administration of intravenous contrast. RADIATION DOSE REDUCTION: This exam was performed according to the departmental dose-optimization program which includes automated exposure control, adjustment of the mA and/or kV according to patient size and/or use of iterative reconstruction technique. CONTRAST:  17m OMNIPAQUE IOHEXOL 300 MG/ML SOLN IV. No oral contrast. COMPARISON:  None FINDINGS: CT CHEST FINDINGS Cardiovascular: Atherosclerotic calcifications aorta, proximal great vessels, and coronary arteries. Post CABG. Aorta normal caliber. Enlargement of cardiac chambers. No pericardial effusion. Pulmonary arteries grossly patent on non targeted exam. Mediastinum/Nodes: Esophagus unremarkable. Base of cervical region normal appearance. Upper normal sized mediastinal lymph nodes. No thoracic adenopathy. Lungs/Pleura: Emphysematous changes. Peribronchial thickening. Minimal dependent atelectasis in lower lobes and base of lingula. No acute infiltrate, pleural effusion, or pneumothorax. Musculoskeletal: No acute osseous findings. CT ABDOMEN PELVIS FINDINGS Hepatobiliary: Tiny low-attenuation foci within liver up to 10 mm, a few likely representing small cysts, remainder  indeterminate. Dependent gallstones in gallbladder. No biliary dilatation. Pancreas: Normal appearance Spleen: Normal appearance.  Small splenule. Adrenals/Urinary Tract: LEFT adrenal gland normal. RIGHT adrenal mass 4.2 x 4.1 cm image 60, 39 HU on portal venous phase, 36 HU on delayed images. Two LEFT renal cysts, larger 3.1 x 2.8 cm; no follow-up imaging recommended. Kidneys and ureters otherwise normal appearance. Mild nonspecific stranding in perinephric fat planes. Dependent calculi within urinary bladder. Small anterior diverticulum at superior bladder. Stomach/Bowel: Extensive distal colonic diverticulosis without evidence of diverticulitis. Normal appendix. Small hiatal hernia. Stomach and bowel loops otherwise normal appearance. Vascular/Lymphatic: Extensive atherosclerotic calcifications aorta and iliac arteries. Significant atherosclerotic calcifications at the origins of the visceral arteries. Aorta normal caliber. 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 No adenopathy. Reproductive: Minimal prostatic enlargement. Seminal vesicles unremarkable Other: No free air or free fluid. Intermediate attenuation mass identified dorsal and RIGHT lateral to the rectum in the presacral space, 7.0 x 4.6 x 6.1 cm. No hernia. Musculoskeletal: Osseous structures demineralized. IMPRESSION: Intermediate attenuation mass presacral space 7.0 x 4.6 x 6.1 cm, nonspecific, uncertain etiology. Differential diagnosis would include complicated rectal duplication cyst, metastatic lesion, teratoma, lymphoma; in light of coexistent RIGHT adrenal mass, consider PET-CT for further evaluation to assess for potential primary tumor and to guide for most accessible site for biopsy. 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  Surgical consultation is recommended, though see above recommendation first. Consider biochemical lab evaluation for functional status and  pheochromocytoma prior to resection. JACR 2017 Aug; 14(8):1038-44, JCAT 2016 Mar-Apr; 40(2):194-200, Urol J 2006 Spring; 3(2):71-4. Dependent calculi within urinary bladder. Cholelithiasis. Extensive distal colonic diverticulosis without evidence of diverticulitis. Small hiatal hernia. Aortic Atherosclerosis (ICD10-I70.0) and Emphysema (ICD10-J43.9). Electronically Signed   By: Lavonia Dana M.D.   On: 04/25/2022 13:19   DG CHEST PORT 1 VIEW  Result Date: 04/29/2022 CLINICAL DATA:  Acute respiratory failure with hypoxia EXAM: PORTABLE CHEST 1 VIEW COMPARISON:  Chest radiograph 1 day prior FINDINGS: Median sternotomy wires are unchanged. The cardiomediastinal silhouette is stable, with unchanged calcified atherosclerotic plaque in the aortic arch. Increased interstitial markings are again seen throughout both lungs but worse on the left, overall unchanged. Retrocardiac opacity likely reflecting atelectasis is unchanged. There is no new or worsening focal airspace disease. There is no significant pleural effusion. There is no pneumothorax. There is no acute osseous abnormality. IMPRESSION: Overall no significant interval change in lung aeration since 04/28/2022 with findings again concerning for asymmetric pulmonary edema and left basilar atelectasis. Electronically Signed   By: Valetta Mole M.D.   On: 04/29/2022 08:23   DG CHEST PORT 1 VIEW  Result Date: 04/28/2022 CLINICAL DATA:  Respiratory distress.  Evaluate pulmonary edema. EXAM: PORTABLE CHEST 1 VIEW COMPARISON:  CT angio chest 04/27/2022 FINDINGS: Status post median sternotomy and CABG procedure. Normal heart size. Aortic atherosclerotic calcifications. Progressive, asymmetric opacification throughout the left lung is identified with increased peripheral septal lines concerning for asymmetric edema. Atelectasis and or airspace disease is noted within the left lung base. IMPRESSION: Progressive, asymmetric opacification throughout the left lung compatible  with asymmetric edema. Electronically Signed   By: Kerby Moors M.D.   On: 04/28/2022 05:28   DG Chest Port 1 View  Result Date: 04/25/2022 CLINICAL DATA:  Shortness of breath EXAM: PORTABLE CHEST 1 VIEW COMPARISON:  Previous studies including the examination of 02/03/2019 FINDINGS: Transverse diameter of heart is increased. There is evidence of previous coronary bypass surgery. Patient's chin is partly obscuring the apical regions of both lungs. Left hemidiaphragm is elevated. Central pulmonary vessels are prominent. There are linear densities in both lower lung fields. Left lateral CP angle is indistinct. There is no pneumothorax. IMPRESSION: Cardiomegaly. Central pulmonary vessels are prominent without signs of alveolar pulmonary edema. There are linear densities in both lower lung fields suggesting subsegmental atelectasis/pneumonia. Possible small left pleural effusion. Electronically Signed   By: Elmer Picker M.D.   On: 04/25/2022 11:59   DG Swallowing Func-Speech Pathology  Result Date: 04/29/2022 Table formatting from the original result was not included. Objective Swallowing Evaluation: Type of Study: MBS-Modified Barium Swallow Study  Patient Details Name: [name] MRN: [id number] Date of Birth: June 07, 1941 Today's Date: 04/29/2022 Time: SLP Start Time (ACUTE ONLY): 7035 -SLP Stop Time (ACUTE ONLY): 1610 SLP Time Calculation (min) (ACUTE ONLY): 29 min Past Medical History: Past Medical History: Diagnosis Date  Anxiety   CAD (coronary artery disease)    s/p CABG with LIMA to LAD, SVG to diag, SVG to OM, SVG to RCA cath 2002 Dr. Radford Pax  CAP (community acquired pneumonia) due to Pneumococcus ([place]) 03/03/2017  Cataract   Chronic combined systolic and diastolic CHF (congestive heart failure) ([place]) 04/28/2018  Chronic kidney disease   Claudication ([place])   normal ABIs  COPD (chronic obstructive pulmonary disease) ([place])   Depression   Diabetes mellitus without complication ([place])   Dyslipidemia  Hypersomnia   Hypertension   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HCC)   EF 40% with apical AK at cath intolerant to ACE I and ARBS, EF 43% by nuclear stress test 08/2010  Kidney stones   Macular degeneration   Morbid obesity ([place]) 04/28/2018 Past Surgical History: Past Surgical History: Procedure Laterality Date  CORONARY ARTERY BYPASS GRAFT    with LIMA to LAD, SVG to diag, SVG to OM, SVG to RCA cath 2002 Dr. Radford Pax  urologic procedure for fertility   HPI: 81 y.o. male adm 5/11 with edema and orthopnea with acute on chronic CHF and COPD. Pt also with acute onset dysphonia. 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 MRI 5/13 limited but with concern for CNS infection and possible small acute infarct in the L frontotemporal region. LP  5/14 + for bacterial meningitis. 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  Recommendations may be updated based on patient status, additional functional criteria and insurance authorization. 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 Orally, he has extra lingual manipulation and slow transit of boluses with premature spillage of thinner consistencies and increased lingual residue of thicker ones. Thin and nectar thick liquids that reach the pyriform sinuses before the swallow are aspirated during the swallow in the setting of incomplete airway closure. Honey thick liquids and purees are not as immediately aspirated, but there is more residue at the pyriform sinuses as well as residue within the cervical  esophagus/outpouching that backflow into the pyriform sinuses, with small amounts of aspiration that occurs after the swallow when imaging was turned off. Aspiration across consistencies was largely silent (PAS#8) with the exception of aspiration of a larger volume of thin liquids that elicited a throat clear. Pt could not cognitively carry out strategies during this study. Recommend that he remain NPO. In light of current respiratory status and intermittent need for BiPAP, would consider offering meds via alternate route as pharyngeal residue was aspirated during MBS. If MD agrees, could offer one or two ice chips after oral care to provide some moisture and allow for use of swallowing musculature. SLP will continue to follow. May also wish to consider ENT consult given report of acute onset aphonia and frequency of aspiration. SLP Visit Diagnosis Dysphagia, pharyngoesophageal phase (R13.14);Dysphagia, oropharyngeal phase (R13.12) Impact on safety and function Severe aspiration risk     04/29/2022   4:00 PM Treatment Recommendations Treatment Recommendations Therapy as outlined in treatment plan below     04/29/2022   4:00 PM Prognosis Prognosis for Safe Diet Advancement Good Barriers to Reach Goals Cognitive deficits   04/29/2022   4:00 PM Diet Recommendations SLP Diet Recommendations NPO Medication Administration Via alternative means     04/29/2022   4:00 PM Other Recommendations Recommended Consults Consider ENT evaluation Oral Care Recommendations Oral care QID Follow Up Recommendations -- Assistance recommended at discharge Frequent or constant Supervision/Assistance Functional Status Assessment Patient has had a recent decline in their functional status and demonstrates the ability to make significant improvements in function in a reasonable and predictable amount of time.  04/29/2022   4:00 PM Frequency and Duration  Speech Therapy Frequency (ACUTE ONLY) min 2x/week Treatment Duration 2 weeks     04/29/2022    4:00 PM Oral Phase Oral Phase Impaired Oral - Honey Cup Delayed oral transit;Decreased bolus cohesion;Lingual/palatal residue Oral - Nectar Cup Delayed oral transit;Decreased bolus cohesion;Premature spillage Oral - Nectar Straw Delayed oral transit;Decreased bolus cohesion;Premature spillage Oral - Thin Cup Decreased bolus cohesion;Premature spillage Oral - Thin Straw Decreased bolus cohesion;Premature spillage Oral - Puree Delayed oral transit;Decreased bolus cohesion    04/29/2022   4:00 PM Pharyngeal Phase Pharyngeal Phase Impaired Pharyngeal- Honey Cup Pharyngeal residue - pyriform;Penetration/Apiration after swallow Pharyngeal Material enters airway, passes BELOW cords without attempt by patient to eject out (silent aspiration) Pharyngeal- Nectar Cup Delayed swallow initiation-pyriform sinuses;Penetration/Aspiration during swallow;Pharyngeal residue - pyriform Pharyngeal Material enters airway, passes BELOW cords without attempt by patient to eject out (silent aspiration) Pharyngeal- Nectar Straw Delayed swallow initiation-pyriform sinuses;Reduced airway/laryngeal closure;Penetration/Aspiration during swallow;Pharyngeal residue - pyriform Pharyngeal Material enters airway, passes BELOW cords without attempt by patient to eject out (silent aspiration) Pharyngeal- Thin Cup Delayed swallow initiation-pyriform sinuses;Reduced airway/laryngeal closure;Penetration/Aspiration during swallow Pharyngeal Material enters airway, CONTACTS cords and not ejected out Pharyngeal- Thin Straw Delayed swallow initiation-pyriform sinuses;Reduced airway/laryngeal closure;Penetration/Aspiration during swallow;Pharyngeal residue - pyriform Pharyngeal Material enters airway, passes BELOW cords without attempt by patient to eject out (silent aspiration) Pharyngeal- Puree Pharyngeal residue - pyriform;Penetration/Apiration after swallow Pharyngeal Material enters airway, passes BELOW cords without attempt by patient to eject out (silent  aspiration)    04/29/2022   4:00 PM Cervical Esophageal Phase  Cervical Esophageal Phase Impaired Honey Cup Reduced cricopharyngeal relaxation;Esophageal backflow into cervical esophagus Nectar Cup Reduced cricopharyngeal relaxation Nectar Straw Reduced cricopharyngeal relaxation Thin Cup Reduced cricopharyngeal relaxation Thin Straw Reduced cricopharyngeal relaxation Puree Reduced cricopharyngeal relaxation;Esophageal backflow into cervical esophagus Osie Bond., M.A. CCC-SLP Acute Rehabilitation Services Office [phone] Secure chat preferred 04/29/2022, 5:50 PM                     VAS [place] LOWER EXTREMITY VENOUS (DVT)  Result Date: 04/28/2022  Lower Venous DVT Study Patient Name:  [name]  Date of Exam:   04/27/2022 Medical Rec #: [id number]      Accession #:    [id number] Date of Birth: 1941/05/10     Patient Gender: M Patient Age:   43 years Exam Location:  [facility] Procedure:      VAS [place] LOWER EXTREMITY VENOUS (DVT) Referring Phys: Vance Gather --------------------------------------------------------------------------------  Indications: Elevated D-dimer (2.96).  Limitations: Poor ultrasound/tissue interface and calcific shadowing, continous patient movement, poor positioning. Comparison Study: No previous exams Performing Technologist: Jody Hill RVT, RDMS  Examination Guidelines: A complete evaluation includes B-mode imaging, spectral Doppler, color Doppler, and power Doppler as needed of all accessible portions of each vessel. Bilateral testing is considered an integral part of a complete examination. Limited examinations for reoccurring indications may be performed as noted. The reflux portion of the exam is performed with the patient in reverse Trendelenburg.  +---------+---------------+---------+-----------+----------+-------------------+ RIGHT    CompressibilityPhasicitySpontaneityPropertiesThrombus Aging       +---------+---------------+---------+-----------+----------+-------------------+ CFV      Full           Yes      Yes                                      +---------+---------------+---------+-----------+----------+-------------------+ SFJ  Full                                                             +---------+---------------+---------+-----------+----------+-------------------+ FV Prox  Full           Yes      Yes                                      +---------+---------------+---------+-----------+----------+-------------------+ FV Mid   Full           Yes      Yes                                      +---------+---------------+---------+-----------+----------+-------------------+ FV DistalFull           Yes      Yes                                      +---------+---------------+---------+-----------+----------+-------------------+ PFV                                                   Not visualized      +---------+---------------+---------+-----------+----------+-------------------+ POP      Full           Yes      Yes                                      +---------+---------------+---------+-----------+----------+-------------------+ PTV                                                   Not well visualized +---------+---------------+---------+-----------+----------+-------------------+ PERO                                                  Not well visualized +---------+---------------+---------+-----------+----------+-------------------+   +---------+---------------+---------+-----------+----------+--------------+ LEFT     CompressibilityPhasicitySpontaneityPropertiesThrombus Aging +---------+---------------+---------+-----------+----------+--------------+ CFV      Full           Yes      Yes                                 +---------+---------------+---------+-----------+----------+--------------+ SFJ      Full                                                         +---------+---------------+---------+-----------+----------+--------------+  FV Prox  Full           Yes      Yes                                 +---------+---------------+---------+-----------+----------+--------------+ FV Mid   Full           Yes      Yes                                 +---------+---------------+---------+-----------+----------+--------------+ FV DistalFull           Yes      Yes                                 +---------+---------------+---------+-----------+----------+--------------+ PFV      Full                                                        +---------+---------------+---------+-----------+----------+--------------+ POP      Full           Yes      Yes                                 +---------+---------------+---------+-----------+----------+--------------+ PTV      Full                                                        +---------+---------------+---------+-----------+----------+--------------+ PERO     Full                                                        +---------+---------------+---------+-----------+----------+--------------+     Summary: BILATERAL: - No evidence of superficial venous thrombosis in the lower extremities, bilaterally. -No evidence of popliteal cyst, bilaterally. RIGHT: - There is no evidence of deep vein thrombosis in the lower extremity. However, portions of this examination were limited- see technologist comments above.  LEFT: - There is no evidence of deep vein thrombosis in the lower extremity.  *See table(s) above for measurements and observations. Electronically signed by Monica Martinez MD on 04/28/2022 at 12:38:09 PM.    Final     Labs:  CBC: Recent Labs    04/28/22 0322 04/29/22 1418 05/01/22 0344 05/02/22 0444  WBC 18.6* 20.6* 17.5* 14.1*  HGB 15.1 14.8 12.7* 15.3  HCT 47.6 46.7 40.1 50.2  PLT 412* 429* 346 221    COAGS: No  results for input(s): INR, APTT in the last 8760 hours.  BMP: Recent Labs    04/30/22 0603 04/30/22 1616 05/01/22 0344 05/02/22 0444  NA 149* 152* 150* 145  K 3.7 3.6 3.3* 4.1  CL 107 112* 114* 108  CO2 _0 GLUCOSE 163* 123* 182* 175*  BUN  73* 74* 57* 41*  CALCIUM 8.6* 8.3* 8.4* 8.2*  CREATININE 3.28* 2.86* 1.60* 1.30*  GFRNONAA 19* 22* 44* 56*    LIVER FUNCTION TESTS: Recent Labs    03/05/22 1025 04/28/22 0654 04/29/22 1418  BILITOT  --  0.7 0.7  AST  --  11* 13*  ALT _0 ALKPHOS  --  103 96  PROT  --  6.4* 6.5  ALBUMIN  --  2.4* 2.4*    TUMOR MARKERS: No results for input(s): AFPTM, CEA, CA199, CHROMGRNA in the last 8760 hours.  Assessment and Plan:  Nutritional deficiency  --secondary to illness and dysphagia with aspiration --anatomy amenable to percutaneous gastrostomy tube placement in IR --needs PO contrast the evening prior to tube placement --Wife and her sister at bedside and in agreement --will plan for early next week as Cortrak still needs to be placed --SQ heparinwill need to be held morning of the procedure --Ancef ordered for day of placement   Risks and benefits image guided gastrostomy tube placement was discussed with the spouse including, but not limited to the need for a barium enema during the procedure, bleeding, infection, peritonitis and/or damage to adjacent structures.  All of the patient's questions were answered, patient is agreeable to proceed.  Consent signed and in chart.   Thank you for this interesting consult.  I greatly enjoyed meeting NESTOR WIENEKE and look forward to participating in their care.  A copy of this report was sent to the requesting provider on this date.  Electronically Signed: Pasty Spillers, PA 05/02/2022, 3:02 PM   I spent a total of 40 Minutes in face to face in clinical consultation, greater than 50% of which was counseling/coordinating care for gastrostomy tube placement

## 2022-05-02 NOTE — Progress Notes (Signed)
Physical Therapy Treatment Patient Details Name: [name] MRN: [id number] DOB: 05-17-1941 Today's Date: 05/02/2022   History of Present Illness 81 y.o. male adm 5/11 with edema and orthopnea with acute on chronic CHF and COPD. 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 5/13 MRI with small SAH vs meningitis. 5/14 respiratory distress and LP with bacterial meningitis. 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 Pt tolerating 7L HFNC with sats >95% during session. Pt with improved orientation from last session but remains confused and lethargic. Pt with continued assist for all mobility with pt unsafe to get OOB to chair today due to cognition. Wife present throughout. If pt can demonstrate increased arousal and activity tolerance may be able to progress to D/C plan of AIR vs SNF. Will continue to follow.      Recommendations for follow up therapy are one component of a multi-disciplinary discharge planning process, led by the attending physician.  Recommendations may be updated based on patient status, additional functional criteria and insurance authorization.  Follow Up Recommendations  Skilled nursing-short term rehab (<3 hours/day) (may progress to AIR but needs to demonstrate increased tolerance)     Assistance Recommended at Discharge Frequent or constant Supervision/Assistance  Patient can return home with the following Assistance with cooking/housework;Assist for transportation;A lot of help with walking and/or transfers;Help with stairs or ramp for entrance;A lot of help with bathing/dressing/bathroom   Equipment Recommendations  Rolling walker (2 wheels);BSC/3in1    Recommendations for Other Services       Precautions / Restrictions Precautions Precautions:  Fall;Other (comment) Precaution Comments: watch HR and sats     Mobility  Bed Mobility Overal bed mobility: Needs Assistance Bed Mobility: Rolling, Sidelying to Sit Rolling: Mod assist Sidelying to sit: Mod assist   Sit to supine: Mod assist   General bed mobility comments: physical assist to roll to right and rise from surface with incresaed time and 3 attempts before able to complete rise. Return to bed with assist to lift legs    Transfers Overall transfer level: Needs assistance   Transfers: Sit to/from Stand Sit to Stand: Mod assist, From elevated surface           General transfer comment: mod assist with right knee blocked, use of belt and cues for hand placement from elevated surface after 3 attempts. In standing able to side step to right toward [facility] with min assist for weight shift and advancing feet    Ambulation/Gait               General Gait Details: not yet able   Stairs             Wheelchair Mobility    Modified Rankin (Stroke Patients Only)       Balance                                            Cognition Arousal/Alertness: Lethargic Behavior During Therapy: Flat affect Overall Cognitive Status: Impaired/Different from baseline Area of Impairment: Orientation, Attention                 Orientation Level: Time, Situation Current Attention Level:  Focused Memory: Decreased short-term memory Following Commands: Follows one step commands consistently, Follows one step commands with increased time Safety/Judgement: Decreased awareness of deficits, Decreased awareness of safety   Problem Solving: Slow processing, Requires verbal cues, Requires tactile cues General Comments: pt aware he is in [place] in the hospital but unsure why        Exercises General Exercises - Lower Extremity Long Arc Quad: Both, Seated, AROM, 5 reps    General Comments        Pertinent Vitals/Pain Pain Assessment Pain  Score: 4  Pain Location: pt unable to localize, grimace at times with movement Pain Descriptors / Indicators: Grimacing Pain Intervention(s): Limited activity within patient's tolerance, Monitored during session, Repositioned    Home Living                          Prior Function            PT Goals (current goals can now be found in the care plan section) Progress towards PT goals: Progressing toward goals    Frequency    Min 3X/week      PT Plan Current plan remains appropriate    Co-evaluation              AM-PAC PT "6 Clicks" Mobility   Outcome Measure  Help needed turning from your back to your side while in a flat bed without using bedrails?: A Lot Help needed moving from lying on your back to sitting on the side of a flat bed without using bedrails?: A Lot Help needed moving to and from a bed to a chair (including a wheelchair)?: A Lot Help needed standing up from a chair using your arms (e.g., wheelchair or bedside chair)?: A Lot Help needed to walk in hospital room?: Total Help needed climbing 3-5 steps with a railing? : Total 6 Click Score: 10    End of Session Equipment Utilized During Treatment: Gait belt Activity Tolerance: Patient tolerated treatment well Patient left: in bed;with call bell/phone within reach;with bed alarm set;with family/visitor present Nurse Communication: Mobility status PT Visit Diagnosis: Other abnormalities of gait and mobility (R26.89);Difficulty in walking, not elsewhere classified (R26.2);Muscle weakness (generalized) (M62.81);Pain     Time: 1120-1148 PT Time Calculation (min) (ACUTE ONLY): 28 min  Charges:  $Therapeutic Activity: 23-37 mins                     Maija P, PT Acute Rehabilitation Services Pager: [phone] Office: [phone]    Maija B Prater 05/02/2022, 12:03 PM  

## 2022-05-02 NOTE — Consult Note (Signed)
Urology Consult   Physician requesting consult: Holli Humbles, MD  Reason for consult: Gross hematuria  History of Present Illness: [name] is a 81 y.o. with a past medical history of COPD, heart failure, type 2 diabetes, CAD s/p CABG, admitted with acute on chronic hypoxic respiratory failure, bacterial meningitis, acute renal failure, acute CVA, multifocal pneumonia.  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  During his hospitalization, he developed urinary retention and a Foley catheter was placed.  He was found to be in renal failure and has had improving creatinine over the past couple of days.  His catheter was exchanged yesterday and his wife noted that he began to have gross hematuria.  This is possibly traumatic in nature.  He has had very mild hematuria which has been dark red earlier today and now has been pink.  There is been no passage of clots.  At baseline, his wife states that he has had a long history of BPH with LUTS with weak flow stream, sensation, bladder emptying and long voiding times.  He has never been appropriately managed for BPH and has not been on an alpha-blocker or finasteride.  He is not established with a urologist.  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  There is a small dependent stone in the bladder and a small anterior diverticulum in the superior portion of the bladder.  He had minimal prostatic enlargement.  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  He denies a prior history of gross hematuria.  He does have a smoking history however quit in 2002.  He denies a family history of urologic malignancy. Past Medical History:  Diagnosis Date   Anxiety    CAD (coronary artery disease)     s/p CABG with LIMA to LAD, SVG to diag, SVG to OM, SVG to RCA cath 2002 Dr. Radford Pax   CAP (community acquired pneumonia) due to  Pneumococcus ([place]) 03/03/2017   Cataract    Chronic combined systolic and diastolic CHF (congestive heart failure) ([place]) 04/28/2018   Chronic kidney disease    Claudication ([facility])    normal ABIs   COPD (chronic obstructive pulmonary disease) ([place])    Depression    Diabetes mellitus without complication ([place])    Dyslipidemia    Hypersomnia    Hypertension    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[place])    EF 40% with apical AK at cath intolerant to ACE I and ARBS, EF 43% by nuclear stress test 08/2010   Kidney stones    Macular degeneration    Morbid obesity ([place]) 04/28/2018    Past Surgical History:  Procedure Laterality Date   CORONARY ARTERY BYPASS GRAFT     with LIMA to LAD, SVG to diag, SVG to OM, SVG to RCA cath 2002 Dr. Radford Pax   urologic procedure for fertility      Medications:  Home meds:  Current Facility-Administered Medications on File Prior to Encounter  Medication Dose Route Frequency Provider Last Rate Last Admin   sodium chloride flush (NS) 0.9 % injection 10 mL  10 mL Intravenous PRN Sueanne Margarita, MD   20 mL at 03/05/22 1035   Current Outpatient Medications on File Prior to Encounter  Medication Sig Dispense Refill   acetaminophen (TYLENOL) 500 MG tablet Take 100 mg by mouth every 8 (eight) hours as needed for  mild pain or fever.     albuterol (PROVENTIL HFA;VENTOLIN HFA) 108 (90 Base) MCG/ACT inhaler Inhale 1-2 puffs into the lungs every 6 (six) hours as needed for wheezing or shortness of breath. (Patient taking differently: Inhale 2 puffs into the lungs every 4 (four) hours as needed for wheezing or shortness of breath.) 1 Inhaler 1   amLODipine (NORVASC) 10 MG tablet Take 10 mg by mouth daily.     atorvastatin (LIPITOR) 40 MG tablet Take 1 tablet (40 mg total) by mouth daily. 90 tablet 3   carbamide peroxide (DEBROX) 6.5 % OTIC solution Place 5 drops into both ears daily as needed (wax buildup).     dorzolamide (TRUSOPT) 2 % ophthalmic solution Place 1 drop into  both eyes 2 (two) times daily.      furosemide (LASIX) 40 MG tablet Take 40-80 mg by mouth daily as needed for edema or fluid.  5   Multiple Vitamin (MULTIVITAMIN) capsule Take 1 capsule by mouth daily.     metoprolol succinate (TOPROL-XL) 25 MG 24 hr tablet TAKE 1 TABLET BY MOUTH ONCE DAILY (Patient taking differently: Take 25 mg by mouth daily.) 15 tablet 0     Scheduled Meds:  Chlorhexidine Gluconate Cloth  6 each Topical Daily   dorzolamide  1 drop Both Eyes BID   heparin injection (subcutaneous)  5,000 Units Subcutaneous Q8H   insulin aspart  0-15 Units Subcutaneous TID WC   insulin aspart  0-5 Units Subcutaneous QHS   metoprolol tartrate  5 mg Intravenous Q6H   mometasone-formoterol  2 puff Inhalation BID   sodium chloride flush  3 mL Intravenous Q12H   umeclidinium bromide  1 puff Inhalation Daily   Continuous Infusions:  sodium chloride 500 mL (04/26/22 0006)    ceFAZolin (ANCEF) IV     cefTRIAXone (ROCEPHIN)  IV Stopped (05/02/22 0850)   dextrose 75 mL/hr at 05/02/22 1314   PRN Meds:.sodium chloride, acetaminophen, HYDROmorphone (DILAUDID) injection, levalbuterol, ondansetron (ZOFRAN) IV, sodium chloride flush  Allergies: No Known Allergies  Family History  Problem Relation Age of Onset   Hypertension Father    Breast cancer Father    CVA Father    Diabetes Mother    Hypertension Mother    CAD Sister    Hypertension Sister    Diabetes Sister    Heart disease Brother    Diabetes Brother    Breast cancer Sister    Hypertension Sister    Diabetes Sister    Heart disease Sister     Social History:  reports that he quit smoking about 20 years ago. His smoking use included cigarettes. He has never used smokeless tobacco. He reports current alcohol use. He reports that he does not use drugs.  ROS: A complete review of systems was performed.  All systems are negative except for pertinent findings as noted.  Physical Exam:  Vital signs in last 24 hours: Temp:   [97.6 F (36.4 C)-98.8 F (37.1 C)] 98.6 F (37 C) (05/18 1647) Pulse Rate:  [81-100] 100 (05/18 1647) Resp:  [19-25] 20 (05/18 1647) BP: (108-154)/(53-78) 124/60 (05/18 1647) SpO2:  [92 %-99 %] 93 % (05/18 1700) Weight:  [105 kg] 105 kg (05/18 0443) Constitutional:  Alert and oriented, No acute distress Cardiovascular: Regular rate and rhythm Respiratory: Normal respiratory effort, Lungs clear bilaterally GI: Abdomen is soft, nontender, nondistended, no abdominal masses Genitourinary: No CVAT. Normal male phallus, testes are descended bilaterally and non-tender and without masses, scrotum is normal in appearance without  lesions or masses, perineum is normal on inspection. Rectal: Prostate 40cc, no nodules Neurologic: Grossly intact, no focal deficits Psychiatric: Normal mood and affect  Laboratory Data:  Recent Labs    05/01/22 0344 05/02/22 0444  WBC 17.5* 14.1*  HGB 12.7* 15.3  HCT 40.1 50.2  PLT 346 221    Recent Labs    04/30/22 0603 04/30/22 1616 05/01/22 0344 05/02/22 0444  NA 149* 152* 150* 145  K 3.7 3.6 3.3* 4.1  CL 107 112* 114* 108  GLUCOSE 163* 123* 182* 175*  BUN 73* 74* 57* 41*  CALCIUM 8.6* 8.3* 8.4* 8.2*  CREATININE 3.28* 2.86* 1.60* 1.30*     Results for orders placed or performed during the hospital encounter of 04/25/22 (from the past 24 hour(s))  Glucose, capillary     Status: Abnormal   Collection Time: 05/01/22  8:36 PM  Result Value Ref Range   Glucose-Capillary 141 (H) 70 - 99 mg/dL  Glucose, capillary     Status: Abnormal   Collection Time: 05/01/22 11:52 PM  Result Value Ref Range   Glucose-Capillary 193 (H) 70 - 99 mg/dL  CBC     Status: Abnormal   Collection Time: 05/02/22  4:44 AM  Result Value Ref Range   WBC 14.1 (H) 4.0 - 10.5 K/uL   RBC 5.30 4.22 - 5.81 MIL/uL   Hemoglobin 15.3 13.0 - 17.0 g/dL   HCT 50.2 39.0 - 52.0 %   MCV 94.7 80.0 - 100.0 fL   MCH 28.9 26.0 - 34.0 pg   MCHC 30.5 30.0 - 36.0 g/dL   RDW 13.4 11.5 -  15.5 %   Platelets 221 150 - 400 K/uL   nRBC 0.0 0.0 - 0.2 %  Basic metabolic panel     Status: Abnormal   Collection Time: 05/02/22  4:44 AM  Result Value Ref Range   Sodium 145 135 - 145 mmol/L   Potassium 4.1 3.5 - 5.1 mmol/L   Chloride 108 98 - 111 mmol/L   CO2 25 22 - 32 mmol/L   Glucose, Bld 175 (H) 70 - 99 mg/dL   BUN 41 (H) 8 - 23 mg/dL   Creatinine, Ser 1.30 (H) 0.61 - 1.24 mg/dL   Calcium 8.2 (L) 8.9 - 10.3 mg/dL   GFR, Estimated 56 (L) >60 mL/min   Anion gap 12 5 - 15  Glucose, capillary     Status: Abnormal   Collection Time: 05/02/22  4:52 AM  Result Value Ref Range   Glucose-Capillary 162 (H) 70 - 99 mg/dL  Glucose, capillary     Status: Abnormal   Collection Time: 05/02/22  7:49 AM  Result Value Ref Range   Glucose-Capillary 177 (H) 70 - 99 mg/dL  Glucose, capillary     Status: Abnormal   Collection Time: 05/02/22 12:13 PM  Result Value Ref Range   Glucose-Capillary 143 (H) 70 - 99 mg/dL  Glucose, capillary     Status: Abnormal   Collection Time: 05/02/22  4:45 PM  Result Value Ref Range   Glucose-Capillary 154 (H) 70 - 99 mg/dL   Recent Results (from the past 240 hour(s))  Resp Panel by RT-PCR (Flu A&B, Covid) Nasopharyngeal Swab     Status: None   Collection Time: 04/25/22 11:04 AM   Specimen: Nasopharyngeal Swab; Nasopharyngeal(NP) swabs in vial transport medium  Result Value Ref Range Status   SARS Coronavirus 2 by RT PCR NEGATIVE NEGATIVE Final    Comment: (NOTE) SARS-CoV-2 target nucleic acids are NOT DETECTED.  The SARS-CoV-2 RNA is generally detectable in upper respiratory specimens during the acute phase of infection. The lowest concentration of SARS-CoV-2 viral copies this assay can detect is 138 copies/mL. A negative result does not preclude SARS-Cov-2 infection and should not be used as the sole basis for treatment or other patient management decisions. A negative result may occur with  improper specimen collection/handling, submission of  specimen other than nasopharyngeal swab, presence of viral mutation(s) within the areas targeted by this assay, and inadequate number of viral copies(<138 copies/mL). A negative result must be combined with clinical observations, patient history, and epidemiological information. The expected result is Negative.  Fact Sheet for Patients:  EntrepreneurPulse.com.au  Fact Sheet for Healthcare Providers:  IncredibleEmployment.be  This test is no t yet approved or cleared by the [place] FDA and  has been authorized for detection and/or diagnosis of SARS-CoV-2 by FDA under an Emergency Use Authorization (EUA). This EUA will remain  in effect (meaning this test can be used) for the duration of the COVID-19 declaration under Section 564(b)(1) of the Act, 21 U.S.C.section 360bbb-3(b)(1), unless the authorization is terminated  or revoked sooner.       Influenza A by PCR NEGATIVE NEGATIVE Final   Influenza B by PCR NEGATIVE NEGATIVE Final    Comment: (NOTE) The Xpert Xpress SARS-CoV-2/FLU/RSV plus assay is intended as an aid in the diagnosis of influenza from Nasopharyngeal swab specimens and should not be used as a sole basis for treatment. Nasal washings and aspirates are unacceptable for Xpert Xpress SARS-CoV-2/FLU/RSV testing.  Fact Sheet for Patients: EntrepreneurPulse.com.au  Fact Sheet for Healthcare Providers: IncredibleEmployment.be  This test is not yet approved or cleared by the [place] FDA and has been authorized for detection and/or diagnosis of SARS-CoV-2 by FDA under an Emergency Use Authorization (EUA). This EUA will remain in effect (meaning this test can be used) for the duration of the COVID-19 declaration under Section 564(b)(1) of the Act, 21 U.S.C. section 360bbb-3(b)(1), unless the authorization is terminated or revoked.  Performed at [facility], [address] [place], [place] [address]   Blood culture (routine x 2)     Status: Abnormal   Collection Time: 04/25/22 12:15 PM   Specimen: Right Antecubital; Blood  Result Value Ref Range Status   Specimen Description   Final    RIGHT ANTECUBITAL Performed at [facility], [address] [place], [place] [address]    Special Requests   Final    BOTTLES DRAWN AEROBIC AND ANAEROBIC Blood Culture adequate volume Performed at [facility], [address] [place], [place] [address]    Culture  Setup Time   Final    GRAM POSITIVE COCCI IN CHAINS ANAEROBIC BOTTLE ONLY CRITICAL RESULT CALLED TO, READ BACK BY AND VERIFIED WITH: PHARMD A. MEYER [id number] '@2111'$  FH    Culture (A)  Final    STREPTOCOCCUS ANGINOSIS THE SIGNIFICANCE OF ISOLATING THIS ORGANISM FROM A SINGLE SET OF BLOOD CULTURES WHEN MULTIPLE SETS ARE DRAWN IS UNCERTAIN. PLEASE NOTIFY THE MICROBIOLOGY DEPARTMENT WITHIN ONE WEEK IF SPECIATION AND SENSITIVITIES ARE REQUIRED. Performed at [facility], [place] [address] [place], [place] [address]    Report Status 04/28/2022 FINAL  Final  Blood Culture ID Panel (Reflexed)     Status: Abnormal   Collection Time: 04/25/22 12:15 PM  Result Value Ref Range Status   Enterococcus faecalis NOT DETECTED NOT DETECTED Final   Enterococcus Faecium NOT DETECTED NOT  DETECTED Final   Listeria monocytogenes NOT DETECTED NOT DETECTED Final   Staphylococcus species NOT DETECTED NOT DETECTED Final   Staphylococcus aureus (BCID) NOT DETECTED NOT DETECTED Final   Staphylococcus epidermidis NOT DETECTED NOT DETECTED Final   Staphylococcus lugdunensis NOT DETECTED NOT DETECTED Final   Streptococcus species DETECTED (A) NOT DETECTED Final    Comment: Not Enterococcus species, Streptococcus agalactiae, Streptococcus pyogenes, or Streptococcus pneumoniae. CRITICAL RESULT CALLED TO, READ BACK BY AND VERIFIED WITH: PHARMD A. MEYER [id number] '@2111'$  FH    Streptococcus agalactiae NOT DETECTED NOT  DETECTED Final   Streptococcus pneumoniae NOT DETECTED NOT DETECTED Final   Streptococcus pyogenes NOT DETECTED NOT DETECTED Final   A.calcoaceticus-baumannii NOT DETECTED NOT DETECTED Final   Bacteroides fragilis NOT DETECTED NOT DETECTED Final   Enterobacterales NOT DETECTED NOT DETECTED Final   Enterobacter cloacae complex NOT DETECTED NOT DETECTED Final   Escherichia coli NOT DETECTED NOT DETECTED Final   Klebsiella aerogenes NOT DETECTED NOT DETECTED Final   Klebsiella oxytoca NOT DETECTED NOT DETECTED Final   Klebsiella pneumoniae NOT DETECTED NOT DETECTED Final   Proteus species NOT DETECTED NOT DETECTED Final   Salmonella species NOT DETECTED NOT DETECTED Final   Serratia marcescens NOT DETECTED NOT DETECTED Final   Haemophilus influenzae NOT DETECTED NOT DETECTED Final   Neisseria meningitidis NOT DETECTED NOT DETECTED Final   Pseudomonas aeruginosa NOT DETECTED NOT DETECTED Final   Stenotrophomonas maltophilia NOT DETECTED NOT DETECTED Final   Candida albicans NOT DETECTED NOT DETECTED Final   Candida auris NOT DETECTED NOT DETECTED Final   Candida glabrata NOT DETECTED NOT DETECTED Final   Candida krusei NOT DETECTED NOT DETECTED Final   Candida parapsilosis NOT DETECTED NOT DETECTED Final   Candida tropicalis NOT DETECTED NOT DETECTED Final   Cryptococcus neoformans/gattii NOT DETECTED NOT DETECTED Final    Comment: Performed at [facility], [address] [place], [place] [address]  Blood culture (routine x 2)     Status: None   Collection Time: 04/25/22  1:00 PM   Specimen: BLOOD LEFT FOREARM  Result Value Ref Range Status   Specimen Description   Final    BLOOD LEFT FOREARM Performed at [facility], [address] [place], [place] [address]    Special Requests   Final    BOTTLES DRAWN AEROBIC AND ANAEROBIC Blood Culture adequate volume Performed at [facility], [address] [place], [place] [address]    Culture   Final    NO  GROWTH 5 DAYS Performed at [facility], [place] [address], [place], [place] [address]    Report Status 04/30/2022 FINAL  Final  Urine Culture     Status: None   Collection Time: 04/25/22  1:17 PM   Specimen: Urine, Clean Catch  Result Value Ref Range Status   Specimen Description   Final    URINE, CLEAN CATCH Performed at [facility], [address] [place], [place] [address]    Special Requests   Final    NONE Performed at [facility], [address] [place], [place] [address]    Culture   Final    NO GROWTH Performed at [facility], [place] [address] [place], [place] [address]    Report Status 04/26/2022 FINAL  Final  CSF culture w Gram Stain     Status: None   Collection Time: 04/28/22 12:06 PM   Specimen: CSF; Cerebrospinal  Fluid  Result Value Ref Range Status   Specimen Description CSF  Final   Special Requests Normal  Final   Gram Stain   Final    WBC PRESENT,BOTH PMN AND MONONUCLEAR NO ORGANISMS SEEN CYTOSPIN SMEAR    Culture   Final    NO GROWTH 3 DAYS Performed at [facility], [address], [place], [place] [address]    Report Status 05/01/2022 FINAL  Final    Renal Function: Recent Labs    04/28/22 0322 04/29/22 0212 04/29/22 1418 04/30/22 0603 04/30/22 1616 05/01/22 0344 05/02/22 0444  CREATININE 0.92 1.58* 2.35* 3.28* 2.86* 1.60* 1.30*   Estimated Creatinine Clearance: 53.2 mL/min (A) (by C-G formula based on SCr of 1.3 mg/dL (H)).  Radiologic Imaging: No results found.  I independently reviewed the above imaging studies.  Impression/Recommendation Gross hematuria: Possibly related to traumatic Foley catheter.  CT A/P 5/11 with approximately 40 cc prostate, no obvious renal or bladder lesions. 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 AKI: Improving  -Initial evaluation, he had 29 French Foley catheter with light pink urine.  This is irrigated without clots.  I did exchanged for a 20 cc three-way Foley catheter.  His penile urethra did not accommodate a 22 cc catheter given her likely narrowing of his fossa navicularis.  I started CBI on a low drip with clear yellow return.  I irrigated his catheter with 1 L normal saline with no return of clots. -Titrate CBI to light pink urine to clear.  Recommend manual irrigation every 4 hours or as needed for decreased drainage or clots. -Once his renal function improves, recommend CT hematuria protocol.  He will also require cystoscopy outpatient. -He has a relatively normal size prostate on imaging and exam.  It is likely that his hematuria is related to traumatic Foley catheter placement. -Regarding his retention, recommend Foley catheter in place while his acute issues resolved.  He can consider void trial afterwards.  I would recommend starting alpha-blocker if appropriate by primary team. -Noted that PET imaging recommended to further evaluate presacral and adrenal mass.  His adrenal mass potentially reflects metastatic disease.   Matt R. Gustin Zobrist MD 05/02/2022, 7:05 PM  [facility]  Pager: [phone]   CC: Holli Humbles, MD

## 2022-05-02 NOTE — Progress Notes (Addendum)
[name]PHROLOGY PROGRESS NOTE  Assessment/ Plan: Pt is a 81 y.o. yo male  with history of hypertension, IDDM, COPD with chronic hypoxia on 2 L of oxygen, CAD status post CABG, chronic neck pain, was admitted for acute on chronic respiratory failure, seen as a consultation for acute kidney injury.  #Acute kidney injury, oliguric due to contrast nephropathy (IV contrast on 5/11,5/13 and 5/14) concomitant with use of diuretics, decreased oral intake and use of vancomycin. UA without protein however spot urine PCR around 0.75,pending SPEP and free light chain The CT scan of abdomen pelvis ruled out hydronephrosis. Significant improvement of serum creatinine level with IV hydration.  He is clinically improving. Please avoid IV contrast and vancomycin if possible.  #Gross hematuria with history of acute urinary retention: He may need CBI therefore recommend urology consult.  I communicated with primary team.  #Acute on chronic hypoxic respiratory failure presumably due to pneumonia/COPD exacerbation.  Continue antibiotics, bronchodilators and oxygen.    #Hypernatremia, hypovolemic/free water deficit: Sodium level improved.  He is currently n.p.o. therefore on dextrose IV fluid.  Monitor lab.    #Acute bacterial meningitis: Status post lumbar puncture.  Seen by ID and neurology.  Vancomycin level supratherapeutic and currently on ceftriaxone.  ID is following.  I recommend to avoid vancomycin if possible because of AKI.   #Multifocal pneumonia: On broad-spectrum antibiotics.   #Aphonia and dysphagia: SLP eval, neurology, ENT and  per primary team.  Noted plan for feeding tube for dysphagia.  #Hypokalemia: Potassium level improved after repletion.    Discussed with the patient's wife was present at the bedside. I will sign off, please call back with question.  Informed primary team.  Subjective: Seen and examined at bedside.  Noted worsening hematuria and Foley bag.  He is  sleeping.  His wife was present at the bedside.  No new event.  Labs improving.  Objective Vital signs in last 24 hours: Vitals:   05/02/22 0443 05/02/22 0504 05/02/22 0750 05/02/22 0824  BP:  (!) 141/67 (!) 154/78   Pulse:  81 94   Resp:  (!) 23 19   Temp:  98.3 F (36.8 C) 98.8 F (37.1 C)   TempSrc:  Axillary Axillary   SpO2:  94% 92% 99%  Weight: 105 kg     Height:       Weight change: 1.3 kg  Intake/Output Summary (Last 24 hours) at 05/02/2022 1132 Last data filed at 05/02/2022 0836 Gross per 24 hour  Intake 2084.76 ml  Output 800 ml  Net 1284.76 ml        Labs: Basic Metabolic Panel: Recent Labs  Lab 04/30/22 1616 05/01/22 0344 05/02/22 0444  NA 152* 150* 145  K 3.6 3.3* 4.1  CL 112* 114* 108  CO2 '29 28 25  '$ GLUCOSE 123* 182* 175*  BUN 74* 57* 41*  CREATININE 2.86* 1.60* 1.30*  CALCIUM 8.3* 8.4* 8.2*    Liver Function Tests: Recent Labs  Lab 04/28/22 0654 04/29/22 1418  AST 11* 13*  ALT 12 11  ALKPHOS 103 96  BILITOT 0.7 0.7  PROT 6.4* 6.5  ALBUMIN 2.4* 2.4*    No results for input(s): LIPASE, AMYLASE in the last 168 hours. No results for input(s): AMMONIA in the last 168 hours. CBC: Recent Labs  Lab 04/27/22 0412 04/28/22 0322 04/29/22 1418 05/01/22 0344 05/02/22 0444  WBC 16.8* 18.6* 20.6* 17.5* 14.1*  NEUTROABS 13.8*  --  16.1*  --   --   HGB 13.4  15.1 14.8 12.7* 15.3  HCT 41.9 47.6 46.7 40.1 50.2  MCV 91.3 91.4 92.5 93.9 94.7  PLT 364 412* 429* 346 221    Cardiac Enzymes: No results for input(s): CKTOTAL, CKMB, CKMBINDEX, TROPONINI in the last 168 hours. CBG: Recent Labs  Lab 05/01/22 1740 05/01/22 2036 05/01/22 2352 05/02/22 0452 05/02/22 0749  GLUCAP 156* 141* 193* 162* 177*     Iron Studies: No results for input(s): IRON, TIBC, TRANSFERRIN, FERRITIN in the last 72 hours. Studies/Results: No results found.  Medications: Infusions:  sodium chloride 500 mL (04/26/22 0006)   cefTRIAXone (ROCEPHIN)  IV 2 g  (05/02/22 0820)   dextrose Stopped (05/01/22 2042)    Scheduled Medications:  Chlorhexidine Gluconate Cloth  6 each Topical Daily   dorzolamide  1 drop Both Eyes BID   heparin injection (subcutaneous)  5,000 Units Subcutaneous Q8H   insulin aspart  0-15 Units Subcutaneous TID WC   insulin aspart  0-5 Units Subcutaneous QHS   metoprolol tartrate  5 mg Intravenous Q6H   mometasone-formoterol  2 puff Inhalation BID   sodium chloride flush  3 mL Intravenous Q12H   umeclidinium bromide  1 puff Inhalation Daily    have reviewed scheduled and prn medications.  Physical Exam: General: Resting, not in distress Heart:RRR, s1s2 nl Lungs: Coarse breath sound bilateral Abdomen:soft, Non-tender, non-distended Extremities:No edema Neurology: Able to open eyes,.  Jodeci Roarty Prasad Gisela Lea 05/02/2022,11:32 AM  LOS: 7 days

## 2022-05-02 NOTE — Progress Notes (Signed)
[facility]  Date of Admission:  04/25/2022     Abx: 5/11-c ceftriaxone; 5/14 q12 hour dosing starts   5/17-18 linezolid 5/14-16 vanc 5/14-16 amp 5/11-14 azith                                                        Assessment: 81 yo male copd on home 2 l o2, dysphagia, HFrEF, dm2, cad s/p cabg, djd with cervical neck pain, admitted 5/11 initially with chest pain/dyspnea and acute on chronic hypoxic resp failure initially treated for CAP, but subsequently found to have ventriculitis/meningitis, currently complicated by also dysphagia/hypophonia, aki, and demand ischemia       Initial chest CTA no PE or opacity He had ct had and subsequent mri (ordered for complaint of neck pain) that showed periventricular white matter 83m hyperdense focus and layering within the occipital horns of lateral ventricles suggesting SAH vs meningitis.    Neurology evaluated and performed LP 3 days after he has been on CAP abx treatment. They also suspect acute cva of the left frontotemporal region   5/14 LP -- hazy; wbc 1300; rbc 90; neutrophilic 841% protein >>937 glucose<20. I do not see reported xanthochromia and [facility]  Imaging does suggest ventriculitis as well with layering density in the ventricles He was started on meningitis empiric tx with the LP result and imaging/presentation   Of note, he has strep anginosus in 1 of 4 bottles this admission. Will see if the csf cx grows anything. Strep angi causes abscess commonly and the mri presentation is rather atypical. I am not sure what to make of it at this time -- he is on appropriate coverage abx for it as well   Of note, his abd/pelv ct portion showed presacral mass like density and adrenal mass. Primary team awared and ponder work up  Ent following along for hypophonia which they suspect could be related to cva   ----------- 5/18 assessment Clinically mentating much  better Csf cx remains negative Csf pcr (we called [facility] this morning) and it was h. Influenza  All family had received prophylaxis dose Relayed result to IP team, who plans to report to public health department.     Plan: Finish ceftriaxone 2 gram iv q12hours; duration 2 weeks starting 5/14 until 5/28 Dc linezolid Unclear if culture will grow which we can potentially ask health department to subtype IP team to decide further on prophylaxis for staff as needed Discussed with primary team, IP team    I spent more than 50 minute reviewing data/chart, and coordinating care and >50% direct face to face time providing counseling/discussing diagnostics/treatment plan with patient   Principal Problem:   Acute on chronic respiratory failure with hypoxia ([place] Active Problems:   COPD with acute exacerbation ([facility]   Diabetes mellitus type II, non insulin dependent ([facility]   Acute on chronic systolic (congestive) heart failure (HCC)   Presacral mass   Adrenal mass (HCC)   Neck mass   Tremor   Ventriculitis of brain due to bacteria   Meningitis   Severe sepsis ([facility]   Dysphonia   Paresis of right vocal cord   No Known Allergies  Scheduled Meds:  Chlorhexidine Gluconate Cloth  6 each Topical Daily  dorzolamide  1 drop Both Eyes BID   heparin injection (subcutaneous)  5,000 Units Subcutaneous Q8H   insulin aspart  0-15 Units Subcutaneous TID WC   insulin aspart  0-5 Units Subcutaneous QHS   metoprolol tartrate  5 mg Intravenous Q6H   mometasone-formoterol  2 puff Inhalation BID   sodium chloride flush  3 mL Intravenous Q12H   umeclidinium bromide  1 puff Inhalation Daily   Continuous Infusions:  sodium chloride 500 mL (04/26/22 0006)   cefTRIAXone (ROCEPHIN)  IV Stopped (05/02/22 0850)   dextrose 75 mL/hr at 05/02/22 1314   PRN Meds:.sodium chloride, acetaminophen, HYDROmorphone (DILAUDID) injection, levalbuterol, ondansetron (ZOFRAN) IV, sodium chloride  flush   SUBJECTIVE: Continues to improve clinically and for aki as well as leukocytosis Lab confirm h flu on pcr Csf cx negative still Afebrile No n/v/diarrhea No rash     Review of Systems: ROS All other ROS was negative, except mentioned above     OBJECTIVE: Vitals:   05/02/22 0824 05/02/22 1158 05/02/22 1214 05/02/22 1300  BP:   (!) 108/53   Pulse:   98   Resp:   20   Temp:   97.6 F (36.4 C)   TempSrc:   Oral   SpO2: 99% 97% 97% 97%  Weight:      Height:       Body mass index is 37.36 kg/m.  Physical Exam   General/constitutional: sleeping HEENT: Normocephalic Cv: rrr no mrg Lungs: clear to auscultation, normal respiratory effort Abd: Soft, Nontender Ext: no edema Skin: No Rash     Lab Results Lab Results  Component Value Date   WBC 14.1 (H) 05/02/2022   HGB 15.3 05/02/2022   HCT 50.2 05/02/2022   MCV 94.7 05/02/2022   PLT 221 05/02/2022    Lab Results  Component Value Date   CREATININE 1.30 (H) 05/02/2022   BUN 41 (H) 05/02/2022   NA 145 05/02/2022   K 4.1 05/02/2022   CL 108 05/02/2022   CO2 25 05/02/2022    Lab Results  Component Value Date   ALT 11 04/29/2022   AST 13 (L) 04/29/2022   ALKPHOS 96 04/29/2022   BILITOT 0.7 04/29/2022      Microbiology: Recent Results (from the past 240 hour(s))  Resp Panel by RT-PCR (Flu A&B, Covid) Nasopharyngeal Swab     Status: None   Collection Time: 04/25/22 11:04 AM   Specimen: Nasopharyngeal Swab; Nasopharyngeal(NP) swabs in vial transport medium  Result Value Ref Range Status   SARS Coronavirus 2 by RT PCR NEGATIVE NEGATIVE Final    Comment: (NOTE) SARS-CoV-2 target nucleic acids are NOT DETECTED.  The SARS-CoV-2 RNA is generally detectable in upper respiratory specimens during the acute phase of infection. The lowest concentration of SARS-CoV-2 viral copies this assay can detect is 138 copies/mL. A negative result does not preclude SARS-Cov-2 infection and should not be used as  the sole basis for treatment or other patient management decisions. A negative result may occur with  improper specimen collection/handling, submission of specimen other than nasopharyngeal swab, presence of viral mutation(s) within the areas targeted by this assay, and inadequate number of viral copies(<138 copies/mL). A negative result must be combined with clinical observations, patient history, and epidemiological information. The expected result is Negative.  Fact Sheet for Patients:  EntrepreneurPulse.com.au  Fact Sheet for Healthcare Providers:  IncredibleEmployment.be  This test is no t yet approved or cleared by the [place] FDA and  has been authorized for detection and/or  diagnosis of SARS-CoV-2 by FDA under an Emergency Use Authorization (EUA). This EUA will remain  in effect (meaning this test can be used) for the duration of the COVID-19 declaration under Section 564(b)(1) of the Act, 21 U.S.C.section 360bbb-3(b)(1), unless the authorization is terminated  or revoked sooner.       Influenza A by PCR NEGATIVE NEGATIVE Final   Influenza B by PCR NEGATIVE NEGATIVE Final    Comment: (NOTE) The Xpert Xpress SARS-CoV-2/FLU/RSV plus assay is intended as an aid in the diagnosis of influenza from Nasopharyngeal swab specimens and should not be used as a sole basis for treatment. Nasal washings and aspirates are unacceptable for Xpert Xpress SARS-CoV-2/FLU/RSV testing.  Fact Sheet for Patients: EntrepreneurPulse.com.au  Fact Sheet for Healthcare Providers: IncredibleEmployment.be  This test is not yet approved or cleared by the [place] FDA and has been authorized for detection and/or diagnosis of SARS-CoV-2 by FDA under an Emergency Use Authorization (EUA). This EUA will remain in effect (meaning this test can be used) for the duration of the COVID-19 declaration under Section 564(b)(1) of  the Act, 21 U.S.C. section 360bbb-3(b)(1), unless the authorization is terminated or revoked.  Performed at [facility], [address] [place], [place] [address]   Blood culture (routine x 2)     Status: Abnormal   Collection Time: 04/25/22 12:15 PM   Specimen: Right Antecubital; Blood  Result Value Ref Range Status   Specimen Description   Final    RIGHT ANTECUBITAL Performed at [facility], [address] [place], [place] [address]    Special Requests   Final    BOTTLES DRAWN AEROBIC AND ANAEROBIC Blood Culture adequate volume Performed at [facility], [address] [place], [place] [address]    Culture  Setup Time   Final    GRAM POSITIVE COCCI IN CHAINS ANAEROBIC BOTTLE ONLY CRITICAL RESULT CALLED TO, READ BACK BY AND VERIFIED WITH: PHARMD A. MEYER [id number] '@2111'$  FH    Culture (A)  Final    STREPTOCOCCUS ANGINOSIS THE SIGNIFICANCE OF ISOLATING THIS ORGANISM FROM A SINGLE SET OF BLOOD CULTURES WHEN MULTIPLE SETS ARE DRAWN IS UNCERTAIN. PLEASE NOTIFY THE MICROBIOLOGY DEPARTMENT WITHIN ONE WEEK IF SPECIATION AND SENSITIVITIES ARE REQUIRED. Performed at [facility], [place] [address], [place], [place] [address]    Report Status 04/28/2022 FINAL  Final  Blood Culture ID Panel (Reflexed)     Status: Abnormal   Collection Time: 04/25/22 12:15 PM  Result Value Ref Range Status   Enterococcus faecalis NOT DETECTED NOT DETECTED Final   Enterococcus Faecium NOT DETECTED NOT DETECTED Final   Listeria monocytogenes NOT DETECTED NOT DETECTED Final   Staphylococcus species NOT DETECTED NOT DETECTED Final   Staphylococcus aureus (BCID) NOT DETECTED NOT DETECTED Final   Staphylococcus epidermidis NOT DETECTED NOT DETECTED Final   Staphylococcus lugdunensis NOT DETECTED NOT DETECTED Final   Streptococcus species DETECTED (A) NOT DETECTED Final    Comment: Not Enterococcus species, Streptococcus agalactiae, Streptococcus pyogenes, or Streptococcus  pneumoniae. CRITICAL RESULT CALLED TO, READ BACK BY AND VERIFIED WITH: PHARMD A. BTDHR [id number] '@2111'$  FH    Streptococcus agalactiae NOT DETECTED NOT DETECTED Final   Streptococcus pneumoniae NOT DETECTED NOT DETECTED Final   Streptococcus pyogenes NOT DETECTED NOT DETECTED Final   A.calcoaceticus-baumannii NOT DETECTED NOT DETECTED Final   Bacteroides fragilis NOT DETECTED NOT DETECTED Final   Enterobacterales NOT DETECTED NOT DETECTED Final   Enterobacter cloacae complex NOT  DETECTED NOT DETECTED Final   Escherichia coli NOT DETECTED NOT DETECTED Final   Klebsiella aerogenes NOT DETECTED NOT DETECTED Final   Klebsiella oxytoca NOT DETECTED NOT DETECTED Final   Klebsiella pneumoniae NOT DETECTED NOT DETECTED Final   Proteus species NOT DETECTED NOT DETECTED Final   Salmonella species NOT DETECTED NOT DETECTED Final   Serratia marcescens NOT DETECTED NOT DETECTED Final   Haemophilus influenzae NOT DETECTED NOT DETECTED Final   Neisseria meningitidis NOT DETECTED NOT DETECTED Final   Pseudomonas aeruginosa NOT DETECTED NOT DETECTED Final   Stenotrophomonas maltophilia NOT DETECTED NOT DETECTED Final   Candida albicans NOT DETECTED NOT DETECTED Final   Candida auris NOT DETECTED NOT DETECTED Final   Candida glabrata NOT DETECTED NOT DETECTED Final   Candida krusei NOT DETECTED NOT DETECTED Final   Candida parapsilosis NOT DETECTED NOT DETECTED Final   Candida tropicalis NOT DETECTED NOT DETECTED Final   Cryptococcus neoformans/gattii NOT DETECTED NOT DETECTED Final    Comment: Performed at [facility], [place] [address], [place], [place] [address]  Blood culture (routine x 2)     Status: None   Collection Time: 04/25/22  1:00 PM   Specimen: BLOOD LEFT FOREARM  Result Value Ref Range Status   Specimen Description   Final    BLOOD LEFT FOREARM Performed at [facility], [address] [place], [place] [address]    Special Requests   Final    BOTTLES DRAWN AEROBIC AND  ANAEROBIC Blood Culture adequate volume Performed at [facility], [name] [place], [place] [address]    Culture   Final    NO GROWTH 5 DAYS Performed at [facility], [place] [address] [place], [place] [address]    Report Status 04/30/2022 FINAL  Final  Urine Culture     Status: None   Collection Time: 04/25/22  1:17 PM   Specimen: Urine, Clean Catch  Result Value Ref Range Status   Specimen Description   Final    URINE, CLEAN CATCH Performed at [facility], [address] [place], [place] [address]    Special Requests   Final    NONE Performed at [facility], [address] [place], [place] [address]    Culture   Final    NO GROWTH Performed at [facility], [place] [address] [place], [place] [address]    Report Status 04/26/2022 FINAL  Final  CSF culture w Gram Stain     Status: None   Collection Time: 04/28/22 12:06 PM   Specimen: CSF; Cerebrospinal Fluid  Result Value Ref Range Status   Specimen Description CSF  Final   Special Requests Normal  Final   Gram Stain   Final    WBC PRESENT,BOTH PMN AND MONONUCLEAR NO ORGANISMS SEEN CYTOSPIN SMEAR    Culture   Final    NO GROWTH 3 DAYS Performed at [facility], [place] [address], [place], [place] [address]    Report Status 05/01/2022 FINAL  Final     Serology:   Imaging: If present, new imagings (plain films, ct scans, and mri) have been personally visualized and interpreted; radiology reports have been reviewed. Decision making incorporated into the Impression / Recommendations.   5/14 cta head/neck 1. No emergent finding. No aneurysm or vascular malformation as a cause of subarachnoid findings. 2. No flow seen in the non dominant proximal right vertebral artery with  distal reconstitution. 3. Diffuse atherosclerosis with up to 50% narrowing of the right intracranial ICA. 4. Gas in the ventral canal at C3-4 without evidence of underlying acute osseous  disease, usually gas containing herniation. 5. 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   5/13 mri brain without contrast 1. Technically limited exam due to patient's inability to tolerate the full length of the study and extensive motion artifact. 2. Abnormal small volume diffusion signal abnormality layering within the occipital horns of both lateral ventricles as well as the left Sylvian fissure. Findings are nonspecific, but suspicious for possible small volume subarachnoid hemorrhage. Possible proteinaceous material/debris (as in the setting of CNS infection) would be the primary differential consideration. Correlation with laboratory values recommended. Additionally, further evaluation with LP and CSF analysis could be performed for further evaluation as warranted. 3. 4 mm focus of diffusion signal abnormality involving the posterior left frontotemporal region, suspicious for a small acute ischemic infarct. 4. Age-related cerebral atrophy with moderate to severe chronic microvascular ischemic disease.   5/11 chest abd pelv ct with contrast Intermediate attenuation mass presacral space 7.0 x 4.6 x 6.1 cm, nonspecific, uncertain etiology. Differential diagnosis would include complicated rectal duplication cyst, metastatic lesion, teratoma, lymphoma; in light of coexistent RIGHT adrenal mass, consider PET-CT for further evaluation to assess for potential primary tumor and to guide for most accessible site for biopsy.   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 Surgical consultation is recommended, though see above recommendation first. Consider biochemical lab evaluation for functional status and pheochromocytoma prior to resection.   Dependent calculi within urinary bladder.   Cholelithiasis.   Extensive distal colonic diverticulosis without evidence of diverticulitis.   Small hiatal hernia.   Aortic Atherosclerosis  Jabier Mutton, [facility]  Infectious Disease [place] [phone] pager    05/02/2022, 1:34 PM

## 2022-05-02 NOTE — Progress Notes (Signed)
Nutrition Follow-up  DOCUMENTATION CODES:  Obesity unspecified  INTERVENTION:  When enteral access is available, recommend the following: Glucerna 1.5 at goal rate of 53m/h (13245md) Start infusion at 2578m and advance by 10 q6h to goal of 6m41mFree water flush of 100 q4h This regimen will provide 1980kcal, 109g of protein, 176g of carbs, and 1602mL2mfree water (TF+flush) Pt is at risk for refeeding. When nutrition is initiated, monitor K, Mg, and phosphorus for at least 48 hours and replace as needed.   NUTRITION DIAGNOSIS:  Inadequate oral intake related to inability to eat, poor appetite as evidenced by per patient/family report. - remains applicable  GOAL:  Patient will meet greater than or equal to 90% of their needs - progressing, PEG to be placed for nutrition  MONITOR:  PO intake, Supplement acceptance, Diet advancement, Labs, Weight trends  REASON FOR ASSESSMENT:  Consult Assessment of nutrition requirement/status  ASSESSMENT:  Pt admitted with acute on chronic respiratory failure with hypoxia. PMH significant for COPD, HFrEF (40%), T2DM and CAD s/p CABG.  Since last assessment, pt developed chest tightness with hypoxia on 5/14 requiting BiPAP. Imaging obtained and MRI findings reflect small volume subarachnoid hemorrhage versus meningitis. LP obtained and results were concerning for bacterial meningitis. Pt made NPO 5/14 due to decreased alertness and inability to swallow pills. MBS 5/15 showed silent aspiration.  5/14 - LP (results showed bacterial meningitis) 5/15 - MBS, SLP Diet Recommendations: NPO due to silent aspiration  Pt receiving a bath at the time of assessment. Discussed with RN who reports that pt is off BiPAP this AM and has been requiring HFNC. Reports that pt is sleepy, but will rouse easily.   Reviewed the events over the last few days with MD and the nutrition plan moving forward as pt has been NPO for 4 days now. MD reports long discussion  was had with wife yesterday and this morning it was decided to explore PEG placement as pt will need it moving forward due to his silent aspiration. Radiology consult pending.   Pt may require cortrak tube placement in the interim depending on when g-tube can be placed. Will monitor tube discussions and will place recommendations above to be utilized when access is available.    Nutritionally Relevant Medications: Scheduled Meds:  insulin aspart  0-15 Units Subcutaneous TID WC   insulin aspart  0-5 Units Subcutaneous QHS   Continuous Infusions:  cefTRIAXone (ROCEPHIN)  IV 2 g (05/02/22 0820)   dextrose Stopped (05/01/22 2042)   linezolid (ZYVOX) IV 600 mg (05/01/22 2208)   PRN Meds: ondansetron  Labs Reviewed: BUN 41, creatinine 1.3 CBG ranges from 141-219 mg/dL over the last 24 hours HgbA1c 6.5% (5/12)  NUTRITION - FOCUSED PHYSICAL EXAM: Flowsheet Row Most Recent Value  Orbital Region No depletion  Upper Arm Region No depletion  Thoracic and Lumbar Region No depletion  Buccal Region No depletion  Temple Region No depletion  Clavicle Bone Region No depletion  Clavicle and Acromion Bone Region No depletion  Scapular Bone Region No depletion  Dorsal Hand Mild depletion  Patellar Region Mild depletion  Anterior Thigh Region No depletion  Posterior Calf Region No depletion  Edema (RD Assessment) None  Hair Reviewed  Eyes Reviewed  Mouth Reviewed  Skin Reviewed  Nails Reviewed   Diet Order:   Diet Order             Diet NPO time specified  Diet effective now  EDUCATION NEEDS:  Education needs have been addressed  Skin:  Skin Assessment: Reviewed RN Assessment  Last BM:  5/11 per RN documentation  Height:  Ht Readings from Last 1 Encounters:  04/26/22 '5\' 6"'$  (1.676 m)   Weight:  Wt Readings from Last 1 Encounters:  05/02/22 105 kg   Ideal Body Weight:  64.5 kg  BMI:  Body mass index is 37.36 kg/m.  Estimated Nutritional Needs:   Kcal:  1800-2000 Protein:  90-105g Fluid:  >/=1.8L   Ranell Patrick, RD, LDN Clinical Dietitian RD pager # available in [facility]  After hours/weekend pager # available in [facility]

## 2022-05-02 NOTE — Progress Notes (Signed)
Pt not wearing BIPAP remains on high flow nasal cannula.

## 2022-05-02 NOTE — Progress Notes (Signed)
ENT follow-up, cross coverage for Dr. Marcelline Deist.  On exam today the patient is resting quietly and breathing nicely.  He has complete aphonia.  He can barely whisper.  He is not able to generate any pressures with a cough.  Since he is at high risk for aspiration and has a paralyzed vocal cord he should be strictly n.p.o. according to the speech pathologist recommendations.  I agree with this completely.  Surgical medialization laryngoplasty might be beneficial to help him swallow better but he is not a very good surgical candidate currently.  He is at high risk for complications such as additional cardiovascular problems or cerebrovascular problems.  If he is in a better place in his overall medical situation then we can entertain that possibility.  Contact us as needed.   J38.01

## 2022-05-02 NOTE — Progress Notes (Signed)
PHARMACY CONSULT NOTE FOR:  OUTPATIENT  PARENTERAL ANTIBIOTIC THERAPY (OPAT)  Indication: Meningitis Regimen: Ceftriaxone 2g IV q 12h End date: 05/12/22  IV antibiotic discharge orders are pended. To discharging provider:  please sign these orders via discharge navigator,  Select New Orders & click on the button choice - Manage This Unsigned Work.     Thank you for involving pharmacy in this patient's care.  Elita Quick, PharmD PGY1 Ambulatory Care Pharmacy Resident 05/02/2022 2:58 PM  **Pharmacist phone directory can be found on [email or web address] listed under [place]**

## 2022-05-02 NOTE — Care Management Important Message (Signed)
Important Message  Patient Details  Name: [name] MRN: [id number] Date of Birth: [birth year]   Medicare Important Message Given:  Yes     [name] 05/02/2022, 9:39 AM

## 2022-05-02 NOTE — Progress Notes (Signed)
Inpatient Rehabilitation Admissions Coordinator   I met at bedside with patient , his wife and Maija, PT. I briefly discussed goals and expectations of a possible CIR admit. Patient not yet at a level to tolerate the intensity required of  CIR admit. I will follow his progress to assist in determining If he will be a candidate vs SNF at [facility]. Wife is in agreement.   Barbara Boyette, RN, MSN Rehab Admissions Coordinator [phone] 05/02/2022 11:51 AM  

## 2022-05-02 NOTE — TOC Progression Note (Signed)
Transition of Care [facility]) - Progression Note    Patient Details  Name: TRAYSON STITELY MRN: [id number] Date of Birth: Nov 26, 1941  Transition of Care [facility] CM/SW [place], [place] Phone Number: 05/02/2022, 3:33 PM  Clinical Narrative:     CIR following patient. Nicki with [facility] also following to see if she can offer patient SNF bed closer to patient being medically ready for dc. CSW updated patients spouse Pamala Hurry. CSW will continue to follow and assist with patients dc planning needs.  Expected Discharge Plan: [place] Barriers to Discharge: Continued Medical Work up  Expected Discharge Plan and Services Expected Discharge Plan: [place] In-house Referral: Clinical Social Work     Living arrangements for the past 2 months: Single Family Home                                       Social Determinants of Health (SDOH) Interventions    Readmission Risk Interventions     View : No data to display.

## 2022-05-03 ENCOUNTER — Inpatient Hospital Stay (HOSPITAL_COMMUNITY): Payer: Medicare Other

## 2022-05-03 DIAGNOSIS — J9621 Acute and chronic respiratory failure with hypoxia: Secondary | ICD-10-CM | POA: Diagnosis not present

## 2022-05-03 LAB — BASIC METABOLIC PANEL
Anion gap: 9 (ref 5–15)
BUN: 26 mg/dL — ABNORMAL HIGH (ref 8–23)
CO2: 25 mmol/L (ref 22–32)
Calcium: 8.3 mg/dL — ABNORMAL LOW (ref 8.9–10.3)
Chloride: 111 mmol/L (ref 98–111)
Creatinine, Ser: 0.92 mg/dL (ref 0.61–1.24)
GFR, Estimated: >60 mL/min (ref >60)
Glucose, Bld: 139 mg/dL — ABNORMAL HIGH (ref 70–99)
Potassium: 3.4 mmol/L — ABNORMAL LOW (ref 3.5–5.1)
Sodium: 145 mmol/L (ref 135–145)

## 2022-05-03 LAB — CBC
HCT: 39.7 % (ref 39.0–52.0)
Hemoglobin: 12.2 g/dL — ABNORMAL LOW (ref 13.0–17.0)
MCH: 29.5 pg (ref 26.0–34.0)
MCHC: 30.7 g/dL (ref 30.0–36.0)
MCV: 96.1 fL (ref 80.0–100.0)
Platelets: 313 10*3/uL (ref 150–400)
RBC: 4.13 MIL/uL — ABNORMAL LOW (ref 4.22–5.81)
RDW: 13.4 % (ref 11.5–15.5)
WBC: 13.7 10*3/uL — ABNORMAL HIGH (ref 4.0–10.5)
nRBC: 0 % (ref 0.0–0.2)

## 2022-05-03 LAB — GLUCOSE, CAPILLARY
Glucose-Capillary: 137 mg/dL — ABNORMAL HIGH (ref 70–99)
Glucose-Capillary: 140 mg/dL — ABNORMAL HIGH (ref 70–99)
Glucose-Capillary: 141 mg/dL — ABNORMAL HIGH (ref 70–99)
Glucose-Capillary: 150 mg/dL — ABNORMAL HIGH (ref 70–99)
Glucose-Capillary: 152 mg/dL — ABNORMAL HIGH (ref 70–99)
Glucose-Capillary: 153 mg/dL — ABNORMAL HIGH (ref 70–99)
Glucose-Capillary: 154 mg/dL — ABNORMAL HIGH (ref 70–99)

## 2022-05-03 LAB — PHOSPHORUS: Phosphorus: 2.3 mg/dL — ABNORMAL LOW (ref 2.5–4.6)

## 2022-05-03 LAB — MAGNESIUM: Magnesium: 1.9 mg/dL (ref 1.7–2.4)

## 2022-05-03 MED ORDER — GLUCERNA 1.5 CAL PO LIQD
1000.0000 mL | ORAL | Status: DC
  Administered 2022-05-03 – 2022-05-06 (×5): 1000 mL
  Filled 2022-05-03 (×7): qty 1000

## 2022-05-03 MED ORDER — FREE WATER
100.0000 mL | Status: DC
  Administered 2022-05-03 – 2022-05-07 (×22): 100 mL

## 2022-05-03 NOTE — Progress Notes (Signed)
  Subjective: Pain controlled. Urine clear on slow drip.  Objective: Vital signs in last 24 hours: Temp:  [97.6 F (36.4 C)-98.8 F (37.1 C)] 98.6 F (37 C) (05/19 0357) Pulse Rate:  [85-100] 98 (05/19 0357) Resp:  [19-25] 21 (05/19 0357) BP: (108-154)/(53-78) 141/69 (05/19 0357) SpO2:  [91 %-99 %] 94 % (05/19 0357) Weight:  [103.3 kg] 103.3 kg (05/19 0415)  Intake/Output from previous day: 05/18 0701 - 05/19 0700 In: 119.2 [I.V.:19.2; IV Piggyback:100] Out: 7050 [Urine:7050] Intake/Output this shift: No intake/output data recorded.  Physical Exam:  General: Alert and oriented CV: RRR Lungs: Clear Abdomen: Soft, ND, NT Ext: NT, No erythema  Lab Results: Recent Labs    05/01/22 0344 05/02/22 0444 05/03/22 0236  HGB 12.7* 15.3 12.2*  HCT 40.1 50.2 39.7   BMET Recent Labs    05/02/22 0444 05/03/22 0236  NA 145 145  K 4.1 3.4*  CL 108 111  CO2 25 25  GLUCOSE 175* 139*  BUN 41* 26*  CREATININE 1.30* 0.92  CALCIUM 8.2* 8.3*     Studies/Results: No results found.  Assessment/Plan: Gross hematuria: Possibly related to traumatic Foley catheter.  CT A/P 5/11 with approximately 40 cc prostate, no obvious renal or bladder lesions. 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 AKI: Improving  -Urine is clear on slow drip CBI. CBI clamped. Ok to manually irrigate as needed for clots or decreased drainage. Ok to restart CBI if needed if hematuria worsens. Titrate to light pink. -Creatinine now at 0.92. Recommend CT hematuria protocol if ok with primary. -Keep foley to gravity. Void trial after acute issues resolve.   LOS: 8 days   Matt R. Madelyne Millikan MD 05/03/2022, 7:03 AM [facility]  Pager: [phone]

## 2022-05-03 NOTE — Progress Notes (Addendum)
Progress Note  Patient: [name] CWC:[id number] DOB: [birth year]  DOA: 04/25/2022  DOS: 05/03/2022    Brief hospital course: [name] is a 81 y.o. 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 He was admitted for acute on chronic hypoxic respiratory failure with suspicion of acute CHF and/or pneumonia with subsequent CTA chest showing no PE. He was afebrile with leukocytosis and low back pain in addition to his chronic neck pain. Imaging revealed a presacral density, adrenal mass, upper normal sized mediastinal lymph nodes. CT head showed nonspecific 26m hyperdense focus if periventricular parietal white matter for which subsequent MRI was ordered. This was severely motion degraded and limited, but revealed abnormal diffusion signal abnormality layering within the occipital horns of lateral ventricles and Sylvian fissure suggestive of SAH vs. meningitis. Neurology was consulted, performed LP which is consistent with bacterial meningitis. 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  Assessment and Plan:  Acute on chronic hypoxic respiratory failure:  Presumably due primarily to aspiration pneumonia, CTA chest without PE.  There is evidence of atelectasis, pneumonia, and interstitial edema.  Continue to supplement oxygen as needed to maintain normal respiratory effort and SpO2 >89%.  Patient is confirmed to be DNR/DNI. Continue baseline COPD medications and prn albuterol. No wheezing.  Encourage frequent incentive spirometry.  Bacterial meningitis: Concurrent metabolic encephalopathy/rule out hospital delirium Negative gram stain, though Neutrophils severely elevated with low glucose, high protein on LP 5/14.  -  ID consulted - will complete ceftriaxone 2 gram q12hrs on 5/28 - Continue droplet precautions.  - Household contacts over the past 7 days have been given chemoprophylaxis on 5/14. No further needs in this regard per multiple physicians. -Defer to ID for further imaging needs.   Acute renal failure, resolving:  Secondary to multiple insults including lasix, NPO, repeated contrast dye loads, vancomycin.  Transition from IV D5 to free water flushed via cortrak as below. Will need to discuss NGT at least for free water in the short term given ongoing aspiration risk  Acute urinary retention Concurrent hematuria:  Continue Foley catheter, urine output improving Urology following, appreciate insight recommendations, continue CBI/flushing per protocol  Severe lumbago, resolved -Likely in the setting of meningitis -Appears to be resolving over the past 24 hours -Continue tylenol, robaxin, heating pad -Dilaudid for breakthrough pain only -he has used these medications sparingly over the past 24 hours.  Acute on chronic HFREF:  LVEF 45-50%, based on more recent echo, LVEF has improved to 50-55%.  Holding lasix - cardiology consulted.  - Continue metoprolol, giving IV scheduled while NPO.  Acute CVA:  Suggested by [id number] signal abnormality involving the posterior left frontotemporal region on limited MRI.  - Ok to start ASA, though made strict NPO due to ongoing silent aspiration.  - Cortrak placed 5/19 - plan to transition to PEG next week if no meaningful improvement in swallowing  CAD s/p CABG:  Troponin initially negative at 14, subsequent evidence of demand ischemia (up to 56) due to sepsis. ACS is not currently suspected.  - Continue aspirin, metoprolol, statin - Cardiology consulted, no changes to management or ischemic work up currently planned.  Multifocal pneumonia:  - Increasing consolidations on CTA coinciding with respiratory deterioration. -  Started abx and will  continue as above.  - Monitor blood cultures drawn at admission (unchanged 1 of 4 S. anginosis and other is NGTD x5 days)  Presacral mass:  Indeterminate, incidental. Not rim-enhancing. 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 - Renin and aldosterone pending - Cortisol is 49.2. Metanephrines negative. - Needs PET to delineate this as well as presacral density on CT.   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 Not consistent with seizure. improving  NIDT2DM: HbA1c 6.5%, controlled.  - Continue moderate SSI, hold metformin with contrast exposures.   Obesity: Estimated body mass index is 36.76 kg/m as calculated from the following:   Height as of this encounter: '5\' 6"'$  (1.676 m).   Weight as of this encounter: 103.3 kg.  Subjective: No acute issues or events overnight, awake sitting in bedside chair today wife present, patient still somnolent but easily arousable answer simple questions appropriately  Objective: Vitals:   05/03/22 0357 05/03/22 0415 05/03/22 0737 05/03/22 0739  BP: (!) 141/69  120/61   Pulse: 98  87   Resp: (!) 21  19   Temp: 98.6 F (37 C)  97.6 F (36.4 C)   TempSrc: Axillary  Axillary   SpO2: 94%  95% 95%  Weight:  103.3 kg    Height:       General:  Pleasantly resting in chair, No acute distress. HEENT: Speech quiet. Neck:  Without mass or deformity.  Trachea is midline. Lungs: Scant bibasilar rales. Heart:  Regular rate and rhythm.  Without murmurs, rubs, or gallops. Abdomen:  Soft, nontender, nondistended.  Without guarding or rebound. Extremities: Without cyanosis, clubbing, edema, or obvious deformity. Vascular:  Dorsalis pedis and posterior tibial pulses palpable bilaterally. Skin:  Warm and dry, no erythema, no ulcerations.  Data Personally reviewed: CBC: Recent  Labs  Lab 04/27/22 0412 04/28/22 0322 04/29/22 1418 05/01/22 0344 05/02/22 0444 05/03/22 0236  WBC 16.8* 18.6* 20.6* 17.5* 14.1* 13.7*  NEUTROABS 13.8*  --  16.1*  --   --   --   HGB 13.4 15.1 14.8 12.7* 15.3 12.2*  HCT 41.9 47.6 46.7 40.1 50.2 39.7  MCV 91.3 91.4 92.5 93.9 94.7 96.1  PLT 364 412* 429* 346 221 332    Basic Metabolic Panel: Recent Labs  Lab 04/27/22 0412 04/28/22 0322 04/29/22 0212 04/30/22 0603 04/30/22 1616 05/01/22 0344 05/02/22 0444 05/03/22 0236  NA 139 141   < > 149* 152* 150* 145 145  K 3.7 3.7   < > 3.7 3.6 3.3* 4.1 3.4*  CL 100 99   < > 107 112* 114* 108 111  CO2 30 30   < > '27 29 28 25 25  '$ GLUCOSE 218* 213*   < > 163* 123* 182* 175* 139*  BUN 24* 27*   < > 73* 74* 57* 41* 26*  CREATININE 0.74 0.92   < > 3.28* 2.86* 1.60* 1.30* 0.92  CALCIUM 9.5 9.7   < > 8.6* 8.3* 8.4* 8.2* 8.3*  MG 2.2 2.2  --   --   --   --   --   --    < > = values in this interval not displayed.    GFR: Estimated Creatinine Clearance: 74.5 mL/min (by C-G formula based on SCr of 0.92 mg/dL).  HbA1C: No  results for input(s): HGBA1C in the last 72 hours.  CBG: Recent Labs  Lab 05/02/22 2034 05/03/22 0007 05/03/22 0032 05/03/22 0401 05/03/22 0735  GLUCAP 121* 140* 141* 137* 153*    Urine analysis:    Component Value Date/Time   COLORURINE AMBER (A) 04/30/2022 0646   APPEARANCEUR CLEAR 04/30/2022 0646   LABSPEC 1.025 04/30/2022 0646   PHURINE 5.0 04/30/2022 0646   GLUCOSEU NEGATIVE 04/30/2022 0646   HGBUR MODERATE (A) 04/30/2022 0646   BILIRUBINUR NEGATIVE 04/30/2022 0646   [place] 04/30/2022 0646   PROTEINUR NEGATIVE 04/30/2022 0646   NITRITE NEGATIVE 04/30/2022 0646   LEUKOCYTESUR NEGATIVE 04/30/2022 0646   Recent Results (from the past 240 hour(s))  Resp Panel by RT-PCR (Flu A&B, Covid) Nasopharyngeal Swab     Status: None   Collection Time: 04/25/22 11:04 AM   Specimen: Nasopharyngeal Swab; Nasopharyngeal(NP) swabs in vial transport  medium  Result Value Ref Range Status   SARS Coronavirus 2 by RT PCR NEGATIVE NEGATIVE Final    Comment: (NOTE) SARS-CoV-2 target nucleic acids are NOT DETECTED.  The SARS-CoV-2 RNA is generally detectable in upper respiratory specimens during the acute phase of infection. The lowest concentration of SARS-CoV-2 viral copies this assay can detect is 138 copies/mL. A negative result does not preclude SARS-Cov-2 infection and should not be used as the sole basis for treatment or other patient management decisions. A negative result may occur with  improper specimen collection/handling, submission of specimen other than nasopharyngeal swab, presence of viral mutation(s) within the areas targeted by this assay, and inadequate number of viral copies(<138 copies/mL). A negative result must be combined with clinical observations, patient history, and epidemiological information. The expected result is Negative.  Fact Sheet for Patients:  EntrepreneurPulse.com.au  Fact Sheet for Healthcare Providers:  IncredibleEmployment.be  This test is no t yet approved or cleared by the [place] FDA and  has been authorized for detection and/or diagnosis of SARS-CoV-2 by FDA under an Emergency Use Authorization (EUA). This EUA will remain  in effect (meaning this test can be used) for the duration of the COVID-19 declaration under Section 564(b)(1) of the Act, 21 U.S.C.section 360bbb-3(b)(1), unless the authorization is terminated  or revoked sooner.       Influenza A by PCR NEGATIVE NEGATIVE Final   Influenza B by PCR NEGATIVE NEGATIVE Final    Comment: (NOTE) The Xpert Xpress SARS-CoV-2/FLU/RSV plus assay is intended as an aid in the diagnosis of influenza from Nasopharyngeal swab specimens and should not be used as a sole basis for treatment. Nasal washings and aspirates are unacceptable for Xpert Xpress SARS-CoV-2/FLU/RSV testing.  Fact Sheet for  Patients: EntrepreneurPulse.com.au  Fact Sheet for Healthcare Providers: IncredibleEmployment.be  This test is not yet approved or cleared by the [place] FDA and has been authorized for detection and/or diagnosis of SARS-CoV-2 by FDA under an Emergency Use Authorization (EUA). This EUA will remain in effect (meaning this test can be used) for the duration of the COVID-19 declaration under Section 564(b)(1) of the Act, 21 U.S.C. section 360bbb-3(b)(1), unless the authorization is terminated or revoked.  Performed at [facility], [address] [place], [place] [address]   Blood culture (routine x 2)     Status: Abnormal   Collection Time: 04/25/22 12:15 PM   Specimen: Right Antecubital; Blood  Result Value Ref Range Status   Specimen Description   Final    RIGHT ANTECUBITAL Performed at [facility], [place],  [place], [place] [address]    Special Requests   Final    BOTTLES DRAWN AEROBIC AND ANAEROBIC Blood Culture adequate volume Performed at [facility], [address] [place], [place] [address]    Culture  Setup Time   Final    GRAM POSITIVE COCCI IN CHAINS ANAEROBIC BOTTLE ONLY CRITICAL RESULT CALLED TO, READ BACK BY AND VERIFIED WITH: PHARMD A. MEYER [id number] '@2111'$  FH    Culture (A)  Final    STREPTOCOCCUS ANGINOSIS THE SIGNIFICANCE OF ISOLATING THIS ORGANISM FROM A SINGLE SET OF BLOOD CULTURES WHEN MULTIPLE SETS ARE DRAWN IS UNCERTAIN. PLEASE NOTIFY THE MICROBIOLOGY DEPARTMENT WITHIN ONE WEEK IF SPECIATION AND SENSITIVITIES ARE REQUIRED. Performed at [facility], [place] [address], [place], [place] [address]    Report Status 04/28/2022 FINAL  Final  Blood Culture ID Panel (Reflexed)     Status: Abnormal   Collection Time: 04/25/22 12:15 PM  Result Value Ref Range Status   Enterococcus faecalis NOT DETECTED NOT DETECTED Final   Enterococcus Faecium NOT DETECTED NOT DETECTED Final    Listeria monocytogenes NOT DETECTED NOT DETECTED Final   Staphylococcus species NOT DETECTED NOT DETECTED Final   Staphylococcus aureus (BCID) NOT DETECTED NOT DETECTED Final   Staphylococcus epidermidis NOT DETECTED NOT DETECTED Final   Staphylococcus lugdunensis NOT DETECTED NOT DETECTED Final   Streptococcus species DETECTED (A) NOT DETECTED Final    Comment: Not Enterococcus species, Streptococcus agalactiae, Streptococcus pyogenes, or Streptococcus pneumoniae. CRITICAL RESULT CALLED TO, READ BACK BY AND VERIFIED WITH: PHARMD A. MEYER [id number] '@2111'$  FH    Streptococcus agalactiae NOT DETECTED NOT DETECTED Final   Streptococcus pneumoniae NOT DETECTED NOT DETECTED Final   Streptococcus pyogenes NOT DETECTED NOT DETECTED Final   A.calcoaceticus-baumannii NOT DETECTED NOT DETECTED Final   Bacteroides fragilis NOT DETECTED NOT DETECTED Final   Enterobacterales NOT DETECTED NOT DETECTED Final   Enterobacter cloacae complex NOT DETECTED NOT DETECTED Final   Escherichia coli NOT DETECTED NOT DETECTED Final   Klebsiella aerogenes NOT DETECTED NOT DETECTED Final   Klebsiella oxytoca NOT DETECTED NOT DETECTED Final   Klebsiella pneumoniae NOT DETECTED NOT DETECTED Final   Proteus species NOT DETECTED NOT DETECTED Final   Salmonella species NOT DETECTED NOT DETECTED Final   Serratia marcescens NOT DETECTED NOT DETECTED Final   Haemophilus influenzae NOT DETECTED NOT DETECTED Final   Neisseria meningitidis NOT DETECTED NOT DETECTED Final   Pseudomonas aeruginosa NOT DETECTED NOT DETECTED Final   Stenotrophomonas maltophilia NOT DETECTED NOT DETECTED Final   Candida albicans NOT DETECTED NOT DETECTED Final   Candida auris NOT DETECTED NOT DETECTED Final   Candida glabrata NOT DETECTED NOT DETECTED Final   Candida krusei NOT DETECTED NOT DETECTED Final   Candida parapsilosis NOT DETECTED NOT DETECTED Final   Candida tropicalis NOT DETECTED NOT DETECTED Final   Cryptococcus neoformans/gattii NOT  DETECTED NOT DETECTED Final    Comment: Performed at [facility], [address], [place], [place] [address]  Blood culture (routine x 2)     Status: None   Collection Time: 04/25/22  1:00 PM   Specimen: BLOOD LEFT FOREARM  Result Value Ref Range Status   Specimen Description   Final    BLOOD LEFT FOREARM Performed at [facility], [address] [place], [place] [address]    Special Requests   Final    BOTTLES DRAWN AEROBIC AND ANAEROBIC Blood Culture adequate volume Performed at [facility],  [place], [place] [address]    Culture   Final    NO GROWTH 5 DAYS Performed at [facility], [place] [address] [place], [place] [address]    Report Status 04/30/2022 FINAL  Final  Urine Culture     Status: None   Collection Time: 04/25/22  1:17 PM   Specimen: Urine, Clean Catch  Result Value Ref Range Status   Specimen Description   Final    URINE, CLEAN CATCH Performed at [facility], [address] [place], [place] [address]    Special Requests   Final    NONE Performed at [facility], [address] [place], [place] [address]    Culture   Final    NO GROWTH Performed at [facility], [place] [address], [place], [place] [address]    Report Status 04/26/2022 FINAL  Final  CSF culture w Gram Stain     Status: None   Collection Time: 04/28/22 12:06 PM   Specimen: CSF; Cerebrospinal Fluid  Result Value Ref Range Status   Specimen Description CSF  Final   Special Requests Normal  Final   Gram Stain   Final    WBC PRESENT,BOTH PMN AND MONONUCLEAR NO ORGANISMS SEEN CYTOSPIN SMEAR    Culture   Final    NO GROWTH 3 DAYS Performed at [facility], [place] [address] [place], [place] [address]    Report Status 05/01/2022 FINAL  Final     No results found.   Family Communication: Spouse and SIL at bedside.  Disposition: Status is: Inpatient Remains inpatient appropriate because: Inpatient  management of bacterial meningitis and respiratory failure Planned Discharge Destination: TBD  Holli Humbles DO  05/03/2022 7:41 AM Pager - secure chat

## 2022-05-03 NOTE — Progress Notes (Signed)
Brief Nutrition Note  Consult received for enteral nutrition initiation and management. Cortrak placed today by Cortrak team (tip of tube in distal stomach per abdominal x-ray).  Will order tube feeding recommendations from unit RD yesterday, 5/18: - Start Glucerna 1.5 @ 25 ml/hr and advance by 10 ml q 6 hours to goal rate of 55 ml/hr (1320 ml/day) - Free water flushes of 100 ml q 4 hours  Tube feeding regimen at goal rate with free water flushes provides 1980 kcal, 109 grams of protein, and 1602 ml of H2O.   Monitor magnesium, potassium, and phosphorus BID for at least 3 days, MD to replete as needed, as pt is at risk for refeeding syndrome. Labs have been ordered.  RD will continue to follow pt during acute admission. Noted plan for PEG early next week.   Gustavus Bryant, MS, RD, LDN Inpatient Clinical Dietitian Please see AMiON for contact information.

## 2022-05-03 NOTE — TOC Progression Note (Signed)
Transition of Care [facility]) - Progression Note    Patient Details  Name: [name] MRN: [id number] Date of Birth: 03/04/1941  Transition of Care [facility]) CM/SW [place], [place] Phone Number: 05/03/2022, 11:51 AM  Clinical Narrative:      CIR following patient. Nicki with [facility] also following to see if she can offer patient SNF bed closer to patient being medically ready for dc. CSW will continue to follow and assist with patients dc planning needs.  Expected Discharge Plan: [place] Barriers to Discharge: Continued Medical Work up  Expected Discharge Plan and Services Expected Discharge Plan: [place] In-house Referral: Clinical Social Work     Living arrangements for the past 2 months: Single Family Home                                       Social Determinants of Health (SDOH) Interventions    Readmission Risk Interventions     View : No data to display.

## 2022-05-03 NOTE — Progress Notes (Signed)
Physical Therapy Treatment Patient Details Name: [name] MRN: [id number] DOB: 1941-11-08 Today's Date: 05/03/2022   History of Present Illness 81 y.o. male adm 5/11 with edema and orthopnea with acute on chronic CHF and COPD. 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 5/13 MRI with small SAH vs meningitis. 5/14 respiratory distress and LP with bacterial meningitis. PMhx: COPD on O2, HFrEF, DM2, CAD s/p CABG, depression, macular degeneration, HTN, HLD    PT Comments    Pt with excellent mobility progression this session. Pt able to walk to and from bathroom with RW and +2 assist for lines/safety. Pt with whispering voice but able to clearly communicate and oriented to place but not situation. Pt following commands with delay and required 8L for mobility and 5L at rest. Will continue to follow and feel AIR would now be an appropriate D/C rec.   HR 112-124 with activity SpO2 92% on 5L at rest    Recommendations for follow up therapy are one component of a multi-disciplinary discharge planning process, led by the attending physician.  Recommendations may be updated based on patient status, additional functional criteria and insurance authorization.  Follow Up Recommendations  Acute inpatient rehab (3hours/day)     Assistance Recommended at Discharge Frequent or constant Supervision/Assistance  Patient can return home with the following Assistance with cooking/housework;Assist for transportation;A lot of help with walking and/or transfers;Help with stairs or ramp for entrance;A lot of help with bathing/dressing/bathroom   Equipment Recommendations  Rolling walker (2 wheels);BSC/3in1    Recommendations for Other Services       Precautions / Restrictions Precautions Precautions: Fall;Other (comment) Precaution Comments: watch HR and sats, foley with irrigation     Mobility  Bed Mobility Overal bed mobility: Needs Assistance Bed  Mobility: Rolling, Sidelying to Sit Rolling: Mod assist Sidelying to sit: Mod assist       General bed mobility comments: cues for sequence with assist to bend knee and rotate hips with additional assist to elevate trunk    Transfers Overall transfer level: Needs assistance   Transfers: Sit to/from Stand Sit to Stand: Min assist, +2 safety/equipment           General transfer comment: min +2 to rise from elevated bed with cues for hand placement and from [facility] with rails    Ambulation/Gait Ambulation/Gait assistance: Min assist, +2 safety/equipment Gait Distance (Feet): 15 Feet Assistive device: Rolling walker (2 wheels) Gait Pattern/deviations: Step-through pattern, Decreased stride length, Trunk flexed   Gait velocity interpretation: <1.8 ft/sec, indicate of risk for recurrent falls   General Gait Details: mod cues for posture, proximity to RW and safety with assist to advance RW and direct to avoid obstacles. pt walked 15' x 2 with seated rest and need for 8L during gait and returned to 5L at rest   Stairs             Wheelchair Mobility    Modified Rankin (Stroke Patients Only)       Balance Overall balance assessment: Needs assistance Sitting-balance support: No upper extremity supported, Feet supported Sitting balance-Leahy Scale: Fair Sitting balance - Comments: static sitting without assist at bed and toilet   Standing balance support: Reliant on assistive device for balance, During functional activity Standing balance-Leahy Scale: Poor Standing balance comment: bil UE support on RW  Cognition Arousal/Alertness: Awake/alert Behavior During Therapy: WFL for tasks assessed/performed Overall Cognitive Status: Impaired/Different from baseline Area of Impairment: Attention, Memory, Orientation                 Orientation Level: Disoriented to, Situation Current Attention Level: Sustained Memory: Decreased  short-term memory Following Commands: Follows one step commands consistently, Follows one step commands with increased time Safety/Judgement: Decreased awareness of deficits, Decreased awareness of safety   Problem Solving: Slow processing, Requires verbal cues, Requires tactile cues General Comments: needs repeated cues at times but able to follow commands to assist with mobility. Oriented to place and city        Exercises      General Comments        Pertinent Vitals/Pain Pain Assessment Pain Assessment: No/denies pain    Home Living                          Prior Function            PT Goals (current goals can now be found in the care plan section) Progress towards PT goals: Progressing toward goals    Frequency    Min 3X/week      PT Plan Discharge plan needs to be updated    Co-evaluation              AM-PAC PT "6 Clicks" Mobility   Outcome Measure  Help needed turning from your back to your side while in a flat bed without using bedrails?: A Lot Help needed moving from lying on your back to sitting on the side of a flat bed without using bedrails?: A Lot Help needed moving to and from a bed to a chair (including a wheelchair)?: A Little Help needed standing up from a chair using your arms (e.g., wheelchair or bedside chair)?: A Lot Help needed to walk in hospital room?: A Lot Help needed climbing 3-5 steps with a railing? : Total 6 Click Score: 12    End of Session Equipment Utilized During Treatment: Gait belt;Oxygen Activity Tolerance: Patient tolerated treatment well Patient left: with call bell/phone within reach;with bed alarm set;with family/visitor present;in chair Nurse Communication: Mobility status PT Visit Diagnosis: Other abnormalities of gait and mobility (R26.89);Difficulty in walking, not elsewhere classified (R26.2);Muscle weakness (generalized) (M62.81);Pain     Time: 5277-8242 PT Time Calculation (min) (ACUTE  ONLY): 44 min  Charges:  $Gait Training: 8-22 mins $Therapeutic Activity: 23-37 mins                     Alonda Weaber P, PT Acute Rehabilitation Services Pager: [phone] Office: [place] 05/03/2022, 10:03 AM

## 2022-05-03 NOTE — Progress Notes (Signed)
[facility] for Infectious Disease  Date of Admission:  04/25/2022     Abx: 5/11-c ceftriaxone; 5/14 q12 hour dosing starts   5/17-18 linezolid 5/14-16 vanc 5/14-16 amp 5/11-14 azith                                                        Assessment: 81 yo male copd on home 2 l o2, dysphagia, HFrEF, dm2, cad s/p cabg, djd with cervical neck pain, admitted 5/11 initially with chest pain/dyspnea and acute on chronic hypoxic resp failure initially treated for CAP, but subsequently found to have ventriculitis/meningitis, currently complicated by also dysphagia/hypophonia, aki, and demand ischemia       Initial chest CTA no PE or opacity He had ct had and subsequent mri (ordered for complaint of neck pain) that showed periventricular white matter 13m hyperdense focus and layering within the occipital horns of lateral ventricles suggesting SAH vs meningitis.    Neurology evaluated and performed LP 3 days after he has been on CAP abx treatment. They also suspect acute cva of the left frontotemporal region   5/14 LP -- hazy; wbc 1300; rbc 90; neutrophilic 896% protein >>759 glucose<20. I do not see reported xanthochromia and [facility]  Imaging does suggest ventriculitis as well with layering density in the ventricles He was started on meningitis empiric tx with the LP result and imaging/presentation   Of note, he has strep anginosus in 1 of 4 bottles this admission. Will see if the csf cx grows anything. Strep angi causes abscess commonly and the mri presentation is rather atypical. I am not sure what to make of it at this time -- he is on appropriate coverage abx for it as well   Of note, his abd/pelv ct portion showed presacral mass like density and adrenal mass. Primary team awared and ponder work up  Ent following along for hypophonia which they suspect could be related to cva   ----------- 5/19 assessment Wife reported today his  phonation is a little better Improving mentation/leukocytosis, appears to be at baseline mentation now Csf cx remains negative Csf pcr (we called [facility] 5/19) and it was h. Influenza. Report scanned in media   All family had received prophylaxis dose Relayed result to IP team 5/18, who plans to report to public health department.     Plan: Finished ceftriaxone 2 gram q12hrs on 5/28 No need for outpatient ID follow up Will sign off Discussed with primary team  I spent more than 35 minute reviewing data/chart, and coordinating care and >50% direct face to face time providing counseling/discussing diagnostics/treatment plan with patient    Principal Problem:   Acute on chronic respiratory failure with hypoxia ([place] Active Problems:   COPD with acute exacerbation ([place]   Diabetes mellitus type II, non insulin dependent ([place]   Acute on chronic systolic (congestive) heart failure (HCC)   Presacral mass   Adrenal mass (HCC)   Neck mass   Tremor   Ventriculitis of brain due to bacteria   Meningitis   Severe sepsis (HCC)   Dysphonia   Paresis of right vocal cord   No Known Allergies  Scheduled Meds:  Chlorhexidine Gluconate Cloth  6 each Topical Daily   dorzolamide  1 drop Both  Eyes BID   free water  100 mL Per Tube Q4H   heparin injection (subcutaneous)  5,000 Units Subcutaneous Q8H   insulin aspart  0-15 Units Subcutaneous TID WC   insulin aspart  0-5 Units Subcutaneous QHS   metoprolol tartrate  5 mg Intravenous Q6H   mometasone-formoterol  2 puff Inhalation BID   sodium chloride flush  3 mL Intravenous Q12H   umeclidinium bromide  1 puff Inhalation Daily   Continuous Infusions:  sodium chloride 500 mL (04/26/22 0006)    ceFAZolin (ANCEF) IV     cefTRIAXone (ROCEPHIN)  IV 2 g (05/03/22 1022)   dextrose 75 mL/hr at 05/02/22 1314   feeding supplement (GLUCERNA 1.5 CAL)     PRN Meds:.sodium chloride, acetaminophen, HYDROmorphone (DILAUDID) injection, levalbuterol,  ondansetron (ZOFRAN) IV, sodium chloride flush   SUBJECTIVE: Mentating well, baseline Phonation better Await g-tube procedure No n/v/diarrhea No rash No complaint otherwise outside of wanting to go home     Review of Systems: ROS All other ROS was negative, except mentioned above     OBJECTIVE: Vitals:   05/03/22 0737 05/03/22 0739 05/03/22 0741 05/03/22 0958  BP: 120/61     Pulse: 87   (!) 123  Resp: 19     Temp: 97.6 F (36.4 C)     TempSrc: Axillary     SpO2: 95% 95% 95% 92%  Weight:      Height:       Body mass index is 36.76 kg/m.  Physical Exam   General/constitutional: no distress, pleasant; in chair; some voice heard via whisper HEENT: Normocephalic, PER, Conj Clear; ngt in place Neck supple CV: rrr no mrg Lungs: clear to auscultation, normal respiratory effort Abd: Soft, Nontender Ext: no edema Skin: No Rash Neuro: nonfocal MSK: no peripheral joint swelling/tenderness/warmth; back spines nontender       Lab Results Lab Results  Component Value Date   WBC 13.7 (H) 05/03/2022   HGB 12.2 (L) 05/03/2022   HCT 39.7 05/03/2022   MCV 96.1 05/03/2022   PLT 313 05/03/2022    Lab Results  Component Value Date   CREATININE 0.92 05/03/2022   BUN 26 (H) 05/03/2022   NA 145 05/03/2022   K 3.4 (L) 05/03/2022   CL 111 05/03/2022   CO2 25 05/03/2022    Lab Results  Component Value Date   ALT 11 04/29/2022   AST 13 (L) 04/29/2022   ALKPHOS 96 04/29/2022   BILITOT 0.7 04/29/2022      Microbiology: Recent Results (from the past 240 hour(s))  Resp Panel by RT-PCR (Flu A&B, Covid) Nasopharyngeal Swab     Status: None   Collection Time: 04/25/22 11:04 AM   Specimen: Nasopharyngeal Swab; Nasopharyngeal(NP) swabs in vial transport medium  Result Value Ref Range Status   SARS Coronavirus 2 by RT PCR NEGATIVE NEGATIVE Final    Comment: (NOTE) SARS-CoV-2 target nucleic acids are NOT DETECTED.  The SARS-CoV-2 RNA is generally detectable in  upper respiratory specimens during the acute phase of infection. The lowest concentration of SARS-CoV-2 viral copies this assay can detect is 138 copies/mL. A negative result does not preclude SARS-Cov-2 infection and should not be used as the sole basis for treatment or other patient management decisions. A negative result may occur with  improper specimen collection/handling, submission of specimen other than nasopharyngeal swab, presence of viral mutation(s) within the areas targeted by this assay, and inadequate number of viral copies(<138 copies/mL). A negative result must be combined with clinical observations,  patient history, and epidemiological information. The expected result is Negative.  Fact Sheet for Patients:  EntrepreneurPulse.com.au  Fact Sheet for Healthcare Providers:  IncredibleEmployment.be  This test is no t yet approved or cleared by the [place] FDA and  has been authorized for detection and/or diagnosis of SARS-CoV-2 by FDA under an Emergency Use Authorization (EUA). This EUA will remain  in effect (meaning this test can be used) for the duration of the COVID-19 declaration under Section 564(b)(1) of the Act, 21 U.S.C.section 360bbb-3(b)(1), unless the authorization is terminated  or revoked sooner.       Influenza A by PCR NEGATIVE NEGATIVE Final   Influenza B by PCR NEGATIVE NEGATIVE Final    Comment: (NOTE) The Xpert Xpress SARS-CoV-2/FLU/RSV plus assay is intended as an aid in the diagnosis of influenza from Nasopharyngeal swab specimens and should not be used as a sole basis for treatment. Nasal washings and aspirates are unacceptable for Xpert Xpress SARS-CoV-2/FLU/RSV testing.  Fact Sheet for Patients: EntrepreneurPulse.com.au  Fact Sheet for Healthcare Providers: IncredibleEmployment.be  This test is not yet approved or cleared by the [place] FDA and has been  authorized for detection and/or diagnosis of SARS-CoV-2 by FDA under an Emergency Use Authorization (EUA). This EUA will remain in effect (meaning this test can be used) for the duration of the COVID-19 declaration under Section 564(b)(1) of the Act, 21 U.S.C. section 360bbb-3(b)(1), unless the authorization is terminated or revoked.  Performed at [facility], [address] [place], [place] [address]   Blood culture (routine x 2)     Status: Abnormal   Collection Time: 04/25/22 12:15 PM   Specimen: Right Antecubital; Blood  Result Value Ref Range Status   Specimen Description   Final    RIGHT ANTECUBITAL Performed at [facility], [address] [place], [place] [address]    Special Requests   Final    BOTTLES DRAWN AEROBIC AND ANAEROBIC Blood Culture adequate volume Performed at [facility], [address] [place], [place] [address]    Culture  Setup Time   Final    GRAM POSITIVE COCCI IN CHAINS ANAEROBIC BOTTLE ONLY CRITICAL RESULT CALLED TO, READ BACK BY AND VERIFIED WITH: PHARMD A. MEYER [id number] '@2111'$  FH    Culture (A)  Final    STREPTOCOCCUS ANGINOSIS THE SIGNIFICANCE OF ISOLATING THIS ORGANISM FROM A SINGLE SET OF BLOOD CULTURES WHEN MULTIPLE SETS ARE DRAWN IS UNCERTAIN. PLEASE NOTIFY THE MICROBIOLOGY DEPARTMENT WITHIN ONE WEEK IF SPECIATION AND SENSITIVITIES ARE REQUIRED. Performed at [facility], [place] [address], [place], [place] [address]    Report Status 04/28/2022 FINAL  Final  Blood Culture ID Panel (Reflexed)     Status: Abnormal   Collection Time: 04/25/22 12:15 PM  Result Value Ref Range Status   Enterococcus faecalis NOT DETECTED NOT DETECTED Final   Enterococcus Faecium NOT DETECTED NOT DETECTED Final   Listeria monocytogenes NOT DETECTED NOT DETECTED Final   Staphylococcus species NOT DETECTED NOT DETECTED Final   Staphylococcus aureus (BCID) NOT DETECTED NOT DETECTED Final   Staphylococcus epidermidis NOT DETECTED  NOT DETECTED Final   Staphylococcus lugdunensis NOT DETECTED NOT DETECTED Final   Streptococcus species DETECTED (A) NOT DETECTED Final    Comment: Not Enterococcus species, Streptococcus agalactiae, Streptococcus pyogenes, or Streptococcus pneumoniae. CRITICAL RESULT CALLED TO, READ BACK BY AND VERIFIED WITH: PHARMD A. MEYER [id number] '@2111'$  FH    Streptococcus agalactiae NOT DETECTED NOT DETECTED Final  Streptococcus pneumoniae NOT DETECTED NOT DETECTED Final   Streptococcus pyogenes NOT DETECTED NOT DETECTED Final   A.calcoaceticus-baumannii NOT DETECTED NOT DETECTED Final   Bacteroides fragilis NOT DETECTED NOT DETECTED Final   Enterobacterales NOT DETECTED NOT DETECTED Final   Enterobacter cloacae complex NOT DETECTED NOT DETECTED Final   Escherichia coli NOT DETECTED NOT DETECTED Final   Klebsiella aerogenes NOT DETECTED NOT DETECTED Final   Klebsiella oxytoca NOT DETECTED NOT DETECTED Final   Klebsiella pneumoniae NOT DETECTED NOT DETECTED Final   Proteus species NOT DETECTED NOT DETECTED Final   Salmonella species NOT DETECTED NOT DETECTED Final   Serratia marcescens NOT DETECTED NOT DETECTED Final   Haemophilus influenzae NOT DETECTED NOT DETECTED Final   Neisseria meningitidis NOT DETECTED NOT DETECTED Final   Pseudomonas aeruginosa NOT DETECTED NOT DETECTED Final   Stenotrophomonas maltophilia NOT DETECTED NOT DETECTED Final   Candida albicans NOT DETECTED NOT DETECTED Final   Candida auris NOT DETECTED NOT DETECTED Final   Candida glabrata NOT DETECTED NOT DETECTED Final   Candida krusei NOT DETECTED NOT DETECTED Final   Candida parapsilosis NOT DETECTED NOT DETECTED Final   Candida tropicalis NOT DETECTED NOT DETECTED Final   Cryptococcus neoformans/gattii NOT DETECTED NOT DETECTED Final    Comment: Performed at [facility], [address], [place], [place] [address]  Blood culture (routine x 2)     Status: None   Collection Time: 04/25/22  1:00 PM   Specimen:  BLOOD LEFT FOREARM  Result Value Ref Range Status   Specimen Description   Final    BLOOD LEFT FOREARM Performed at [facility], [address] [place], [place] [address]    Special Requests   Final    BOTTLES DRAWN AEROBIC AND ANAEROBIC Blood Culture adequate volume Performed at [facility], [address] [place], [place] [address]    Culture   Final    NO GROWTH 5 DAYS Performed at [facility], [place] [address], [place], [place] [address]    Report Status 04/30/2022 FINAL  Final  Urine Culture     Status: None   Collection Time: 04/25/22  1:17 PM   Specimen: Urine, Clean Catch  Result Value Ref Range Status   Specimen Description   Final    URINE, CLEAN CATCH Performed at [facility], [address] [place], [place] [address]    Special Requests   Final    NONE Performed at [facility], [place] [place], [place] [address]    Culture   Final    NO GROWTH Performed at [facility], [place] [address] [place], [place] [address]    Report Status 04/26/2022 FINAL  Final  CSF culture w Gram Stain     Status: None   Collection Time: 04/28/22 12:06 PM   Specimen: CSF; Cerebrospinal Fluid  Result Value Ref Range Status   Specimen Description CSF  Final   Special Requests Normal  Final   Gram Stain   Final    WBC PRESENT,BOTH PMN AND MONONUCLEAR NO ORGANISMS SEEN CYTOSPIN SMEAR    Culture   Final    NO GROWTH 3 DAYS Performed at [facility], [place] [address] [place], [place] [address]    Report Status 05/01/2022 FINAL  Final     Serology:   Imaging: If present, new imagings (plain films, ct scans, and mri) have been personally visualized and  interpreted; radiology reports have been reviewed. Decision making incorporated into the Impression / Recommendations.   5/14 cta head/neck 1. No emergent finding. No aneurysm or vascular malformation as a cause of subarachnoid findings. 2. No flow seen  in the non dominant proximal right vertebral artery with distal reconstitution. 3. Diffuse atherosclerosis with up to 50% narrowing of the right intracranial ICA. 4. Gas in the ventral canal at C3-4 without evidence of underlying acute osseous disease, usually gas containing herniation. 5. 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   5/13 mri brain without contrast 1. Technically limited exam due to patient's inability to tolerate the full length of the study and extensive motion artifact. 2. Abnormal small volume diffusion signal abnormality layering within the occipital horns of both lateral ventricles as well as the left Sylvian fissure. Findings are nonspecific, but suspicious for possible small volume subarachnoid hemorrhage. Possible proteinaceous material/debris (as in the setting of CNS infection) would be the primary differential consideration. Correlation with laboratory values recommended. Additionally, further evaluation with LP and CSF analysis could be performed for further evaluation as warranted. 3. 4 mm focus of diffusion signal abnormality involving the posterior left frontotemporal region, suspicious for a small acute ischemic infarct. 4. Age-related cerebral atrophy with moderate to severe chronic microvascular ischemic disease.   5/11 chest abd pelv ct with contrast Intermediate attenuation mass presacral space 7.0 x 4.6 x 6.1 cm, nonspecific, uncertain etiology. Differential diagnosis would include complicated rectal duplication cyst, metastatic lesion, teratoma, lymphoma; in light of coexistent RIGHT adrenal mass, consider PET-CT for further evaluation to assess for potential primary tumor and to guide for most accessible site for biopsy.   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 Surgical consultation is recommended, though see above recommendation first. Consider biochemical lab evaluation for functional status and pheochromocytoma prior to  resection.   Dependent calculi within urinary bladder.   Cholelithiasis.   Extensive distal colonic diverticulosis without evidence of diverticulitis.   Small hiatal hernia.   Aortic Atherosclerosis  Jabier Mutton, [place] for Infectious Disease [place] [phone] pager    05/03/2022, 11:43 AM

## 2022-05-03 NOTE — Procedures (Signed)
Cortrak  Person Inserting Tube:  Pamla Pangle D, RD Tube Type:  Cortrak - 43 inches Tube Size:  10 Tube Location:  Left nare Secured by: Bridle Technique Used to Measure Tube Placement:  Marking at nare/corner of mouth Cortrak Secured At:  68 cm  Cortrak Tube Team Note:  Consult received to place a Cortrak feeding tube.   X-ray is required, abdominal x-ray has been ordered by the Cortrak team. Please confirm tube placement before using the Cortrak tube.   If the tube becomes dislodged please keep the tube and contact the Cortrak team at [email or web address] ([email or web address]) for replacement.  If after hours and replacement cannot be delayed, place a NG tube and confirm placement with an abdominal x-ray.    [name], RD, LDN Clinical Dietitian RD pager # available in AMION  After hours/weekend pager # available in AMION  

## 2022-05-03 NOTE — Progress Notes (Signed)
Occupational Therapy Treatment Patient Details Name: [name] MRN: [id number] DOB: [birth year] Today's Date: 05/03/2022   History of present illness 81 y.o. male adm 5/11 with edema and orthopnea with acute on chronic CHF and COPD. 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 5/13 MRI with small SAH vs meningitis. 5/14 respiratory distress and LP with bacterial meningitis. PMhx: COPD on O2, HFrEF, DM2, CAD s/p CABG, depression, macular degeneration, HTN, HLD   OT comments  Pt progressed from supine to sit EOB with (A) to sequence task. Pt able to progress bil LE toward EOB.Pt oriented to visitors in room wife and sister. Pt able to share social security number to wife. Pt with sustained time sitting in chair prior to OT arrival. Recommendation to CIR.    Recommendations for follow up therapy are one component of a multi-disciplinary discharge planning process, led by the attending physician.  Recommendations may be updated based on patient status, additional functional criteria and insurance authorization.    Follow Up Recommendations  Acute inpatient rehab (3hours/day)    Assistance Recommended at Discharge Frequent or constant Supervision/Assistance  Patient can return home with the following  A little help with walking and/or transfers;A little help with bathing/dressing/bathroom;Assistance with cooking/housework;Assist for transportation;Help with stairs or ramp for entrance   Equipment Recommendations  BSC/3in1    Recommendations for Other Services Rehab consult    Precautions / Restrictions Precautions Precautions: Fall;Other (comment) Precaution Comments: watch HR and sats, foley with irrigation       Mobility Bed Mobility Overal bed mobility: Needs Assistance Bed Mobility: Rolling, Supine to Sit, Sit to Supine Rolling: Mod assist Sidelying to sit: Mod assist Supine to sit: Mod assist Sit to supine: Mod assist   General  bed mobility comments: pt requires (A) to bring bil LE toward EOB. pt requires (A) for lines leads and elevate trunk from bed with patient initiated.Pt is able to slide toward EOB and elevate from surface    Transfers Overall transfer level: Needs assistance   Transfers: Sit to/from Stand Sit to Stand: Mod assist           General transfer comment: pt able to elevate off bed surface one person (A) to scoot toward [facility]     Balance Overall balance assessment: Needs assistance Sitting-balance support: Bilateral upper extremity supported, Feet supported Sitting balance-Leahy Scale: Fair                                     ADL either performed or assessed with clinical judgement   ADL Overall ADL's : Needs assistance/impaired Eating/Feeding: NPO Eating/Feeding Details (indicate cue type and reason): ice chip with spoon mod I         Lower Body Bathing: Maximal assistance       Lower Body Dressing: Maximal assistance                 General ADL Comments: 5L HFNC throughout session    Extremity/Trunk Assessment Upper Extremity Assessment Upper Extremity Assessment: Generalized weakness;LUE deficits/detail LUE Deficits / Details: reports pain with hand held tactile input            Vision       Perception     Praxis      Cognition Arousal/Alertness: Awake/alert Behavior During Therapy: WFL for tasks assessed/performed Overall Cognitive Status: Impaired/Different from baseline  Current Attention Level: Sustained       Awareness: Intellectual Problem Solving: Slow processing General Comments: pt able to help (A) wife with SS # for claim        Exercises      Shoulder Instructions       General Comments VSS, pt reports dizziness initially with supine to sit with stable MAP 80. pt reports fatigue on arrival from prolonged sitting up in chair    Pertinent Vitals/ Pain       Pain Assessment Pain  Assessment: Faces Faces Pain Scale: Hurts little more Pain Location: hand and feet with (A) Pain Descriptors / Indicators: Grimacing Pain Intervention(s): Monitored during session, Repositioned  Home Living                                          Prior Functioning/Environment              Frequency  Min 2X/week        Progress Toward Goals  OT Goals(current goals can now be found in the care plan section)  Progress towards OT goals: Progressing toward goals  Acute Rehab OT Goals Patient Stated Goal: none stated OT Goal Formulation: With patient Time For Goal Achievement: 05/10/22 Potential to Achieve Goals: Good ADL Goals Pt Will Perform Lower Body Bathing: with supervision;with adaptive equipment;sit to/from stand Pt Will Perform Lower Body Dressing: with supervision;with adaptive equipment;sit to/from stand Pt Will Transfer to Toilet: with supervision;bedside commode;ambulating Pt Will Perform Toileting - Clothing Manipulation and hygiene: sit to/from stand;with supervision Pt Will Perform Tub/Shower Transfer: with supervision;Shower transfer;ambulating;shower seat;rolling walker  Plan Discharge plan needs to be updated    Co-evaluation                 AM-PAC OT "6 Clicks" Daily Activity     Outcome Measure   Help from another person eating meals?: A Little Help from another person taking care of personal grooming?: A Little Help from another person toileting, which includes using toliet, bedpan, or urinal?: A Lot Help from another person bathing (including washing, rinsing, drying)?: A Lot Help from another person to put on and taking off regular upper body clothing?: A Lot Help from another person to put on and taking off regular lower body clothing?: A Lot 6 Click Score: 14    End of Session Equipment Utilized During Treatment: Oxygen  OT Visit Diagnosis: Unsteadiness on feet (R26.81);Muscle weakness (generalized) (M62.81);Other  symptoms and signs involving cognitive function;Cognitive communication deficit (R41.841) Symptoms and signs involving cognitive functions: Cerebral infarction Pain - Right/Left: Right Pain - part of body: Hip   Activity Tolerance Patient tolerated treatment well   Patient Left in bed;with call bell/phone within reach;with bed alarm set;with family/visitor present   Nurse Communication Mobility status;Precautions        Time: 1460-4799 OT Time Calculation (min): 25 min  Charges: OT General Charges $OT Visit: 1 Visit OT Treatments $Self Care/Home Management : 23-37 mins   Brynn, OTR/L  Acute Rehabilitation Services Office: [phone] .   Jeri Modena 05/03/2022, 4:18 PM

## 2022-05-03 NOTE — Progress Notes (Signed)
Inpatient Rehabilitation Admissions Coordinator   Noted updates from PT with AIR recommendation. Once I receive updated OT assessment from today, I will begin insurance Auth for possible Cir admit next week pending [place] approval.  Danne Baxter, RN, MSN Rehab Admissions Coordinator [phone] 05/03/2022 2:47 PM

## 2022-05-04 DIAGNOSIS — J9621 Acute and chronic respiratory failure with hypoxia: Secondary | ICD-10-CM | POA: Diagnosis not present

## 2022-05-04 LAB — CBC
HCT: 38.2 % — ABNORMAL LOW (ref 39.0–52.0)
Hemoglobin: 11.7 g/dL — ABNORMAL LOW (ref 13.0–17.0)
MCH: 28.5 pg (ref 26.0–34.0)
MCHC: 30.6 g/dL (ref 30.0–36.0)
MCV: 93.2 fL (ref 80.0–100.0)
Platelets: 284 10*3/uL (ref 150–400)
RBC: 4.1 MIL/uL — ABNORMAL LOW (ref 4.22–5.81)
RDW: 13.2 % (ref 11.5–15.5)
WBC: 15.4 10*3/uL — ABNORMAL HIGH (ref 4.0–10.5)
nRBC: 0 % (ref 0.0–0.2)

## 2022-05-04 LAB — BASIC METABOLIC PANEL
Anion gap: 7 (ref 5–15)
BUN: 22 mg/dL (ref 8–23)
CO2: 26 mmol/L (ref 22–32)
Calcium: 8.1 mg/dL — ABNORMAL LOW (ref 8.9–10.3)
Chloride: 112 mmol/L — ABNORMAL HIGH (ref 98–111)
Creatinine, Ser: 0.77 mg/dL (ref 0.61–1.24)
GFR, Estimated: >60 mL/min (ref >60)
Glucose, Bld: 154 mg/dL — ABNORMAL HIGH (ref 70–99)
Potassium: 3.4 mmol/L — ABNORMAL LOW (ref 3.5–5.1)
Sodium: 145 mmol/L (ref 135–145)

## 2022-05-04 LAB — MAGNESIUM
Magnesium: 2 mg/dL (ref 1.7–2.4)
Magnesium: 2.1 mg/dL (ref 1.7–2.4)

## 2022-05-04 LAB — GLUCOSE, CAPILLARY
Glucose-Capillary: 128 mg/dL — ABNORMAL HIGH (ref 70–99)
Glucose-Capillary: 130 mg/dL — ABNORMAL HIGH (ref 70–99)
Glucose-Capillary: 138 mg/dL — ABNORMAL HIGH (ref 70–99)
Glucose-Capillary: 139 mg/dL — ABNORMAL HIGH (ref 70–99)
Glucose-Capillary: 145 mg/dL — ABNORMAL HIGH (ref 70–99)
Glucose-Capillary: 156 mg/dL — ABNORMAL HIGH (ref 70–99)

## 2022-05-04 LAB — PHOSPHORUS
Phosphorus: 2.2 mg/dL — ABNORMAL LOW (ref 2.5–4.6)
Phosphorus: 2.4 mg/dL — ABNORMAL LOW (ref 2.5–4.6)

## 2022-05-04 NOTE — Progress Notes (Signed)
Progress Note  Patient: [name] QQP:[id number] DOB: 31-Oct-1941  DOA: 04/25/2022  DOS: 05/04/2022    Brief hospital course: [name] is a 81 y.o. 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 He was admitted for acute on chronic hypoxic respiratory failure with suspicion of acute CHF and/or pneumonia with subsequent CTA chest showing no PE. He was afebrile with leukocytosis and low back pain in addition to his chronic neck pain. Imaging revealed a presacral density, adrenal mass, upper normal sized mediastinal lymph nodes. CT head showed nonspecific 13m hyperdense focus if periventricular parietal white matter for which subsequent MRI was ordered. This was severely motion degraded and limited, but revealed abnormal diffusion signal abnormality layering within the occipital horns of lateral ventricles and Sylvian fissure suggestive of SAH vs. meningitis. Neurology was consulted, performed LP which is consistent with bacterial meningitis. 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  Assessment and Plan:  Acute on chronic hypoxic respiratory failure, improving  Presumably due primarily to aspiration pneumonia,atelectasis/edema. CTA chest without PE.  Continue to wean oxygen - goal O2 sats 90-92% Patient is confirmed to be DNR/DNI. Continue baseline COPD medications and prn albuterol. Encourage frequent incentive spirometry/flutter as tolerated  Bacterial meningitis: Concurrent metabolic encephalopathy/rule out hospital delirium Negative gram stain, though Neutrophils severely elevated with low glucose, high protein on LP 5/14.  - ID consulted - will complete ceftriaxone 2 gram q12hrs on 5/28 - Continue droplet  precautions.  - Household contacts over the past 7 days have been given chemoprophylaxis on 5/14. No further needs in this regard per multiple physicians. -Defer to ID for further imaging needs.   Acute renal failure, resolving:  Secondary to multiple insults including lasix, NPO, repeated contrast dye loads, vancomycin.  DC IVF and increase free water flushes with NG feeds for volume management Cortrak - will transition to PEG Monday unless speech clears for PO; even if cleared for PO will likely need PEG for definitive access to give medications and ensure proper caloric intake.  Acute urinary retention Concurrent hematuria:  Continue Foley catheter, urine output/color improving Urology following, appreciate insight recommendations, continue CBI/flushing per protocol  Severe lumbago, resolved -Likely in the setting of meningitis -Appears to be resolving over the past 24 hours -Continue tylenol, robaxin, heating pad -Dilaudid for breakthrough pain only -he has used these medications sparingly over the past 24 hours.  Acute on chronic HFREF:  LVEF 45-50%, based on more recent echo, LVEF has improved to 50-55%.  Holding lasix - cardiology consulted.  - Continue metoprolol, giving IV scheduled while NPO.  Acute CVA:  Suggested by [id number] signal abnormality involving the posterior left frontotemporal region on limited MRI.  - Ok to start ASA, though made strict NPO due to ongoing silent aspiration.  - Cortrak placed 5/19 - plan to transition to PEG next week if no meaningful improvement in swallowing  CAD s/p CABG:  Troponin initially negative at 14, subsequent evidence of demand ischemia (up to 56) due to sepsis. ACS is not currently suspected.  - Continue aspirin, metoprolol, statin - Cardiology consulted, no changes to management or ischemic work up currently planned.  Multifocal pneumonia:  - Increasing consolidations on CTA coinciding with respiratory deterioration. -  Started abx and will continue as above.  - Monitor blood cultures drawn at admission (unchanged 1 of 4 S. anginosis and other is NGTD x5 days)  Presacral mass:  Indeterminate, incidental. Not rim-enhancing. 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 - Renin and aldosterone pending - Cortisol is 49.2. Metanephrines negative. - Needs PET to delineate this as well as presacral density on CT.   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 Not consistent with seizure. improving  NIDT2DM: HbA1c 6.5%, controlled.  - Continue moderate SSI, hold metformin with contrast exposures.   Obesity: Estimated body mass index is 37.47 kg/m as calculated from the following:   Height as of this encounter: '5\' 6"'$  (1.676 m).   Weight as of this encounter: 105.3 kg.  Subjective: No acute issues or events overnight, more awake alert and appropriate this morning, tolerated NG tube placement quite well, continues to ask for ice chips and sips of water which we discussed would carry high risk for aspiration.  Wife at bedside continues to educate patient on reason for n.p.o. status.  Objective: Vitals:   05/03/22 1700 05/03/22 1949 05/03/22 1958 05/04/22 0348  BP: 100/79 121/64  (!) 110/55  Pulse: 94 99  100  Resp: (!) 25 (!) 25  (!) 23  Temp:  97.9 F (36.6 C)  97.8 F (36.6 C)  TempSrc:  Oral  Oral  SpO2: 97% 98% 99% 98%  Weight:    105.3 kg  Height:       General:  Pleasantly resting in chair, No acute distress. HEENT: Speech quiet.  Core track tube intact left nare Neck:  Without mass or deformity.  Trachea is midline. Lungs: Scant bibasilar rales. Heart:  Regular rate and rhythm.  Without murmurs, rubs, or gallops. Abdomen:  Soft, nontender, nondistended.  Without guarding or rebound. Extremities: Without cyanosis,  clubbing, edema, or obvious deformity. Vascular:  Dorsalis pedis and posterior tibial pulses palpable bilaterally. Skin:  Warm and dry, no erythema, no ulcerations.  Data Personally reviewed: CBC: Recent Labs  Lab 04/29/22 1418 05/01/22 0344 05/02/22 0444 05/03/22 0236 05/04/22 0529  WBC 20.6* 17.5* 14.1* 13.7* 15.4*  NEUTROABS 16.1*  --   --   --   --   HGB 14.8 12.7* 15.3 12.2* 11.7*  HCT 46.7 40.1 50.2 39.7 38.2*  MCV 92.5 93.9 94.7 96.1 93.2  PLT 429* 346 221 313 321    Basic Metabolic Panel: Recent Labs  Lab 04/28/22 0322 04/29/22 0212 04/30/22 1616 05/01/22 0344 05/02/22 0444 05/03/22 0236 05/03/22 1659 05/04/22 0529  NA 141   < > 152* 150* 145 145  --  145  K 3.7   < > 3.6 3.3* 4.1 3.4*  --  3.4*  CL 99   < > 112* 114* 108 111  --  112*  CO2 30   < > '29 28 25 25  '$ --  26  GLUCOSE 213*   < > 123* 182* 175* 139*  --  154*  BUN 27*   < > 74* 57* 41* 26*  --  22  CREATININE 0.92   < > 2.86* 1.60* 1.30* 0.92  --  0.77  CALCIUM 9.7   < > 8.3* 8.4* 8.2* 8.3*  --  8.1*  MG 2.2  --   --   --   --   --  1.9 2.0  PHOS  --   --   --   --   --   --  2.3* 2.2*   < > = values in this interval not displayed.    GFR: Estimated Creatinine Clearance: 86.5 mL/min (by C-G formula based on SCr of 0.77 mg/dL).  HbA1C: No results for input(s): HGBA1C in the last 72 hours.  CBG: Recent Labs  Lab 05/03/22 1155 05/03/22 1635 05/03/22 1955 05/04/22 0007 05/04/22 0357  GLUCAP 154* 152* 150* 128* 156*    Urine analysis:    Component Value Date/Time   COLORURINE AMBER (A) 04/30/2022 0646   APPEARANCEUR CLEAR 04/30/2022 0646   LABSPEC 1.025 04/30/2022 0646   PHURINE 5.0 04/30/2022 0646   GLUCOSEU NEGATIVE 04/30/2022 0646   HGBUR MODERATE (A) 04/30/2022 0646   BILIRUBINUR NEGATIVE 04/30/2022 0646   [place] 04/30/2022 0646   PROTEINUR NEGATIVE 04/30/2022 0646   NITRITE NEGATIVE 04/30/2022 0646   LEUKOCYTESUR NEGATIVE 04/30/2022 0646   Recent Results (from  the past 240 hour(s))  Resp Panel by RT-PCR (Flu A&B, Covid) Nasopharyngeal Swab     Status: None   Collection Time: 04/25/22 11:04 AM   Specimen: Nasopharyngeal Swab; Nasopharyngeal(NP) swabs in vial transport medium  Result Value Ref Range Status   SARS Coronavirus 2 by RT PCR NEGATIVE NEGATIVE Final    Comment: (NOTE) SARS-CoV-2 target nucleic acids are NOT DETECTED.  The SARS-CoV-2 RNA is generally detectable in upper respiratory specimens during the acute phase of infection. The lowest concentration of SARS-CoV-2 viral copies this assay can detect is 138 copies/mL. A negative result does not preclude SARS-Cov-2 infection and should not be used as the sole basis for treatment or other patient management decisions. A negative result may occur with  improper specimen collection/handling, submission of specimen other than nasopharyngeal swab, presence of viral mutation(s) within the areas targeted by this assay, and inadequate number of viral copies(<138 copies/mL). A negative result must be combined with clinical observations, patient history, and epidemiological information. The expected result is Negative.  Fact Sheet for Patients:  EntrepreneurPulse.com.au  Fact Sheet for Healthcare Providers:  IncredibleEmployment.be  This test is no t yet approved or cleared by the [place] FDA and  has been authorized for detection and/or diagnosis of SARS-CoV-2 by FDA under an Emergency Use Authorization (EUA). This EUA will remain  in effect (meaning this test can be used) for the duration of the COVID-19 declaration under Section 564(b)(1) of the Act, 21 U.S.C.section 360bbb-3(b)(1), unless the authorization is terminated  or revoked sooner.       Influenza A by PCR NEGATIVE NEGATIVE Final   Influenza B by PCR NEGATIVE NEGATIVE Final    Comment: (NOTE) The Xpert Xpress SARS-CoV-2/FLU/RSV plus assay is intended as an aid in the diagnosis of  influenza from Nasopharyngeal swab specimens and should not be used as a sole basis for treatment. Nasal washings and aspirates are unacceptable for Xpert Xpress SARS-CoV-2/FLU/RSV testing.  Fact Sheet for Patients: EntrepreneurPulse.com.au  Fact Sheet for Healthcare Providers: IncredibleEmployment.be  This test is not yet approved or cleared by the [place] FDA and has been authorized for detection and/or diagnosis of SARS-CoV-2 by FDA under an Emergency Use Authorization (EUA). This EUA will remain in effect (meaning this test can be used) for the duration of the COVID-19 declaration under Section 564(b)(1) of the Act, 21 U.S.C. section 360bbb-3(b)(1), unless the authorization is terminated or revoked.  Performed at [facility], [address] [place],  [place] [address]   Blood culture (routine x 2)     Status: Abnormal   Collection Time: 04/25/22 12:15 PM   Specimen: Right Antecubital; Blood  Result Value Ref Range Status   Specimen Description   Final    RIGHT ANTECUBITAL Performed at [facility], [address] [place], [place] [address]    Special Requests   Final    BOTTLES DRAWN AEROBIC AND ANAEROBIC Blood Culture adequate volume Performed at [facility], [address] [place], [place] [address]    Culture  Setup Time   Final    GRAM POSITIVE COCCI IN CHAINS ANAEROBIC BOTTLE ONLY CRITICAL RESULT CALLED TO, READ BACK BY AND VERIFIED WITH: PHARMD A. MEYER [id number] '@2111'$  FH    Culture (A)  Final    STREPTOCOCCUS ANGINOSIS THE SIGNIFICANCE OF ISOLATING THIS ORGANISM FROM A SINGLE SET OF BLOOD CULTURES WHEN MULTIPLE SETS ARE DRAWN IS UNCERTAIN. PLEASE NOTIFY THE MICROBIOLOGY DEPARTMENT WITHIN ONE WEEK IF SPECIATION AND SENSITIVITIES ARE REQUIRED. Performed at [facility], [place] [address], [place], [place] [address]    Report Status 04/28/2022 FINAL  Final  Blood Culture ID Panel  (Reflexed)     Status: Abnormal   Collection Time: 04/25/22 12:15 PM  Result Value Ref Range Status   Enterococcus faecalis NOT DETECTED NOT DETECTED Final   Enterococcus Faecium NOT DETECTED NOT DETECTED Final   Listeria monocytogenes NOT DETECTED NOT DETECTED Final   Staphylococcus species NOT DETECTED NOT DETECTED Final   Staphylococcus aureus (BCID) NOT DETECTED NOT DETECTED Final   Staphylococcus epidermidis NOT DETECTED NOT DETECTED Final   Staphylococcus lugdunensis NOT DETECTED NOT DETECTED Final   Streptococcus species DETECTED (A) NOT DETECTED Final    Comment: Not Enterococcus species, Streptococcus agalactiae, Streptococcus pyogenes, or Streptococcus pneumoniae. CRITICAL RESULT CALLED TO, READ BACK BY AND VERIFIED WITH: PHARMD A. FWYOV [id number] '@2111'$  FH    Streptococcus agalactiae NOT DETECTED NOT DETECTED Final   Streptococcus pneumoniae NOT DETECTED NOT DETECTED Final   Streptococcus pyogenes NOT DETECTED NOT DETECTED Final   A.calcoaceticus-baumannii NOT DETECTED NOT DETECTED Final   Bacteroides fragilis NOT DETECTED NOT DETECTED Final   Enterobacterales NOT DETECTED NOT DETECTED Final   Enterobacter cloacae complex NOT DETECTED NOT DETECTED Final   Escherichia coli NOT DETECTED NOT DETECTED Final   Klebsiella aerogenes NOT DETECTED NOT DETECTED Final   Klebsiella oxytoca NOT DETECTED NOT DETECTED Final   Klebsiella pneumoniae NOT DETECTED NOT DETECTED Final   Proteus species NOT DETECTED NOT DETECTED Final   Salmonella species NOT DETECTED NOT DETECTED Final   Serratia marcescens NOT DETECTED NOT DETECTED Final   Haemophilus influenzae NOT DETECTED NOT DETECTED Final   Neisseria meningitidis NOT DETECTED NOT DETECTED Final   Pseudomonas aeruginosa NOT DETECTED NOT DETECTED Final   Stenotrophomonas maltophilia NOT DETECTED NOT DETECTED Final   Candida albicans NOT DETECTED NOT DETECTED Final   Candida auris NOT DETECTED NOT DETECTED Final   Candida glabrata NOT  DETECTED NOT DETECTED Final   Candida krusei NOT DETECTED NOT DETECTED Final   Candida parapsilosis NOT DETECTED NOT DETECTED Final   Candida tropicalis NOT DETECTED NOT DETECTED Final   Cryptococcus neoformans/gattii NOT DETECTED NOT DETECTED Final    Comment: Performed at [facility], [address] [place], [place] [address]  Blood culture (routine x 2)     Status: None   Collection Time: 04/25/22  1:00 PM   Specimen: BLOOD LEFT FOREARM  Result Value  Ref Range Status   Specimen Description   Final    BLOOD LEFT FOREARM Performed at [facility], [address] [place], [place] [address]    Special Requests   Final    BOTTLES DRAWN AEROBIC AND ANAEROBIC Blood Culture adequate volume Performed at [facility], [address] [place], [place] [address]    Culture   Final    NO GROWTH 5 DAYS Performed at [facility], [place] [address], [place], [place] [address]    Report Status 04/30/2022 FINAL  Final  Urine Culture     Status: None   Collection Time: 04/25/22  1:17 PM   Specimen: Urine, Clean Catch  Result Value Ref Range Status   Specimen Description   Final    URINE, CLEAN CATCH Performed at [facility], [address] [place], [place] [address]    Special Requests   Final    NONE Performed at [facility], [address] [place], [place] [address]    Culture   Final    NO GROWTH Performed at [facility], [place] [address], [place], [place] [address]    Report Status 04/26/2022 FINAL  Final  CSF culture w Gram Stain     Status: None   Collection Time: 04/28/22 12:06 PM   Specimen: CSF; Cerebrospinal Fluid  Result Value Ref Range Status   Specimen Description CSF  Final   Special Requests Normal  Final   Gram Stain   Final    WBC PRESENT,BOTH PMN AND MONONUCLEAR NO ORGANISMS SEEN CYTOSPIN SMEAR    Culture   Final    NO GROWTH 3 DAYS Performed at [facility], [place] [address], [place],  [place] [address]    Report Status 05/01/2022 FINAL  Final     DG Abd Portable 1V  Result Date: 05/03/2022 CLINICAL DATA:  Enteric tube placement. EXAM: PORTABLE ABDOMEN - 1 VIEW COMPARISON:  None Available. FINDINGS: The bowel gas pattern is normal. Distal tip of feeding tube is seen in expected position of distal stomach. IMPRESSION: Distal tip of feeding tube is seen in expected position of distal stomach. Electronically Signed   By: Marijo Conception M.D.   On: 05/03/2022 10:00     Family Communication: Spouse and SIL at bedside.  Disposition: Status is: Inpatient Remains inpatient appropriate because: Inpatient management of bacterial meningitis and respiratory failure Planned Discharge Destination: TBD  Holli Humbles DO  05/04/2022 7:24 AM Pager - secure chat

## 2022-05-04 NOTE — Plan of Care (Signed)
  Problem: Clinical Measurements: Goal: Cardiovascular complication will be avoided Outcome: Progressing   Problem: Activity: Goal: Risk for activity intolerance will decrease Outcome: Progressing   Problem: Nutrition: Goal: Adequate nutrition will be maintained Outcome: Progressing   

## 2022-05-04 NOTE — Progress Notes (Signed)
Pt says he is currently comfortable on the [place] at this time and will call if BiPAP is needed.

## 2022-05-05 DIAGNOSIS — J9621 Acute and chronic respiratory failure with hypoxia: Secondary | ICD-10-CM | POA: Diagnosis not present

## 2022-05-05 LAB — BASIC METABOLIC PANEL
Anion gap: 5 (ref 5–15)
BUN: 20 mg/dL (ref 8–23)
CO2: 28 mmol/L (ref 22–32)
Calcium: 8 mg/dL — ABNORMAL LOW (ref 8.9–10.3)
Chloride: 112 mmol/L — ABNORMAL HIGH (ref 98–111)
Creatinine, Ser: 0.76 mg/dL (ref 0.61–1.24)
GFR, Estimated: >60 mL/min (ref >60)
Glucose, Bld: 153 mg/dL — ABNORMAL HIGH (ref 70–99)
Potassium: 3.6 mmol/L (ref 3.5–5.1)
Sodium: 145 mmol/L (ref 135–145)

## 2022-05-05 LAB — CBC
HCT: 37.8 % — ABNORMAL LOW (ref 39.0–52.0)
Hemoglobin: 11.9 g/dL — ABNORMAL LOW (ref 13.0–17.0)
MCH: 29.6 pg (ref 26.0–34.0)
MCHC: 31.5 g/dL (ref 30.0–36.0)
MCV: 94 fL (ref 80.0–100.0)
Platelets: 267 10*3/uL (ref 150–400)
RBC: 4.02 MIL/uL — ABNORMAL LOW (ref 4.22–5.81)
RDW: 13.2 % (ref 11.5–15.5)
WBC: 13.5 10*3/uL — ABNORMAL HIGH (ref 4.0–10.5)
nRBC: 0 % (ref 0.0–0.2)

## 2022-05-05 LAB — PHOSPHORUS
Phosphorus: 2.9 mg/dL (ref 2.5–4.6)
Phosphorus: 2.7 mg/dL (ref 2.5–4.6)

## 2022-05-05 LAB — GLUCOSE, CAPILLARY
Glucose-Capillary: 150 mg/dL — ABNORMAL HIGH (ref 70–99)
Glucose-Capillary: 142 mg/dL — ABNORMAL HIGH (ref 70–99)
Glucose-Capillary: 130 mg/dL — ABNORMAL HIGH (ref 70–99)
Glucose-Capillary: 132 mg/dL — ABNORMAL HIGH (ref 70–99)
Glucose-Capillary: 109 mg/dL — ABNORMAL HIGH (ref 70–99)

## 2022-05-05 LAB — MAGNESIUM
Magnesium: 2 mg/dL (ref 1.7–2.4)
Magnesium: 2.1 mg/dL (ref 1.7–2.4)

## 2022-05-05 LAB — ALDOSTERONE + RENIN ACTIVITY W/ RATIO
ALDO / PRA Ratio: <2.1 (ref 0.0–30.0)
Aldosterone: <1 ng/dL (ref 0.0–30.0)
PRA LC/MS/MS: 0.479 ng/mL/hr (ref 0.167–5.380)

## 2022-05-05 NOTE — Progress Notes (Signed)
Progress Note  Patient: [name] YBW:[id number] DOB: June 04, 1941  DOA: 04/25/2022  DOS: 05/05/2022    Brief hospital course: DAVAN NAWABI is a 81 y.o. 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 He was admitted for acute on chronic hypoxic respiratory failure with suspicion of acute CHF and/or pneumonia with subsequent CTA chest showing no PE. He was afebrile with leukocytosis and low back pain in addition to his chronic neck pain. Imaging revealed a presacral density, adrenal mass, upper normal sized mediastinal lymph nodes. CT head showed nonspecific 27m hyperdense focus if periventricular parietal white matter for which subsequent MRI was ordered. This was severely motion degraded and limited, but revealed abnormal diffusion signal abnormality layering within the occipital horns of lateral ventricles and Sylvian fissure suggestive of SAH vs. meningitis. Neurology was consulted, performed LP which is consistent with bacterial meningitis. 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  Assessment and Plan:  Acute on chronic hypoxic respiratory failure, improving  Presumably due primarily to aspiration pneumonia,atelectasis/edema. CTA chest without PE.  Continue to wean oxygen - goal O2 sats 90-92% Patient is confirmed to be DNR/DNI. Continue baseline COPD medications and prn albuterol. Encourage frequent incentive spirometry/flutter as tolerated  Bacterial meningitis: Concurrent metabolic encephalopathy/rule out hospital delirium Negative gram stain, though Neutrophils severely elevated with low glucose, high protein on LP 5/14.  - ID consulted - will complete ceftriaxone 2 gram q12hrs on 5/28 - Continue droplet  precautions.  - Household contacts over the past 7 days have been given chemoprophylaxis on 5/14. No further needs in this regard per multiple physicians. -Defer to ID for further imaging needs.   Acute renal failure, resolving:  Secondary to multiple insults including lasix, NPO, repeated contrast dye loads, vancomycin.  DC IVF and increase free water flushes with NG feeds for volume management Cortrak - will transition to PEG Monday unless speech clears for PO; even if cleared for PO will likely need PEG for definitive access to give medications and ensure proper caloric intake.  Acute urinary retention, improving Concurrent hematuria, resolving Continue Foley catheter, urine output/color improving Urology following, appreciate insight recommendations, continue CBI/flushing per protocol -currently running clear to light yellow  Severe lumbago, resolved -Likely in the setting of meningitis -Appears to be resolving over the past 24 hours -Continue tylenol, robaxin, heating pad -Dilaudid for breakthrough pain only -he has used these medications sparingly over the past 24 hours.  Acute on chronic HFREF:  LVEF 45-50%, based on more recent echo, LVEF has improved to 50-55%.  Holding lasix - cardiology consulted.  - Continue metoprolol, giving IV scheduled while NPO.  Acute CVA:  Suggested by [id number] signal abnormality involving the posterior left frontotemporal region on limited MRI.  - Ok to start ASA, though made strict NPO due to ongoing silent aspiration.  - Cortrak placed 5/19 - plan to transition to PEG next week if no meaningful improvement in swallowing  CAD s/p CABG:  Troponin initially negative at 14, subsequent evidence of demand ischemia (up to 56) due to sepsis. ACS is not currently suspected.  - Continue aspirin, metoprolol, statin - Cardiology consulted, no changes to management or ischemic work up currently planned.  Multifocal pneumonia:  - Increasing  consolidations  on CTA coinciding with respiratory deterioration. - Started abx and will continue as above.  - Monitor blood cultures drawn at admission (unchanged 1 of 4 S. anginosis and other is NGTD x5 days)  Presacral mass:  Indeterminate, incidental. Not rim-enhancing. 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 - Renin and aldosterone pending - Cortisol is 49.2. Metanephrines negative. - Needs PET to delineate this as well as presacral density on CT.   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 Not consistent with seizure. improving  NIDT2DM: HbA1c 6.5%, controlled.  - Continue moderate SSI, hold metformin with contrast exposures.   Obesity: Estimated body mass index is 37.36 kg/m as calculated from the following:   Height as of this encounter: '5\' 6"'$  (1.676 m).   Weight as of this encounter: 105 kg.  Subjective: No acute issues or events overnight, mental status improving daily denies nausea vomiting diarrhea constipation headache fevers chills or chest pain.  Only complaint from patient is requesting p.o. liquids which we explained were not yet possible due to his ongoing need for tube feeds in the setting of aspiration risk but will continue to advance his diet as possible per speech evaluation.  Objective: Vitals:   05/04/22 2347 05/05/22 0016 05/05/22 0500 05/05/22 0521  BP: 115/89   110/64  Pulse: (!) 104 88  62  Resp: (!) 26 (!) 24  (!) 26  Temp: 99 F (37.2 C)   98 F (36.7 C)  TempSrc:    Oral  SpO2:  100%  97%  Weight:   105 kg   Height:       General:  Pleasantly resting in chair, No acute distress. HEENT: Speech quiet.  Core track tube intact left nare Neck:  Without mass or deformity.  Trachea is midline. Lungs: Scant bibasilar rales. Heart:  Regular rate and rhythm.  Without  murmurs, rubs, or gallops. Abdomen:  Soft, nontender, nondistended.  Without guarding or rebound. Extremities: Without cyanosis, clubbing, edema, or obvious deformity. Vascular:  Dorsalis pedis and posterior tibial pulses palpable bilaterally. Skin:  Warm and dry, no erythema, no ulcerations.  Data Personally reviewed: CBC: Recent Labs  Lab 04/29/22 1418 05/01/22 0344 05/02/22 0444 05/03/22 0236 05/04/22 0529 05/05/22 0615  WBC 20.6* 17.5* 14.1* 13.7* 15.4* 13.5*  NEUTROABS 16.1*  --   --   --   --   --   HGB 14.8 12.7* 15.3 12.2* 11.7* 11.9*  HCT 46.7 40.1 50.2 39.7 38.2* 37.8*  MCV 92.5 93.9 94.7 96.1 93.2 94.0  PLT 429* 346 221 313 284 546    Basic Metabolic Panel: Recent Labs  Lab 05/01/22 0344 05/02/22 0444 05/03/22 0236 05/03/22 1659 05/04/22 0529 05/04/22 1746 05/05/22 0615  NA 150* 145 145  --  145  --  145  K 3.3* 4.1 3.4*  --  3.4*  --  3.6  CL 114* 108 111  --  112*  --  112*  CO2 '28 25 25  '$ --  26  --  28  GLUCOSE 182* 175* 139*  --  154*  --  153*  BUN 57* 41* 26*  --  22  --  20  CREATININE 1.60* 1.30* 0.92  --  0.77  --  0.76  CALCIUM 8.4* 8.2* 8.3*  --  8.1*  --  8.0*  MG  --   --   --  1.9 2.0 2.1 2.0  PHOS  --   --   --  2.3* 2.2* 2.4* 2.9    GFR: Estimated Creatinine Clearance: 86.4 mL/min (by C-G formula based on SCr of 0.76 mg/dL).  HbA1C: No results for input(s): HGBA1C in the last 72 hours.  CBG: Recent Labs  Lab 05/04/22 0740 05/04/22 1155 05/04/22 1648 05/04/22 1933 05/05/22 0107  GLUCAP 138* 145* 139* 130* 150*    Urine analysis:    Component Value Date/Time   COLORURINE AMBER (A) 04/30/2022 0646   APPEARANCEUR CLEAR 04/30/2022 0646   LABSPEC 1.025 04/30/2022 0646   PHURINE 5.0 04/30/2022 0646   GLUCOSEU NEGATIVE 04/30/2022 0646   HGBUR MODERATE (A) 04/30/2022 0646   BILIRUBINUR NEGATIVE 04/30/2022 0646   [place] 04/30/2022 0646   PROTEINUR NEGATIVE 04/30/2022 0646   NITRITE NEGATIVE 04/30/2022 0646    LEUKOCYTESUR NEGATIVE 04/30/2022 0646   Recent Results (from the past 240 hour(s))  Resp Panel by RT-PCR (Flu A&B, Covid) Nasopharyngeal Swab     Status: None   Collection Time: 04/25/22 11:04 AM   Specimen: Nasopharyngeal Swab; Nasopharyngeal(NP) swabs in vial transport medium  Result Value Ref Range Status   SARS Coronavirus 2 by RT PCR NEGATIVE NEGATIVE Final    Comment: (NOTE) SARS-CoV-2 target nucleic acids are NOT DETECTED.  The SARS-CoV-2 RNA is generally detectable in upper respiratory specimens during the acute phase of infection. The lowest concentration of SARS-CoV-2 viral copies this assay can detect is 138 copies/mL. A negative result does not preclude SARS-Cov-2 infection and should not be used as the sole basis for treatment or other patient management decisions. A negative result may occur with  improper specimen collection/handling, submission of specimen other than nasopharyngeal swab, presence of viral mutation(s) within the areas targeted by this assay, and inadequate number of viral copies(<138 copies/mL). A negative result must be combined with clinical observations, patient history, and epidemiological information. The expected result is Negative.  Fact Sheet for Patients:  EntrepreneurPulse.com.au  Fact Sheet for Healthcare Providers:  IncredibleEmployment.be  This test is no t yet approved or cleared by the [place] FDA and  has been authorized for detection and/or diagnosis of SARS-CoV-2 by FDA under an Emergency Use Authorization (EUA). This EUA will remain  in effect (meaning this test can be used) for the duration of the COVID-19 declaration under Section 564(b)(1) of the Act, 21 U.S.C.section 360bbb-3(b)(1), unless the authorization is terminated  or revoked sooner.       Influenza A by PCR NEGATIVE NEGATIVE Final   Influenza B by PCR NEGATIVE NEGATIVE Final    Comment: (NOTE) The Xpert Xpress  SARS-CoV-2/FLU/RSV plus assay is intended as an aid in the diagnosis of influenza from Nasopharyngeal swab specimens and should not be used as a sole basis for treatment. Nasal washings and aspirates are unacceptable for Xpert Xpress SARS-CoV-2/FLU/RSV testing.  Fact Sheet for Patients: EntrepreneurPulse.com.au  Fact Sheet for Healthcare Providers: IncredibleEmployment.be  This test is not yet approved or cleared by the [place] FDA and has been authorized for detection and/or diagnosis of SARS-CoV-2 by FDA under an Emergency Use Authorization (EUA). This EUA will remain in effect (meaning this test can be used) for the duration of the COVID-19 declaration under Section 564(b)(1) of the Act, 21 U.S.C. section 360bbb-3(b)(1), unless the authorization is terminated or revoked.  Performed at [facility], [address] [place], [place] [address]   Blood culture (  routine x 2)     Status: Abnormal   Collection Time: 04/25/22 12:15 PM   Specimen: Right Antecubital; Blood  Result Value Ref Range Status   Specimen Description   Final    RIGHT ANTECUBITAL Performed at [facility], [address] [place], [place] [address]    Special Requests   Final    BOTTLES DRAWN AEROBIC AND ANAEROBIC Blood Culture adequate volume Performed at [facility], [address] [place], [place] [address]    Culture  Setup Time   Final    GRAM POSITIVE COCCI IN CHAINS ANAEROBIC BOTTLE ONLY CRITICAL RESULT CALLED TO, READ BACK BY AND VERIFIED WITH: PHARMD A. MEYER [id number] '@2111'$  FH    Culture (A)  Final    STREPTOCOCCUS ANGINOSIS THE SIGNIFICANCE OF ISOLATING THIS ORGANISM FROM A SINGLE SET OF BLOOD CULTURES WHEN MULTIPLE SETS ARE DRAWN IS UNCERTAIN. PLEASE NOTIFY THE MICROBIOLOGY DEPARTMENT WITHIN ONE WEEK IF SPECIATION AND SENSITIVITIES ARE REQUIRED. Performed at [facility], [place] [address] [place], [place] [address]     Report Status 04/28/2022 FINAL  Final  Blood Culture ID Panel (Reflexed)     Status: Abnormal   Collection Time: 04/25/22 12:15 PM  Result Value Ref Range Status   Enterococcus faecalis NOT DETECTED NOT DETECTED Final   Enterococcus Faecium NOT DETECTED NOT DETECTED Final   Listeria monocytogenes NOT DETECTED NOT DETECTED Final   Staphylococcus species NOT DETECTED NOT DETECTED Final   Staphylococcus aureus (BCID) NOT DETECTED NOT DETECTED Final   Staphylococcus epidermidis NOT DETECTED NOT DETECTED Final   Staphylococcus lugdunensis NOT DETECTED NOT DETECTED Final   Streptococcus species DETECTED (A) NOT DETECTED Final    Comment: Not Enterococcus species, Streptococcus agalactiae, Streptococcus pyogenes, or Streptococcus pneumoniae. CRITICAL RESULT CALLED TO, READ BACK BY AND VERIFIED WITH: PHARMD A. YOVZC [id number] '@2111'$  FH    Streptococcus agalactiae NOT DETECTED NOT DETECTED Final   Streptococcus pneumoniae NOT DETECTED NOT DETECTED Final   Streptococcus pyogenes NOT DETECTED NOT DETECTED Final   A.calcoaceticus-baumannii NOT DETECTED NOT DETECTED Final   Bacteroides fragilis NOT DETECTED NOT DETECTED Final   Enterobacterales NOT DETECTED NOT DETECTED Final   Enterobacter cloacae complex NOT DETECTED NOT DETECTED Final   Escherichia coli NOT DETECTED NOT DETECTED Final   Klebsiella aerogenes NOT DETECTED NOT DETECTED Final   Klebsiella oxytoca NOT DETECTED NOT DETECTED Final   Klebsiella pneumoniae NOT DETECTED NOT DETECTED Final   Proteus species NOT DETECTED NOT DETECTED Final   Salmonella species NOT DETECTED NOT DETECTED Final   Serratia marcescens NOT DETECTED NOT DETECTED Final   Haemophilus influenzae NOT DETECTED NOT DETECTED Final   Neisseria meningitidis NOT DETECTED NOT DETECTED Final   Pseudomonas aeruginosa NOT DETECTED NOT DETECTED Final   Stenotrophomonas maltophilia NOT DETECTED NOT DETECTED Final   Candida albicans NOT DETECTED NOT DETECTED Final   Candida  auris NOT DETECTED NOT DETECTED Final   Candida glabrata NOT DETECTED NOT DETECTED Final   Candida krusei NOT DETECTED NOT DETECTED Final   Candida parapsilosis NOT DETECTED NOT DETECTED Final   Candida tropicalis NOT DETECTED NOT DETECTED Final   Cryptococcus neoformans/gattii NOT DETECTED NOT DETECTED Final    Comment: Performed at [facility], [address] [place], [place] [address]  Blood culture (routine x 2)     Status: None   Collection Time: 04/25/22  1:00 PM   Specimen: BLOOD LEFT FOREARM  Result Value Ref Range Status   Specimen  Description   Final    BLOOD LEFT FOREARM Performed at [facility], [address] [place], [place] [address]    Special Requests   Final    BOTTLES DRAWN AEROBIC AND ANAEROBIC Blood Culture adequate volume Performed at [facility], [address] [place], [place] [address]    Culture   Final    NO GROWTH 5 DAYS Performed at [facility], [place] [address], [place], [place] [address]    Report Status 04/30/2022 FINAL  Final  Urine Culture     Status: None   Collection Time: 04/25/22  1:17 PM   Specimen: Urine, Clean Catch  Result Value Ref Range Status   Specimen Description   Final    URINE, CLEAN CATCH Performed at [facility], [address] [place], [place] [address]    Special Requests   Final    NONE Performed at [facility], [address] [place], [place] [address]    Culture   Final    NO GROWTH Performed at [facility], [place] [address], [place], [place] [address]    Report Status 04/26/2022 FINAL  Final  CSF culture w Gram Stain     Status: None   Collection Time: 04/28/22 12:06 PM   Specimen: CSF; Cerebrospinal Fluid  Result Value Ref Range Status   Specimen Description CSF  Final   Special Requests Normal  Final   Gram Stain   Final    WBC PRESENT,BOTH PMN AND MONONUCLEAR NO ORGANISMS SEEN CYTOSPIN SMEAR    Culture   Final    NO GROWTH 3  DAYS Performed at [facility], [place] [address] [place], [place] [address]    Report Status 05/01/2022 FINAL  Final     DG Abd Portable 1V  Result Date: 05/03/2022 CLINICAL DATA:  Enteric tube placement. EXAM: PORTABLE ABDOMEN - 1 VIEW COMPARISON:  None Available. FINDINGS: The bowel gas pattern is normal. Distal tip of feeding tube is seen in expected position of distal stomach. IMPRESSION: Distal tip of feeding tube is seen in expected position of distal stomach. Electronically Signed   By: Marijo Conception M.D.   On: 05/03/2022 10:00     Family Communication: Spouse and SIL at bedside.  Disposition: Status is: Inpatient Remains inpatient appropriate because: Inpatient management of bacterial meningitis and respiratory failure Planned Discharge Destination: TBD  Holli Humbles DO  05/05/2022 7:36 AM Pager - secure chat

## 2022-05-06 DIAGNOSIS — J9621 Acute and chronic respiratory failure with hypoxia: Secondary | ICD-10-CM | POA: Diagnosis not present

## 2022-05-06 LAB — GLUCOSE, CAPILLARY
Glucose-Capillary: 125 mg/dL — ABNORMAL HIGH (ref 70–99)
Glucose-Capillary: 129 mg/dL — ABNORMAL HIGH (ref 70–99)
Glucose-Capillary: 132 mg/dL — ABNORMAL HIGH (ref 70–99)
Glucose-Capillary: 133 mg/dL — ABNORMAL HIGH (ref 70–99)
Glucose-Capillary: 137 mg/dL — ABNORMAL HIGH (ref 70–99)
Glucose-Capillary: 143 mg/dL — ABNORMAL HIGH (ref 70–99)

## 2022-05-06 LAB — MAGNESIUM: Magnesium: 2 mg/dL (ref 1.7–2.4)

## 2022-05-06 LAB — BASIC METABOLIC PANEL
Anion gap: 4 — ABNORMAL LOW (ref 5–15)
BUN: 19 mg/dL (ref 8–23)
CO2: 27 mmol/L (ref 22–32)
Calcium: 8.2 mg/dL — ABNORMAL LOW (ref 8.9–10.3)
Chloride: 115 mmol/L — ABNORMAL HIGH (ref 98–111)
Creatinine, Ser: 0.72 mg/dL (ref 0.61–1.24)
GFR, Estimated: >60 mL/min (ref >60)
Glucose, Bld: 146 mg/dL — ABNORMAL HIGH (ref 70–99)
Potassium: 4.2 mmol/L (ref 3.5–5.1)
Sodium: 146 mmol/L — ABNORMAL HIGH (ref 135–145)

## 2022-05-06 LAB — CBC
HCT: 36 % — ABNORMAL LOW (ref 39.0–52.0)
Hemoglobin: 11.2 g/dL — ABNORMAL LOW (ref 13.0–17.0)
MCH: 29.6 pg (ref 26.0–34.0)
MCHC: 31.1 g/dL (ref 30.0–36.0)
MCV: 95 fL (ref 80.0–100.0)
Platelets: 280 10*3/uL (ref 150–400)
RBC: 3.79 MIL/uL — ABNORMAL LOW (ref 4.22–5.81)
RDW: 13.4 % (ref 11.5–15.5)
WBC: 12.6 10*3/uL — ABNORMAL HIGH (ref 4.0–10.5)
nRBC: 0 % (ref 0.0–0.2)

## 2022-05-06 LAB — PHOSPHORUS: Phosphorus: 2.8 mg/dL (ref 2.5–4.6)

## 2022-05-06 MED ORDER — CEFAZOLIN SODIUM-DEXTROSE 2-4 GM/100ML-% IV SOLN
2.0000 g | INTRAVENOUS | Status: AC
  Administered 2022-05-07: 2 g via INTRAVENOUS
  Filled 2022-05-06: qty 100

## 2022-05-06 MED ORDER — IOHEXOL 180 MG/ML  SOLN
75.0000 mL | Freq: Once | INTRAMUSCULAR | Status: AC
  Administered 2022-05-06: 75 mL via ORAL

## 2022-05-06 NOTE — Plan of Care (Signed)

## 2022-05-06 NOTE — Care Management Important Message (Signed)
Important Message  Patient Details  Name: [name] MRN: [id number] Date of Birth: 29-Mar-1941   Medicare Important Message Given:  Yes     [name] 05/06/2022, 8:27 AM

## 2022-05-06 NOTE — Progress Notes (Signed)
Inpatient Rehabilitation Admissions Coordinator   I await [facility] medicare determination for a possible Cir admit. Noted plans for PEG.  Danne Baxter, RN, MSN Rehab Admissions Coordinator [phone] 05/06/2022 4:19 PM

## 2022-05-06 NOTE — Progress Notes (Addendum)
Progress Note  Patient: [name] WLS:[id number] DOB: 1940-12-25  DOA: 04/25/2022  DOS: 05/06/2022    Brief hospital course: THOMA PAULSEN is a 81 y.o. 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 He was admitted for acute on chronic hypoxic respiratory failure with suspicion of acute CHF and/or pneumonia with subsequent CTA chest showing no PE. He was afebrile with leukocytosis and low back pain in addition to his chronic neck pain. Imaging revealed a presacral density, adrenal mass, upper normal sized mediastinal lymph nodes. CT head showed nonspecific 41m hyperdense focus if periventricular parietal white matter for which subsequent MRI was ordered. This was severely motion degraded and limited, but revealed abnormal diffusion signal abnormality layering within the occipital horns of lateral ventricles and Sylvian fissure suggestive of SAH vs. meningitis. Neurology was consulted, performed LP which is consistent with bacterial meningitis. 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  Assessment and Plan:  Acute on chronic hypoxic respiratory failure, improving  Presumably due primarily to aspiration pneumonia,atelectasis/edema. CTA chest without PE.  Continue to wean oxygen - goal O2 sats 90-92% Patient is confirmed to be DNR/DNI. Continue baseline COPD medications and prn albuterol. Encourage frequent incentive spirometry/flutter as tolerated  Bacterial meningitis; confirmed H influenza, POA: Concurrent metabolic encephalopathy/rule out hospital delirium Negative gram stain, though Neutrophils severely elevated with low glucose, high protein on LP 5/14.  - ID consulted - will complete ceftriaxone 2 gram q12hrs on  5/28 - Continue droplet precautions.  - Household contacts over the past 7 days have been given chemoprophylaxis on 5/14. No further needs in this regard per multiple physicians  Dysphagia in the setting of recent CVA and vocal cord dysfunction  -Cortrak in place- will transition to PEG Tuesday - continues to have difficulty with swallowing per SLP. PEG for definitive access to give medications and ensure proper caloric/free water intake. -Scheduled contrast through core track tonight at 6 PM for imaging per IR discussion  Acute renal failure, resolving:  Secondary to multiple insults including lasix, NPO, repeated contrast dye loads, vancomycin.  DC IVF and increase free water flushes with NG feeds for volume management  Acute urinary retention, improving Concurrent hematuria, resolving Continue Foley catheter, urine output/color improving Urology following, appreciate insight recommendations, continue CBI/flushing per protocol -currently running clear to light yellow  Severe lumbago, resolved -Likely in the setting of meningitis -Continue tylenol, robaxin, heating pad -Dilaudid for breakthrough pain only  Acute on chronic HFREF -LVEF 45-50%, based on more recent echo, LVEF has improved to 50-55%.  Holding lasix - cardiology consulted.  - Continue metoprolol, giving IV scheduled while NPO.  Acute CVA:  Suggested by [id number] signal abnormality involving the posterior left frontotemporal region on limited MRI.  - Ok to start ASA, though made strict NPO due to ongoing silent aspiration.  - Cortrak placed 5/19 - plan to transition to PEG next week if no meaningful improvement in swallowing  CAD s/p CABG:  Troponin initially negative at 14, subsequent evidence of demand ischemia (up to 56) due to sepsis. ACS is not currently suspected.  - Continue aspirin, metoprolol, statin - Cardiology consulted, no changes to management or ischemic work up currently planned.  Multifocal  pneumonia:  - Increasing consolidations  on CTA coinciding with respiratory deterioration. - Started abx and will continue as above.  - Monitor blood cultures drawn at admission (unchanged 1 of 4 S. anginosis and other is NGTD x5 days)  Presacral mass:  Indeterminate, incidental. Not rim-enhancing. 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 - Renin and aldosterone pending - Cortisol is 49.2. Metanephrines negative. - Needs PET to delineate this as well as presacral density on CT.   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 Not consistent with seizure. improving  NIDT2DM: HbA1c 6.5%, controlled.  - Continue moderate SSI, hold metformin with contrast exposures.   Obesity: Estimated body mass index is 37.01 kg/m as calculated from the following:   Height as of this encounter: '5\' 6"'$  (1.676 m).   Weight as of this encounter: 104 kg.  Subjective: No acute issues or events overnight, mental status improving daily denies nausea vomiting diarrhea constipation headache fevers chills or chest pain.  Only complaint from patient is requesting p.o. liquids which we explained were not yet possible due to his ongoing need for tube feeds in the setting of aspiration risk but will continue to advance his diet as possible per speech evaluation.  Objective: Vitals:   05/05/22 2048 05/05/22 2103 05/06/22 0016 05/06/22 0419  BP:  (!) 109/55 119/64 106/62  Pulse:  93 98 92  Resp:  '20 17 20  '$ Temp:  97.6 F (36.4 C) 97.8 F (36.6 C) 97.6 F (36.4 C)  TempSrc:  Oral Oral Oral  SpO2: 99% 95% 93% 94%  Weight:    104 kg  Height:       General:  Pleasantly resting in chair, No acute distress. HEENT: Speech quiet.  Core track tube intact left nare Neck:  Without mass or deformity.  Trachea is midline. Lungs: Scant bibasilar  rales. Heart:  Regular rate and rhythm.  Without murmurs, rubs, or gallops. Abdomen:  Soft, nontender, nondistended.  Without guarding or rebound. Extremities: Without cyanosis, clubbing, edema, or obvious deformity. Vascular:  Dorsalis pedis and posterior tibial pulses palpable bilaterally. Skin:  Warm and dry, no erythema, no ulcerations.  Data Personally reviewed: CBC: Recent Labs  Lab 04/29/22 1418 05/01/22 0344 05/02/22 0444 05/03/22 0236 05/04/22 0529 05/05/22 0615  WBC 20.6* 17.5* 14.1* 13.7* 15.4* 13.5*  NEUTROABS 16.1*  --   --   --   --   --   HGB 14.8 12.7* 15.3 12.2* 11.7* 11.9*  HCT 46.7 40.1 50.2 39.7 38.2* 37.8*  MCV 92.5 93.9 94.7 96.1 93.2 94.0  PLT 429* 346 221 313 284 737    Basic Metabolic Panel: Recent Labs  Lab 05/01/22 0344 05/02/22 0444 05/03/22 0236 05/03/22 1659 05/04/22 0529 05/04/22 1746 05/05/22 0615 05/05/22 1722  NA 150* 145 145  --  145  --  145  --   K 3.3* 4.1 3.4*  --  3.4*  --  3.6  --   CL 114* 108 111  --  112*  --  112*  --   CO2 '28 25 25  '$ --  26  --  28  --   GLUCOSE 182* 175* 139*  --  154*  --  153*  --   BUN 57* 41* 26*  --  22  --  20  --   CREATININE 1.60* 1.30* 0.92  --  0.77  --  0.76  --   CALCIUM 8.4* 8.2* 8.3*  --  8.1*  --  8.0*  --   MG  --   --   --  1.9 2.0 2.1 2.0 2.1  PHOS  --   --   --  2.3* 2.2* 2.4* 2.9 2.7    GFR: Estimated Creatinine Clearance: 86 mL/min (by C-G formula based on SCr of 0.76 mg/dL).  HbA1C: No results for input(s): HGBA1C in the last 72 hours.  CBG: Recent Labs  Lab 05/05/22 1141 05/05/22 1631 05/05/22 2100 05/06/22 0013 05/06/22 0415  GLUCAP 130* 132* 109* 132* 133*    Urine analysis:    Component Value Date/Time   COLORURINE AMBER (A) 04/30/2022 0646   APPEARANCEUR CLEAR 04/30/2022 0646   LABSPEC 1.025 04/30/2022 0646   PHURINE 5.0 04/30/2022 0646   GLUCOSEU NEGATIVE 04/30/2022 0646   HGBUR MODERATE (A) 04/30/2022 0646   BILIRUBINUR NEGATIVE 04/30/2022 0646    [place] 04/30/2022 0646   PROTEINUR NEGATIVE 04/30/2022 0646   NITRITE NEGATIVE 04/30/2022 0646   LEUKOCYTESUR NEGATIVE 04/30/2022 0646   Recent Results (from the past 240 hour(s))  CSF culture w Gram Stain     Status: None   Collection Time: 04/28/22 12:06 PM   Specimen: CSF; Cerebrospinal Fluid  Result Value Ref Range Status   Specimen Description CSF  Final   Special Requests Normal  Final   Gram Stain   Final    WBC PRESENT,BOTH PMN AND MONONUCLEAR NO ORGANISMS SEEN CYTOSPIN SMEAR    Culture   Final    NO GROWTH 3 DAYS Performed at [facility], [place] [address] [place], [place] [address]    Report Status 05/01/2022 FINAL  Final     No results found.   Family Communication: Spouse and SIL at bedside.  Disposition: Status is: Inpatient Remains inpatient appropriate because: Inpatient management of bacterial meningitis and respiratory failure Planned Discharge Destination: TBD  Holli Humbles DO  05/06/2022 7:26 AM Pager - secure chat

## 2022-05-06 NOTE — Progress Notes (Signed)
SLP Cancellation Note  Patient Details Name: [name] MRN: [id number] DOB: May 10, 1941   Cancelled treatment:       Reason Eval/Treat Not Completed: Other (comment) Attempted to see pt for dysphagia tx but he is needing to use the commode, getting help from NT at the moment. Will f/u as able.    Osie Bond., M.A. [place] Office [phone]  Secure chat preferred  05/06/2022, 10:43 AM

## 2022-05-06 NOTE — Progress Notes (Signed)
Speech Language Pathology Treatment: Dysphagia  Patient Details Name: [name] MRN: [id number] DOB: 02/28/1941 Today's Date: 05/06/2022 Time: 1339-1357 SLP Time Calculation (min) (ACUTE ONLY): 18 min  Assessment / Plan / Recommendation Clinical Impression  Pt's mentation seems to be improved, but he remains completely aphonic despite cues for easy onsets, attempts at building subglottic pressure, and use of head turns considering unilateral involvement per ENT note. Pt was given ice chips and a small amount of water with coughing associated with water. This is consistent with clinical presentation prior to previous MBS, during which there was primarily silent aspiration. This makes it more challenging to assess at bedside, although given that pt continues to show signs of inadequate adduction, suspect that he likely still has a significant dysphagia with reduced ability to protect his airway. What pt does have going for him now is that he would be able to potentially try more strategies to compensate for dysphagia; however, with cues for head turns he has limited ROM of his neck. Even if strategies might provide some level of compensation, suspect that he will ultimately need help for his paralyzed vocal fold. Discussed with pt and MD: will continue to follow for dysphagia and aphonia but will hold on repeating MBS at this time.   HPI HPI: 80 y.o. male adm 5/11 with edema and orthopnea with acute on chronic CHF and COPD. Pt also with acute onset dysphonia. 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 MRI 5/13 limited but with concern for CNS infection and possible small acute infarct in the L frontotemporal region. LP  5/14 + for bacterial meningitis. PMhx: COPD on O2, HFrEF, DM2, CAD s/p CABG, depression, macular degeneration, HTN, HLD. LP consistent with meningitis; per ENT = Left true vocal fold moves normally. RIGHT VF fixed in paramedian position with  no apparent movement. Concern for possible acute infarct on MRI, ? SAH on CT.      SLP Plan  Continue with current plan of care      Recommendations for follow up therapy are one component of a multi-disciplinary discharge planning process, led by the attending physician.  Recommendations may be updated based on patient status, additional functional criteria and insurance authorization.    Recommendations  Diet recommendations: NPO Medication Administration: Via alternative means                Oral Care Recommendations: Oral care QID Follow Up Recommendations: Acute inpatient rehab (3hours/day) Assistance recommended at discharge: Intermittent Supervision/Assistance SLP Visit Diagnosis: Dysphagia, pharyngoesophageal phase (R13.14);Dysphagia, oropharyngeal phase (R13.12);Aphonia (R49.1) Plan: Continue with current plan of care           Laura N., M.A. CCC-SLP Acute Rehabilitation Services Office [phone]  Secure chat preferred   05/06/2022, 2:58 PM 

## 2022-05-06 NOTE — Progress Notes (Signed)
Physical Therapy Treatment Patient Details Name: [name] MRN: [id number] DOB: November 12, 1941 Today's Date: 05/06/2022   History of Present Illness 81 y.o. male adm 5/11 with edema and orthopnea with acute on chronic CHF and COPD. 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 5/13 MRI with small SAH vs meningitis. 5/14 respiratory distress and LP with bacterial meningitis. PMhx: COPD on O2, HFrEF, DM2, CAD s/p CABG, depression, macular degeneration, HTN, HLD    PT Comments    Pt very pleasant with significantly improved cognition and mobility. Pt oriented x 4 and able to follow all single step commands with delay at times. Pt able to progress gait and walked to bathroom for BM. AIR remains appropriate and pt educated for continued assist needed for mobility.      Recommendations for follow up therapy are one component of a multi-disciplinary discharge planning process, led by the attending physician.  Recommendations may be updated based on patient status, additional functional criteria and insurance authorization.  Follow Up Recommendations  Acute inpatient rehab (3hours/day)     Assistance Recommended at Discharge Frequent or constant Supervision/Assistance  Patient can return home with the following Assistance with cooking/housework;Assist for transportation;A lot of help with walking and/or transfers;Help with stairs or ramp for entrance;A lot of help with bathing/dressing/bathroom   Equipment Recommendations  Rolling walker (2 wheels);BSC/3in1    Recommendations for Other Services       Precautions / Restrictions Precautions Precautions: Fall;Other (comment) Precaution Comments: watch HR and sats, foley with irrigation, cortrak     Mobility  Bed Mobility Overal bed mobility: Needs Assistance Bed Mobility: Supine to Sit     Supine to sit: Min assist, HOB elevated     General bed mobility comments: HOB 30 degrees with min  assist to elevate trunk    Transfers Overall transfer level: Needs assistance   Transfers: Sit to/from Stand Sit to Stand: Min guard           General transfer comment: cues for hand placement and safety with elevated surface at bed and BSC    Ambulation/Gait Ambulation/Gait assistance: Min assist Gait Distance (Feet): 50 Feet Assistive device: Rolling walker (2 wheels) Gait Pattern/deviations: Step-through pattern, Decreased stride length, Trunk flexed   Gait velocity interpretation: <1.8 ft/sec, indicate of risk for recurrent falls   General Gait Details: min cues for proximity to RW and posture, pt walked 20' then 81' with seated rest on 8L for gait with poor pleth and unable to accurately obtain reading with sats 95% on 5L at rest   Stairs             Wheelchair Mobility    Modified Rankin (Stroke Patients Only)       Balance Overall balance assessment: Needs assistance   Sitting balance-Leahy Scale: Fair Sitting balance - Comments: static sitting without assist at bed and toilet   Standing balance support: Reliant on assistive device for balance, During functional activity Standing balance-Leahy Scale: Poor Standing balance comment: bil UE support on RW                            Cognition Arousal/Alertness: Awake/alert Behavior During Therapy: WFL for tasks assessed/performed Overall Cognitive Status: Impaired/Different from baseline                               Problem Solving: Slow  processing General Comments: pt following all commands with delay at times        Exercises General Exercises - Lower Extremity Long Arc Quad: Both, Seated, AROM, 15 reps Hip Flexion/Marching: Both, Seated, 15 reps, AROM    General Comments        Pertinent Vitals/Pain      Home Living                          Prior Function            PT Goals (current goals can now be found in the care plan section) Progress  towards PT goals: Progressing toward goals    Frequency    Min 3X/week      PT Plan Current plan remains appropriate    Co-evaluation              AM-PAC PT "6 Clicks" Mobility   Outcome Measure  Help needed turning from your back to your side while in a flat bed without using bedrails?: A Little Help needed moving from lying on your back to sitting on the side of a flat bed without using bedrails?: A Little Help needed moving to and from a bed to a chair (including a wheelchair)?: A Little Help needed standing up from a chair using your arms (e.g., wheelchair or bedside chair)?: A Little Help needed to walk in hospital room?: A Lot Help needed climbing 3-5 steps with a railing? : Total 6 Click Score: 15    End of Session Equipment Utilized During Treatment: Gait belt;Oxygen Activity Tolerance: Patient tolerated treatment well Patient left: with call bell/phone within reach;in chair;with chair alarm set Nurse Communication: Mobility status PT Visit Diagnosis: Other abnormalities of gait and mobility (R26.89);Difficulty in walking, not elsewhere classified (R26.2);Muscle weakness (generalized) (M62.81);Pain     Time: 0825-0900 PT Time Calculation (min) (ACUTE ONLY): 35 min  Charges:  $Gait Training: 8-22 mins $Therapeutic Activity: 8-22 mins                     Jamiesha Victoria P, PT Acute Rehabilitation Services Pager: [phone] Office: [place] 05/06/2022, 9:44 AM

## 2022-05-07 ENCOUNTER — Inpatient Hospital Stay (HOSPITAL_COMMUNITY): Payer: Medicare Other

## 2022-05-07 DIAGNOSIS — J9621 Acute and chronic respiratory failure with hypoxia: Secondary | ICD-10-CM | POA: Diagnosis not present

## 2022-05-07 HISTORY — PX: IR GASTROSTOMY TUBE MOD SED: IMG625

## 2022-05-07 LAB — GLUCOSE, CAPILLARY
Glucose-Capillary: 119 mg/dL — ABNORMAL HIGH (ref 70–99)
Glucose-Capillary: 119 mg/dL — ABNORMAL HIGH (ref 70–99)
Glucose-Capillary: 133 mg/dL — ABNORMAL HIGH (ref 70–99)
Glucose-Capillary: 139 mg/dL — ABNORMAL HIGH (ref 70–99)
Glucose-Capillary: 84 mg/dL (ref 70–99)

## 2022-05-07 LAB — CBC
HCT: 36 % — ABNORMAL LOW (ref 39.0–52.0)
Hemoglobin: 11 g/dL — ABNORMAL LOW (ref 13.0–17.0)
MCH: 29 pg (ref 26.0–34.0)
MCHC: 30.6 g/dL (ref 30.0–36.0)
MCV: 95 fL (ref 80.0–100.0)
Platelets: 213 10*3/uL (ref 150–400)
RBC: 3.79 MIL/uL — ABNORMAL LOW (ref 4.22–5.81)
RDW: 13.6 % (ref 11.5–15.5)
WBC: 11.3 10*3/uL — ABNORMAL HIGH (ref 4.0–10.5)
nRBC: 0 % (ref 0.0–0.2)

## 2022-05-07 LAB — PROTIME-INR
INR: 1 (ref 0.8–1.2)
Prothrombin Time: 13.1 seconds (ref 11.4–15.2)

## 2022-05-07 MED ORDER — LIDOCAINE HCL 1 % IJ SOLN
INTRAMUSCULAR | Status: AC
  Filled 2022-05-07: qty 20

## 2022-05-07 MED ORDER — TAMSULOSIN HCL 0.4 MG PO CAPS: 0.4000 mg | ORAL_CAPSULE | Freq: Every day | ORAL | Status: DC

## 2022-05-07 MED ORDER — ACETAMINOPHEN 160 MG/5ML PO SOLN
650.0000 mg | ORAL | Status: DC | PRN
  Administered 2022-05-08: 650 mg
  Filled 2022-05-07: qty 20.3

## 2022-05-07 MED ORDER — MIDAZOLAM HCL 2 MG/2ML IJ SOLN
INTRAMUSCULAR | Status: AC
  Filled 2022-05-07: qty 4

## 2022-05-07 MED ORDER — MIDAZOLAM HCL 2 MG/2ML IJ SOLN
INTRAMUSCULAR | Status: AC | PRN
  Administered 2022-05-07: 1 mg via INTRAVENOUS

## 2022-05-07 MED ORDER — GLUCAGON HCL RDNA (DIAGNOSTIC) 1 MG IJ SOLR
INTRAMUSCULAR | Status: AC
  Filled 2022-05-07: qty 1

## 2022-05-07 MED ORDER — FENTANYL CITRATE (PF) 100 MCG/2ML IJ SOLN
INTRAMUSCULAR | Status: AC
  Filled 2022-05-07: qty 4

## 2022-05-07 MED ORDER — TAMSULOSIN HCL 0.4 MG PO CAPS
0.4000 mg | ORAL_CAPSULE | Freq: Every day | ORAL | Status: DC
  Filled 2022-05-07: qty 1

## 2022-05-07 MED ORDER — FENTANYL CITRATE (PF) 100 MCG/2ML IJ SOLN
INTRAMUSCULAR | Status: AC | PRN
  Administered 2022-05-07: 25 ug via INTRAVENOUS

## 2022-05-07 MED ORDER — GLUCAGON HCL (RDNA) 1 MG IJ SOLR
INTRAMUSCULAR | Status: AC | PRN
  Administered 2022-05-07: 1 mg via INTRAVENOUS

## 2022-05-07 NOTE — Progress Notes (Signed)
Occupational Therapy Treatment Patient Details Name: [name] MRN: [id number] DOB: [birth year] Today's Date: 05/07/2022   History of present illness 81 y.o. male adm 5/11 with edema and orthopnea with acute on chronic CHF and COPD. 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 5/13 MRI with small SAH vs meningitis. 5/14 respiratory distress and LP with bacterial meningitis. PMhx: COPD on O2, HFrEF, DM2, CAD s/p CABG, depression, macular degeneration, HTN, HLD   OT comments  Pt seen for brief OT ADL retraining session today with focus on functional mobility and transfers, toileting/transfers/hygiene and self care following BM. Pt and his wife were educated in role of OT and goals were reviewed verbally. Pt is more alert today and able to follow 1 step commands consistently given increased time for processing at times. Pt reports increased independence with ADL's but reports no warning for BM. Pt performed sit to stand and step pivot transfer from recliner to 3:1 with Min A-Min guard assist. Peri-care following BM with Mod A as pt is reliant on bilateral UE support/RW. O2 at beginning of therapy session was 94% on 2L via [place] and this decreased to 92-93% with functional moblity and transfers. NT & transport came into room stating pt was scheduled to go off floor for testing. At conslusion of therapy session, pt O2 had returned to 94% on 2L via [place].   Recommendations for follow up therapy are one component of a multi-disciplinary discharge planning process, led by the attending physician.  Recommendations may be updated based on patient status, additional functional criteria and insurance authorization.    Follow Up Recommendations  Acute inpatient rehab (3hours/day)    Assistance Recommended at Discharge Frequent or constant Supervision/Assistance  Patient can return home with the following  A little help with walking and/or transfers;A little help with  bathing/dressing/bathroom;Assistance with cooking/housework;Assist for transportation;Help with stairs or ramp for entrance   Equipment Recommendations  BSC/3in1    Recommendations for Other Services      Precautions / Restrictions Precautions Precautions: Fall;Other (comment) Precaution Comments: watch sats, foley with irrigation, cortrak Restrictions Weight Bearing Restrictions: No       Mobility Bed Mobility   General bed mobility comments: Pt received up in recliner chair    Transfers Overall transfer level: Needs assistance Equipment used: Rolling walker (2 wheels) Transfers: Sit to/from Stand, Bed to chair/wheelchair/BSC Sit to Stand: Min guard Stand pivot transfers: Min guard   Step pivot transfers: Min assist     General transfer comment: VC's for hand placement and safety/sequencing. Pt was Min guard A from chair to 3:1 and then Min A from 3:1 back to chair.     Balance Overall balance assessment: Needs assistance Sitting-balance support: Bilateral upper extremity supported, Feet supported, Single extremity supported Sitting balance-Leahy Scale: Fair Sitting balance - Comments: static sitting without assist at bed and toilet   Standing balance support: During functional activity, Reliant on assistive device for balance Standing balance-Leahy Scale: Poor Standing balance comment: Relaint on bilateral UE support with RW in standing     ADL either performed or assessed with clinical judgement   ADL Overall ADL's : Needs assistance/impaired Eating/Feeding: NPO   Grooming: Set up;Sitting   Toilet Transfer: Min guard;Rolling walker (2 wheels);BSC/3in1;Stand-pivot;Squat-pivot   Toileting- Clothing Manipulation and Hygiene: Moderate assistance;Sit to/from stand Toileting - Clothing Manipulation Details (indicate cue type and reason): Pt is Mod  A for pericare after a BM today.   Functional mobility during ADLs:  Minimal assistance;Rolling walker (2  wheels) General ADL Comments: Pt and his wife were educated in role of OT and goals were reviewed verbally. Pt is more alert today and able to follow 1 step commands consistently with increased time for processing at times. Pt reports increased independence with ADL's but reports no warning for BM. Pt performed sit to stand and step pivot transfer from recliner to 3:1 with Min A-Min guard assist. Pericare following BM at Mod A. O2 at beginning of therapy session was 94% on 2L via [place] and this decreased to 92-93% with functional moblity and transfers. NT & transport came into room stating pt was scheduled to go off floor for testing. At conslusion of therapy session, pt O2 had returned to 94% on 2L via [place].    Extremity/Trunk Assessment Upper Extremity Assessment Upper Extremity Assessment: Generalized weakness;LUE deficits/detail;Defer to OT evaluation LUE Deficits / Details: reports pain in L UE/shoulder   Lower Extremity Assessment Lower Extremity Assessment: Defer to PT evaluation   Cervical / Trunk Assessment Cervical / Trunk Assessment: Kyphotic    Vision Baseline Vision/History: 1 Wears glasses Ability to See in Adequate Light: 0 Adequate Patient Visual Report: No change from baseline Vision Assessment?: No apparent visual deficits          Cognition Arousal/Alertness: Awake/alert Behavior During Therapy: WFL for tasks assessed/performed Overall Cognitive Status: Impaired/Different from baseline     Following Commands: Follows one step commands consistently     Problem Solving: Slow processing General Comments: pt following all commands with delay at times              General Comments NT & transport came to get pt for testing off the floor    Pertinent Vitals/ Pain       Pain Assessment Pain Assessment: Faces Faces Pain Scale: Hurts little more Pain Location: left shoulder Pain Descriptors / Indicators: Aching Pain Intervention(s): Limited activity within patient's  tolerance, Monitored during session, Repositioned  Home Living  Please refer to intial OT assessment for home set-up   Prior Functioning/Environment  Please refer to OT initial assessment for prior level of function. Pt reports Mod I    Frequency  Min 2X/week        Progress Toward Goals  OT Goals(current goals can now be found in the care plan section)  Progress towards OT goals: Progressing toward goals  Acute Rehab OT Goals Patient Stated Goal: None stated OT Goal Formulation: With patient Time For Goal Achievement: 05/10/22 Potential to Achieve Goals: Good  Plan Discharge plan remains appropriate    AM-PAC OT "6 Clicks" Daily Activity     Outcome Measure   Help from another person eating meals?: A Little Help from another person taking care of personal grooming?: A Little Help from another person toileting, which includes using toliet, bedpan, or urinal?: A Little Help from another person bathing (including washing, rinsing, drying)?: A Lot Help from another person to put on and taking off regular upper body clothing?: A Little Help from another person to put on and taking off regular lower body clothing?: A Lot 6 Click Score: 16    End of Session Equipment Utilized During Treatment: Gait belt;Rolling walker (2 wheels);Oxygen  OT Visit Diagnosis: Unsteadiness on feet (R26.81);Muscle weakness (generalized) (M62.81);Other symptoms and signs involving cognitive function;Cognitive communication deficit (R41.841) Symptoms and signs involving cognitive functions: Cerebral infarction Pain - Right/Left: Left Pain - part of body: Shoulder   Activity Tolerance Patient tolerated treatment well   Patient  Left in chair;with call bell/phone within reach;with family/visitor present;with nursing/sitter in room   Nurse Communication Other (comment);Mobility status (Pt w/ frequent BM)        Time: 7034-0352 OT Time Calculation (min): 13 min  Charges: OT General Charges $OT  Visit: 1 Visit OT Treatments $Self Care/Home Management : 8-22 mins   Kristilyn Coltrane Beth Dixon, OTR/L 05/07/2022, 11:43 AM

## 2022-05-07 NOTE — Procedures (Signed)
Interventional Radiology Procedure Note  Procedure: FLUORO 24 FR GTUBE    Complications: None  Estimated Blood Loss:  0  Findings: FULL USE TOMORROW    M. Daryll Brod, MD

## 2022-05-07 NOTE — TOC Progression Note (Addendum)
Transition of Care [facility]) - Progression Note    Patient Details  Name: [name] MRN: [id number] Date of Birth: July 16, 1941  Transition of Care [facility]) CM/SW [place], [place] Phone Number: 05/07/2022, 3:02 PM  Clinical Narrative:     CSW was informed by Pamala Hurry with CIR that patients insurance denied CIR. CSW called patients spouse who is in agreement to SNF placement at [facility] for patient . CSW spoke with Nicki at [facility] who confirmed SNF bed for patient.CSW requested updated PT/OT eval. CSW following to start insurance authorization. CSW will continue to follow and assist with patients dc planning needs.  Expected Discharge Plan: [place] Barriers to Discharge: Continued Medical Work up  Expected Discharge Plan and Services Expected Discharge Plan: [place] In-house Referral: Clinical Social Work     Living arrangements for the past 2 months: Single Family Home                                       Social Determinants of Health (SDOH) Interventions    Readmission Risk Interventions     View : No data to display.

## 2022-05-07 NOTE — Progress Notes (Signed)
Mobility Specialist Progress Note    05/07/22 1653  Mobility  Activity Contraindicated/medical hold   Pt needing abd binder. Will f/u as schedule permits.   Hildred Alamin Mobility Specialist  Primary: 5N M.S. Phone: [phone] Secondary: 6N M.S. Phone: [phone]

## 2022-05-07 NOTE — Progress Notes (Signed)
Heart Failure Navigator Progress Note  Following this hospitalization to assess for HV TOC readiness.   Continue to follow for plan of care for possible Hf TOC appointment after CIR stay. Waiting on time frame.   Earnestine Leys, BSN, Clinical cytogeneticist Only

## 2022-05-07 NOTE — Progress Notes (Signed)
Nutrition Follow-up  DOCUMENTATION CODES:  Obesity unspecified  INTERVENTION:  Once PEG is in place, resume the following when cleared for use by Radiology: Glucerna 1.5 at goal rate of 4m/h (13242md) Free water flush of 100 q4h This regimen will provide 1980kcal, 109g of protein, 176g of carbs, and 160250mf free water (TF+flush)  NUTRITION DIAGNOSIS:  Inadequate oral intake related to inability to eat, poor appetite as evidenced by per patient/family report. - remains applicable  GOAL:  Patient will meet greater than or equal to 90% of their needs - progressing, PEG to be placed for nutrition  MONITOR:  PO intake, Supplement acceptance, Diet advancement, Labs, Weight trends  REASON FOR ASSESSMENT:  Consult Assessment of nutrition requirement/status  ASSESSMENT:  Pt admitted with acute on chronic respiratory failure with hypoxia. PMH significant for COPD, HFrEF (40%), T2DM and CAD s/p CABG.  5/14 - LP (results showed bacterial meningitis) 5/15 - MBS, SLP Diet Recommendations: NPO due to silent aspiration 5/19 - Cortrak tube placed (gastric)   ***  Current TF Regimen: On hold for PEG placement - Glucerna 1.5 @ 55 ml/hr (1320 ml/day) - Free water flushes of 100 ml q 4 hours  Nutritionally Relevant Medications: Scheduled Meds:  free water  100 mL Per Tube Q4H   insulin aspart  0-15 Units Subcutaneous TID WC   insulin aspart  0-5 Units Subcutaneous QHS   Continuous Infusions:  cefTRIAXone (ROCEPHIN)  IV 2 g (05/07/22 1028)   feeding supplement (GLUCERNA 1.5 CAL) Stopped (05/07/22 0016)   PRN Meds: ondansetron  Labs Reviewed: Sodium (5/22) 146 CBG ranges from 119-133 mg/dL over the last 24 hours HgbA1c 6.5% (5/12)  NUTRITION - FOCUSED PHYSICAL EXAM: Flowsheet Row Most Recent Value  Orbital Region No depletion  Upper Arm Region No depletion  Thoracic and Lumbar Region No depletion  Buccal Region No depletion  Temple Region No depletion  Clavicle Bone  Region No depletion  Clavicle and Acromion Bone Region No depletion  Scapular Bone Region No depletion  Dorsal Hand Mild depletion  Patellar Region Mild depletion  Anterior Thigh Region No depletion  Posterior Calf Region No depletion  Edema (RD Assessment) None  Hair Reviewed  Eyes Reviewed  Mouth Reviewed  Skin Reviewed  Nails Reviewed   Diet Order:   Diet Order             Diet NPO time specified  Diet effective now                   EDUCATION NEEDS:  Education needs have been addressed  Skin:  Skin Assessment: Reviewed RN Assessment  Last BM:  5/11 per RN documentation  Height:  Ht Readings from Last 1 Encounters:  04/26/22 '5\' 6"'$  (1.676 m)   Weight:  Wt Readings from Last 1 Encounters:  05/07/22 88.6 kg   Ideal Body Weight:  64.5 kg  BMI:  Body mass index is 31.54 kg/m.  Estimated Nutritional Needs:  Kcal:  1800-2000 Protein:  90-105g Fluid:  >/=1.8L   RacRanell PatrickD, LDN Clinical Dietitian RD pager # available in [facility] hours/weekend pager # available in [facility]

## 2022-05-07 NOTE — Progress Notes (Signed)
Inpatient Rehabilitation Admissions Coordinator   I requested with Dr Avon Gully for peer to peer with Bernadene Bell MD with deadline of 12 noon today. I await the decision for possible CIR admit.   Danne Baxter, RN, MSN Rehab Admissions Coordinator [phone] 05/07/2022 12:01 PM

## 2022-05-07 NOTE — Progress Notes (Signed)
Physical Therapy Treatment Patient Details Name: [name] MRN: [id number] DOB: [birth year] Today's Date: 05/07/2022   History of Present Illness 81 y.o. male adm 5/11 with edema and orthopnea with acute on chronic CHF and COPD. 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 5/13 MRI with small SAH vs meningitis. 5/14 respiratory distress and LP with bacterial meningitis. PMhx: COPD on O2, HFrEF, DM2, CAD s/p CABG, depression, macular degeneration, HTN, HLD    PT Comments    PT pleasant and able to progress gait distance and activity tolerance this session with decreased supplemental oxygen demand. Pt on 3-6L during activity to maintain sats with increase tolerance for HEP but continues to demonstrate weakness, impaired balance and need for all functional mobility. Wife present during session.   HR 110   Recommendations for follow up therapy are one component of a multi-disciplinary discharge planning process, led by the attending physician.  Recommendations may be updated based on patient status, additional functional criteria and insurance authorization.  Follow Up Recommendations  Acute inpatient rehab (3hours/day)     Assistance Recommended at Discharge Frequent or constant Supervision/Assistance  Patient can return home with the following Assistance with cooking/housework;Assist for transportation;Help with stairs or ramp for entrance;A lot of help with bathing/dressing/bathroom;A little help with walking and/or transfers   Equipment Recommendations  Rolling walker (2 wheels);BSC/3in1    Recommendations for Other Services       Precautions / Restrictions Precautions Precautions: Fall;Other (comment) Precaution Comments: watch sats, foley with irrigation, cortrak Restrictions Weight Bearing Restrictions: No     Mobility  Bed Mobility               General bed mobility comments: sitting at [facility] on arrival     Transfers Overall transfer level: Needs assistance   Transfers: Sit to/from Stand Sit to Stand: Min guard Stand pivot transfers: Min guard         General transfer comment: cues for hand placement with tactile cues to rise from surface and assist for lines. PT pivoted from bed to [facility]    Ambulation/Gait Ambulation/Gait assistance: Min assist Gait Distance (Feet): 160 Feet Assistive device: Rolling walker (2 wheels) Gait Pattern/deviations: Step-through pattern, Decreased stride length, Trunk flexed   Gait velocity interpretation: <1.8 ft/sec, indicate of risk for recurrent falls   General Gait Details: cues for posture, proximity to RW and min assist for balance as pt with 3 partial LOB during gait on 6L with sats 93% without consistent pleth and able to return to 3L at rest with 95%   Stairs             Wheelchair Mobility    Modified Rankin (Stroke Patients Only)       Balance Overall balance assessment: Needs assistance   Sitting balance-Leahy Scale: Fair Sitting balance - Comments: static sitting without assist at bed and toilet   Standing balance support: Reliant on assistive device for balance, During functional activity Standing balance-Leahy Scale: Poor Standing balance comment: bil UE support on RW                            Cognition Arousal/Alertness: Awake/alert Behavior During Therapy: WFL for tasks assessed/performed Overall Cognitive Status: Impaired/Different from baseline                         Following Commands: Follows one step commands consistently  Problem Solving: Slow processing          Exercises General Exercises - Lower Extremity Long Arc Quad: Both, Seated, AROM, 20 reps Hip Flexion/Marching: Both, Seated, 15 reps, AROM Other Exercises Other Exercises: pt performed 5 repeated sit to stands with reliance on armrests in 60 sec    General Comments        Pertinent Vitals/Pain Pain  Assessment Pain Score: 4  Pain Location: left shoulder Pain Descriptors / Indicators: Aching Pain Intervention(s): Limited activity within patient's tolerance, Monitored during session, Repositioned    Home Living                          Prior Function            PT Goals (current goals can now be found in the care plan section) Acute Rehab PT Goals Time For Goal Achievement: 05/22/2022 Potential to Achieve Goals: Good Progress towards PT goals: Progressing toward goals    Frequency    Min 3X/week      PT Plan Current plan remains appropriate    Co-evaluation              AM-PAC PT "6 Clicks" Mobility   Outcome Measure  Help needed turning from your back to your side while in a flat bed without using bedrails?: A Little Help needed moving from lying on your back to sitting on the side of a flat bed without using bedrails?: A Little Help needed moving to and from a bed to a chair (including a wheelchair)?: A Little Help needed standing up from a chair using your arms (e.g., wheelchair or bedside chair)?: A Little Help needed to walk in hospital room?: A Lot Help needed climbing 3-5 steps with a railing? : Total 6 Click Score: 15    End of Session Equipment Utilized During Treatment: Oxygen Activity Tolerance: Patient tolerated treatment well Patient left: with call bell/phone within reach;in chair;with chair alarm set;with family/visitor present Nurse Communication: Mobility status PT Visit Diagnosis: Other abnormalities of gait and mobility (R26.89);Difficulty in walking, not elsewhere classified (R26.2);Muscle weakness (generalized) (M62.81);Pain     Time: 6389-3734 PT Time Calculation (min) (ACUTE ONLY): 38 min  Charges:  $Gait Training: 8-22 mins $Therapeutic Exercise: 8-22 mins $Therapeutic Activity: 8-22 mins                     Joziyah Roblero P, PT Acute Rehabilitation Services Pager: [phone] Office: [place] 05/07/2022, 10:39 AM

## 2022-05-07 NOTE — Progress Notes (Signed)
Inpatient Rehabilitation Admissions Coordinator   [facility] denied Cir admit. Peer to peer not completed. I contacted his wife by phone and she is aware of denial. I have alerted acute team and TOC. We will sign off.  Danne Baxter, RN, MSN Rehab Admissions Coordinator [phone] 05/07/2022 3:02 PM

## 2022-05-07 NOTE — Progress Notes (Addendum)
Progress Note  Patient: [name] CBU:[id number] DOB: [birth year]  DOA: 04/25/2022  DOS: 05/07/2022    Brief hospital course: [name] is a 81 y.o. 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 He was admitted for acute on chronic hypoxic respiratory failure with suspicion of acute CHF and/or pneumonia with subsequent CTA chest showing no PE. He was afebrile with leukocytosis and low back pain in addition to his chronic neck pain. Imaging revealed a presacral density, adrenal mass, upper normal sized mediastinal lymph nodes. CT head showed nonspecific 35m hyperdense focus if periventricular parietal white matter for which subsequent MRI was ordered. This was severely motion degraded and limited, but revealed abnormal diffusion signal abnormality layering within the occipital horns of lateral ventricles and Sylvian fissure suggestive of SAH vs. meningitis. Neurology was consulted, performed LP which is consistent with bacterial meningitis. 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  Assessment and Plan:  Acute on chronic hypoxic respiratory failure, improving  Presumably due primarily to aspiration pneumonia,atelectasis/edema. CTA chest without PE.  Continue to wean oxygen - goal O2 sats 90-92% Patient is confirmed to be DNR/DNI. Continue baseline COPD medications and prn albuterol. Encourage frequent incentive spirometry/flutter as tolerated  Bacterial meningitis; confirmed H influenza, POA: Concurrent metabolic encephalopathy/rule out hospital delirium Negative gram stain, though Neutrophils severely elevated with low glucose, high protein on LP 5/14.  - ID consulted - will complete ceftriaxone 2 gram q12hrs on  5/28 - Continue droplet precautions.  - Household contacts over the past 7 days have been given chemoprophylaxis on 5/14. 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 PEG for definitive access to give medications and ensure proper caloric/free water intake. -Once PEG in place and confirmed we will discontinue cortrack  Acute renal failure, resolving:  Secondary to multiple insults including lasix, NPO, repeated contrast dye loads, vancomycin.  DC IVF and increase free water flushes with NG feeds for volume management  Acute urinary retention, improving Concurrent hematuria, resolving Continue Foley catheter, urine output/color improving Urology following, appreciate insight recommendations, hematuria resolved - will attempt voiding trial and follow UOP/bladder scans. Low threshold to re-insert foley.  Severe lumbago, resolved -Likely in the setting of meningitis -Continue tylenol, robaxin, heating pad -Dilaudid for breakthrough pain only  Acute on chronic HFREF -LVEF 45-50%, based on more recent echo, LVEF has improved to 50-55%.  Holding lasix - cardiology consulted.  - Continue metoprolol, giving IV scheduled while NPO.  Acute CVA:  Suggested by 454mdiffusion signal abnormality involving the posterior left frontotemporal region on limited MRI.  - Ok to start ASA, though made strict NPO due to ongoing silent aspiration.  - Cortrak placed 5/19 - plan to transition to PEG next week if no meaningful improvement in swallowing  CAD s/p CABG:  Troponin initially negative at 14, subsequent evidence of demand ischemia (up to 56) due to sepsis. ACS is not currently suspected.  - Continue aspirin, metoprolol, statin - Cardiology consulted, no changes to management or ischemic work up currently  planned.  Multifocal pneumonia:  - Increasing  consolidations on CTA coinciding with respiratory deterioration. - Started abx and will continue as above.  - Monitor blood cultures drawn at admission (unchanged 1 of 4 S. anginosis and other is NGTD x5 days)  Presacral mass:  Indeterminate, incidental. Not rim-enhancing. 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 - Renin and aldosterone pending - Cortisol is 49.2. Metanephrines negative. - Needs PET to delineate this as well as presacral density on CT.   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 Not consistent with seizure. improving  NIDT2DM: HbA1c 6.5%, controlled.  - Continue moderate SSI, hold metformin with contrast exposures.   Obesity: Estimated body mass index is 31.54 kg/m as calculated from the following:   Height as of this encounter: '5\' 6"'$  (1.676 m).   Weight as of this encounter: 88.6 kg.  Subjective: No acute issues or events overnight, mental status continues to improve, increasing physical therapy tolerance, no longer meeting requirements for CIR, discussed with family and patient plan for discharge to SNF once PEG tube is placed and patient has bed and insurance approval.  Objective: Vitals:   05/07/22 1230 05/07/22 1235 05/07/22 1240 05/07/22 1318  BP: 124/65 131/67 127/65 130/72  Pulse: 98 98 95 (!) 102  Resp: '20 20 20 20  '$ Temp:    97.8 F (36.6 C)  TempSrc:    Oral  SpO2: 93% 95% 95% 95%  Weight:      Height:       General:  Pleasantly resting in chair, No acute distress. HEENT: Speech quiet.  Core track tube intact left nare Neck:  Without mass or deformity.  Trachea is midline. Lungs: Scant bibasilar rales. Heart:  Regular rate and rhythm.  Without murmurs, rubs, or gallops. Abdomen:  Soft, nontender, nondistended.  Without  guarding or rebound. Extremities: Without cyanosis, clubbing, edema, or obvious deformity. Vascular:  Dorsalis pedis and posterior tibial pulses palpable bilaterally. Skin:  Warm and dry, no erythema, no ulcerations.  Data Personally reviewed: CBC: Recent Labs  Lab 05/03/22 0236 05/04/22 0529 05/05/22 0615 05/06/22 0645 05/07/22 0257  WBC 13.7* 15.4* 13.5* 12.6* 11.3*  HGB 12.2* 11.7* 11.9* 11.2* 11.0*  HCT 39.7 38.2* 37.8* 36.0* 36.0*  MCV 96.1 93.2 94.0 95.0 95.0  PLT 313 284 267 280 272    Basic Metabolic Panel: Recent Labs  Lab 05/02/22 0444 05/03/22 0236 05/03/22 1659 05/04/22 0529 05/04/22 1746 05/05/22 0615 05/05/22 1722 05/06/22 0645  NA 145 145  --  145  --  145  --  146*  K 4.1 3.4*  --  3.4*  --  3.6  --  4.2  CL 108 111  --  112*  --  112*  --  115*  CO2 25 25  --  26  --  28  --  27  GLUCOSE 175* 139*  --  154*  --  153*  --  146*  BUN 41* 26*  --  22  --  20  --  19  CREATININE 1.30* 0.92  --  0.77  --  0.76  --  0.72  CALCIUM 8.2* 8.3*  --  8.1*  --  8.0*  --  8.2*  MG  --   --    < > 2.0 2.1 2.0 2.1 2.0  PHOS  --   --    < >  2.2* 2.4* 2.9 2.7 2.8   < > = values in this interval not displayed.    GFR: Estimated Creatinine Clearance: 79.3 mL/min (by C-G formula based on SCr of 0.72 mg/dL).  HbA1C: No results for input(s): HGBA1C in the last 72 hours.  CBG: Recent Labs  Lab 05/06/22 1738 05/06/22 2138 05/07/22 0021 05/07/22 0357 05/07/22 1123  GLUCAP 129* 125* 133* 119* 119*    Urine analysis:    Component Value Date/Time   COLORURINE AMBER (A) 04/30/2022 0646   APPEARANCEUR CLEAR 04/30/2022 0646   LABSPEC 1.025 04/30/2022 0646   PHURINE 5.0 04/30/2022 0646   GLUCOSEU NEGATIVE 04/30/2022 0646   HGBUR MODERATE (A) 04/30/2022 0646   BILIRUBINUR NEGATIVE 04/30/2022 0646   [place] 04/30/2022 0646   PROTEINUR NEGATIVE 04/30/2022 0646   NITRITE NEGATIVE 04/30/2022 0646   LEUKOCYTESUR NEGATIVE 04/30/2022 0646   Recent  Results (from the past 240 hour(s))  CSF culture w Gram Stain     Status: None   Collection Time: 04/28/22 12:06 PM   Specimen: CSF; Cerebrospinal Fluid  Result Value Ref Range Status   Specimen Description CSF  Final   Special Requests Normal  Final   Gram Stain   Final    WBC PRESENT,BOTH PMN AND MONONUCLEAR NO ORGANISMS SEEN CYTOSPIN SMEAR    Culture   Final    NO GROWTH 3 DAYS Performed at [facility], [place] [address] [place], [place] [address]    Report Status 05/01/2022 FINAL  Final     DG Abd 1 View  Result Date: 05/07/2022 CLINICAL DATA:  Evaluation of distribution of oral contrast prior to possible gastrostomy tube placement. EXAM: ABDOMEN - 1 VIEW COMPARISON:  05/03/2022 FINDINGS: Feeding tube continues to extend into the proximal duodenum. Contrast is present in the colon as well as air outlining the transverse colon, splenic flexure, descending colon, sigmoid colon and rectum. The colon is not dilated. There is no evidence of bowel obstruction or significant ileus. In the projection of the x-ray, the transverse colon overlies the course of the feeding tube within the stomach. IMPRESSION: Contrast within the colon. No evidence of bowel obstruction or significant ileus. The transverse colon overlies the course of the feeding tube in the stomach in the projection of the x-ray. Electronically Signed   By: Aletta Edouard M.D.   On: 05/07/2022 09:09   IR GASTROSTOMY TUBE MOD SED  Result Date: 05/07/2022 INDICATION: Acute on chronic respiratory failure, hypoxia, dysphagia EXAM: TWENTY FRENCH FLUOROSCOPIC 24 PULL-THROUGH GASTROSTOMY Date:  05/07/2022 05/07/2022 12:53 pm Radiologist:  Jerilynn Mages. Daryll Brod, MD Guidance:  Fluoroscopic MEDICATIONS: Ancef 2 g; Antibiotics were administered within 1 hour of the procedure. Glucagon 0.5 mg IV ANESTHESIA/SEDATION: Versed 1.0 mg IV; Fentanyl 50 mcg IV Moderate Sedation Time:  15 minutes The patient was continuously monitored during the procedure  by the interventional radiology nurse under my direct supervision. CONTRAST:  10 cc-administered into the gastric lumen. FLUOROSCOPY: Fluoroscopy Time: 2 minutes 48 seconds (115 mGy). COMPLICATIONS: None immediate. PROCEDURE: Informed consent was obtained from the patient following explanation of the procedure, risks, benefits and alternatives. The patient understands, agrees and consents for the procedure. All questions were addressed. A time out was performed. Maximal barrier sterile technique utilized including caps, mask, sterile gowns, sterile gloves, large sterile drape, hand hygiene, and betadine prep. The left upper quadrant was sterilely prepped and draped. An oral gastric catheter was inserted into the stomach under fluoroscopy. The existing nasogastric feeding tube was removed. Pharmacist, community  was injected into the stomach for insufflation and visualization under fluoroscopy. The air distended stomach was confirmed beneath the anterior abdominal wall in the frontal and lateral projections. Under sterile conditions and local anesthesia, a 55 gauge trocar needle was utilized to access the stomach percutaneously beneath the left subcostal margin. Needle position was confirmed within the stomach under biplane fluoroscopy. Contrast injection confirmed position also. A single T tack was deployed for gastropexy. Over an Amplatz guide wire, a 9-French sheath was inserted into the stomach. A snare device was utilized to capture the oral gastric catheter. The snare device was pulled retrograde from the stomach up the esophagus and out the oropharynx. The 20-French pull-through gastrostomy was connected to the snare device and pulled antegrade through the oropharynx down the esophagus into the stomach and then through the percutaneous tract external to the patient. The gastrostomy was assembled externally. Contrast injection confirms position in the stomach. Images were obtained for documentation. The patient tolerated  procedure well. No immediate complication. IMPRESSION: Fluoroscopic insertion of a 20-French "pull-through" gastrostomy. Electronically Signed   By: Jerilynn Mages.  Shick M.D.   On: 05/07/2022 13:09     Family Communication: Spouse and SIL at bedside.  Disposition: Status is: Inpatient Remains inpatient appropriate because: Inpatient management of bacterial meningitis and respiratory failure Planned Discharge Destination: SNF -24 to 48 hours pending clinical course, bed availability and insurance approval.  Holli Humbles DO  05/07/2022 3:31 PM Pager - secure chat

## 2022-05-08 DIAGNOSIS — J9621 Acute and chronic respiratory failure with hypoxia: Secondary | ICD-10-CM | POA: Diagnosis not present

## 2022-05-08 LAB — GLUCOSE, CAPILLARY
Glucose-Capillary: 100 mg/dL — ABNORMAL HIGH (ref 70–99)
Glucose-Capillary: 104 mg/dL — ABNORMAL HIGH (ref 70–99)
Glucose-Capillary: 111 mg/dL — ABNORMAL HIGH (ref 70–99)
Glucose-Capillary: 86 mg/dL (ref 70–99)
Glucose-Capillary: 94 mg/dL (ref 70–99)
Glucose-Capillary: 96 mg/dL (ref 70–99)

## 2022-05-08 LAB — BASIC METABOLIC PANEL
Anion gap: 7 (ref 5–15)
BUN: 17 mg/dL (ref 8–23)
CO2: 28 mmol/L (ref 22–32)
Calcium: 9.1 mg/dL (ref 8.9–10.3)
Chloride: 113 mmol/L — ABNORMAL HIGH (ref 98–111)
Creatinine, Ser: 0.81 mg/dL (ref 0.61–1.24)
GFR, Estimated: >60 mL/min (ref >60)
Glucose, Bld: 95 mg/dL (ref 70–99)
Potassium: 4.7 mmol/L (ref 3.5–5.1)
Sodium: 148 mmol/L — ABNORMAL HIGH (ref 135–145)

## 2022-05-08 LAB — CBC
HCT: 37.7 % — ABNORMAL LOW (ref 39.0–52.0)
Hemoglobin: 11.7 g/dL — ABNORMAL LOW (ref 13.0–17.0)
MCH: 29 pg (ref 26.0–34.0)
MCHC: 31 g/dL (ref 30.0–36.0)
MCV: 93.3 fL (ref 80.0–100.0)
Platelets: 327 10*3/uL (ref 150–400)
RBC: 4.04 MIL/uL — ABNORMAL LOW (ref 4.22–5.81)
RDW: 13.5 % (ref 11.5–15.5)
WBC: 10.4 10*3/uL (ref 4.0–10.5)
nRBC: 0 % (ref 0.0–0.2)

## 2022-05-08 MED ORDER — SILODOSIN 8 MG PO CAPS
8.0000 mg | ORAL_CAPSULE | Freq: Every day | ORAL | Status: DC
  Administered 2022-05-08: 8 mg
  Filled 2022-05-08 (×2): qty 1

## 2022-05-08 MED ORDER — GLUCERNA 1.5 CAL PO LIQD
1000.0000 mL | ORAL | Status: DC
  Administered 2022-05-08: 1000 mL
  Filled 2022-05-08 (×3): qty 1000

## 2022-05-08 MED ORDER — FREE WATER
150.0000 mL | Status: DC
  Administered 2022-05-08 – 2022-05-09 (×9): 150 mL

## 2022-05-08 NOTE — Progress Notes (Signed)
PT DC Recommendation Note    05/08/22 0900   PT - Assessment/Plan  Follow Up Recommendations Skilled nursing-short term rehab (<3 hours/day)    Pt was denied rehab and plans underway for SNF placement.  Rec updated based on latest events.    Lavonia Dana, PT   Acute Rehabilitation Services  Pager [phone] Office [phone] 05/08/2022

## 2022-05-08 NOTE — Progress Notes (Signed)
Patient unable to void since 1700 yesterday when foley was removed.  Bladder scan showed 295 mL.  I/O cath performed using sterile technique with some difficulty getting past the prostate.  650 mL of clear, amber urine drained from bladder.  Small blood clot noted in the catheter tip when it was removed.  Patient tolerated well.  Will resume Q 6 hour bladder scans.  Jodell Cipro

## 2022-05-08 NOTE — Plan of Care (Signed)
°  Problem: Clinical Measurements: °Goal: Ability to maintain clinical measurements within normal limits will improve °Outcome: Progressing °Goal: Will remain free from infection °Outcome: Progressing °Goal: Diagnostic test results will improve °Outcome: Progressing °Goal: Respiratory complications will improve °Outcome: Progressing °  °

## 2022-05-08 NOTE — Progress Notes (Signed)
Mobility Specialist Progress Note    05/08/22 1657  Mobility  Activity Ambulated with assistance in hallway  Level of Assistance Minimal assist, patient does 75% or more  Assistive Device Front wheel walker  Distance Ambulated (ft) 200 ft  Activity Response Tolerated well  $Mobility charge 1 Mobility   Pt received in chair and agreeable. Ambulated on 3LO2 but could not show a reliable pleth. Encouraged pursed lip breathing. Took a few short standing rest breaks. Returned to chair with call bell in reach.    Hildred Alamin Mobility Specialist  Primary: 5N M.S. Phone: [phone] Secondary: 6N M.S. Phone: [phone]

## 2022-05-08 NOTE — TOC Progression Note (Addendum)
Transition of Care [facility]) - Progression Note    Patient Details  Name: [name] MRN: [id number] Date of Birth: 07/19/1941  Transition of Care [facility]) CM/SW [place], [place] Phone Number: 05/08/2022, 10:26 AM  Clinical Narrative:     Update- Patients insurance authorization has been approved Plan auth ID# [id number] [facility] ID# [id number]. Insurance authorization approved from 5/24-5/26. Patient has SNF bed at [facility] when medically ready.  Update- Patients insurance requested additional clinicals for review. CSW submitted clinicals requested. Patients insurance authorization pending. CSW will continue to follow. CSW informed Nicki with [facility] patient will be on IV antibiotics until 5/28.  CSW started insurance authorization for patient. Reference number # [id number]. Patients insurance authorization currently pending. Patient has SNF bed at [facility]. CSW will continue to follow and assist with patients dc planning needs.  Expected Discharge Plan: [place] Barriers to Discharge: Continued Medical Work up  Expected Discharge Plan and Services Expected Discharge Plan: [place] In-house Referral: Clinical Social Work     Living arrangements for the past 2 months: Single Family Home                                       Social Determinants of Health (SDOH) Interventions    Readmission Risk Interventions     View : No data to display.

## 2022-05-08 NOTE — Progress Notes (Signed)
Heart Failure Navigator Progress Note  Assessed for Heart & Vascular TOC clinic readiness.  Patient does not meet criteria at this time. With admission respiratory failure, CVA , bacterial meningitis.     Earnestine Leys, BSN, Clinical cytogeneticist Only

## 2022-05-08 NOTE — Progress Notes (Signed)
Occupational Therapy: 05/08/22  Pt: [name] MRN: [id number] DOB: Oct 12, 1941  Update: 05/08/22 Note that pt was denied AIR admission secondary to insurance. Pt will need SNF Rehab for OT to maximize independence with ADL's, self care and functional mobility/transfers. Per chart review, pt family is in agreement. Will update f/u recommendations in pt chart. [name], OTR/L

## 2022-05-08 NOTE — Progress Notes (Signed)
PROGRESS NOTE    [name]  OYD:[id number] DOB: August 16, 1941 DOA: 04/25/2022  PCP: Seward Carol, MD   Brief Narrative:  This 81 y.o. 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 He was admitted for acute on chronic hypoxic respiratory failure with suspicion of acute CHF and/or pneumonia with subsequent CTA chest showing no PE. He was afebrile with leukocytosis and low back pain in addition to his chronic neck pain. Imaging revealed a presacral density, adrenal mass, upper normal sized mediastinal lymph nodes. CT head showed nonspecific 14m hyperdense focus if periventricular parietal white matter for which subsequent MRI was ordered. This was severely motion degraded and limited, but revealed abnormal diffusion signal abnormality layering within the occipital horns of lateral ventricles and Sylvian fissure suggestive of SAH vs. meningitis. Neurology was consulted, performed LP which is consistent with bacterial meningitis. 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  Assessment & Plan:   Principal Problem:   Acute on chronic respiratory failure with hypoxia (HCC) Active Problems:   Acute on chronic systolic (congestive) heart failure (HCC)   COPD with acute exacerbation (HCC)   Presacral mass   Adrenal mass (HCC)   Neck mass   Tremor   Diabetes mellitus type II, non insulin dependent (HCC)   Ventriculitis of brain due to bacteria   Meningitis   Severe sepsis ([place]   Dysphonia   Paresis of right vocal cord  Acute on chronic hypoxic respiratory failure: > Improving. Presumably due to aspiration pneumonia /atelectasis/edema CTA chest without PE. Continue supplemental oxygen and wean as tolerated. Goal  SPO2 90 to 92%. Continue home inhalers as needed. Encourage incentive spirometry/flutter as tolerated.  H influenza bacterial meningitis,  POA: Concurrent metabolic and cephalopathy/rule out hospital delirium. Lumbar puncture confirms bacterial meningitis. Negative gram stain, Neutrophils severely elevated with low glucose, high protein on LP 5/14.  Infectious disease consulted.  Patient will complete ceftriaxone 2 g every 12 hours on 05/12/2022 Continue droplet precautions.  Household contacts over the past 7 days have been given chemoprophylaxis on 5/14.  No further needs in this regard per multiple physicians.   Dysphagia in the setting of recent CVA and vocal cord dysfunction: Patient had cortrak in place, patient had PEG tube insertion by IR 5/23. Cortrak removed.  Continue feeds through PEG tube.  Acute kidney injury: Resolved  Likely multifactorial.   Renal functions improved.  Acute urinary retention: Concurrent hematuria > resolved. Continue Foley catheter, urine output/color improving. Urology following.  Appreciate recommendation. Continue In and out cath, may require Foley insertion.   Severe back pain > improved. Likely in the setting of meningitis. Continue tylenol, robaxin, heating pad -Dilaudid for breakthrough pain only.   Acute on chronic systolic CHF: -LVEF 467-20% based on more recent echo, LVEF has improved to 50-55%.  cardiology consulted.  Holding Lasix. Continue metoprolol while patient is NPO.   Acute CVA: MRI showed 4 mm diffusion signal abnormality involving the left posterior frontotemporal region Continue aspirin 81 mg daily.  Resume medications through PEG tube.  CAD s/p CABG:  Troponin slightly elevated could be due to demand ischemia due to sepsis. ACS less likely. Continue aspirin, metoprolol, statin Cardiology consulted, no changes to management or ischemic work up currently planned.  Multifocal pneumonia: CTA showed increasing  consolidation with respiratory deterioration. Continue antibiotics as above. Monitor blood cultures drawn at admission (unchanged 1 of 4 S. anginosis and other is NGTD x 5 days)   Presacral mass: Indeterminate, incidental. Not rim-enhancing. Discussed with IR, suggests PET-CT prior to selecting a target for biopsy in the outpatient setting.   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 Renin and aldosterone pending Cortisol is 49.2. Metanephrines negative. Needs PET to delineate this as well as presacral density on CT.    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   Tremor:  Suspect this is due to CNS infection. Not consistent with seizure. improving   NIDT2DM: HbA1c 6.5%, controlled.  Continue moderate SSI, hold metformin with contrast exposures.    Obesity:  Estimated body mass index is 31.39 kg/m as calculated from the following:   Height as of this encounter: '5\' 6"'$  (1.676 m).   Weight as of this encounter: 88.2 kg.   Diet and Exercise discussed.   DVT prophylaxis: Heparin Code Status: DNR Family Communication: Wife at bedside Disposition Plan:   Status is: Inpatient Remains inpatient appropriate because: Needs inpatient management of bacterial meningitis and respiratory failure. Anticipated discharge to [facility] 5/25.    Consultants:  Infectious disease, neurology  Procedures: Lumbar puncture  antimicrobials:   Anti-infectives (From admission, onward)    Start     Dose/Rate Route Frequency Ordered Stop   05/07/22 0800  ceFAZolin (ANCEF) IVPB 2g/100 mL premix        2 g 200 mL/hr over 30 Minutes Intravenous To Radiology 05/06/22 1102 05/07/22 1245   05/02/22 1615  ceFAZolin (ANCEF) IVPB 2g/100 mL premix        2 g 200 mL/hr over 30 Minutes Intravenous To Radiology 05/02/22 1518 05/03/22 1615   05/01/22 1130  linezolid (ZYVOX) IVPB 600 mg  Status:  Discontinued        600 mg 300 mL/hr over 60 Minutes Intravenous Every 12 hours 05/01/22 1035  05/02/22 1024   05/01/22 1030  vancomycin (VANCOCIN) IVPB 1000 mg/200 mL premix  Status:  Discontinued        1,000 mg 200 mL/hr over 60 Minutes Intravenous Every 24 hours 05/01/22 0935 05/01/22 0957   04/29/22 2200  ampicillin (OMNIPEN) 2 g in sodium chloride 0.9 % 100 mL IVPB  Status:  Discontinued        2 g 300 mL/hr over 20 Minutes Intravenous Every 8 hours 04/29/22 1726 04/30/22 1115   04/29/22 1729  vancomycin variable dose per unstable renal function (pharmacist dosing)  Status:  Discontinued         Does not apply See admin instructions 04/29/22 1729 05/01/22 0935   04/28/22 1800  vancomycin (VANCOREADY) IVPB 1250 mg/250 mL  Status:  Discontinued        1,250 mg 166.7 mL/hr over 90 Minutes Intravenous Every 12 hours 04/28/22 0721 04/29/22 1729   04/28/22 1000  cefTRIAXone (ROCEPHIN) 2 g in sodium chloride 0.9 % 100 mL IVPB        2 g 200 mL/hr over 30 Minutes Intravenous Every 12 hours 04/28/22 0718     04/28/22 0815  vancomycin (VANCOREADY) IVPB 2000 mg/400 mL        2,000 mg 200 mL/hr over 120 Minutes Intravenous  Once 04/28/22 0718 04/28/22 1049   04/28/22 0800  ampicillin (OMNIPEN) 1 g in sodium chloride 0.9 % 100 mL IVPB  Status:  Discontinued  1 g 300 mL/hr over 20 Minutes Intravenous Every 6 hours 04/28/22 0710 04/28/22 0718   04/28/22 0800  ampicillin (OMNIPEN) 2 g in sodium chloride 0.9 % 100 mL IVPB  Status:  Discontinued        2 g 300 mL/hr over 20 Minutes Intravenous Every 4 hours 04/28/22 0718 04/29/22 1726   04/27/22 1430  cefTRIAXone (ROCEPHIN) 2 g in sodium chloride 0.9 % 100 mL IVPB  Status:  Discontinued        2 g 200 mL/hr over 30 Minutes Intravenous Every 24 hours 04/27/22 1338 04/28/22 0706   04/27/22 1430  azithromycin (ZITHROMAX) tablet 500 mg  Status:  Discontinued        500 mg Oral Daily 04/27/22 1338 04/28/22 0710   04/25/22 1215  cefTRIAXone (ROCEPHIN) 2 g in sodium chloride 0.9 % 100 mL IVPB        2 g 200 mL/hr over 30 Minutes Intravenous   Once 04/25/22 1207 04/25/22 1349   04/25/22 1215  azithromycin (ZITHROMAX) 500 mg in sodium chloride 0.9 % 250 mL IVPB        500 mg 250 mL/hr over 60 Minutes Intravenous  Once 04/25/22 1207 04/25/22 1349        Subjective: Patient was seen and examined at bedside.  Overnight events noted.   Patient reports feeling better, he is weaned down to 1.5 L, satting 94%.  PEG tube was inserted yesterday,  started for feeding today.  Tolerating well. Objective: Vitals:   05/08/22 0959 05/08/22 1000 05/08/22 1007 05/08/22 1012  BP:      Pulse: (!) 104 99  99  Resp: '20 20 20 '$ (!) 24  Temp:      TempSrc:      SpO2: 94% 95%  90%  Weight:      Height:        Intake/Output Summary (Last 24 hours) at 05/08/2022 1312 Last data filed at 05/08/2022 1000 Gross per 24 hour  Intake 533 ml  Output 1050 ml  Net -517 ml   Filed Weights   05/06/22 0419 05/07/22 0405 05/08/22 0455  Weight: 104 kg 88.6 kg 88.2 kg    Examination:  General exam: Appears comfortable, not in any acute distress. Respiratory system: CTA bilaterally, no wheezing, no crackles, normal respiratory effort. Cardiovascular system: S1 & S2 heard, regular rate and rhythm, no murmur. Gastrointestinal system: Abdomen is soft,  non tender, non distended, normal bowel sounds, PEG tube noted. Central nervous system: Alert and oriented x 3 . No focal neurological deficits. Extremities: No edema, no cyanosis, no clubbing. Skin: No rashes, lesions or ulcers Psychiatry: Judgement and insight appear normal. Mood & affect appropriate.     Data Reviewed: I have personally reviewed following labs and imaging studies  CBC: Recent Labs  Lab 05/04/22 0529 05/05/22 0615 05/06/22 0645 05/07/22 0257 05/08/22 0619  WBC 15.4* 13.5* 12.6* 11.3* 10.4  HGB 11.7* 11.9* 11.2* 11.0* 11.7*  HCT 38.2* 37.8* 36.0* 36.0* 37.7*  MCV 93.2 94.0 95.0 95.0 93.3  PLT 284 267 280 213 329   Basic Metabolic Panel: Recent Labs  Lab 05/03/22 0236  05/03/22 1659 05/04/22 0529 05/04/22 1746 05/05/22 0615 05/05/22 1722 05/06/22 0645 05/08/22 0619  NA 145  --  145  --  145  --  146* 148*  K 3.4*  --  3.4*  --  3.6  --  4.2 4.7  CL 111  --  112*  --  112*  --  115* 113*  CO2 25  --  26  --  28  --  27 28  GLUCOSE 139*  --  154*  --  153*  --  146* 95  BUN 26*  --  22  --  20  --  19 17  CREATININE 0.92  --  0.77  --  0.76  --  0.72 0.81  CALCIUM 8.3*  --  8.1*  --  8.0*  --  8.2* 9.1  MG  --    < > 2.0 2.1 2.0 2.1 2.0  --   PHOS  --    < > 2.2* 2.4* 2.9 2.7 2.8  --    < > = values in this interval not displayed.   GFR: Estimated Creatinine Clearance: 78.2 mL/min (by C-G formula based on SCr of 0.81 mg/dL). Liver Function Tests: No results for input(s): AST, ALT, ALKPHOS, BILITOT, PROT, ALBUMIN in the last 168 hours. No results for input(s): LIPASE, AMYLASE in the last 168 hours. No results for input(s): AMMONIA in the last 168 hours. Coagulation Profile: Recent Labs  Lab 05/07/22 0257  INR 1.0   Cardiac Enzymes: No results for input(s): CKTOTAL, CKMB, CKMBINDEX, TROPONINI in the last 168 hours. BNP (last 3 results) No results for input(s): PROBNP in the last 8760 hours. HbA1C: No results for input(s): HGBA1C in the last 72 hours. CBG: Recent Labs  Lab 05/07/22 2031 05/07/22 2358 05/08/22 0452 05/08/22 0746 05/08/22 1143  GLUCAP 84 100* 94 86 96   Lipid Profile: No results for input(s): CHOL, HDL, LDLCALC, TRIG, CHOLHDL, LDLDIRECT in the last 72 hours. Thyroid Function Tests: No results for input(s): TSH, T4TOTAL, FREET4, T3FREE, THYROIDAB in the last 72 hours. Anemia Panel: No results for input(s): VITAMINB12, FOLATE, FERRITIN, TIBC, IRON, RETICCTPCT in the last 72 hours. Sepsis Labs: No results for input(s): PROCALCITON, LATICACIDVEN in the last 168 hours.  No results found for this or any previous visit (from the past 240 hour(s)).       Radiology Studies: DG Abd 1 View  Result Date:  05/07/2022 CLINICAL DATA:  Evaluation of distribution of oral contrast prior to possible gastrostomy tube placement. EXAM: ABDOMEN - 1 VIEW COMPARISON:  05/03/2022 FINDINGS: Feeding tube continues to extend into the proximal duodenum. Contrast is present in the colon as well as air outlining the transverse colon, splenic flexure, descending colon, sigmoid colon and rectum. The colon is not dilated. There is no evidence of bowel obstruction or significant ileus. In the projection of the x-ray, the transverse colon overlies the course of the feeding tube within the stomach. IMPRESSION: Contrast within the colon. No evidence of bowel obstruction or significant ileus. The transverse colon overlies the course of the feeding tube in the stomach in the projection of the x-ray. Electronically Signed   By: Aletta Edouard M.D.   On: 05/07/2022 09:09   IR GASTROSTOMY TUBE MOD SED  Result Date: 05/07/2022 INDICATION: Acute on chronic respiratory failure, hypoxia, dysphagia EXAM: TWENTY FRENCH FLUOROSCOPIC 24 PULL-THROUGH GASTROSTOMY Date:  05/07/2022 05/07/2022 12:53 pm Radiologist:  Jerilynn Mages. Daryll Brod, MD Guidance:  Fluoroscopic MEDICATIONS: Ancef 2 g; Antibiotics were administered within 1 hour of the procedure. Glucagon 0.5 mg IV ANESTHESIA/SEDATION: Versed 1.0 mg IV; Fentanyl 50 mcg IV Moderate Sedation Time:  15 minutes The patient was continuously monitored during the procedure by the interventional radiology nurse under my direct supervision. CONTRAST:  10 cc-administered into the gastric lumen. FLUOROSCOPY: Fluoroscopy Time: 2 minutes 48 seconds (115 mGy). COMPLICATIONS: None immediate. PROCEDURE: Informed  consent was obtained from the patient following explanation of the procedure, risks, benefits and alternatives. The patient understands, agrees and consents for the procedure. All questions were addressed. A time out was performed. Maximal barrier sterile technique utilized including caps, mask, sterile gowns, sterile  gloves, large sterile drape, hand hygiene, and betadine prep. The left upper quadrant was sterilely prepped and draped. An oral gastric catheter was inserted into the stomach under fluoroscopy. The existing nasogastric feeding tube was removed. Air was injected into the stomach for insufflation and visualization under fluoroscopy. The air distended stomach was confirmed beneath the anterior abdominal wall in the frontal and lateral projections. Under sterile conditions and local anesthesia, a 93 gauge trocar needle was utilized to access the stomach percutaneously beneath the left subcostal margin. Needle position was confirmed within the stomach under biplane fluoroscopy. Contrast injection confirmed position also. A single T tack was deployed for gastropexy. Over an Amplatz guide wire, a 9-French sheath was inserted into the stomach. A snare device was utilized to capture the oral gastric catheter. The snare device was pulled retrograde from the stomach up the esophagus and out the oropharynx. The 20-French pull-through gastrostomy was connected to the snare device and pulled antegrade through the oropharynx down the esophagus into the stomach and then through the percutaneous tract external to the patient. The gastrostomy was assembled externally. Contrast injection confirms position in the stomach. Images were obtained for documentation. The patient tolerated procedure well. No immediate complication. IMPRESSION: Fluoroscopic insertion of a 20-French "pull-through" gastrostomy. Electronically Signed   By: Jerilynn Mages.  Shick M.D.   On: 05/07/2022 13:09    Scheduled Meds:  Chlorhexidine Gluconate Cloth  6 each Topical Daily   dorzolamide  1 drop Both Eyes BID   free water  150 mL Per Tube Q4H   heparin injection (subcutaneous)  5,000 Units Subcutaneous Q8H   insulin aspart  0-15 Units Subcutaneous TID WC   insulin aspart  0-5 Units Subcutaneous QHS   metoprolol tartrate  5 mg Intravenous Q6H    mometasone-formoterol  2 puff Inhalation BID   sodium chloride flush  3 mL Intravenous Q12H   tamsulosin  0.4 mg Oral QPC supper   umeclidinium bromide  1 puff Inhalation Daily   Continuous Infusions:  sodium chloride 20 mL/hr at 05/07/22 2238   cefTRIAXone (ROCEPHIN)  IV 2 g (05/08/22 1002)   dextrose Stopped (05/03/22 1200)   feeding supplement (GLUCERNA 1.5 CAL)       LOS: 13 days    Time spent: 50 mins    Ric Rosenberg, MD [facility]   If 7PM-7AM, please contact night-coverage

## 2022-05-09 DIAGNOSIS — J9621 Acute and chronic respiratory failure with hypoxia: Secondary | ICD-10-CM | POA: Diagnosis not present

## 2022-05-09 LAB — GLUCOSE, CAPILLARY
Glucose-Capillary: 129 mg/dL — ABNORMAL HIGH (ref 70–99)
Glucose-Capillary: 137 mg/dL — ABNORMAL HIGH (ref 70–99)
Glucose-Capillary: 137 mg/dL — ABNORMAL HIGH (ref 70–99)
Glucose-Capillary: 141 mg/dL — ABNORMAL HIGH (ref 70–99)

## 2022-05-09 LAB — MAGNESIUM: Magnesium: 1.9 mg/dL (ref 1.7–2.4)

## 2022-05-09 LAB — PHOSPHORUS: Phosphorus: 3.9 mg/dL (ref 2.5–4.6)

## 2022-05-09 MED ORDER — FUROSEMIDE 40 MG PO TABS
40.0000 mg | ORAL_TABLET | Freq: Every day | ORAL | 5 refills | Status: AC | PRN
Start: 2022-05-09 — End: ?

## 2022-05-09 MED ORDER — ACETAMINOPHEN 160 MG/5ML PO SOLN
650.0000 mg | ORAL | 0 refills | Status: AC | PRN
Start: 2022-05-09 — End: ?

## 2022-05-09 MED ORDER — ATORVASTATIN CALCIUM 40 MG PO TABS: 40.0000 mg | ORAL_TABLET | Freq: Every day | ORAL | 3 refills | Status: AC

## 2022-05-09 MED ORDER — METOPROLOL SUCCINATE ER 25 MG PO TB24: 25.0000 mg | ORAL_TABLET | Freq: Every day | ORAL | 0 refills | Status: AC

## 2022-05-09 MED ORDER — CHLORHEXIDINE GLUCONATE CLOTH 2 % EX PADS: 6.0000 | MEDICATED_PAD | Freq: Every day | CUTANEOUS | Status: DC

## 2022-05-09 MED ORDER — GLUCERNA 1.5 CAL PO LIQD: 1000.0000 mL | ORAL | 1 refills | Status: AC

## 2022-05-09 MED ORDER — AMLODIPINE BESYLATE 10 MG PO TABS: 10.0000 mg | ORAL_TABLET | Freq: Every day | ORAL | 1 refills | Status: AC

## 2022-05-09 MED ORDER — CEFTRIAXONE IV (FOR PTA / DISCHARGE USE ONLY): 2.0000 g | Freq: Two times a day (BID) | INTRAVENOUS | 0 refills | Status: AC

## 2022-05-09 MED ORDER — SILODOSIN 8 MG PO CAPS: 8.0000 mg | ORAL_CAPSULE | Freq: Every day | ORAL | 1 refills | Status: AC

## 2022-05-09 NOTE — Progress Notes (Signed)
Physical Therapy Treatment Patient Details Name: [name] MRN: [id number] DOB: 09-13-1941 Today's Date: 05/09/2022   History of Present Illness 81 y.o. male adm 5/11 with edema and orthopnea with acute on chronic CHF and COPD. 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 5/13 MRI with small SAH vs meningitis. 5/14 respiratory distress and LP with bacterial meningitis. vocal cord paralysis. G tube placed 5/23. PMhx: COPD on O2, HFrEF, DM2, CAD s/p CABG, depression, macular degeneration, HTN, HLD    PT Comments    PT pleasant and able to progress standing and gait tolerance. Continued need for assist with all transfers and gait with unsteady balance still present. Pt educated for progression with sit to stands as required 60 sec last trial and now 30 sec. Pt continues to require 6L for gait with sats 93% and 3L at rest with sats 95%. Will continue to follow.   HR 117    Recommendations for follow up therapy are one component of a multi-disciplinary discharge planning process, led by the attending physician.  Recommendations may be updated based on patient status, additional functional criteria and insurance authorization.  Follow Up Recommendations  Skilled nursing-short term rehab (<3 hours/day)     Assistance Recommended at Discharge Frequent or constant Supervision/Assistance  Patient can return home with the following Assistance with cooking/housework;Assist for transportation;Help with stairs or ramp for entrance;A little help with walking and/or transfers;A little help with bathing/dressing/bathroom   Equipment Recommendations  Rolling walker (2 wheels);BSC/3in1    Recommendations for Other Services       Precautions / Restrictions Precautions Precautions: Fall;Other (comment) Precaution Comments: peg     Mobility  Bed Mobility               General bed mobility comments: on BSC on arrival and chair end of session     Transfers Overall transfer level: Needs assistance   Transfers: Sit to/from Stand, Bed to chair/wheelchair/BSC Sit to Stand: Min guard Stand pivot transfers: Min guard         General transfer comment: cues for hand placement with assist for lines and safety    Ambulation/Gait Ambulation/Gait assistance: Min assist Gait Distance (Feet): 240 Feet Assistive device: Rolling walker (2 wheels) Gait Pattern/deviations: Step-through pattern, Decreased stride length, Trunk flexed   Gait velocity interpretation: 1.31 - 2.62 ft/sec, indicative of limited community ambulator   General Gait Details: cues for posture, proximity to RW and min assist for balance as pt with 2 partial LOB during gait on 6L with sats 93% without consistent pleth and able to return to 3L at rest with 95%   Stairs             Wheelchair Mobility    Modified Rankin (Stroke Patients Only)       Balance Overall balance assessment: Needs assistance   Sitting balance-Leahy Scale: Good Sitting balance - Comments: static sitting without assist at bed and toilet   Standing balance support: During functional activity, Reliant on assistive device for balance Standing balance-Leahy Scale: Poor Standing balance comment: Relaint on bilateral UE support with RW in standing                            Cognition Arousal/Alertness: Awake/alert Behavior During Therapy: WFL for tasks assessed/performed Overall Cognitive Status: Impaired/Different from baseline  Safety/Judgement: Decreased awareness of deficits              Exercises General Exercises - Lower Extremity Long Arc Quad: Both, Seated, AROM, 20 reps Hip Flexion/Marching: Both, Seated, AROM, 20 reps Other Exercises Other Exercises: pt performed 5 repeated sit to stands with reliance on armrests in 30 sec    General Comments        Pertinent Vitals/Pain Pain Assessment Pain Assessment:  No/denies pain    Home Living                          Prior Function            PT Goals (current goals can now be found in the care plan section) Progress towards PT goals: Progressing toward goals    Frequency    Min 3X/week      PT Plan Current plan remains appropriate    Co-evaluation              AM-PAC PT "6 Clicks" Mobility   Outcome Measure  Help needed turning from your back to your side while in a flat bed without using bedrails?: A Little Help needed moving from lying on your back to sitting on the side of a flat bed without using bedrails?: A Little Help needed moving to and from a bed to a chair (including a wheelchair)?: A Little Help needed standing up from a chair using your arms (e.g., wheelchair or bedside chair)?: A Little Help needed to walk in hospital room?: A Little Help needed climbing 3-5 steps with a railing? : Total 6 Click Score: 16    End of Session Equipment Utilized During Treatment: Oxygen Activity Tolerance: Patient tolerated treatment well Patient left: with call bell/phone within reach;in chair;with chair alarm set Nurse Communication: Mobility status PT Visit Diagnosis: Other abnormalities of gait and mobility (R26.89);Difficulty in walking, not elsewhere classified (R26.2);Muscle weakness (generalized) (M62.81);Pain     Time: 9562-1308 PT Time Calculation (min) (ACUTE ONLY): 30 min  Charges:  $Gait Training: 8-22 mins $Therapeutic Exercise: 8-22 mins                     [name] P, PT Acute Rehabilitation Services Pager: [phone] Office: [place] 05/09/2022, 10:39 AM

## 2022-05-09 NOTE — Discharge Instructions (Signed)
Advised to follow-up with primary care physician in 1 week. Advised to continue ceftriaxone for 3 more days to complete course for H. influenzae meningitis. Continue medications through PEG tube.

## 2022-05-09 NOTE — Plan of Care (Signed)

## 2022-05-09 NOTE — Progress Notes (Addendum)
Occupational Therapy Treatment Patient Details Name: [name] MRN: [id number] DOB: 1940-12-29 Today's Date: 05/09/2022   History of present illness 81 y.o. male adm 5/11 with edema and orthopnea with acute on chronic CHF and COPD. 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 5/13 MRI with small SAH vs meningitis. 5/14 respiratory distress and LP with bacterial meningitis. vocal cord paralysis. G tube placed 5/23. PMhx: COPD on O2, HFrEF, DM2, CAD s/p CABG, depression, macular degeneration, HTN, HLD   OT comments  Pt was seen brief OT session today with focus on functional mobility/transfers from bed to 3:1 and activity tolerance. Pt is overall Min guard assist for SPT from EOB<>3:1. Pt is slow to process information but follows 1 step commands consistently given increased time. He is very soft spoken. Pt states that he plans to d/c later today to SNF Rehab if medically cleared.   Recommendations for follow up therapy are one component of a multi-disciplinary discharge planning process, led by the attending physician.  Recommendations may be updated based on patient status, additional functional criteria and insurance authorization.    Follow Up Recommendations  Skilled nursing-short term rehab (<3 hours/day)    Assistance Recommended at Discharge Frequent or constant Supervision/Assistance  Patient can return home with the following  A little help with walking and/or transfers;A little help with bathing/dressing/bathroom;Assistance with cooking/housework;Assist for transportation;Help with stairs or ramp for entrance   Equipment Recommendations  Other (comment) (Defer to next venue)    Recommendations for Other Services      Precautions / Restrictions Precautions Precautions: Fall;Other (comment) Precaution Comments: peg Restrictions Weight Bearing Restrictions: No       Mobility Bed Mobility   General bed mobility comments: Pt  sitting up at EOB upon OT arrival.    Transfers Overall transfer level: Needs assistance Equipment used: Rolling walker (2 wheels) Transfers: Sit to/from Stand, Bed to chair/wheelchair/BSC Sit to Stand: Min guard Stand pivot transfers: Min guard   Step pivot transfers: Min guard   General transfer comment: cues for hand placement with assist for lines and safety     Balance Overall balance assessment: Needs assistance Sitting-balance support: No upper extremity supported, Feet supported Sitting balance-Leahy Scale: Good Sitting balance - Comments: static sitting without assist at bed and toilet   Standing balance support: During functional activity, Reliant on assistive device for balance Standing balance-Leahy Scale: Poor Standing balance comment: Relaint on bilateral UE support with RW in standing     ADL either performed or assessed with clinical judgement   ADL Overall ADL's : Needs assistance/impaired   Upper Body Bathing: Set up;Sitting   Toilet Transfer: Min guard;Rolling walker (2 wheels);BSC/3in1;Stand-pivot;Squat-pivot Toilet Transfer Details (indicate cue type and reason): From EOB to 3:1 Toileting- Clothing Manipulation and Hygiene: Moderate assistance;Sit to/from stand   General ADL Comments: Pt was seen brief OT session today with focus on functional mobility/transfers from bed to 3:1 and activity tolerance. Pt is overall Min guard assist for SPT from EOB<>3:1. Pt is slow to process information but follows 1 step commands consistently given increased time. He is very soft spoken. Pt states that he plans to d/c later today to SNF Rehab if medically cleared.    Extremity/Trunk Assessment Upper Extremity Assessment Upper Extremity Assessment: Generalized weakness;Defer to OT evaluation   Lower Extremity Assessment Lower Extremity Assessment: Defer to PT evaluation   Cervical / Trunk Assessment Cervical / Trunk Assessment: Kyphotic    Vision Baseline  Vision/History:  1 Wears glasses Ability to See in Adequate Light: 0 Adequate Patient Visual Report: No change from baseline Vision Assessment?: No apparent visual deficits          Cognition Arousal/Alertness: Awake/alert Behavior During Therapy: WFL for tasks assessed/performed Overall Cognitive Status: Impaired/Different from baseline   Safety/Judgement: Decreased awareness of deficits   Problem Solving: Slow processing                General Comments  Pt and his spouse report that he is hoping to transfer to SNF Rehab later today if medically cleared.    Pertinent Vitals/ Pain       Pain Assessment Pain Assessment: No/denies pain  Home Living Family/patient expects to be discharged to:: Private residence Living Arrangements: Spouse/significant other Available Help at Discharge: Family;Available 24 hours/day Type of Home: House Home Access: Stairs to enter [employer] of Steps: 5   Home Layout: Two level;1/2 bath on main level;Bed/bath upstairs Alternate Level Stairs-Number of Steps: 14   Bathroom Shower/Tub: Occupational psychologist: Standard     Home Equipment: None   Additional Comments: pt has couch and half bath downstairs. 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 Please refer to OT initial Eval for details on PLOF   Frequency  Min 2X/week        Progress Toward Goals  OT Goals(current goals can now be found in the care plan section)  Progress towards OT goals: Progressing toward goals  Acute Rehab OT Goals Patient Stated Goal: Go to SNF Rehab later today OT Goal Formulation: With patient Time For Goal Achievement: 05/10/22 Potential to Achieve Goals: Good  Plan Discharge plan remains appropriate       AM-PAC OT "6 Clicks" Daily Activity     Outcome Measure   Help from another person eating meals?: A Little Help from another person  taking care of personal grooming?: A Little Help from another person toileting, which includes using toliet, bedpan, or urinal?: A Little Help from another person bathing (including washing, rinsing, drying)?: A Lot Help from another person to put on and taking off regular upper body clothing?: A Little Help from another person to put on and taking off regular lower body clothing?: A Lot 6 Click Score: 16    End of Session Equipment Utilized During Treatment: Gait belt;Rolling walker (2 wheels);Oxygen  OT Visit Diagnosis: Unsteadiness on feet (R26.81);Muscle weakness (generalized) (M62.81);Other symptoms and signs involving cognitive function;Cognitive communication deficit (R41.841) 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 Pt/family verbalized understanding)   Nurse Communication Other (comment) (Pt on 3:1 and will call when finished. Pt/spouse in room and verbalized understanding to use call bell)        Time: 0017-4944 OT Time Calculation (min): 12 min  Charges: OT General Charges $OT Visit: 1 Visit OT Treatments $Self Care/Home Management : 8-22 mins   Almyra Deforest, OTR/L 05/09/2022, 12:17 PM

## 2022-05-09 NOTE — TOC Transition Note (Signed)
Transition of Care [facility]) - CM/SW Discharge Note   Patient Details  Name: [name] MRN: [id number] Date of Birth: 1941-11-29  [facility]) CM/SW Contact:  Milas Gain, [place] Phone Number: 05/09/2022, 11:52 AM   Clinical Narrative:     Sherron Flemings with [facility] SNF confirmed patient can dc over with peripheral IV and with foley.  Patient will DC to: [place] date: 05/09/2022  Family notified: Hoyle Sauer   Transport by: Corey Harold  ?  Per MD patient ready for DC to [facility] . RN, patient, patient's family, and facility notified of DC. Discharge Summary sent to facility. RN given number for report tele# [phone] RM#[phone]. DC packet on chart.DNR signed by MD attached to patients DC packet. Ambulance transport requested for patient.  CSW signing off.    Final next level of care: Skilled Nursing Facility Barriers to Discharge: No Barriers Identified   Patient Goals and CMS Choice Patient states their goals for this hospitalization and ongoing recovery are:: SNF CMS Medicare.gov Compare Post Acute Care list provided to:: Patient Represenative (must comment) (Patients spouse Hoyle Sauer) Choice offered to / list presented to : Spouse  Discharge Placement              Patient chooses bed at: [facility] Patient to be transferred to facility by: [place] Name of family member notified: Hoyle Sauer Patient and family notified of of transfer: 05/09/22  Discharge Plan and Services In-house Referral: Clinical Social Work                                   Social Determinants of Health (SDOH) Interventions     Readmission Risk Interventions     View : No data to display.

## 2022-05-09 NOTE — Discharge Summary (Addendum)
Physician Discharge Summary  AREND BAHL JXB:[id number] DOB: 15-Feb-1941 DOA: 04/25/2022  PCP: Seward Carol, MD  Admit date: 04/25/2022  Discharge date: 05/09/2022  Admitted From: Home.  Disposition:  SNF  Recommendations for Outpatient Follow-up:  Follow up with PCP in 1-2 weeks. Please obtain BMP/CBC in one week. Advised to continue ceftriaxone for 3 more days to complete course for H. influenzae meningitis. Continue medications through PEG tube. Patient is being discharged with foley catheter, consider voiding trial   Home Health:None Equipment/Devices: PEG tube, Foley catheter, Peripheral IV line   Discharge Condition: Stable CODE STATUS:  DNR Diet recommendation:  PEG tube feeds  Brief Summary/ Hospital course: This 81 y.o. 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 He was admitted for acute on chronic hypoxic respiratory failure with suspicion of acute CHF and/or pneumonia with subsequent CTA chest showing no PE. He was afebrile with leukocytosis and low back pain in addition to his chronic neck pain. Imaging revealed a presacral density, adrenal mass, upper normal sized mediastinal lymph nodes. CT head showed nonspecific 35m hyperdense focus if periventricular parietal white matter for which subsequent MRI was ordered. This was severely motion degraded and limited, but revealed abnormal diffusion signal abnormality layering within the occipital horns of lateral ventricles and Sylvian fissure suggestive of SAH vs. meningitis. Neurology was consulted, performed LP which is consistent with bacterial meningitis. 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 Patient continued on IV antibiotics for H  influenza meningitis and he will complete antibiotics on 05/12/2022.  Patient has PEG tube for feeding and medications.  Patient has developed urinary retention requiring Foley catheter.  Patient will follow-up with urology.  Patient is doing better [place] [facility] [place] [facility] [place] [place]  Discharge Diagnoses:  Principal Problem:   Acute on chronic respiratory failure with hypoxia (HCC) Active Problems:   Acute on chronic systolic (congestive) heart failure (HCC)   COPD with acute exacerbation (HCC)   Presacral mass   Adrenal mass (HCC)   Neck mass   Tremor   Diabetes mellitus type II, non insulin dependent (HCC)   Ventriculitis of brain due to bacteria   Meningitis   [place]vere [place]   Dysphonia   Paresis of right vocal cord   Acute on chronic hypoxic respiratory failure: > Improving. Presumably due to aspiration pneumonia /atelectasis/edema CTA chest without PE. Continue supplemental oxygen and wean as tolerated. Goal SPO2 90 to 92%. Continue home inhalers as needed. Encourage incentive spirometry/flutter as tolerated.   H influenza bacterial meningitis,  POA: Concurrent metabolic Encephalopathy/rule out hospital delirium. Lumbar puncture confirms bacterial meningitis. Negative gram stain, Neutrophils severely elevated with low glucose, high protein on LP 5/14.  Infectious disease consulted.  Patient will complete ceftriaxone 2 g every 12 hours on 05/12/2022 Discontinue droplet precautions. Household contacts over the past 7 days have been given chemoprophylaxis on 5/14.  No further needs in this regard per multiple physicians.   Dysphagia in the setting of recent CVA and vocal cord dysfunction: Patient had cortrak in place, patient had PEG tube insertion by IR 5/23. Cortrak removed.  Continue feeds through PEG tube.   Acute kidney injury: Resolved  Likely multifactorial.   Renal functions improved.   Acute  urinary retention: Concurrent  hematuria > resolved. Continue Foley catheter, urine output/color improving. Urology following.  Appreciate recommendation. Foley catheter inserted.  Flomax changed to Rapaflo to be given through PEG tube. Patient needs follow-up with urology Dr. Rexene Alberts. Consider voiding trial at SNF.   Severe back pain > improved. Likely in the setting of meningitis. Continue tylenol, robaxin, heating pad -Dilaudid for breakthrough pain given in the hospital.   Acute on chronic systolic CHF: -LVEF 09-38%, based on more recent echo, LVEF has improved to 50-55%.  cardiology consulted.  Resume Lasix as needed. Continue metoprolol    Acute CVA: MRI showed 4 mm diffusion signal abnormality involving the left posterior frontotemporal region Continue aspirin 81 mg daily.  Resume medications through PEG tube.   CAD s/p CABG:  Troponin slightly elevated could be due to demand ischemia due to sepsis. ACS less likely. Continue aspirin, metoprolol, statin Cardiology consulted, no changes to management or ischemic work up currently planned.   Multifocal pneumonia: CTA showed increasing consolidation with respiratory deterioration. Continue antibiotics as above. Monitor blood cultures drawn at admission (unchanged 1 of 4 S. anginosis and other is NGTD x 5 days)   Presacral mass: Indeterminate, incidental. Not rim-enhancing. Discussed with IR, suggests PET-CT prior to selecting a target for biopsy in the outpatient setting.   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 Renin and aldosterone pending Cortisol is 49.2. Metanephrines negative. Needs PET to delineate this as well as presacral density on CT.    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   Tremor:  Suspect this is due to CNS infection. Not consistent with seizure. improving   NIDT2DM: HbA1c 6.5%, controlled.  Continue moderate SSI, hold metformin with contrast exposures.      Obesity:  Estimated body mass index is 31.39  kg/m as calculated from the following:   Height as of this encounter: '5\' 6"'  (1.676 m).   Weight as of this encounter: 88.2 kg.    Diet and Exercise discussed.    Discharge Instructions  Discharge Instructions     [facility] pharmacist to adjust dose for Vancomycin, Aminoglycosides and other anti-infective therapies as requested by physician.   Complete by: As directed    [facility] to provide Cath Flo 27m   Complete by: As directed    Administer for PICC line occlusion and as ordered by physician for other access device issues.   Anaphylaxis Kit: Provided to treat any anaphylactic reaction to the medication being provided to the patient if First Dose or when requested by physician   Complete by: As directed    Epinephrine 162mml vial / amp: Administer 0.34m67m0.34ml40mubcutaneously once for moderate to severe anaphylaxis, nurse to call physician and pharmacy when reaction occurs and call 911 if needed for immediate care   Diphenhydramine 50mg40mIV vial: Administer 25-50mg 58mM PRN for first dose reaction, rash, itching, mild reaction, nurse to call physician and pharmacy when reaction occurs   Sodium Chloride 0.9% NS 500ml I37mdminister if needed for hypovolemic blood pressure drop or as ordered by physician after call to physician with anaphylactic reaction   Call MD for:  difficulty breathing, headache or visual disturbances   Complete by: As directed    Call MD for:  persistant dizziness or light-headedness   Complete by: As directed    Call MD for:  persistant nausea and vomiting   Complete by: As directed    Change dressing on IV  access line weekly and PRN   Complete by: As directed    Diet - low sodium heart healthy   Complete by: As directed    Diet Carb Modified   Complete by: As directed    Discharge instructions   Complete by: As directed    Advised to follow-up with primary care physician in 1 week. Advised to continue ceftriaxone for 3 more  days to complete course for H. influenzae meningitis. Continue medications through PEG tube.   Discharge wound care:   Complete by: As directed    Wound care at SNF   Flush IV access with Sodium Chloride 0.9% and Heparin 10 units/ml or 100 units/ml   Complete by: As directed    Home infusion instructions - [facility]   Complete by: As directed    Instructions: Flush IV access with Sodium Chloride 0.9% and Heparin 10units/ml or 100units/ml   Change dressing on IV access line: Weekly and PRN   Instructions Cath Flo 62m: Administer for PICC Line occlusion and as ordered by physician for other access device   [facility] pharmacist to adjust dose for: Vancomycin, Aminoglycosides and other anti-infective therapies as requested by physician   Increase activity slowly   Complete by: As directed    Increase activity slowly   Complete by: As directed    Method of administration may be changed at the discretion of home infusion pharmacist based upon assessment of the patient and/or caregiver's ability to self-administer the medication ordered   Complete by: As directed    No wound care   Complete by: As directed       Allergies as of 05/09/2022   No Known Allergies      Medication List     STOP taking these medications    acetaminophen 500 MG tablet Commonly known as: TYLENOL Replaced by: acetaminophen 160 MG/5ML solution       TAKE these medications    acetaminophen 160 MG/5ML solution Commonly known as: TYLENOL Place 20.3 mLs (650 mg total) into feeding tube every 4 (four) hours as needed for headache or mild pain. Replaces: acetaminophen 500 MG tablet   albuterol 108 (90 Base) MCG/ACT inhaler Commonly known as: VENTOLIN HFA Inhale 1-2 puffs into the lungs every 6 (six) hours as needed for wheezing or shortness of breath. What changed:  how much to take when to take this   amLODipine 10 MG tablet Commonly known as: NORVASC Place 1 tablet (10 mg  total) into feeding tube daily. What changed: how to take this   atorvastatin 40 MG tablet Commonly known as: LIPITOR Place 1 tablet (40 mg total) into feeding tube daily. What changed: how to take this   cefTRIAXone  IVPB Commonly known as: ROCEPHIN Inject 2 g into the vein every 12 (twelve) hours for 3 days. Indication:  Meningitis First Dose: Yes Last Day of Therapy:  05/12/22 Labs - Once weekly:  CBC/D and BMP, Labs - Every other week:  ESR and CRP Method of administration: IV Push Method of administration may be changed at the discretion of home infusion pharmacist based upon assessment of the patient and/or caregiver's ability to self-administer the medication ordered.   Debrox 6.5 % OTIC solution Generic drug: carbamide peroxide Place 5 drops into both ears daily as needed (wax buildup).   dorzolamide 2 % ophthalmic solution Commonly known as: TRUSOPT Place 1 drop into both eyes 2 (two) times daily.   feeding supplement (GLUCERNA 1.5 CAL) Liqd Place 1,000  mLs into feeding tube continuous.   furosemide 40 MG tablet Commonly known as: LASIX Place 1-2 tablets (40-80 mg total) into feeding tube daily as needed for edema or fluid. What changed: how to take this   metoprolol succinate 25 MG 24 hr tablet Commonly known as: TOPROL-XL Take 1 tablet (25 mg total) by mouth daily.   multivitamin capsule Take 1 capsule by mouth daily.   silodosin 8 MG Caps capsule Commonly known as: RAPAFLO Place 1 capsule (8 mg total) into feeding tube daily with supper. Notes to patient: Capsule may be opened and the powder sprinkled into a small amount of applesauce.               Discharge Care Instructions  (From admission, onward)           Start     Ordered   05/09/22 0000  Change dressing on IV access line weekly and PRN  (Home infusion instructions - [facility] )        05/09/22 1033   05/09/22 0000  Discharge wound care:       Comments: Wound care at [facility]    05/09/22 1302            Follow-up Information     Seward Carol, MD Follow up in 1 week(s).   Specialty: Internal Medicine Contact information: [address] [place] [address] [phone]         Sueanne Margarita, MD .   Specialty: Cardiology Contact information: [phone] [place] [address] [phone]                No Known Allergies  Consultations: Neurology Cardiology Infectious disease Urology  Procedures/Studies: CT ANGIO HEAD NECK W WO CM  Result Date: 04/28/2022 CLINICAL DATA:  Subarachnoid hemorrhage follow-up. EXAM: CT ANGIOGRAPHY HEAD AND NECK TECHNIQUE: Multidetector CT imaging of the head and neck was performed using the standard protocol during bolus administration of intravenous contrast. Multiplanar CT image reconstructions and MIPs were obtained to evaluate the vascular anatomy. Carotid stenosis measurements (when applicable) are obtained utilizing NASCET criteria, using the distal internal carotid diameter as the denominator. RADIATION DOSE REDUCTION: This exam was performed according to the departmental dose-optimization program which includes automated exposure control, adjustment of the mA and/or kV according to patient size and/or use of iterative reconstruction technique. CONTRAST:  69m OMNIPAQUE IOHEXOL 350 MG/ML SOLN COMPARISON:  04/25/2022 FINDINGS: CTA NECK FINDINGS Aortic arch: Atheromatous plaque which is extensive. Two vessel branching. Right carotid system: Diffuse atheromatous plaque affecting the common carotid and proximal ICA. No flow limiting stenosis, beading, or ulceration. Left carotid system: Diffuse atheromatous plaque especially affecting the common and proximal internal carotid arteries. No flow limiting stenosis or ulceration. Vertebral arteries: Proximal subclavian atherosclerosis on both sides. The left vertebral artery is dominant. Proximal left vertebral artery kink from  tortuosity. Diminutive right vertebral artery which is not seen proximally but is flowing from muscular branches of the distal V2 segment. Skeleton: No acute osseous finding or aggressive bone lesion. Ventral spinal canal gas collection at C3-4 and to a smaller extent at C4-5, usually herniation containing vacuum phenomenon. 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 Upper chest: Emphysema. Pulmonary opacity better assessed on recent chest CT. Review of the MIP images confirms the above findings CTA HEAD FINDINGS Anterior circulation: Extensive atheromatous plaque along the carotid siphons. 50% narrowing at the right anterior genu. No branch occlusion, beading, or aneurysm. Posterior circulation: Strong left  vertebral artery dominance with minimal contribution of the right vertebral artery to the basilar. The vertebral and basilar arteries are diffusely patent. No PCA branch occlusion, beading, or aneurysm Venous sinuses: Diffusely patent Anatomic variants: As above Review of the MIP images confirms the above findings IMPRESSION: 1. No emergent finding. No aneurysm or vascular malformation as a cause of subarachnoid findings. 2. No flow seen in the non dominant proximal right vertebral artery with distal reconstitution. 3. Diffuse atherosclerosis with up to 50% narrowing of the right intracranial ICA. 4. Gas in the ventral canal at C3-4 without evidence of underlying acute osseous disease, usually gas containing herniation. 5. 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 Electronically Signed   By: Jorje Guild M.D.   On: 04/28/2022 08:51   DG Abd 1 View  Result Date: 05/07/2022 CLINICAL DATA:  Evaluation of distribution of oral contrast prior to possible gastrostomy tube placement. EXAM: ABDOMEN - 1 VIEW COMPARISON:  05/03/2022 FINDINGS: Feeding tube continues to extend into the proximal duodenum. Contrast is present in the colon as well as air outlining the transverse colon, splenic flexure, descending colon, sigmoid  colon and rectum. The colon is not dilated. There is no evidence of bowel obstruction or significant ileus. In the projection of the x-ray, the transverse colon overlies the course of the feeding tube within the stomach. IMPRESSION: Contrast within the colon. No evidence of bowel obstruction or significant ileus. The transverse colon overlies the course of the feeding tube in the stomach in the projection of the x-ray. Electronically Signed   By: Aletta Edouard M.D.   On: 05/07/2022 09:09   CT Head W or Wo Contrast  Result Date: 04/25/2022 CLINICAL DATA:  Brain metastases suspected. EXAM: CT HEAD WITHOUT AND WITH CONTRAST TECHNIQUE: Contiguous axial images were obtained from the base of the skull through the vertex without and with intravenous contrast. RADIATION DOSE REDUCTION: This exam was performed according to the departmental dose-optimization program which includes automated exposure control, adjustment of the mA and/or kV according to patient size and/or use of iterative reconstruction technique. CONTRAST:  37m OMNIPAQUE IOHEXOL 300 MG/ML  SOLN COMPARISON:  None Available. FINDINGS: Brain: There is residual contrast material from today's earlier CT of the chest, abdomen, and pelvis. A 2 mm hyperdense focus in the right parietal periventricular white matter on both the noncontrast and postcontrast head CT images is indeterminate for an enhancing lesion, focus of calcification, punctate hemorrhage, or vascular structure (series 2, image 18). No enhancing intracranial lesion is identified elsewhere. There is no evidence of an acute infarct, mass, midline shift, or extra-axial fluid collection. Patchy and confluent hypodensities in the cerebral white matter bilaterally are nonspecific but compatible with moderately severe chronic small vessel ischemic disease. The ventricles and sulci are within normal limits for age. Vascular: Calcified atherosclerosis at the skull base. Grossly patent dural venous  sinuses. Skull: No fracture or suspicious osseous lesion. Sinuses/Orbits: Tiny mucous retention cyst in the left maxillary sinus. Clear mastoid air cells. Bilateral cataract extraction. Other: None. IMPRESSION: 1. 2 mm hyperdense focus in the right parietal periventricular white matter, indeterminate for a tiny metastasis, focus of calcification, punctate hemorrhage, or vascular structure with assessment limited by residual contrast from today's earlier body CT. Head MRI without and with contrast is recommended for further evaluation. 2. 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 Electronically Signed   By: ALogan BoresM.D.   On: 04/25/2022 16:07   CT Angio Chest Pulmonary Embolism (PE) W or WO Contrast  Result Date:  04/27/2022 CLINICAL DATA:  Positive D-dimer. Clinical suspicion for pulmonary embolus. EXAM: CT ANGIOGRAPHY CHEST WITH CONTRAST TECHNIQUE: Multidetector CT imaging of the chest was performed using the standard protocol during bolus administration of intravenous contrast. Multiplanar CT image reconstructions and MIPs were obtained to evaluate the vascular anatomy. RADIATION DOSE REDUCTION: This exam was performed according to the departmental dose-optimization program which includes automated exposure control, adjustment of the mA and/or kV according to patient size and/or use of iterative reconstruction technique. CONTRAST:  66m OMNIPAQUE IOHEXOL 350 MG/ML SOLN COMPARISON:  04/25/2022 standard chest CT. FINDINGS: Cardiovascular: Heart is enlarged. No substantial pericardial effusion. Coronary artery calcification is evident. Status post CABG. Moderate atherosclerotic calcification is noted in the wall of the thoracic aorta. There is no filling defect within the opacified pulmonary arteries to suggest the presence of an acute pulmonary embolus. Mediastinum/Nodes: No mediastinal lymphadenopathy. There is no hilar lymphadenopathy. The esophagus has normal imaging features. There is no  axillary lymphadenopathy. Lungs/Pleura: Centrilobular emphsyema noted. Bilateral dependent lower lobe collapse/consolidation, left slightly more than right. Interlobular septal thickening noted in the upper lobes bilaterally no substantial pleural effusion. Upper Abdomen: Layering tiny calcified gallstones evident. Numerous tiny hypodensities in the liver parenchyma are too small to characterize, better evaluated on recent dedicated abdomen CT. Musculoskeletal: No worrisome lytic or sclerotic osseous abnormality. Review of the MIP images confirms the above findings. IMPRESSION: 1. No CT evidence for acute pulmonary embolus. 2. Bilateral dependent lower lobe collapse/consolidation, left slightly more than right. 3. Interlobular septal thickening in the upper lobes bilaterally, suggesting pulmonary edema. 4. Cholelithiasis. 5. Aortic Atherosclerosis (ICD10-I70.0) and Emphysema (ICD10-J43.9). Electronically Signed   By: EMisty StanleyM.D.   On: 04/27/2022 12:45   MR BRAIN WO CONTRAST  Result Date: 04/28/2022 CLINICAL DATA:  Initial evaluation for suspected brain metastases. EXAM: MRI HEAD WITHOUT CONTRAST TECHNIQUE: Multiplanar, multiecho pulse sequences of the brain and surrounding structures were obtained without intravenous contrast. COMPARISON:  Prior CT from 04/25/2022. FINDINGS: Brain: Examination severely limited as the patient was unable to tolerate the full length of the exam. Axial coronal DWI sequence and axial FLAIR sequence only were performed. Additionally, provided images are severely degraded by motion. Age-related cerebral atrophy. Patchy and confluent T2/FLAIR hyperintensity involving the periventricular and deep white matter, most likely related chronic microvascular ischemic disease, moderate to severe in nature. Punctate 4 mm focus of diffusion signal abnormality seen involving the posterior left frontotemporal region, possibly reflecting a small ischemic infarct (series 2, image 29). No other  convincing diffusion abnormality to suggest acute or subacute ischemia on this limited exam. Gray-white matter differentiation otherwise grossly maintained. Abnormal diffusion signal abnormality seen layering within the occipital horns of both lateral ventricles (series 2, image 24). Additional diffusion signal abnormality seen at the left sylvian fissure (series 2, image 26). Findings are nonspecific, but suspicious for possible subarachnoid hemorrhage. Possible proteinaceous material/debris would be the primary differential consideration. Ventricles are relatively stable in size without progressive hydrocephalus. No mass lesion, mass effect, or midline shift. No extra-axial fluid collection. Vascular: Major intracranial vascular flow voids are grossly maintained at the skull base on this limited exam. Skull and upper cervical spine: Bone marrow signal intensity grossly within normal limits. No appreciable scalp soft tissue abnormality. Sinuses/Orbits: Globes and orbital soft tissues grossly within normal limits. Paranasal sinuses are largely clear. No significant mastoid effusion. Other: None. IMPRESSION: 1. Technically limited exam due to patient's inability to tolerate the full length of the study and extensive motion artifact. 2. Abnormal small  volume diffusion signal abnormality layering within the occipital horns of both lateral ventricles as well as the left Sylvian fissure. Findings are nonspecific, but suspicious for possible small volume subarachnoid hemorrhage. Possible proteinaceous material/debris (as in the setting of CNS infection) would be the primary differential consideration. Correlation with laboratory values recommended. Additionally, further evaluation with LP and CSF analysis could be performed for further evaluation as warranted. 3. 4 mm focus of diffusion signal abnormality involving the posterior left frontotemporal region, suspicious for a small acute ischemic infarct. 4. Age-related  cerebral atrophy with moderate to severe chronic microvascular ischemic disease. Critical Value/emergent results were called by telephone at the time of interpretation on 04/28/2022 at 4:15 am to provider [facility] , who verbally acknowledged these results. Electronically Signed   By: Jeannine Boga M.D.   On: 04/28/2022 04:21   IR GASTROSTOMY TUBE MOD SED  Result Date: 05/07/2022 INDICATION: Acute on chronic respiratory failure, hypoxia, dysphagia EXAM: TWENTY FRENCH FLUOROSCOPIC 24 PULL-THROUGH GASTROSTOMY Date:  05/07/2022 05/07/2022 12:53 pm Radiologist:  Jerilynn Mages. Daryll Brod, MD Guidance:  Fluoroscopic MEDICATIONS: Ancef 2 g; Antibiotics were administered within 1 hour of the procedure. Glucagon 0.5 mg IV ANESTHESIA/SEDATION: Versed 1.0 mg IV; Fentanyl 50 mcg IV Moderate Sedation Time:  15 minutes The patient was continuously monitored during the procedure by the interventional radiology nurse under my direct supervision. CONTRAST:  10 cc-administered into the gastric lumen. FLUOROSCOPY: Fluoroscopy Time: 2 minutes 48 seconds (115 mGy). COMPLICATIONS: None immediate. PROCEDURE: Informed consent was obtained from the patient following explanation of the procedure, risks, benefits and alternatives. The patient understands, agrees and consents for the procedure. All questions were addressed. A time out was performed. Maximal barrier sterile technique utilized including caps, mask, sterile gowns, sterile gloves, large sterile drape, hand hygiene, and betadine prep. The left upper quadrant was sterilely prepped and draped. An oral gastric catheter was inserted into the stomach under fluoroscopy. The existing nasogastric feeding tube was removed. Air was injected into the stomach for insufflation and visualization under fluoroscopy. The air distended stomach was confirmed beneath the anterior abdominal wall in the frontal and lateral projections. Under sterile conditions and local anesthesia, a 61 gauge trocar  needle was utilized to access the stomach percutaneously beneath the left subcostal margin. Needle position was confirmed within the stomach under biplane fluoroscopy. Contrast injection confirmed position also. A single T tack was deployed for gastropexy. Over an Amplatz guide wire, a 9-French sheath was inserted into the stomach. A snare device was utilized to capture the oral gastric catheter. The snare device was pulled retrograde from the stomach up the esophagus and out the oropharynx. The 20-French pull-through gastrostomy was connected to the snare device and pulled antegrade through the oropharynx down the esophagus into the stomach and then through the percutaneous tract external to the patient. The gastrostomy was assembled externally. Contrast injection confirms position in the stomach. Images were obtained for documentation. The patient tolerated procedure well. No immediate complication. IMPRESSION: Fluoroscopic insertion of a 20-French "pull-through" gastrostomy. Electronically Signed   By: Jerilynn Mages.  Shick M.D.   On: 05/07/2022 13:09   CT CHEST ABDOMEN PELVIS W CONTRAST  Result Date: 04/25/2022 CLINICAL DATA:  Abdominal pain, congestion, fever, confusion, vomiting. History CHF, obesity, COPD, diabetes mellitus, hypertension, former smoker EXAM: CT CHEST, ABDOMEN, AND PELVIS WITH CONTRAST TECHNIQUE: Multidetector CT imaging of the chest, abdomen and pelvis was performed following the standard protocol during bolus administration of intravenous contrast. RADIATION DOSE REDUCTION: This exam was performed according to the  departmental dose-optimization program which includes automated exposure control, adjustment of the mA and/or kV according to patient size and/or use of iterative reconstruction technique. CONTRAST:  118m OMNIPAQUE IOHEXOL 300 MG/ML SOLN IV. No oral contrast. COMPARISON:  None FINDINGS: CT CHEST FINDINGS Cardiovascular: Atherosclerotic calcifications aorta, proximal great vessels, and  coronary arteries. Post CABG. Aorta normal caliber. Enlargement of cardiac chambers. No pericardial effusion. Pulmonary arteries grossly patent on non targeted exam. Mediastinum/Nodes: Esophagus unremarkable. Base of cervical region normal appearance. Upper normal sized mediastinal lymph nodes. No thoracic adenopathy. Lungs/Pleura: Emphysematous changes. Peribronchial thickening. Minimal dependent atelectasis in lower lobes and base of lingula. No acute infiltrate, pleural effusion, or pneumothorax. Musculoskeletal: No acute osseous findings. CT ABDOMEN PELVIS FINDINGS Hepatobiliary: Tiny low-attenuation foci within liver up to 10 mm, a few likely representing small cysts, remainder indeterminate. Dependent gallstones in gallbladder. No biliary dilatation. Pancreas: Normal appearance Spleen: Normal appearance.  Small splenule. Adrenals/Urinary Tract: LEFT adrenal gland normal. RIGHT adrenal mass 4.2 x 4.1 cm image 60, 39 HU on portal venous phase, 36 HU on delayed images. Two LEFT renal cysts, larger 3.1 x 2.8 cm; no follow-up imaging recommended. Kidneys and ureters otherwise normal appearance. Mild nonspecific stranding in perinephric fat planes. Dependent calculi within urinary bladder. Small anterior diverticulum at superior bladder. Stomach/Bowel: Extensive distal colonic diverticulosis without evidence of diverticulitis. Normal appendix. Small hiatal hernia. Stomach and bowel loops otherwise normal appearance. Vascular/Lymphatic: Extensive atherosclerotic calcifications aorta and iliac arteries. Significant atherosclerotic calcifications at the origins of the visceral arteries. Aorta normal caliber. 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 No adenopathy. Reproductive: Minimal prostatic enlargement. Seminal vesicles unremarkable Other: No free air or free fluid. Intermediate attenuation mass identified dorsal and RIGHT lateral to the rectum in the presacral space, 7.0 x  4.6 x 6.1 cm. No hernia. Musculoskeletal: Osseous structures demineralized. IMPRESSION: Intermediate attenuation mass presacral space 7.0 x 4.6 x 6.1 cm, nonspecific, uncertain etiology. Differential diagnosis would include complicated rectal duplication cyst, metastatic lesion, teratoma, lymphoma; in light of coexistent RIGHT adrenal mass, consider PET-CT for further evaluation to assess for potential primary tumor and to guide for most accessible site for biopsy. 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 Surgical consultation is recommended, though see above recommendation first. Consider biochemical lab evaluation for functional status and pheochromocytoma prior to resection. JACR 2017 Aug; 14(8):1038-44, JCAT 2016 Mar-Apr; 40(2):194-200, Urol J 2006 Spring; 3(2):71-4. Dependent calculi within urinary bladder. Cholelithiasis. Extensive distal colonic diverticulosis without evidence of diverticulitis. Small hiatal hernia. Aortic Atherosclerosis (ICD10-I70.0) and Emphysema (ICD10-J43.9). Electronically Signed   By: MLavonia DanaM.D.   On: 04/25/2022 13:19   DG CHEST PORT 1 VIEW  Result Date: 04/29/2022 CLINICAL DATA:  Acute respiratory failure with hypoxia EXAM: PORTABLE CHEST 1 VIEW COMPARISON:  Chest radiograph 1 day prior FINDINGS: Median sternotomy wires are unchanged. The cardiomediastinal silhouette is stable, with unchanged calcified atherosclerotic plaque in the aortic arch. Increased interstitial markings are again seen throughout both lungs but worse on the left, overall unchanged. Retrocardiac opacity likely reflecting atelectasis is unchanged. There is no new or worsening focal airspace disease. There is no significant pleural effusion. There is no pneumothorax. There is no acute osseous abnormality. IMPRESSION: Overall no significant interval change in lung aeration since 04/28/2022 with findings again concerning for asymmetric pulmonary edema and left basilar atelectasis.  Electronically Signed   By: PValetta MoleM.D.   On: 04/29/2022 08:23   DG CHEST PORT 1 VIEW  Result Date: 04/28/2022 CLINICAL DATA:  Respiratory distress.  Evaluate pulmonary edema. EXAM: PORTABLE CHEST 1 VIEW COMPARISON:  CT angio chest 04/27/2022 FINDINGS: Status post median sternotomy and CABG procedure. Normal heart size. Aortic atherosclerotic calcifications. Progressive, asymmetric opacification throughout the left lung is identified with increased peripheral septal lines concerning for asymmetric edema. Atelectasis and or airspace disease is noted within the left lung base. IMPRESSION: Progressive, asymmetric opacification throughout the left lung compatible with asymmetric edema. Electronically Signed   By: Kerby Moors M.D.   On: 04/28/2022 05:28   DG Chest Port 1 View  Result Date: 04/25/2022 CLINICAL DATA:  Shortness of breath EXAM: PORTABLE CHEST 1 VIEW COMPARISON:  Previous studies including the examination of 02/03/2019 FINDINGS: Transverse diameter of heart is increased. There is evidence of previous coronary bypass surgery. Patient's chin is partly obscuring the apical regions of both lungs. Left hemidiaphragm is elevated. Central pulmonary vessels are prominent. There are linear densities in both lower lung fields. Left lateral CP angle is indistinct. There is no pneumothorax. IMPRESSION: Cardiomegaly. Central pulmonary vessels are prominent without signs of alveolar pulmonary edema. There are linear densities in both lower lung fields suggesting subsegmental atelectasis/pneumonia. Possible small left pleural effusion. Electronically Signed   By: Elmer Picker M.D.   On: 04/25/2022 11:59   DG Abd Portable 1V  Result Date: 05/03/2022 CLINICAL DATA:  Enteric tube placement. EXAM: PORTABLE ABDOMEN - 1 VIEW COMPARISON:  None Available. FINDINGS: The bowel gas pattern is normal. Distal tip of feeding tube is seen in expected position of distal stomach. IMPRESSION: Distal tip of  feeding tube is seen in expected position of distal stomach. Electronically Signed   By: Marijo Conception M.D.   On: 05/03/2022 10:00   DG Swallowing Func-Speech Pathology  Result Date: 04/29/2022 Table formatting from the original result was not included. Objective Swallowing Evaluation: Type of Study: MBS-Modified Barium Swallow Study  Patient Details Name: RIGOBERTO REPASS MRN: [id number] Date of Birth: 1941-02-15 Today's Date: 04/29/2022 Time: SLP Start Time (ACUTE ONLY): 2536 -SLP Stop Time (ACUTE ONLY): 1610 SLP Time Calculation (min) (ACUTE ONLY): 29 min Past Medical History: Past Medical History: Diagnosis Date  Anxiety   CAD (coronary artery disease)    s/p CABG with LIMA to LAD, SVG to diag, SVG to OM, SVG to RCA cath 2002 Dr. Radford Pax  CAP (community acquired pneumonia) due to Pneumococcus ([place]) 03/03/2017  Cataract   Chronic combined systolic and diastolic CHF (congestive heart failure) ([place]) 04/28/2018  Chronic kidney disease   Claudication ([facility])   normal ABIs  COPD (chronic obstructive pulmonary disease) ([place])   Depression   Diabetes mellitus without complication ([place])   Dyslipidemia   Hypersomnia   Hypertension   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[place])   EF 40% with apical AK at cath intolerant to ACE I and ARBS, EF 43% by nuclear stress test 08/2010  Kidney stones   Macular degeneration   Morbid obesity ([place]) 04/28/2018 Past Surgical History: Past Surgical History: Procedure Laterality Date  CORONARY ARTERY BYPASS GRAFT    with LIMA to LAD, SVG to diag, SVG to OM, SVG to RCA cath 2002 Dr. Radford Pax  urologic procedure for fertility   HPI: 81 y.o. male adm 5/11 with edema and orthopnea with acute on chronic CHF and COPD. Pt also with acute onset dysphonia. 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 MRI 5/13 limited but with concern for CNS infection and possible small acute infarct in  the L frontotemporal region. LP  5/14 + for bacterial meningitis. PMhx: COPD on O2,  HFrEF, DM2, CAD s/p CABG, depression, macular degeneration, HTN, HLD  Subjective: pt sleepy but participating in exam  Recommendations for follow up therapy are one component of a multi-disciplinary discharge planning process, led by the attending physician.  Recommendations may be updated based on patient status, additional functional criteria and insurance authorization. 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 Orally, he has extra lingual manipulation and slow transit of boluses with premature spillage of thinner consistencies and increased lingual residue of thicker ones. Thin and nectar thick liquids that reach the pyriform sinuses before the swallow are aspirated during the swallow in the setting of incomplete airway closure. Honey thick liquids and purees are not as immediately aspirated, but there is more residue at the pyriform sinuses as well as residue within the cervical esophagus/outpouching that backflow into the pyriform sinuses, with small amounts of aspiration that occurs after the swallow when imaging was turned off. Aspiration across consistencies was largely silent (PAS#8) with the exception of aspiration of a larger volume of thin liquids that elicited a throat clear. Pt could not cognitively carry out strategies during this study. Recommend that he remain NPO. In light of current respiratory status and intermittent need for BiPAP, would consider offering meds via alternate route as pharyngeal residue was aspirated during MBS. If MD agrees, could offer one or two ice chips after oral care to provide some moisture and allow for use of swallowing musculature. SLP will continue to follow. May also wish to consider ENT consult given report of acute onset aphonia and frequency of aspiration. SLP Visit  Diagnosis Dysphagia, pharyngoesophageal phase (R13.14);Dysphagia, oropharyngeal phase (R13.12) Impact on safety and function Severe aspiration risk     04/29/2022   4:00 PM Treatment Recommendations Treatment Recommendations Therapy as outlined in treatment plan below     04/29/2022   4:00 PM Prognosis Prognosis for Safe Diet Advancement Good Barriers to Reach Goals Cognitive deficits   04/29/2022   4:00 PM Diet Recommendations SLP Diet Recommendations NPO Medication Administration Via alternative means     04/29/2022   4:00 PM Other Recommendations Recommended Consults Consider ENT evaluation Oral Care Recommendations Oral care QID Follow Up Recommendations -- Assistance recommended at discharge Frequent or constant Supervision/Assistance Functional Status Assessment Patient has had a recent decline in their functional status and demonstrates the ability to make significant improvements in function in a reasonable and predictable amount of time.   04/29/2022   4:00 PM Frequency and Duration  Speech Therapy Frequency (ACUTE ONLY) min 2x/week Treatment Duration 2 weeks     04/29/2022   4:00 PM Oral Phase Oral Phase Impaired Oral - Honey Cup Delayed oral transit;Decreased bolus cohesion;Lingual/palatal residue Oral - Nectar Cup Delayed oral transit;Decreased bolus cohesion;Premature spillage Oral - Nectar Straw Delayed oral transit;Decreased bolus cohesion;Premature spillage Oral - Thin Cup Decreased bolus cohesion;Premature spillage Oral - Thin Straw Decreased bolus cohesion;Premature spillage Oral - Puree Delayed oral transit;Decreased bolus cohesion    04/29/2022   4:00 PM Pharyngeal Phase Pharyngeal Phase Impaired Pharyngeal- Honey Cup Pharyngeal residue - pyriform;Penetration/Apiration after swallow Pharyngeal Material enters airway, passes BELOW cords without attempt by patient to eject out (silent aspiration) Pharyngeal- Nectar Cup Delayed swallow initiation-pyriform sinuses;Penetration/Aspiration during  swallow;Pharyngeal residue -  pyriform Pharyngeal Material enters airway, passes BELOW cords without attempt by patient to eject out (silent aspiration) Pharyngeal- Nectar Straw Delayed swallow initiation-pyriform sinuses;Reduced airway/laryngeal closure;Penetration/Aspiration during swallow;Pharyngeal residue - pyriform Pharyngeal Material enters airway, passes BELOW cords without attempt by patient to eject out (silent aspiration) Pharyngeal- Thin Cup Delayed swallow initiation-pyriform sinuses;Reduced airway/laryngeal closure;Penetration/Aspiration during swallow Pharyngeal Material enters airway, CONTACTS cords and not ejected out Pharyngeal- Thin Straw Delayed swallow initiation-pyriform sinuses;Reduced airway/laryngeal closure;Penetration/Aspiration during swallow;Pharyngeal residue - pyriform Pharyngeal Material enters airway, passes BELOW cords without attempt by patient to eject out (silent aspiration) Pharyngeal- Puree Pharyngeal residue - pyriform;Penetration/Apiration after swallow Pharyngeal Material enters airway, passes BELOW cords without attempt by patient to eject out (silent aspiration)    04/29/2022   4:00 PM Cervical Esophageal Phase  Cervical Esophageal Phase Impaired Honey Cup Reduced cricopharyngeal relaxation;Esophageal backflow into cervical esophagus Nectar Cup Reduced cricopharyngeal relaxation Nectar Straw Reduced cricopharyngeal relaxation Thin Cup Reduced cricopharyngeal relaxation Thin Straw Reduced cricopharyngeal relaxation Puree Reduced cricopharyngeal relaxation;Esophageal backflow into cervical esophagus Osie Bond., M.A. CCC-SLP Acute Rehabilitation Services Office [phone] Secure chat preferred 04/29/2022, 5:50 PM                     VAS [place] LOWER EXTREMITY VENOUS (DVT)  Result Date: 04/28/2022  Lower Venous DVT Study Patient Name:  MARICE ANGELINO  Date of Exam:   04/27/2022 Medical Rec #: [id number]      Accession #:    [id number] Date of Birth: [birth year]     Patient Gender: M  Patient Age:   54 years Exam Location:  [facility] Procedure:      VAS [place] LOWER EXTREMITY VENOUS (DVT) Referring Phys: Vance Gather --------------------------------------------------------------------------------  Indications: Elevated D-dimer (2.96).  Limitations: Poor ultrasound/tissue interface and calcific shadowing, continous patient movement, poor positioning. Comparison Study: No previous exams Performing Technologist: Jody Hill RVT, RDMS  Examination Guidelines: A complete evaluation includes B-mode imaging, spectral Doppler, color Doppler, and power Doppler as needed of all accessible portions of each vessel. Bilateral testing is considered an integral part of a complete examination. Limited examinations for reoccurring indications may be performed as noted. The reflux portion of the exam is performed with the patient in reverse Trendelenburg.  +---------+---------------+---------+-----------+----------+-------------------+ RIGHT    CompressibilityPhasicitySpontaneityPropertiesThrombus Aging      +---------+---------------+---------+-----------+----------+-------------------+ CFV      Full           Yes      Yes                                      +---------+---------------+---------+-----------+----------+-------------------+ SFJ      Full                                                             +---------+---------------+---------+-----------+----------+-------------------+ FV Prox  Full           Yes      Yes                                      +---------+---------------+---------+-----------+----------+-------------------+ FV Mid   Full  Yes      Yes                                      +---------+---------------+---------+-----------+----------+-------------------+ FV DistalFull           Yes      Yes                                      +---------+---------------+---------+-----------+----------+-------------------+ PFV                                                    Not visualized      +---------+---------------+---------+-----------+----------+-------------------+ POP      Full           Yes      Yes                                      +---------+---------------+---------+-----------+----------+-------------------+ PTV                                                   Not well visualized +---------+---------------+---------+-----------+----------+-------------------+ PERO                                                  Not well visualized +---------+---------------+---------+-----------+----------+-------------------+   +---------+---------------+---------+-----------+----------+--------------+ LEFT     CompressibilityPhasicitySpontaneityPropertiesThrombus Aging +---------+---------------+---------+-----------+----------+--------------+ CFV      Full           Yes      Yes                                 +---------+---------------+---------+-----------+----------+--------------+ SFJ      Full                                                        +---------+---------------+---------+-----------+----------+--------------+ FV Prox  Full           Yes      Yes                                 +---------+---------------+---------+-----------+----------+--------------+ FV Mid   Full           Yes      Yes                                 +---------+---------------+---------+-----------+----------+--------------+ FV DistalFull           Yes      Yes                                 +---------+---------------+---------+-----------+----------+--------------+  PFV      Full                                                        +---------+---------------+---------+-----------+----------+--------------+ POP      Full           Yes      Yes                                 +---------+---------------+---------+-----------+----------+--------------+ PTV      Full                                                         +---------+---------------+---------+-----------+----------+--------------+ PERO     Full                                                        +---------+---------------+---------+-----------+----------+--------------+     Summary: BILATERAL: - No evidence of superficial venous thrombosis in the lower extremities, bilaterally. -No evidence of popliteal cyst, bilaterally. RIGHT: - There is no evidence of deep vein thrombosis in the lower extremity. However, portions of this examination were limited- see technologist comments above.  LEFT: - There is no evidence of deep vein thrombosis in the lower extremity.  *See table(s) above for measurements and observations. Electronically signed by Monica Martinez MD on 04/28/2022 at 12:38:09 PM.    Final       Subjective: Patient was seen and examined at bedside.  Overnight events noted.   Patient reports feeling much improved,  Patient is being discharged to SNF for rehab.  Discharge Exam: Vitals:   05/09/22 1200 05/09/22 1211  BP: (!) 107/58   Pulse: 97   Resp: 18   Temp: 97.8 F (36.6 C)   SpO2: 97% 97%   Vitals:   05/09/22 0812 05/09/22 0833 05/09/22 1200 05/09/22 1211  BP:  111/62 (!) 107/58   Pulse:  99 97   Resp:  20 18   Temp:  97.7 F (36.5 C) 97.8 F (36.6 C)   TempSrc:   Oral   SpO2: 95% 93% 97% 97%  Weight:      Height:        General: Pt is alert, awake, not in acute distress Cardiovascular: RRR, S1/S2 +, no rubs, no gallops Respiratory: CTA bilaterally, no wheezing, no rhonchi Abdominal: Soft, NT, ND, bowel sounds +, peg Tube noted. Extremities: no edema, no cyanosis    The results of significant diagnostics from this hospitalization (including imaging, microbiology, ancillary and laboratory) are listed below for reference.     Microbiology: No results found for this or any previous visit (from the past 240 hour(s)).   Labs: BNP (last 3 results) Recent Labs     04/25/22 1104  BNP 622.6*   Basic Metabolic Panel: Recent Labs  Lab 05/03/22 0236 05/03/22 1659 05/04/22 0529 05/04/22 1746 05/05/22 0615 05/05/22 1722 05/06/22 0645 05/08/22 0619 05/09/22 0314  NA 145  --  145  --  145  --  146* 148*  --   K 3.4*  --  3.4*  --  3.6  --  4.2 4.7  --   CL 111  --  112*  --  112*  --  115* 113*  --   CO2 25  --  26  --  28  --  27 28  --   GLUCOSE 139*  --  154*  --  153*  --  146* 95  --   BUN 26*  --  22  --  20  --  19 17  --   CREATININE 0.92  --  0.77  --  0.76  --  0.72 0.81  --   CALCIUM 8.3*  --  8.1*  --  8.0*  --  8.2* 9.1  --   MG  --    < > 2.0 2.1 2.0 2.1 2.0  --  1.9  PHOS  --    < > 2.2* 2.4* 2.9 2.7 2.8  --  3.9   < > = values in this interval not displayed.   Liver Function Tests: No results for input(s): AST, ALT, ALKPHOS, BILITOT, PROT, ALBUMIN in the last 168 hours. No results for input(s): LIPASE, AMYLASE in the last 168 hours. No results for input(s): AMMONIA in the last 168 hours. CBC: Recent Labs  Lab 05/04/22 0529 05/05/22 0615 05/06/22 0645 05/07/22 0257 05/08/22 0619  WBC 15.4* 13.5* 12.6* 11.3* 10.4  HGB 11.7* 11.9* 11.2* 11.0* 11.7*  HCT 38.2* 37.8* 36.0* 36.0* 37.7*  MCV 93.2 94.0 95.0 95.0 93.3  PLT 284 267 280 213 327   Cardiac Enzymes: No results for input(s): CKTOTAL, CKMB, CKMBINDEX, TROPONINI in the last 168 hours. BNP: Invalid input(s): POCBNP CBG: Recent Labs  Lab 05/08/22 2054 05/09/22 0048 05/09/22 0620 05/09/22 0835 05/09/22 1147  GLUCAP 104* 141* 137* 137* 129*   D-Dimer No results for input(s): DDIMER in the last 72 hours. Hgb A1c No results for input(s): HGBA1C in the last 72 hours. Lipid Profile No results for input(s): CHOL, HDL, LDLCALC, TRIG, CHOLHDL, LDLDIRECT in the last 72 hours. Thyroid function studies No results for input(s): TSH, T4TOTAL, T3FREE, THYROIDAB in the last 72 hours.  Invalid input(s): FREET3 Anemia work up No results for input(s): VITAMINB12,  FOLATE, FERRITIN, TIBC, IRON, RETICCTPCT in the last 72 hours. Urinalysis    Component Value Date/Time   COLORURINE AMBER (A) 04/30/2022 0646   APPEARANCEUR CLEAR 04/30/2022 0646   LABSPEC 1.025 04/30/2022 0646   PHURINE 5.0 04/30/2022 0646   GLUCOSEU NEGATIVE 04/30/2022 0646   HGBUR MODERATE (A) 04/30/2022 0646   BILIRUBINUR NEGATIVE 04/30/2022 0646   KETONESUR NEGATIVE 04/30/2022 0646   PROTEINUR NEGATIVE 04/30/2022 0646   NITRITE NEGATIVE 04/30/2022 0646   LEUKOCYTESUR NEGATIVE 04/30/2022 0646   Sepsis Labs Invalid input(s): PROCALCITONIN,  WBC,  LACTICIDVEN Microbiology No results found for this or any previous visit (from the past 240 hour(s)).   Time coordinating discharge: Over 30 minutes  SIGNED:   Shawna Clamp, MD  [facility] 05/09/2022, 2:14 PM Pager   If 7PM-7AM, please contact night-coverage [email or web address] Password [email or web address]

## 2022-05-21 ENCOUNTER — Emergency Department (HOSPITAL_COMMUNITY): Payer: Medicare Other

## 2022-05-21 ENCOUNTER — Encounter (HOSPITAL_COMMUNITY): Payer: Self-pay | Admitting: Emergency Medicine

## 2022-05-21 ENCOUNTER — Inpatient Hospital Stay (HOSPITAL_COMMUNITY)
Admission: EM | Admit: 2022-05-21 | Discharge: 2022-06-15 | DRG: 028 | Disposition: E | Payer: Medicare Other | Source: Skilled Nursing Facility | Attending: Pulmonary Disease | Admitting: Pulmonary Disease

## 2022-05-21 ENCOUNTER — Other Ambulatory Visit: Payer: Self-pay

## 2022-05-21 DIAGNOSIS — M4622 Osteomyelitis of vertebra, cervical region: Secondary | ICD-10-CM | POA: Diagnosis present

## 2022-05-21 DIAGNOSIS — I130 Hypertensive heart and chronic kidney disease with heart failure and stage 1 through stage 4 chronic kidney disease, or unspecified chronic kidney disease: Secondary | ICD-10-CM | POA: Diagnosis present

## 2022-05-21 DIAGNOSIS — E1122 Type 2 diabetes mellitus with diabetic chronic kidney disease: Secondary | ICD-10-CM | POA: Diagnosis present

## 2022-05-21 DIAGNOSIS — I420 Dilated cardiomyopathy: Secondary | ICD-10-CM | POA: Diagnosis present

## 2022-05-21 DIAGNOSIS — R5381 Other malaise: Secondary | ICD-10-CM | POA: Diagnosis present

## 2022-05-21 DIAGNOSIS — E119 Type 2 diabetes mellitus without complications: Secondary | ICD-10-CM

## 2022-05-21 DIAGNOSIS — I69398 Other sequelae of cerebral infarction: Secondary | ICD-10-CM

## 2022-05-21 DIAGNOSIS — M4642 Discitis, unspecified, cervical region: Secondary | ICD-10-CM

## 2022-05-21 DIAGNOSIS — G9520 Unspecified cord compression: Secondary | ICD-10-CM

## 2022-05-21 DIAGNOSIS — G062 Extradural and subdural abscess, unspecified: Principal | ICD-10-CM

## 2022-05-21 DIAGNOSIS — R4701 Aphasia: Secondary | ICD-10-CM | POA: Diagnosis present

## 2022-05-21 DIAGNOSIS — J3801 Paralysis of vocal cords and larynx, unilateral: Secondary | ICD-10-CM | POA: Diagnosis present

## 2022-05-21 DIAGNOSIS — R339 Retention of urine, unspecified: Secondary | ICD-10-CM | POA: Diagnosis present

## 2022-05-21 DIAGNOSIS — E43 Unspecified severe protein-calorie malnutrition: Secondary | ICD-10-CM | POA: Insufficient documentation

## 2022-05-21 DIAGNOSIS — Z803 Family history of malignant neoplasm of breast: Secondary | ICD-10-CM

## 2022-05-21 DIAGNOSIS — Z6831 Body mass index (BMI) 31.0-31.9, adult: Secondary | ICD-10-CM

## 2022-05-21 DIAGNOSIS — I10 Essential (primary) hypertension: Secondary | ICD-10-CM | POA: Diagnosis present

## 2022-05-21 DIAGNOSIS — Z931 Gastrostomy status: Secondary | ICD-10-CM

## 2022-05-21 DIAGNOSIS — E785 Hyperlipidemia, unspecified: Secondary | ICD-10-CM | POA: Diagnosis present

## 2022-05-21 DIAGNOSIS — D649 Anemia, unspecified: Secondary | ICD-10-CM | POA: Diagnosis present

## 2022-05-21 DIAGNOSIS — Z823 Family history of stroke: Secondary | ICD-10-CM

## 2022-05-21 DIAGNOSIS — I9581 Postprocedural hypotension: Secondary | ICD-10-CM | POA: Diagnosis present

## 2022-05-21 DIAGNOSIS — G061 Intraspinal abscess and granuloma: Principal | ICD-10-CM | POA: Diagnosis present

## 2022-05-21 DIAGNOSIS — L8990 Pressure ulcer of unspecified site, unspecified stage: Secondary | ICD-10-CM | POA: Insufficient documentation

## 2022-05-21 DIAGNOSIS — Z87891 Personal history of nicotine dependence: Secondary | ICD-10-CM

## 2022-05-21 DIAGNOSIS — J449 Chronic obstructive pulmonary disease, unspecified: Secondary | ICD-10-CM | POA: Diagnosis present

## 2022-05-21 DIAGNOSIS — N189 Chronic kidney disease, unspecified: Secondary | ICD-10-CM | POA: Diagnosis present

## 2022-05-21 DIAGNOSIS — Z66 Do not resuscitate: Secondary | ICD-10-CM | POA: Diagnosis present

## 2022-05-21 DIAGNOSIS — F32A Depression, unspecified: Secondary | ICD-10-CM | POA: Diagnosis present

## 2022-05-21 DIAGNOSIS — I2510 Atherosclerotic heart disease of native coronary artery without angina pectoris: Secondary | ICD-10-CM | POA: Diagnosis present

## 2022-05-21 DIAGNOSIS — Z951 Presence of aortocoronary bypass graft: Secondary | ICD-10-CM

## 2022-05-21 DIAGNOSIS — Z8249 Family history of ischemic heart disease and other diseases of the circulatory system: Secondary | ICD-10-CM

## 2022-05-21 DIAGNOSIS — Z833 Family history of diabetes mellitus: Secondary | ICD-10-CM

## 2022-05-21 DIAGNOSIS — Z515 Encounter for palliative care: Secondary | ICD-10-CM

## 2022-05-21 DIAGNOSIS — I5042 Chronic combined systolic (congestive) and diastolic (congestive) heart failure: Secondary | ICD-10-CM | POA: Diagnosis present

## 2022-05-21 DIAGNOSIS — I255 Ischemic cardiomyopathy: Secondary | ICD-10-CM | POA: Diagnosis present

## 2022-05-21 DIAGNOSIS — G8250 Quadriplegia, unspecified: Secondary | ICD-10-CM | POA: Diagnosis present

## 2022-05-21 DIAGNOSIS — Z7982 Long term (current) use of aspirin: Secondary | ICD-10-CM

## 2022-05-21 DIAGNOSIS — J9621 Acute and chronic respiratory failure with hypoxia: Secondary | ICD-10-CM | POA: Diagnosis present

## 2022-05-21 DIAGNOSIS — H3530 Unspecified macular degeneration: Secondary | ICD-10-CM | POA: Diagnosis present

## 2022-05-21 DIAGNOSIS — Z79899 Other long term (current) drug therapy: Secondary | ICD-10-CM

## 2022-05-21 DIAGNOSIS — J690 Pneumonitis due to inhalation of food and vomit: Secondary | ICD-10-CM | POA: Diagnosis not present

## 2022-05-21 LAB — CBC WITH DIFFERENTIAL/PLATELET
Abs Immature Granulocytes: 0.07 10*3/uL (ref 0.00–0.07)
Basophils Absolute: 0 10*3/uL (ref 0.0–0.1)
Basophils Relative: 0 %
Eosinophils Absolute: 0.1 10*3/uL (ref 0.0–0.5)
Eosinophils Relative: 1 %
HCT: 34.1 % — ABNORMAL LOW (ref 39.0–52.0)
Hemoglobin: 10.8 g/dL — ABNORMAL LOW (ref 13.0–17.0)
Immature Granulocytes: 1 %
Lymphocytes Relative: 16 %
Lymphs Abs: 1.4 10*3/uL (ref 0.7–4.0)
MCH: 28.8 pg (ref 26.0–34.0)
MCHC: 31.7 g/dL (ref 30.0–36.0)
MCV: 90.9 fL (ref 80.0–100.0)
Monocytes Absolute: 0.8 10*3/uL (ref 0.1–1.0)
Monocytes Relative: 9 %
Neutro Abs: 6.3 10*3/uL (ref 1.7–7.7)
Neutrophils Relative %: 73 %
Platelets: 204 10*3/uL (ref 150–400)
RBC: 3.75 MIL/uL — ABNORMAL LOW (ref 4.22–5.81)
RDW: 13.2 % (ref 11.5–15.5)
WBC: 8.7 10*3/uL (ref 4.0–10.5)
nRBC: 0 % (ref 0.0–0.2)

## 2022-05-21 LAB — I-STAT VENOUS BLOOD GAS, ED
Acid-Base Excess: 5 mmol/L — ABNORMAL HIGH (ref 0.0–2.0)
Bicarbonate: 27.1 mmol/L (ref 20.0–28.0)
Calcium, Ion: 1.07 mmol/L — ABNORMAL LOW (ref 1.15–1.40)
HCT: 32 % — ABNORMAL LOW (ref 39.0–52.0)
Hemoglobin: 10.9 g/dL — ABNORMAL LOW (ref 13.0–17.0)
O2 Saturation: 99 %
Potassium: 3.8 mmol/L (ref 3.5–5.1)
Sodium: 134 mmol/L — ABNORMAL LOW (ref 135–145)
TCO2: 28 mmol/L (ref 22–32)
pCO2, Ven: 32.5 mmHg — ABNORMAL LOW (ref 44–60)
pH, Ven: 7.53 — ABNORMAL HIGH (ref 7.25–7.43)
pO2, Ven: 103 mmHg — ABNORMAL HIGH (ref 32–45)

## 2022-05-21 NOTE — ED Provider Notes (Signed)
[facility] EMERGENCY DEPARTMENT Provider Note   CSN: [id number] Arrival date & time: 06/03/2022  2233     History {Add pertinent medical, surgical, social history, OB history to HPI:1} Chief Complaint  Patient presents with   Shortness of Breath    [name] is a 81 y.o. male with a history of CHF, CAD S/p CABG, CKD, diabetes mellitus, COPD, hypertension, and recent complex medical admission who presents to the emergency department from rehab facility due to shortness of breath.  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  Per his wife since leaving the hospital he has been on a baseline 3 L via nasal cannula of oxygen, over the past 2 days he has had steady decline in his overall health.  He was previously ambulating with assistance, however over the past 48 hours he has become too weak to get up or move his limbs.  He started to have significant coughing as well as shortness of breath.  Today his SPO2 dropped into the 80s on his baseline 3 L via nasal cannula.  They had to increase his oxygen.  They gave him extra Lasix due to fluid on the chest x-ray.  Given his progressive worsening in respiratory status EMS was called.  Patient has pain in his shoulder blades bilaterally which is similar to prior, no acute change in the past few days.        HPI     Home Medications Prior to Admission medications   Medication Sig Start Date End Date Taking? Authorizing Provider  acetaminophen (TYLENOL) 160 MG/5ML solution Place 20.3 mLs (650 mg total) into feeding tube every 4 (four) hours as needed for headache or mild pain. 05/09/22   Shawna Clamp, MD  albuterol (PROVENTIL HFA;VENTOLIN HFA) 108 (90 Base) MCG/ACT inhaler Inhale 1-2 puffs into the lungs every 6 (six) hours as needed for wheezing or shortness of breath. Patient taking differently: Inhale 2 puffs into the lungs every 4 (four) hours as  needed for wheezing or shortness of breath. 03/05/17   Reyne Dumas, MD  amLODipine (NORVASC) 10 MG tablet Place 1 tablet (10 mg total) into feeding tube daily. 05/09/22   Shawna Clamp, MD  atorvastatin (LIPITOR) 40 MG tablet Place 1 tablet (40 mg total) into feeding tube daily. 05/09/22   Shawna Clamp, MD  carbamide peroxide (DEBROX) 6.5 % OTIC solution Place 5 drops into both ears daily as needed (wax buildup).    [provider]  dorzolamide (TRUSOPT) 2 % ophthalmic solution Place 1 drop into both eyes 2 (two) times daily.  01/24/20   [provider]  furosemide (LASIX) 40 MG tablet Place 1-2 tablets (40-80 mg total) into feeding tube daily as needed for edema or fluid. 05/09/22   Shawna Clamp, MD  metoprolol succinate (TOPROL-XL) 25 MG 24 hr tablet Take 1 tablet (25 mg total) by mouth daily. 05/09/22   Shawna Clamp, MD  Multiple Vitamin (MULTIVITAMIN) capsule Take 1 capsule by mouth daily.    [provider]  Nutritional Supplements (FEEDING SUPPLEMENT, GLUCERNA 1.5 CAL,) LIQD Place 1,000 mLs into feeding tube continuous. 05/09/22   Shawna Clamp, MD  silodosin (RAPAFLO) 8 MG CAPS capsule Place 1 capsule (8 mg total) into feeding tube daily with supper. 05/09/22   Shawna Clamp, MD      Allergies    Patient has no known allergies.    Review of Systems   Review of Systems  Physical Exam Updated Vital Signs BP 125/62 (BP Location: Left Arm)   Pulse (!) 114   Temp 98.1 F (36.7 C) (Temporal)   Resp 20   SpO2 93%  Physical Exam  ED Results / Procedures / Treatments   Labs (all labs ordered are listed, but only abnormal results are displayed) Labs Reviewed  COMPREHENSIVE METABOLIC PANEL  BRAIN NATRIURETIC PEPTIDE  CBC WITH DIFFERENTIAL/PLATELET  BLOOD GAS, VENOUS  MAGNESIUM  TROPONIN I (HIGH SENSITIVITY)    EKG None  Radiology DG Chest Portable 1 View  Result Date: 05/27/2022 CLINICAL DATA:  Dyspnea. EXAM: PORTABLE CHEST 1 VIEW COMPARISON:   Radiograph 04/29/2022, CT 04/27/2022 FINDINGS: Prior median sternotomy and CABG. Lung volumes are low. Heart size difficult to assess but upper normal in size. Aortic atherosclerosis, stable mediastinal contours. Increased interstitial markings are again seen, slight improvement on the left from prior. There may be a small right pleural effusion. No confluent consolidation or pneumothorax. IMPRESSION: 1. Low lung volumes. Increased interstitial opacities with slight improvement on the left from prior exam, may be chronic or represent pulmonary edema. 2. Possible small right pleural effusion. Electronically Signed   By: Keith Rake M.D.   On: 05/20/2022 23:04    Procedures Procedures  {Document cardiac monitor, telemetry assessment procedure when appropriate:1}  Medications Ordered in ED Medications - No data to display  ED Course/ Medical Decision Making/ A&P                           Medical Decision Making Amount and/or Complexity of Data Reviewed Labs: ordered.   Patient presents to the ED with complaints of ***, this involves an extensive number of treatment options, and is a complaint that carries with it a high risk of complications and morbidity. Nontoxic, vitals ***.   Ddx including but not limited to: ***  Additional history obtained:  Chart & nursing note reviewed.  External records reviewed:  Recent hospital admission 05/11 through 05/25 of this year, presented to the ED with chest pain and dyspnea, found to have acute on chronic respiratory failure suspected to be secondary to acute CHF and pneumonia with subsequent CTA showing no PE.  He ultimately had an MRI that showed findings of subarachnoid hemorrhage versus meningitis, lumbar puncture was performed and found to be consistent with bacterial meningitis.  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  Completed antibiotics 05/12/2022.  PEG tube utilized for feeding  and medications.  Urinary retention requiring Foley catheter.  EKG: ***  Lab Tests:  I viewed & interpreted labs including:  ***  Imaging Studies:  I ordered and viewed the following imaging, agree with radiologist impression:  ***  ED Course:  I ordered medications including *** for ***  ***: RE-EVAL: ***   Based on patient's chief complaint, I considered admission might be necessary, however after reassuring ED workup feel patient is reasonable for discharge.   Critical Interventions: ***  Amount and/or Complexity of Data Reviewed Independent Historian: *** External Data Reviewed: *** Labs: *** Ordered, viewed, and interpreted- Decision-making details documented in ED Course. Radiology: ***Ordered, viewed, and interpretation performed. Decision-making details documented in ED Course. ECG/medicine tests: ***ordered and independent interpretation performed. Discussion of management or test interpretation with external provider(s): *** Social determinants: ***   Portions of this note were generated with [employer]. Dictation errors may occur despite best attempts at proofreading.   {Document  critical care time when appropriate:1} {Document review of labs and clinical decision tools ie heart score, Chads2Vasc2 etc:1}  {Document your independent review of radiology images, and any outside records:1} {Document your discussion with family members, caretakers, and with consultants:1} {Document social determinants of health affecting pt's care:1} {Document your decision making why or why not admission, treatments were needed:1} Final Clinical Impression(s) / ED Diagnoses Final diagnoses:  None    Rx / DC Orders ED Discharge Orders     None

## 2022-05-21 NOTE — ED Triage Notes (Signed)
Pt BIB [facility] from [facility]. C/o shortness of breath and generalized body cramping. Per staff, pt hypoxic on 6LNC, placed on NRB with improvement. Pt does have a productive cough, afebrile.

## 2022-05-22 ENCOUNTER — Other Ambulatory Visit: Payer: Self-pay

## 2022-05-22 ENCOUNTER — Inpatient Hospital Stay (HOSPITAL_COMMUNITY): Admission: EM | Disposition: E | Payer: Self-pay | Source: Skilled Nursing Facility | Attending: Pulmonary Disease

## 2022-05-22 ENCOUNTER — Emergency Department (HOSPITAL_COMMUNITY): Payer: Medicare Other

## 2022-05-22 ENCOUNTER — Inpatient Hospital Stay (HOSPITAL_COMMUNITY): Payer: Medicare Other

## 2022-05-22 ENCOUNTER — Encounter (HOSPITAL_COMMUNITY): Payer: Self-pay | Admitting: Internal Medicine

## 2022-05-22 ENCOUNTER — Inpatient Hospital Stay (HOSPITAL_COMMUNITY): Payer: Medicare Other | Admitting: Anesthesiology

## 2022-05-22 DIAGNOSIS — I5042 Chronic combined systolic (congestive) and diastolic (congestive) heart failure: Secondary | ICD-10-CM | POA: Diagnosis present

## 2022-05-22 DIAGNOSIS — J449 Chronic obstructive pulmonary disease, unspecified: Secondary | ICD-10-CM | POA: Diagnosis present

## 2022-05-22 DIAGNOSIS — M4642 Discitis, unspecified, cervical region: Secondary | ICD-10-CM

## 2022-05-22 DIAGNOSIS — R4701 Aphasia: Secondary | ICD-10-CM | POA: Diagnosis present

## 2022-05-22 DIAGNOSIS — I420 Dilated cardiomyopathy: Secondary | ICD-10-CM | POA: Diagnosis present

## 2022-05-22 DIAGNOSIS — I110 Hypertensive heart disease with heart failure: Secondary | ICD-10-CM | POA: Diagnosis not present

## 2022-05-22 DIAGNOSIS — J690 Pneumonitis due to inhalation of food and vomit: Secondary | ICD-10-CM | POA: Diagnosis not present

## 2022-05-22 DIAGNOSIS — E1122 Type 2 diabetes mellitus with diabetic chronic kidney disease: Secondary | ICD-10-CM | POA: Diagnosis present

## 2022-05-22 DIAGNOSIS — J9611 Chronic respiratory failure with hypoxia: Secondary | ICD-10-CM | POA: Insufficient documentation

## 2022-05-22 DIAGNOSIS — I130 Hypertensive heart and chronic kidney disease with heart failure and stage 1 through stage 4 chronic kidney disease, or unspecified chronic kidney disease: Secondary | ICD-10-CM | POA: Diagnosis present

## 2022-05-22 DIAGNOSIS — Z931 Gastrostomy status: Secondary | ICD-10-CM | POA: Insufficient documentation

## 2022-05-22 DIAGNOSIS — N189 Chronic kidney disease, unspecified: Secondary | ICD-10-CM | POA: Diagnosis present

## 2022-05-22 DIAGNOSIS — I2510 Atherosclerotic heart disease of native coronary artery without angina pectoris: Secondary | ICD-10-CM

## 2022-05-22 DIAGNOSIS — J9621 Acute and chronic respiratory failure with hypoxia: Secondary | ICD-10-CM | POA: Diagnosis present

## 2022-05-22 DIAGNOSIS — I69398 Other sequelae of cerebral infarction: Secondary | ICD-10-CM | POA: Diagnosis not present

## 2022-05-22 DIAGNOSIS — E119 Type 2 diabetes mellitus without complications: Secondary | ICD-10-CM

## 2022-05-22 DIAGNOSIS — M4622 Osteomyelitis of vertebra, cervical region: Secondary | ICD-10-CM | POA: Diagnosis present

## 2022-05-22 DIAGNOSIS — Z66 Do not resuscitate: Secondary | ICD-10-CM | POA: Diagnosis present

## 2022-05-22 DIAGNOSIS — G0491 Myelitis, unspecified: Secondary | ICD-10-CM | POA: Diagnosis not present

## 2022-05-22 DIAGNOSIS — E785 Hyperlipidemia, unspecified: Secondary | ICD-10-CM | POA: Diagnosis present

## 2022-05-22 DIAGNOSIS — I509 Heart failure, unspecified: Secondary | ICD-10-CM

## 2022-05-22 DIAGNOSIS — H409 Unspecified glaucoma: Secondary | ICD-10-CM | POA: Insufficient documentation

## 2022-05-22 DIAGNOSIS — G061 Intraspinal abscess and granuloma: Secondary | ICD-10-CM | POA: Diagnosis present

## 2022-05-22 DIAGNOSIS — I10 Essential (primary) hypertension: Secondary | ICD-10-CM

## 2022-05-22 DIAGNOSIS — I255 Ischemic cardiomyopathy: Secondary | ICD-10-CM | POA: Diagnosis present

## 2022-05-22 DIAGNOSIS — D649 Anemia, unspecified: Secondary | ICD-10-CM | POA: Diagnosis present

## 2022-05-22 DIAGNOSIS — F32A Depression, unspecified: Secondary | ICD-10-CM | POA: Diagnosis present

## 2022-05-22 DIAGNOSIS — G8250 Quadriplegia, unspecified: Secondary | ICD-10-CM | POA: Diagnosis present

## 2022-05-22 DIAGNOSIS — E43 Unspecified severe protein-calorie malnutrition: Secondary | ICD-10-CM | POA: Diagnosis present

## 2022-05-22 DIAGNOSIS — R339 Retention of urine, unspecified: Secondary | ICD-10-CM | POA: Diagnosis present

## 2022-05-22 DIAGNOSIS — J3801 Paralysis of vocal cords and larynx, unilateral: Secondary | ICD-10-CM | POA: Diagnosis present

## 2022-05-22 DIAGNOSIS — Z6831 Body mass index (BMI) 31.0-31.9, adult: Secondary | ICD-10-CM | POA: Diagnosis not present

## 2022-05-22 DIAGNOSIS — Z515 Encounter for palliative care: Secondary | ICD-10-CM | POA: Diagnosis not present

## 2022-05-22 HISTORY — PX: ANTERIOR CERVICAL DECOMP/DISCECTOMY FUSION: SHX1161

## 2022-05-22 LAB — ABO/RH: ABO/RH(D): O POS

## 2022-05-22 LAB — COMPREHENSIVE METABOLIC PANEL
ALT: 25 U/L (ref 0–44)
ALT: 23 U/L (ref 0–44)
AST: 18 U/L (ref 15–41)
AST: 17 U/L (ref 15–41)
Albumin: 2.7 g/dL — ABNORMAL LOW (ref 3.5–5.0)
Albumin: 2.4 g/dL — ABNORMAL LOW (ref 3.5–5.0)
Alkaline Phosphatase: 104 U/L (ref 38–126)
Alkaline Phosphatase: 95 U/L (ref 38–126)
Anion gap: 12 (ref 5–15)
Anion gap: 8 (ref 5–15)
BUN: 25 mg/dL — ABNORMAL HIGH (ref 8–23)
BUN: 23 mg/dL (ref 8–23)
CO2: 25 mmol/L (ref 22–32)
CO2: 25 mmol/L (ref 22–32)
Calcium: 8.5 mg/dL — ABNORMAL LOW (ref 8.9–10.3)
Calcium: 8.3 mg/dL — ABNORMAL LOW (ref 8.9–10.3)
Chloride: 99 mmol/L (ref 98–111)
Chloride: 103 mmol/L (ref 98–111)
Creatinine, Ser: 0.7 mg/dL (ref 0.61–1.24)
Creatinine, Ser: 0.69 mg/dL (ref 0.61–1.24)
GFR, Estimated: >60 mL/min (ref >60)
GFR, Estimated: >60 mL/min (ref >60)
Glucose, Bld: 122 mg/dL — ABNORMAL HIGH (ref 70–99)
Glucose, Bld: 132 mg/dL — ABNORMAL HIGH (ref 70–99)
Potassium: 3.9 mmol/L (ref 3.5–5.1)
Potassium: 3.9 mmol/L (ref 3.5–5.1)
Sodium: 136 mmol/L (ref 135–145)
Sodium: 136 mmol/L (ref 135–145)
Total Bilirubin: 0.9 mg/dL (ref 0.3–1.2)
Total Bilirubin: 0.9 mg/dL (ref 0.3–1.2)
Total Protein: 6 g/dL — ABNORMAL LOW (ref 6.5–8.1)
Total Protein: 5.7 g/dL — ABNORMAL LOW (ref 6.5–8.1)

## 2022-05-22 LAB — SURGICAL PCR SCREEN
MRSA, PCR: NEGATIVE
Staphylococcus aureus: NEGATIVE

## 2022-05-22 LAB — LACTIC ACID, PLASMA: Lactic Acid, Venous: 0.8 mmol/L (ref 0.5–1.9)

## 2022-05-22 LAB — CBC
HCT: 28.9 % — ABNORMAL LOW (ref 39.0–52.0)
Hemoglobin: 9.2 g/dL — ABNORMAL LOW (ref 13.0–17.0)
MCH: 29.2 pg (ref 26.0–34.0)
MCHC: 31.8 g/dL (ref 30.0–36.0)
MCV: 91.7 fL (ref 80.0–100.0)
Platelets: 195 10*3/uL (ref 150–400)
RBC: 3.15 MIL/uL — ABNORMAL LOW (ref 4.22–5.81)
RDW: 13.1 % (ref 11.5–15.5)
WBC: 8.7 10*3/uL (ref 4.0–10.5)
nRBC: 0 % (ref 0.0–0.2)

## 2022-05-22 LAB — CBC WITH DIFFERENTIAL/PLATELET
Abs Immature Granulocytes: 0.09 10*3/uL — ABNORMAL HIGH (ref 0.00–0.07)
Basophils Absolute: 0 10*3/uL (ref 0.0–0.1)
Basophils Relative: 0 %
Eosinophils Absolute: 0 10*3/uL (ref 0.0–0.5)
Eosinophils Relative: 0 %
HCT: 30.3 % — ABNORMAL LOW (ref 39.0–52.0)
Hemoglobin: 9.6 g/dL — ABNORMAL LOW (ref 13.0–17.0)
Immature Granulocytes: 1 %
Lymphocytes Relative: 10 %
Lymphs Abs: 0.8 10*3/uL (ref 0.7–4.0)
MCH: 29 pg (ref 26.0–34.0)
MCHC: 31.7 g/dL (ref 30.0–36.0)
MCV: 91.5 fL (ref 80.0–100.0)
Monocytes Absolute: 0.8 10*3/uL (ref 0.1–1.0)
Monocytes Relative: 10 %
Neutro Abs: 6.2 10*3/uL (ref 1.7–7.7)
Neutrophils Relative %: 79 %
Platelets: 196 10*3/uL (ref 150–400)
RBC: 3.31 MIL/uL — ABNORMAL LOW (ref 4.22–5.81)
RDW: 13.2 % (ref 11.5–15.5)
WBC: 8 10*3/uL (ref 4.0–10.5)
nRBC: 0 % (ref 0.0–0.2)

## 2022-05-22 LAB — BRAIN NATRIURETIC PEPTIDE: B Natriuretic Peptide: 144.4 pg/mL — ABNORMAL HIGH (ref 0.0–100.0)

## 2022-05-22 LAB — D-DIMER, QUANTITATIVE: D-Dimer, Quant: 2.4 ug/mL-FEU — ABNORMAL HIGH (ref 0.00–0.50)

## 2022-05-22 LAB — GLUCOSE, CAPILLARY
Glucose-Capillary: 110 mg/dL — ABNORMAL HIGH (ref 70–99)
Glucose-Capillary: 126 mg/dL — ABNORMAL HIGH (ref 70–99)
Glucose-Capillary: 137 mg/dL — ABNORMAL HIGH (ref 70–99)

## 2022-05-22 LAB — TYPE AND SCREEN
ABO/RH(D): O POS
Antibody Screen: NEGATIVE

## 2022-05-22 LAB — TROPONIN I (HIGH SENSITIVITY)
Troponin I (High Sensitivity): 9 ng/L (ref <18)
Troponin I (High Sensitivity): 8 ng/L (ref <18)

## 2022-05-22 LAB — MAGNESIUM
Magnesium: 1.7 mg/dL (ref 1.7–2.4)
Magnesium: 1.8 mg/dL (ref 1.7–2.4)

## 2022-05-22 LAB — CBG MONITORING, ED: Glucose-Capillary: 131 mg/dL — ABNORMAL HIGH (ref 70–99)

## 2022-05-22 SURGERY — ANTERIOR CERVICAL DECOMPRESSION/DISCECTOMY FUSION 2 LEVELS
Anesthesia: General

## 2022-05-22 MED ORDER — VANCOMYCIN HCL IN DEXTROSE 1-5 GM/200ML-% IV SOLN: 1000.0000 mg | INTRAVENOUS | Status: DC

## 2022-05-22 MED ORDER — METHOCARBAMOL 1000 MG/10ML IJ SOLN
500.0000 mg | Freq: Three times a day (TID) | INTRAMUSCULAR | Status: DC | PRN
  Administered 2022-05-23 – 2022-05-24 (×2): 500 mg via INTRAVENOUS
  Filled 2022-05-22 (×3): qty 5

## 2022-05-22 MED ORDER — PHENOL 1.4 % MT LIQD: 1.0000 | OROMUCOSAL | Status: DC | PRN

## 2022-05-22 MED ORDER — DEXAMETHASONE SODIUM PHOSPHATE 10 MG/ML IJ SOLN
INTRAMUSCULAR | Status: DC | PRN
  Administered 2022-05-22: 10 mg via INTRAVENOUS

## 2022-05-22 MED ORDER — ALBUMIN HUMAN 5 % IV SOLN: INTRAVENOUS | Status: DC | PRN

## 2022-05-22 MED ORDER — CHLORHEXIDINE GLUCONATE 0.12 % MT SOLN
15.0000 mL | Freq: Once | OROMUCOSAL | Status: AC
  Administered 2022-05-22: 15 mL via OROMUCOSAL
  Filled 2022-05-22: qty 15

## 2022-05-22 MED ORDER — INSULIN ASPART 100 UNIT/ML IJ SOLN: 0.0000 [IU] | INTRAMUSCULAR | Status: DC | PRN

## 2022-05-22 MED ORDER — INSULIN ASPART 100 UNIT/ML IJ SOLN
0.0000 [IU] | Freq: Four times a day (QID) | INTRAMUSCULAR | Status: DC
  Administered 2022-05-22: 2 [IU] via SUBCUTANEOUS

## 2022-05-22 MED ORDER — INSULIN ASPART 100 UNIT/ML IJ SOLN
0.0000 [IU] | INTRAMUSCULAR | Status: DC
  Administered 2022-05-23 – 2022-05-24 (×4): 2 [IU] via SUBCUTANEOUS

## 2022-05-22 MED ORDER — PANTOPRAZOLE SODIUM 40 MG IV SOLR
40.0000 mg | Freq: Every day | INTRAVENOUS | Status: DC
  Administered 2022-05-22: 40 mg via INTRAVENOUS
  Filled 2022-05-22: qty 10

## 2022-05-22 MED ORDER — ACETAMINOPHEN 500 MG PO TABS: 1000.0000 mg | ORAL_TABLET | Freq: Once | ORAL | Status: DC

## 2022-05-22 MED ORDER — VANCOMYCIN HCL 750 MG/150ML IV SOLN
750.0000 mg | Freq: Two times a day (BID) | INTRAVENOUS | Status: DC
  Administered 2022-05-22 – 2022-05-24 (×4): 750 mg via INTRAVENOUS
  Filled 2022-05-22 (×6): qty 150

## 2022-05-22 MED ORDER — HYDRALAZINE HCL 20 MG/ML IJ SOLN: 10.0000 mg | Freq: Four times a day (QID) | INTRAMUSCULAR | Status: DC | PRN

## 2022-05-22 MED ORDER — POLYETHYLENE GLYCOL 3350 17 G PO PACK
17.0000 g | PACK | Freq: Every day | ORAL | Status: DC
  Administered 2022-05-22 – 2022-05-24 (×3): 17 g
  Filled 2022-05-22 (×3): qty 1

## 2022-05-22 MED ORDER — LACTATED RINGERS IV SOLN: INTRAVENOUS | Status: DC | PRN

## 2022-05-22 MED ORDER — VANCOMYCIN HCL 1500 MG/300ML IV SOLN
1500.0000 mg | Freq: Once | INTRAVENOUS | Status: AC
  Administered 2022-05-22: 1500 mg via INTRAVENOUS
  Filled 2022-05-22: qty 300

## 2022-05-22 MED ORDER — ONDANSETRON HCL 4 MG/2ML IJ SOLN: 4.0000 mg | Freq: Four times a day (QID) | INTRAMUSCULAR | Status: DC | PRN

## 2022-05-22 MED ORDER — DEXAMETHASONE SODIUM PHOSPHATE 10 MG/ML IJ SOLN
INTRAMUSCULAR | Status: AC
  Filled 2022-05-22: qty 1

## 2022-05-22 MED ORDER — METOPROLOL TARTRATE 25 MG/10 ML ORAL SUSPENSION
12.5000 mg | Freq: Two times a day (BID) | ORAL | Status: DC
  Administered 2022-05-22 – 2022-05-24 (×4): 12.5 mg
  Filled 2022-05-22: qty 5
  Filled 2022-05-22 (×2): qty 10
  Filled 2022-05-22: qty 5
  Filled 2022-05-22 (×2): qty 10

## 2022-05-22 MED ORDER — LIDOCAINE 2% (20 MG/ML) 5 ML SYRINGE
INTRAMUSCULAR | Status: AC
  Filled 2022-05-22: qty 5

## 2022-05-22 MED ORDER — ENOXAPARIN SODIUM 40 MG/0.4ML IJ SOSY: 40.0000 mg | PREFILLED_SYRINGE | INTRAMUSCULAR | Status: DC

## 2022-05-22 MED ORDER — GADOBUTROL 1 MMOL/ML IV SOLN
8.0000 mL | Freq: Once | INTRAVENOUS | Status: AC | PRN
Start: 2022-05-22 — End: 2022-05-22
  Administered 2022-05-22: 8 mL via INTRAVENOUS

## 2022-05-22 MED ORDER — DOCUSATE SODIUM 50 MG/5ML PO LIQD
100.0000 mg | Freq: Two times a day (BID) | ORAL | Status: DC
  Administered 2022-05-22 – 2022-05-24 (×4): 100 mg
  Filled 2022-05-22 (×4): qty 10

## 2022-05-22 MED ORDER — ACETAMINOPHEN 325 MG PO TABS: 650.0000 mg | ORAL_TABLET | Freq: Four times a day (QID) | ORAL | Status: DC | PRN

## 2022-05-22 MED ORDER — BUPIVACAINE HCL (PF) 0.5 % IJ SOLN
INTRAMUSCULAR | Status: AC
Start: 2022-05-22 — End: ?
  Filled 2022-05-22: qty 30

## 2022-05-22 MED ORDER — PANTOPRAZOLE 2 MG/ML SUSPENSION: 40.0000 mg | Freq: Every day | ORAL | Status: DC

## 2022-05-22 MED ORDER — SUCCINYLCHOLINE 20MG/ML (10ML) SYRINGE FOR MEDFUSION PUMP - OPTIME
INTRAMUSCULAR | Status: DC | PRN
  Administered 2022-05-22: 120 mg via INTRAVENOUS

## 2022-05-22 MED ORDER — SODIUM CHLORIDE 0.9 % IV SOLN
2.0000 g | INTRAVENOUS | Status: DC
  Administered 2022-05-22: 2 g via INTRAVENOUS
  Filled 2022-05-22: qty 20

## 2022-05-22 MED ORDER — THROMBIN 5000 UNITS EX SOLR
CUTANEOUS | Status: AC
Start: 2022-05-22 — End: ?
  Filled 2022-05-22: qty 5000

## 2022-05-22 MED ORDER — MAGNESIUM SULFATE 2 GM/50ML IV SOLN
2.0000 g | Freq: Once | INTRAVENOUS | Status: AC
  Administered 2022-05-22: 2 g via INTRAVENOUS
  Filled 2022-05-22: qty 50

## 2022-05-22 MED ORDER — ATORVASTATIN CALCIUM 40 MG PO TABS
40.0000 mg | ORAL_TABLET | Freq: Every day | ORAL | Status: DC
Start: 2022-05-22 — End: 2022-05-24
  Administered 2022-05-23 – 2022-05-24 (×2): 40 mg
  Filled 2022-05-22 (×3): qty 1

## 2022-05-22 MED ORDER — PHENYLEPHRINE 80 MCG/ML (10ML) SYRINGE FOR IV PUSH (FOR BLOOD PRESSURE SUPPORT)
PREFILLED_SYRINGE | INTRAVENOUS | Status: DC | PRN
  Administered 2022-05-22 (×2): 160 ug via INTRAVENOUS

## 2022-05-22 MED ORDER — THROMBIN (RECOMBINANT) 5000 UNITS EX SOLR: CUTANEOUS | Status: DC | PRN

## 2022-05-22 MED ORDER — LIDOCAINE 2% (20 MG/ML) 5 ML SYRINGE
INTRAMUSCULAR | Status: DC | PRN
  Administered 2022-05-22: 100 mg via INTRAVENOUS

## 2022-05-22 MED ORDER — SODIUM CHLORIDE 0.9 % IV BOLUS
250.0000 mL | Freq: Once | INTRAVENOUS | Status: AC
Start: 2022-05-22 — End: 2022-05-22
  Administered 2022-05-22: 250 mL via INTRAVENOUS

## 2022-05-22 MED ORDER — FENTANYL CITRATE PF 50 MCG/ML IJ SOSY
25.0000 ug | PREFILLED_SYRINGE | INTRAMUSCULAR | Status: DC | PRN
  Administered 2022-05-22: 100 ug via INTRAVENOUS
  Filled 2022-05-22: qty 2

## 2022-05-22 MED ORDER — 0.9 % SODIUM CHLORIDE (POUR BTL) OPTIME
TOPICAL | Status: DC | PRN
  Administered 2022-05-22: 1000 mL

## 2022-05-22 MED ORDER — SODIUM CHLORIDE 0.9% FLUSH: 3.0000 mL | INTRAVENOUS | Status: DC | PRN

## 2022-05-22 MED ORDER — OXYCODONE HCL 5 MG PO TABS
5.0000 mg | ORAL_TABLET | ORAL | Status: DC | PRN
  Administered 2022-05-23: 5 mg via ORAL
  Filled 2022-05-22: qty 1

## 2022-05-22 MED ORDER — ALBUTEROL SULFATE (2.5 MG/3ML) 0.083% IN NEBU: 3.0000 mL | INHALATION_SOLUTION | RESPIRATORY_TRACT | Status: DC | PRN

## 2022-05-22 MED ORDER — SODIUM CHLORIDE 0.9 % IV SOLN
2.0000 g | Freq: Two times a day (BID) | INTRAVENOUS | Status: DC
  Administered 2022-05-22 – 2022-05-24 (×4): 2 g via INTRAVENOUS
  Filled 2022-05-22 (×4): qty 20

## 2022-05-22 MED ORDER — PANTOPRAZOLE SODIUM 40 MG IV SOLR: 40.0000 mg | Freq: Every day | INTRAVENOUS | Status: DC

## 2022-05-22 MED ORDER — ORAL CARE MOUTH RINSE: 15.0000 mL | Freq: Once | OROMUCOSAL | Status: AC

## 2022-05-22 MED ORDER — INSULIN ASPART 100 UNIT/ML IJ SOLN
0.0000 [IU] | INTRAMUSCULAR | Status: DC
  Administered 2022-05-22: 2 [IU] via SUBCUTANEOUS

## 2022-05-22 MED ORDER — DORZOLAMIDE HCL 2 % OP SOLN
1.0000 [drp] | Freq: Two times a day (BID) | OPHTHALMIC | Status: DC
Start: 2022-05-22 — End: 2022-05-24
  Administered 2022-05-23 – 2022-05-24 (×3): 1 [drp] via OPHTHALMIC
  Filled 2022-05-22 (×2): qty 10

## 2022-05-22 MED ORDER — FENTANYL CITRATE (PF) 250 MCG/5ML IJ SOLN
INTRAMUSCULAR | Status: AC
  Filled 2022-05-22: qty 5

## 2022-05-22 MED ORDER — ROCURONIUM BROMIDE 10 MG/ML (PF) SYRINGE
PREFILLED_SYRINGE | INTRAVENOUS | Status: DC | PRN
  Administered 2022-05-22: 80 mg via INTRAVENOUS
  Administered 2022-05-22: 50 mg via INTRAVENOUS

## 2022-05-22 MED ORDER — IPRATROPIUM-ALBUTEROL 0.5-2.5 (3) MG/3ML IN SOLN: 3.0000 mL | RESPIRATORY_TRACT | Status: DC | PRN

## 2022-05-22 MED ORDER — AMLODIPINE BESYLATE 5 MG PO TABS: 10.0000 mg | ORAL_TABLET | Freq: Every day | ORAL | Status: DC

## 2022-05-22 MED ORDER — THROMBIN 5000 UNITS EX SOLR: OROMUCOSAL | Status: DC | PRN

## 2022-05-22 MED ORDER — OXYCODONE HCL 5 MG PO TABS: 10.0000 mg | ORAL_TABLET | ORAL | Status: DC | PRN

## 2022-05-22 MED ORDER — ACETAMINOPHEN 650 MG RE SUPP
650.0000 mg | Freq: Four times a day (QID) | RECTAL | Status: DC | PRN
Start: 2022-05-22 — End: 2022-05-24

## 2022-05-22 MED ORDER — PROPOFOL 10 MG/ML IV BOLUS
INTRAVENOUS | Status: DC | PRN
  Administered 2022-05-22: 70 mg via INTRAVENOUS

## 2022-05-22 MED ORDER — LIDOCAINE-EPINEPHRINE 1 %-1:100000 IJ SOLN
INTRAMUSCULAR | Status: DC | PRN
  Administered 2022-05-22: 4 mL

## 2022-05-22 MED ORDER — SODIUM CHLORIDE 0.9 % IV BOLUS: 250.0000 mL | Freq: Once | INTRAVENOUS | Status: DC

## 2022-05-22 MED ORDER — FENTANYL CITRATE (PF) 250 MCG/5ML IJ SOLN
INTRAMUSCULAR | Status: DC | PRN
  Administered 2022-05-22: 50 ug via INTRAVENOUS
  Administered 2022-05-22: 100 ug via INTRAVENOUS
  Administered 2022-05-22: 200 ug via INTRAVENOUS

## 2022-05-22 MED ORDER — ENOXAPARIN SODIUM 40 MG/0.4ML IJ SOSY
40.0000 mg | PREFILLED_SYRINGE | INTRAMUSCULAR | Status: DC
  Administered 2022-05-23 – 2022-05-24 (×2): 40 mg via SUBCUTANEOUS
  Filled 2022-05-22 (×2): qty 0.4

## 2022-05-22 MED ORDER — SODIUM CHLORIDE 0.9 % IV BOLUS
250.0000 mL | Freq: Once | INTRAVENOUS | Status: AC
  Administered 2022-05-22: 250 mL via INTRAVENOUS

## 2022-05-22 MED ORDER — CHLORHEXIDINE GLUCONATE CLOTH 2 % EX PADS
6.0000 | MEDICATED_PAD | Freq: Every day | CUTANEOUS | Status: DC
  Administered 2022-05-22 – 2022-05-23 (×2): 6 via TOPICAL

## 2022-05-22 MED ORDER — ONDANSETRON HCL 4 MG/2ML IJ SOLN
INTRAMUSCULAR | Status: AC
  Filled 2022-05-22: qty 2

## 2022-05-22 MED ORDER — LACTATED RINGERS IV SOLN: INTRAVENOUS | Status: DC

## 2022-05-22 MED ORDER — SODIUM CHLORIDE 0.9% FLUSH
3.0000 mL | Freq: Two times a day (BID) | INTRAVENOUS | Status: DC
  Administered 2022-05-23 – 2022-05-24 (×4): 3 mL via INTRAVENOUS

## 2022-05-22 MED ORDER — PANTOPRAZOLE 2 MG/ML SUSPENSION
40.0000 mg | Freq: Every day | ORAL | Status: DC
  Administered 2022-05-23 – 2022-05-24 (×2): 40 mg
  Filled 2022-05-22 (×2): qty 20

## 2022-05-22 MED ORDER — SODIUM CHLORIDE 0.9 % IV SOLN
250.0000 mL | INTRAVENOUS | Status: DC
Start: 2022-05-22 — End: 2022-05-25
  Administered 2022-05-22: 250 mL via INTRAVENOUS

## 2022-05-22 MED ORDER — BUPIVACAINE HCL 0.5 % IJ SOLN
INTRAMUSCULAR | Status: DC | PRN
  Administered 2022-05-22: 4 mL

## 2022-05-22 MED ORDER — PROPOFOL 500 MG/50ML IV EMUL
INTRAVENOUS | Status: DC | PRN
  Administered 2022-05-22: 50 ug/kg/min via INTRAVENOUS

## 2022-05-22 MED ORDER — LIDOCAINE-EPINEPHRINE 1 %-1:100000 IJ SOLN
INTRAMUSCULAR | Status: AC
Start: 2022-05-22 — End: ?
  Filled 2022-05-22: qty 1

## 2022-05-22 MED ORDER — PROPOFOL 1000 MG/100ML IV EMUL
0.0000 ug/kg/min | INTRAVENOUS | Status: DC
  Administered 2022-05-22: 20 ug/kg/min via INTRAVENOUS
  Administered 2022-05-23: 10 ug/kg/min via INTRAVENOUS
  Filled 2022-05-22: qty 100

## 2022-05-22 MED ORDER — SODIUM CHLORIDE 0.9 % IV SOLN: INTRAVENOUS | Status: DC

## 2022-05-22 MED ORDER — ONDANSETRON HCL 4 MG PO TABS: 4.0000 mg | ORAL_TABLET | Freq: Four times a day (QID) | ORAL | Status: DC | PRN

## 2022-05-22 MED ORDER — MENTHOL 3 MG MT LOZG: 1.0000 | LOZENGE | OROMUCOSAL | Status: DC | PRN

## 2022-05-22 MED ORDER — INSULIN ASPART 100 UNIT/ML IJ SOLN: 0.0000 [IU] | Freq: Three times a day (TID) | INTRAMUSCULAR | Status: DC

## 2022-05-22 MED ORDER — PHENYLEPHRINE HCL-NACL 20-0.9 MG/250ML-% IV SOLN
INTRAVENOUS | Status: DC | PRN
  Administered 2022-05-22: 25 ug/min via INTRAVENOUS
  Administered 2022-05-22: 50 ug/min via INTRAVENOUS

## 2022-05-22 MED ORDER — ROCURONIUM BROMIDE 10 MG/ML (PF) SYRINGE
PREFILLED_SYRINGE | INTRAVENOUS | Status: AC
  Filled 2022-05-22: qty 10

## 2022-05-22 MED ORDER — FENTANYL CITRATE PF 50 MCG/ML IJ SOSY: 25.0000 ug | PREFILLED_SYRINGE | INTRAMUSCULAR | Status: DC | PRN

## 2022-05-22 SURGICAL SUPPLY — 74 items
APL SKNCLS STERI-STRIP NONHPOA (GAUZE/BANDAGES/DRESSINGS) ×1
BAG COUNTER SPONGE SURGICOUNT (BAG) ×2 IMPLANT
BAND INSRT 18 STRL LF DISP RB (MISCELLANEOUS) ×2
BAND RUBBER #18 3X1/16 STRL (MISCELLANEOUS) ×4 IMPLANT
BASKET BONE COLLECTION (BASKET) IMPLANT
BENZOIN TINCTURE PRP APPL 2/3 (GAUZE/BANDAGES/DRESSINGS) ×2 IMPLANT
BIT DRILL NEURO 2X3.1 SFT TUCH (MISCELLANEOUS) ×1 IMPLANT
BLADE CLIPPER SURG (BLADE) IMPLANT
BLADE SURG 15 STRL LF DISP TIS (BLADE) IMPLANT
BLADE SURG 15 STRL SS (BLADE)
BLADE ULTRA TIP 2M (BLADE) IMPLANT
BUR CUTTER 5.0 COARSE DIAMOND (BURR) ×1 IMPLANT
BUR MATCHSTICK NEURO 3.0 LAGG (BURR) ×2 IMPLANT
CAGE CERV CAPRI 12X14X20 0D (Cage) ×1 IMPLANT
CANISTER SUCT 3000ML PPV (MISCELLANEOUS) ×2 IMPLANT
DRAPE C-ARM 42X72 X-RAY (DRAPES) ×4 IMPLANT
DRAPE C-ARMOR (DRAPES) ×1 IMPLANT
DRAPE HALF SHEET 40X57 (DRAPES) IMPLANT
DRAPE LAPAROTOMY 100X72 PEDS (DRAPES) ×2 IMPLANT
DRAPE MICROSCOPE LEICA (MISCELLANEOUS) ×2 IMPLANT
DRESSING MEPILEX FLEX 4X4 (GAUZE/BANDAGES/DRESSINGS) ×1 IMPLANT
DRILL NEURO 2X3.1 SOFT TOUCH (MISCELLANEOUS) ×2
DRSG MEPILEX FLEX 4X4 (GAUZE/BANDAGES/DRESSINGS) ×2
DRSG OPSITE 4X5.5 SM (GAUZE/BANDAGES/DRESSINGS) ×4 IMPLANT
DRSG OPSITE POSTOP 4X6 (GAUZE/BANDAGES/DRESSINGS) ×1 IMPLANT
DURAPREP 26ML APPLICATOR (WOUND CARE) ×2 IMPLANT
ELECT COATED BLADE 2.86 ST (ELECTRODE) ×3 IMPLANT
ELECT REM PT RETURN 9FT ADLT (ELECTROSURGICAL) ×2
ELECTRODE REM PT RTRN 9FT ADLT (ELECTROSURGICAL) ×1 IMPLANT
EVACUATOR 1/8 PVC DRAIN (DRAIN) ×1 IMPLANT
GAUZE 4X4 16PLY ~~LOC~~+RFID DBL (SPONGE) IMPLANT
GLOVE BIOGEL PI IND STRL 7.5 (GLOVE) ×1 IMPLANT
GLOVE BIOGEL PI IND STRL 8.5 (GLOVE) IMPLANT
GLOVE BIOGEL PI INDICATOR 7.5 (GLOVE) ×1
GLOVE BIOGEL PI INDICATOR 8.5 (GLOVE) ×1
GLOVE ECLIPSE 7.5 STRL STRAW (GLOVE) ×2 IMPLANT
GLOVE ECLIPSE 8.0 STRL XLNG CF (GLOVE) ×1 IMPLANT
GLOVE SURG ENC MOIS LTX SZ8 (GLOVE) ×2 IMPLANT
GLOVE SURG UNDER POLY LF SZ8.5 (GLOVE) ×2 IMPLANT
GOWN STRL REUS W/ TWL LRG LVL3 (GOWN DISPOSABLE) ×2 IMPLANT
GOWN STRL REUS W/ TWL XL LVL3 (GOWN DISPOSABLE) ×1 IMPLANT
GOWN STRL REUS W/TWL 2XL LVL3 (GOWN DISPOSABLE) IMPLANT
GOWN STRL REUS W/TWL LRG LVL3 (GOWN DISPOSABLE) ×4
GOWN STRL REUS W/TWL XL LVL3 (GOWN DISPOSABLE) ×6
HEMOSTAT POWDER KIT SURGIFOAM (HEMOSTASIS) ×2 IMPLANT
KIT BASIN OR (CUSTOM PROCEDURE TRAY) ×2 IMPLANT
KIT TURNOVER KIT B (KITS) ×2 IMPLANT
NDL SPNL 22GX3.5 QUINCKE BK (NEEDLE) ×1 IMPLANT
NEEDLE HYPO 22GX1.5 SAFETY (NEEDLE) ×2 IMPLANT
NEEDLE SPNL 22GX3.5 QUINCKE BK (NEEDLE) ×2 IMPLANT
NS IRRIG 1000ML POUR BTL (IV SOLUTION) ×2 IMPLANT
PACK LAMINECTOMY NEURO (CUSTOM PROCEDURE TRAY) ×2 IMPLANT
PAD ARMBOARD 7.5X6 YLW CONV (MISCELLANEOUS) ×6 IMPLANT
PIN DISTRACTION 14MM (PIN) ×2 IMPLANT
PLATE CERV OZARK 38 2LVL (Plate) ×1 IMPLANT
PUTTY BONE 100 VESUVIUS 2.5CC (Putty) ×1 IMPLANT
SCREW VA ST OZARK 4X16 (Plate) ×4 IMPLANT
SPIKE FLUID TRANSFER (MISCELLANEOUS) ×2 IMPLANT
SPONGE INTESTINAL PEANUT (DISPOSABLE) ×2 IMPLANT
SPONGE SURGIFOAM ABS GEL SZ50 (HEMOSTASIS) ×2 IMPLANT
STAPLER VISISTAT 35W (STAPLE) IMPLANT
STRIP CLOSURE SKIN 1/2X4 (GAUZE/BANDAGES/DRESSINGS) ×2 IMPLANT
SUT MNCRL AB 4-0 PS2 18 (SUTURE) ×2 IMPLANT
SUT SILK 2 0 TIES 10X30 (SUTURE) IMPLANT
SUT VIC AB 0 CT1 27 (SUTURE)
SUT VIC AB 0 CT1 27XBRD ANTBC (SUTURE) IMPLANT
SUT VIC AB 2-0 CP2 18 (SUTURE) IMPLANT
SUT VIC AB 3-0 SH 8-18 (SUTURE) ×2 IMPLANT
SWAB COLLECTION DEVICE MRSA (MISCELLANEOUS) ×2 IMPLANT
SWAB CULTURE ESWAB REG 1ML (MISCELLANEOUS) ×2 IMPLANT
TAPE CLOTH 3X10 TAN LF (GAUZE/BANDAGES/DRESSINGS) ×2 IMPLANT
TOWEL GREEN STERILE (TOWEL DISPOSABLE) ×2 IMPLANT
TOWEL GREEN STERILE FF (TOWEL DISPOSABLE) ×2 IMPLANT
WATER STERILE IRR 1000ML POUR (IV SOLUTION) ×2 IMPLANT

## 2022-05-22 NOTE — Anesthesia Procedure Notes (Addendum)
  Arterial Line Insertion Start/End06/18/2023 1:20 AM, 06/02/2022 1:29 PM Performed by: Rande Brunt, CRNA, CRNA  Patient location: Pre-op. Preanesthetic checklist: patient identified, IV checked, risks and benefits discussed, surgical consent, monitors and equipment checked, pre-op evaluation, timeout performed and anesthesia consent Lidocaine 1% used for infiltration Left, radial was placed Catheter size: 20 G Hand hygiene performed  and Seldinger technique used  Attempts: 3 Procedure performed using ultrasound guided technique. Ultrasound Notes:anatomy identified, needle tip was noted to be adjacent to the nerve/plexus identified, no ultrasound evidence of intravascular and/or intraneural injection and image(s) printed for medical record Following insertion, dressing applied and Biopatch. Post procedure assessment: normal  Patient tolerated the procedure well with no immediate complications.

## 2022-05-22 NOTE — Progress Notes (Signed)
Placed pt. On bipap per MD per RN.

## 2022-05-22 NOTE — ED Notes (Signed)
Pt placed on bipap by respiratory per Dr. Ralene Bathe, SBP 72 x 2 occurences. Took pt off bipap per Dr. Ralene Bathe, placed on NRB @ 15L.

## 2022-05-22 NOTE — Assessment & Plan Note (Signed)
   While respiratory status is tenuous, no evidence of COPD exacerbation this time  As needed bronchodilator therapy for episodic shortness of breath and wheezing.

## 2022-05-22 NOTE — Assessment & Plan Note (Signed)
   Patient experiencing several week history of ongoing neck pain followed by a 36-hour history of rather rapid development of generalized weakness  MRI images reviewed with radiology and have identified a developing epidural abscess in addition to osteomyelitis and discitis of the area  This is most certainly the cause of the rapidly progressive weakness in this patient.  I discussed these findings both with neurosurgery over the phone as well as face-to-face.  Patient is to go to the operating room today for surgical intervention  Discussed with pharmacy, treating developing abscess with intravenous vancomycin and ceftriaxone.  Spinal cord compression is leading to progressively worsening hypoxia which is being addressed with supplemental oxygen and will be titrated upwards as necessary with the assistance of respiratory  Holding all anticoagulants, SCDs for DVT prophylaxis for now.  Holding tube feeds for now, will resume with the assistance with nutrition once cleared by surgery postoperatively

## 2022-05-22 NOTE — ED Notes (Signed)
Report given to to Mable Fill, Therapist, sports of Short Stay

## 2022-05-22 NOTE — Progress Notes (Signed)
Orthopedic Tech Progress Note Patient Details:  [name] 1941/05/16 [id number]  OR RN called requesting an [place].   Patient ID: [name], male   DOB: 19-Nov-1941, 81 y.o.   MRN: [id number]  [name] 06/05/2022, 4:12 PM

## 2022-05-22 NOTE — Consult Note (Addendum)
[facility] for Infectious Disease    Date of Admission:  05/30/2022     Total days of antibiotics 1               Reason for Consult: Cervical discitis/osteomyelitis and abscess   Referring Provider: Dr. 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 Planned for OR today. Currently on broad spectrum antibiotics with vancomycin and ceftriaxone. Will request cultures by Neurosurgery to direct antibiotic treatment. Continue current dose of vancomycin and ceftriaxone. Therapeutic monitoring of renal function and vancomycin levels. Monitor blood cultures for bacteremia. Remaining medical and supportive care per primary team.   PLAN:  Continue vancomycin and ceftriaxone.  OR today - request cultures if able.  Therapeutic monitoring of renal function and vancomycin levels per protocol. Remaining medical and supportive care per primary team.    Principal Problem:   Abscess in epidural space of cervical spine Active Problems:   Coronary artery disease involving native coronary artery of native heart without angina pectoris   Essential hypertension   COPD (chronic obstructive pulmonary disease) (HCC)   Acute on chronic respiratory failure with hypoxia (HCC)   Type 2 diabetes mellitus without complication, without long-term current use of insulin (HCC)   Chronic combined systolic and diastolic congestive heart failure ([place])   Acute osteomyelitis of cervical spine (HCC)   Discitis of cervical region   Urinary retention   S/P percutaneous endoscopic gastrostomy (PEG) tube placement (HCC)    acetaminophen  1,000 mg Oral Once   [MAR Hold]  atorvastatin  40 mg Per Tube Daily   chlorhexidine  15 mL Mouth/Throat Once   Or   mouth rinse  15 mL Mouth Rinse Once   [MAR Hold] dorzolamide  1 drop Both Eyes BID   [MAR Hold] insulin aspart  0-15 Units Subcutaneous Q4H   [MAR Hold] metoprolol tartrate  12.5 mg Per Tube BID     HPI: [name] is a 81 y.o. male with previous medical history of Type 2 diabetes, COPD on 2L 02, hypertension, CKD, CAD s/p CABG, combined systolic and diastolic heart failure, and recent meningitis and CVA admitted from [facility] with shortness of breath and hypoxia.   [name] was recently hospitalized from 04/25/22- 05/09/22 with H. Influenza meningitis with hospitalization that was complicated by vocal fold paralysis possibly due to stroke; acute on chronic hypoxic respiratory failure, and renal failure. Completed ceftriaxone on 05/12/22. [name] remained at baseline until 2 days prior to presentation with steady decline in overall health with increasing weakness, coughing and shortness of breath. Was hypoxic to the 80's on his baseline oxygen levels.   Chest x-ray with increased interstitial opacities and possible small right plueral effusion. CT head/cervical spine with no acute intracranial abnormality and endplate erosion of the opposing C4-C5 endplates likely representing discititis-osteomyelitis. MRI cervical spine with C4-C5 discitis/osteomyelitis at C4-C5 with ventral epidural abscess and compression of the spinal cord; and prevertebral phlegmon/abscess measuring 82m in thickness.   [name] been afebrile since arrival and has received vancomycin and ceftriaxone. Blood cultures are pending. Neurosurgery planning for surgical intervention.   Review of Systems: Review of Systems  Constitutional:  Negative for chills, fever and weight loss.  Respiratory:  Negative for cough, shortness of breath and wheezing.   Cardiovascular:  Negative for chest pain and leg swelling.   Gastrointestinal:  Negative for abdominal pain, constipation, diarrhea, nausea and vomiting.  Musculoskeletal:  Positive for neck pain.  Skin:  Negative for rash.    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[place])    EF 40% with apical AK at cath intolerant to ACE I and ARBS, EF 43% by nuclear stress test 08/2010   Kidney stones    Macular degeneration    Morbid obesity ([place]) 04/28/2018    Social History   Tobacco Use   Smoking status: Former    Types: Cigarettes    Quit date: 10/25/2001    Years since quitting: 20.5   Smokeless tobacco: Never  Vaping Use   Vaping Use: Never used  Substance Use Topics   Alcohol use: Yes    Comment: occasional beer and wine   Drug use: No    Family History  Problem Relation Age of Onset   Hypertension Father    Breast cancer Father    CVA Father    Diabetes Mother    Hypertension Mother    CAD Sister    Hypertension Sister    Diabetes Sister    Heart disease Brother    Diabetes Brother    Breast cancer Sister    Hypertension Sister    Diabetes Sister    Heart disease Sister     No Known Allergies  OBJECTIVE: Blood pressure 117/61, pulse (!) 105, temperature (!) 97.5 F (36.4 C), temperature source Oral, resp. rate 18, height '5\' 6"'$  (1.676 m), weight 88.4 kg, SpO2 98 %.  Physical Exam Constitutional:       General: He is not in acute distress.    Appearance: He is well-developed. He is ill-appearing.     Interventions: Nasal cannula in place.  Cardiovascular:     Rate and Rhythm: Regular rhythm. Tachycardia present.     Heart sounds: Normal heart sounds.  Pulmonary:     Effort: Pulmonary effort is normal.     Breath sounds: Normal breath sounds.  Skin:    General: Skin is warm and dry.  Neurological:     Mental Status: He is alert and oriented to person, place, and time.  Psychiatric:        Behavior: Behavior normal.        Thought Content: Thought content normal.        Judgment: Judgment normal.    Lab Results Lab Results  Component Value Date   WBC 8.0 05/26/2022   HGB 9.6 (L) 06/11/2022   HCT 30.3 (L) 06/13/2022   MCV 91.5 05/16/2022   PLT 196 05/27/2022    Lab Results  Component Value Date   CREATININE 0.69 06/12/2022   BUN 23 05/27/2022   NA 136 05/25/2022   K 3.9 05/18/2022   CL 103 05/19/2022   CO2 25 05/31/2022    Lab Results  Component Value Date   ALT 23 05/31/2022   AST 17 05/25/2022   ALKPHOS 95 05/20/2022   BILITOT 0.9 05/20/2022     Microbiology: No results found for this or any previous visit (from the past 240 hour(s)).   Terri Piedra, NP [facility]  06/14/2022  12:40 PM

## 2022-05-22 NOTE — Assessment & Plan Note (Signed)
   Foley catheter in place, management per protocol

## 2022-05-22 NOTE — Progress Notes (Signed)
Pharmacy Antibiotic Note  [name] is a 81 y.o. male admitted on 05/31/2022 with  discitis/epidural abscess .  Pharmacy has been consulted for Vancomycin dosing. Recent Hflu meningitis and ?strep anginosus bacteremia.   Plan: Vancomycin 1500 mg IV x 1, then 750 mg IV q12h >>>Estimated AUC: 555 Ceftriaxone per MD Trend WBC, temp, renal function  F/U infectious work-up Drug levels as indicated   Temp (24hrs), Avg:98.6 F (37 C), Min:98.1 F (36.7 C), Max:99 F (37.2 C)  Recent Labs  Lab 05/23/2022 2313 06/08/2022 0217  WBC 8.7  --   CREATININE 0.70  --   LATICACIDVEN  --  0.8    Estimated Creatinine Clearance: 79.2 mL/min (by C-G formula based on SCr of 0.7 mg/dL).    No Known Allergies  Narda Bonds, PharmD, BCPS Clinical Pharmacist Phone: [phone]

## 2022-05-22 NOTE — Progress Notes (Signed)
Neurosurgery  81 yo M with COPD, CHF recently developed meningitis with subsequent CVA, required PEG placement.  He had been having neck pain for several months.  He was improving in strength from his extended hospitalization last month but over the past several days, noted rapid loss of strength in his upper and lower extremities.  Yesterday he could not lift his legs or use his arms.  In the ER, C4-5 discitis with ventral epidural infection was found, with significant cord compression and extensive edema.  There was also focal kyphosis at C4-5.  I had a long discussion with the wife.  I told her he is at elevated risk with surgery with his medical comorbidities and it is unclear how much function he will regain even with surgery.  Alternative of palliative care was discussed given the elevated risks and uncertain recovery.  Since the patient was doing well just a few days ago, she wished to proceed with aggressive treatment which is reasonable.  I discussed the general technique of C4 corpectomy and reconstruction. The patient already is dysphonic and has dysphagia with a feeding tube.  I explained that the prognosis of recovery of his dysphagia and dysphonia is poor after this surgery.  With his underlying COPD and spinal cord injury, he may need to remain intubated after surgery, which she and patient were okay with despite the DNR.  Risks, benefits, alternatives, and expected convalescence was discussed.  Risks discussed, included, but were not limited to, bleeding, pain, infection, scar, dysphagia, dysphonia, pseudoarthrosis, recurrent infection, instability, and death. Informed consent was obtained.  We will arrange for surgery as soon as possible.

## 2022-05-22 NOTE — Op Note (Signed)
PREOP DIAGNOSIS: Cervical discitis/osteomyelitis with epidural abscess  POSTOP DIAGNOSIS: Cervical discitis/osteomyelitis with epidural abscess  PROCEDURE: 1. C4 corpectomy for evacuation of epidural abscess and resection of phlegmon, including bilateral foraminotomies 2. Arthrodesis C3-4-5, anterior interbody technique 3. Placement of intervertebral expandable corpectomy cage 4. Anterior cervical plating C3-4-5 5. Use of morselized bone allograft  6. Use of intraoperative microscope 7. 22 modifier for increased difficulty of exposure due to significant anatomic distortion from paravertebral phlegmonotous changes  SURGEON: Dr. Duffy Rhody, MD  ASSISTANT: Weston Brass, NP.  Please note, no qualified trainees were available to assist with the procedure.  Assistance was required for retraction of the visceral structures to safely allow for instrumentation.  ANESTHESIA: General Endotracheal  EBL: 100 ml  IMPLANTS: Stryker 38 mm Ozark plate 4.0 x 79TJ variable screws x 4 20-79m expandable cervical cage  SPECIMENS: None  DRAINS: None  COMPLICATIONS: None immediate  CONDITION: Hemodynamically stable to PACU  HISTORY: This is a 81year old man recently hospitalized for H. influenzae meningitis who developed worsening neck pain during recovery from his previous illness.  Over the past few days, he developed rapid loss of strength in his upper extremities and lower extremities and was not ambulatory yesterday.  He came to the ER and he had 2 out of 5 strength in his lower extremities, and flicker of movement in his hands, biceps, triceps, with 2 out of 5 strength in deltoids.  He was found to have severe C4-5 discitis with significant erosion of C4 and a large epidural abscess and phlegmon and severe cord impingement with extensive cord signal change and edema.  He was severely dysphonic and had severe dysphagia for which he had a PEG tube already.  I had a long discussion with the  patient and wife. risks, benefits, alternatives, and expected convalescence were discussed with the patient.  Risks discussed included but were not limited to bleeding, pain, infection, dysphagia, dysphonia, pseudoarthrosis, hardware failure, adjacent segment disease, CSF leak, neurologic deficits, weakness, numbness, paralysis, coma, and death. After all questions were answered, informed consent was obtained.  PROCEDURE IN DETAIL: The patient was brought to the operating room and transferred to the operative table. After induction of general anesthesia, the patient was positioned on the operative table in the supine position with all pressure points meticulously padded. The skin of the neck was then prepped and draped in the usual sterile fashion.  After timeout was conducted, the skin was infiltrated with local anesthetic. Skin incision was then made sharply and Bovie electrocautery was used to dissect the subcutaneous tissue until the platysma was identified. The platysma was then divided and undermined. The sternocleidomastoid muscle was then identified and the deep investing cervical fascia was opened.  The carotid sheath was identified and medial to this, in the prevertebral space, there was severe edema and phlegmonotous changes with complete distortion of the anatomy.  There was fairly minimal mobility of the visceral structures due to the significant phlegmonotous changes.  A fair amount of sharp dissection needed to be performed in order to mobilize the midline visceral structures.  Longus coli tendons were able to be identified to orient to midline.  using fluoroscopy, the collapsed C4-5 disc space was identified.  A fair amount of pus emanated from the disc space after opening the prevertebral fascia.  Bovie electrocautery was used to dissect in the subperiosteal plane and elevate the bilateral longus coli muscles. Self-retaining retractors were then placed. Caspar distraction pins were placed in  the C3 and C5 bodies  to allow for gentle distraction.  At this point, the microscope was draped and brought into the field, and the remainder of the case was done under the microscope using microdissecting technique.  The C3-4 disc space was incised sharply and combination of high speed drill, curettes, and rongeurs were use to initially complete a discectomy. The high-speed drill was then used to complete discectomy until the posterior annulus was identified and removed and the posterior longitudinal ligament was identified.  The remaining C4-5 disc was removed with rongeurs to the PLL.  The C4 corpectomy was performed with rongeurs and high-speed drill.  The superficial layer of the epidural phlegmon was then opened and large amount of purulence was evacuated, including pus behind the C5 body and behind the C3 body.  The deep surface of the phlegmon was then dissected from the thecal sac and resected at C4.  Good pulsation of the cervical spinal cord and good decompression was noted.  Bilateral foraminotomies were performed with rongeurs.    Having completed our decompression, attention was turned to placement of the intervertebral device. Trial spacers were used to select an appropriately sized expandable cage.  This cage was then filled with morcellized allograft, and inserted under live fluoroscopy and expanded to a proper height, with adjustment of the inferior endcap lordosis for good apposition with the endplate.  After placement of the intervertebral devices, the caspar pins were removed.  An anterior cervical plate was placed across the interspaces for anterior fixation.  Using a high-speed drill, the cortex of the cervical vertebral bodies was punctured, and screws inserted in the vertebral bodies. Final fluoroscopic images in AP and lateral projections were taken to confirm good hardware placement.  At this point, after all counts were verified to be correct, meticulous hemostasis was secured  using a combination of bipolar electrocautery and passive hemostatics.  A medium Hemovac drain was placed in the deep cervical space and tunneled out the skin and secured with a stitch.  The platysma muscle was then closed using interrupted 3-0 Vicryl sutures, and the skin was closed with a 4-0 monocryl in subcutical fashion. Sterile dressings were then applied and the drapes removed.  The patient tolerated the procedure well and was kept intubated and transferred to the ICU in critical condition..  All counts were correct at the end of the procedure.

## 2022-05-22 NOTE — Anesthesia Procedure Notes (Signed)
Procedure Name: Intubation Date/Time: 06/01/2022 2:17 PM Performed by: Rande Brunt, CRNA Pre-anesthesia Checklist: Patient identified, Emergency Drugs available, Suction available and Patient being monitored Patient Re-evaluated:Patient Re-evaluated prior to induction Oxygen Delivery Method: Circle System Utilized Preoxygenation: Pre-oxygenation with 100% oxygen Induction Type: IV induction Ventilation: Mask ventilation without difficulty Grade View: Grade I Tube type: Oral Tube size: 7.5 mm Number of attempts: 1 Airway Equipment and Method: Stylet and Oral airway Placement Confirmation: ETT inserted through vocal cords under direct vision, positive ETCO2 and breath sounds checked- equal and bilateral Secured at: 22 cm Tube secured with: Tape Dental Injury: Teeth and Oropharynx as per pre-operative assessment

## 2022-05-22 NOTE — H&P (Signed)
History and Physical    Patient: [name] MRN: [id number] DOA: 05/31/2022  Date of Service: the patient was seen and examined on 06/14/2022  Patient coming from: SNF  Chief Complaint:  Chief Complaint  Patient presents with   Shortness of Breath    HPI:   81 year old male with past medical history of COPD, chronic hypoxic respiratory failure (on 3 lpm via ), systolic and diastolic congestive heart failure (Echo 01/2022 EF 50-55%, Echo 2019 with G1DD), coronary artery disease (S/P CABG in 2002), hypertension, hyperlipidemia, non-insulin-dependent diabetes mellitus type 2 (Hgb A1C 6.5% 04/26/2022) and 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  Unable to obtain history from patient due to aphasia due to vocal cord paralysis.  History is been obtained from emergency department staff, ED/triage notes and discussions with family.  Wife reports that in the 36 hours the patient has suddenly developed generalized severe weakness.  This has rapidly worsened over the span of time and has become associated with progressively worsening coughing and complaints of shortness of breath.  Patient developed recurrent bouts of hypoxia on his baseline 3 L of oxygen requiring eventual increase to 6 L/min.  SNF staff tried a trial dose of extra Lasix after patient was found to have pulmonary edema on chest x-ray with no improvement in symptoms.   Due to patient's progressively worsening symptoms and worsening hypoxia EMS was eventually contacted.  EMS promptly came to evaluate the patient and due to persisting hypoxia placed the patient on a nonrebreather mask.  Patient was probably brought into  [facility] emergency department for evaluation.  Upon evaluation in the emergency department CT imaging of the head and cervical spine were performed revealing no acute intracranial abnormality but instead finding progressive endplate erosions of the C4-5 endplates with prevertebral soft tissue swelling extending from C1-C5 indicating a discitis and osteomyelitis.  Radiology recommended obtaining an MRI spine.  ER provider has now called the hospitalist group to assess patient for admission to the hospital.  Review of Systems: Review of Systems  Respiratory:  Positive for cough and shortness of breath.   Neurological:  Positive for weakness.  All other systems reviewed and are negative.   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 Radford Pax   IR [place] MOD SED  05/07/2022   urologic procedure for fertility      Social History:  reports that he quit smoking about 20 years ago. His smoking use included cigarettes. He has  never used smokeless tobacco. He reports current alcohol use. He reports that he does not use drugs.  No Known Allergies  Family History  Problem Relation Age of Onset   Hypertension Father    Breast cancer Father    CVA Father    Diabetes Mother    Hypertension Mother    CAD Sister    Hypertension Sister    Diabetes Sister    Heart disease Brother    Diabetes Brother    Breast cancer Sister    Hypertension Sister    Diabetes Sister    Heart disease Sister     Prior to Admission medications   Medication Sig Start Date End Date Taking? Authorizing Provider  acetaminophen (TYLENOL) 160 MG/5ML solution Place 20.3 mLs (650 mg total) into feeding tube every 4 (four) hours as needed for headache or mild pain. 05/09/22   Shawna Clamp, MD  albuterol (PROVENTIL HFA;VENTOLIN HFA) 108 (90 Base) MCG/ACT inhaler Inhale 1-2 puffs into the lungs every 6 (six) hours as needed for wheezing or shortness of breath. Patient taking differently: Inhale 2 puffs into the lungs every 4 (four) hours as needed for wheezing or shortness of breath. 03/05/17   Reyne Dumas, MD  amLODipine (NORVASC) 10 MG tablet Place 1 tablet (10 mg total) into feeding tube daily. 05/09/22   Shawna Clamp, MD  atorvastatin (LIPITOR) 40 MG tablet Place 1 tablet (40 mg total) into feeding tube daily. 05/09/22   Shawna Clamp, MD  carbamide peroxide (DEBROX) 6.5 % OTIC solution Place 5 drops into both ears daily as needed (wax buildup).    [provider]  dorzolamide (TRUSOPT) 2 % ophthalmic solution Place 1 drop into both eyes 2 (two) times daily.  01/24/20   [provider]  furosemide (LASIX) 40 MG tablet Place 1-2 tablets (40-80 mg total) into feeding tube daily as needed for edema or fluid. 05/09/22   Shawna Clamp, MD  metoprolol succinate (TOPROL-XL) 25 MG 24 hr tablet Take 1 tablet (25 mg total) by mouth daily. 05/09/22   Shawna Clamp, MD  Multiple Vitamin (MULTIVITAMIN) capsule Take 1 capsule by mouth  daily.    [provider]  Nutritional Supplements (FEEDING SUPPLEMENT, GLUCERNA 1.5 CAL,) LIQD Place 1,000 mLs into feeding tube continuous. 05/09/22   Shawna Clamp, MD  silodosin (RAPAFLO) 8 MG CAPS capsule Place 1 capsule (8 mg total) into feeding tube daily with supper. 05/09/22   Shawna Clamp, MD    Physical Exam:  Vitals:   06/03/2022 0715 06/06/2022 0730 05/20/2022 0745 06/04/2022 0800  BP: 99/61 97/60 (!) 100/58 100/62  Pulse: (!) 103 100 (!) 102 (!) 107  Resp: '15 15 16 '$ (!) 22  Temp:      TempSrc:      SpO2: 96% 95% 95% 94%    Constitutional: Awake and alert but nonverbal, patient is not in any acute distress. Skin: no rashes, no lesions, poor skin turgor noted. Eyes: Pupils are equally reactive to light.  No evidence of scleral icterus or conjunctival pallor.  ENMT: Somewhat dry mucous membranes noted.  Posterior pharynx clear of any  exudate or lesions.   Neck: normal, supple, no masses, no thyromegaly.  No evidence of jugular venous distension.   Respiratory: Scattered rhonchi bilaterally with mild bibasilar rales.  Poor inspiratory effort noted.  No evidence of wheezing.  Normal respiratory effort. No accessory muscle use.  Cardiovascular: Regular rate and rhythm, no murmurs / rubs / gallops. No extremity edema. 2+ pedal pulses. No carotid bruits.  Chest:   Nontender without crepitus or deformity.   Back:   Nontender without crepitus or deformity. Abdomen: PEG tube noted to be in place.  Abdomen is soft and nontender.  No evidence of intra-abdominal masses.  Positive bowel sounds noted in all quadrants.   Musculoskeletal: No joint deformity upper and lower extremities. Good ROM, no contractures. Normal muscle tone.  Neurologic: 2 out of 5 strength noted of the bilateral upper and lower extremities with even less strength noted of the distal muscle groups.  Sensation is grossly intact.  Patient is able to follow all commands when able.  CN 2-12 grossly intact. patient is  currently aphasic.   Psychiatric: Unable to fully assess due to aphasia.  Patient does seem to currently possess insight as to his current situation.  Data Reviewed:  I have personally reviewed and interpreted labs, imaging.  Significant findings are:  Chemistry revealing sodium 136, potassium 3.9, BUN 25, creatinine 0.7 BNP 144 2 sets of troponins have been obtained and are 9 and 8. Venous blood gas reveals pH of 7.53, PCO2 32, PO2 103 CBC revealing white blood cell count of 8.7, hemoglobin 9.8, hematocrit 34.1, platelet count of 204 Noncontrast CT imaging of the head revealing no acute intracranial abnormality. CT of the cervical spine without contrast revealing progressive endplate erosion of the opposing C4-5 endplates with prevertebral soft tissue swelling extending from C1-C5.  Indicating discitis-osteomyelitis.  Given the upper and lower extremity weakness an MRI of the cervical spine has been recommended by radiology.  EKG: Personally reviewed.  Rhythm is sinus tachycardia with heart rate of 116 bpm.  No dynamic ST segment changes appreciated.   Assessment and Plan: * Abscess in epidural space of cervical spine Patient experiencing several week history of ongoing neck pain followed by a 36-hour history of rather rapid development of generalized weakness MRI images reviewed with radiology and have identified a developing epidural abscess in addition to osteomyelitis and discitis of the area This is most certainly the cause of the rapidly progressive weakness in this patient. I discussed these findings both with neurosurgery over the phone as well as face-to-face.  Patient is to go to the operating room today for surgical intervention Discussed with pharmacy, treating developing abscess with intravenous vancomycin and ceftriaxone. Spinal cord compression is leading to progressively worsening hypoxia which is being addressed with supplemental oxygen and will be titrated upwards as  necessary with the assistance of respiratory Holding all anticoagulants, SCDs for DVT prophylaxis for now. Holding tube feeds for now, will resume with the assistance with nutrition once cleared by surgery postoperatively  Discitis of cervical region Please see assessment and plan above.  Acute osteomyelitis of cervical spine (HCC) Please see assessment and plan above  Type 2 diabetes mellitus without complication, without long-term current use of insulin ([place]) Patient been placed on Accu-Cheks every 4 hours with sliding scale insulin for now Hemoglobin A1C 6.5% 5/12    Acute on chronic respiratory failure with hypoxia [facility]) Patient exhibiting increasing oxygen requirements likely due to decreased respiratory effort from spinal cord compression Patient requires close clinical  monitoring in the progressive unit Titrating supplemental oxygen as necessary for bouts of hypoxia Low threshold to proceed to intensive care unit transfer for intubation if necessary  COPD (chronic obstructive pulmonary disease) ([place]) While respiratory status is tenuous, no evidence of COPD exacerbation this time As needed bronchodilator therapy for episodic shortness of breath and wheezing.   Chronic combined systolic and diastolic congestive heart failure (HCC) No clinical evidence of cardiogenic volume overload Strict input and output monitoring Daily weights Low-sodium diet   Coronary artery disease involving native coronary artery of native heart without angina pectoris Patient is currently chest pain free Monitoring patient on telemetry Continue home regimen of lipid lowering therapy and AV nodal blocking therapy   Essential hypertension Low normal blood pressures noted Holding home regimen of amlodipine, continuing low-dose metoprolol PRN intravenous antihypertensives for excessively elevated blood pressure    S/P percutaneous endoscopic gastrostomy (PEG) tube placement ([place]) Holding tube  feeds for now as we await operative intervention We will resume tube feeds postoperatively with the assistance of nutrition  Urinary retention Foley catheter in place, management per protocol       Code Status:  DNR  code status decision has been confirmed with: patient/wife Family Communication: Wife is at bedside and has been updated on plan of care.  Consults: Neurosurgery  Severity of Illness:  The appropriate patient status for this patient is INPATIENT. Inpatient status is judged to be reasonable and necessary in order to provide the required intensity of service to ensure the patient's safety. The patient's presenting symptoms, physical exam findings, and initial radiographic and laboratory data in the context of their chronic comorbidities is felt to place them at high risk for further clinical deterioration. Furthermore, it is not anticipated that the patient will be medically stable for discharge from the hospital within 2 midnights of admission.   * I certify that at the point of admission it is my clinical judgment that the patient will require inpatient hospital care spanning beyond 2 midnights from the point of admission due to high intensity of service, high risk for further deterioration and high frequency of surveillance required.*  Author:  Vernelle Emerald MD  05/26/2022 9:17 AM

## 2022-05-22 NOTE — Progress Notes (Signed)
Pt received from OR on vent, prop, neo, VSS

## 2022-05-22 NOTE — Assessment & Plan Note (Signed)
·   Please see assessment and plan above °

## 2022-05-22 NOTE — Assessment & Plan Note (Signed)
.   Patient is currently chest pain free . Monitoring patient on telemetry . Continue home regimen of lipid lowering therapy and AV nodal blocking therapy

## 2022-05-22 NOTE — Assessment & Plan Note (Signed)
.   Patient been placed on Accu-Cheks every 4 hours with sliding scale insulin for now . Hemoglobin A1C 6.5% 5/12

## 2022-05-22 NOTE — Assessment & Plan Note (Signed)
No clinical evidence of cardiogenic volume overload Strict input and output monitoring Daily weights Low-sodium diet  

## 2022-05-22 NOTE — Assessment & Plan Note (Signed)
.   Low normal blood pressures noted . Holding home regimen of amlodipine, continuing low-dose metoprolol . PRN intravenous antihypertensives for excessively elevated blood pressure

## 2022-05-22 NOTE — Assessment & Plan Note (Signed)
   Patient exhibiting increasing oxygen requirements likely due to decreased respiratory effort from spinal cord compression  Patient requires close clinical monitoring in the progressive unit  Titrating supplemental oxygen as necessary for bouts of hypoxia  Low threshold to proceed to intensive care unit transfer for intubation if necessary

## 2022-05-22 NOTE — Anesthesia Preprocedure Evaluation (Addendum)
Anesthesia Evaluation    Airway Mallampati: I  TM Distance: >3 FB Neck ROM: Full    Dental  (+) Edentulous Upper, Edentulous Lower   Pulmonary COPD,  COPD inhaler, former smoker,  requiring FM O2   breath sounds clear to auscultation       Cardiovascular hypertension, Pt. on medications and Pt. on home beta blockers (-) angina+ CAD, + CABG and +CHF   Rhythm:Regular Rate:Normal  01/2022 ECHO: EF 50-55%. The LV has low normal function, diastolic parameters were normal. RVF is normal. Suspicious for small PFO. Mild MR. Moderate mitral annular calcification.   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  Yesterday he could not lift his legs or use his arms.  In the ER, C4-5 discitis with ventral epidural infection was found, with significant cord compression and extensive edema.  There was also focal kyphosis at C4-5.  CVA    GI/Hepatic Neg liver ROS, dysphagia   Endo/Other  diabetes (glu 110)Morbid obesity  Renal/GU Renal InsufficiencyRenal disease     Musculoskeletal  (+) Arthritis ,   Abdominal (+) + obese,   Peds  Hematology  (+) Blood dyscrasia (Hb 9.6), anemia ,   Anesthesia Other Findings   Reproductive/Obstetrics                          Anesthesia Physical Anesthesia Plan  ASA: 4  Anesthesia Plan: General   Post-op Pain Management: Ofirmev IV (intra-op)*   Induction: Intravenous  PONV Risk Score and Plan: 2 and Ondansetron and Dexamethasone  Airway Management Planned: Oral ETT and Video Laryngoscope  Planned  Additional Equipment: Arterial line  Intra-op Plan:   Post-operative Plan: Post-operative intubation/ventilation  Informed Consent: I have reviewed the patients History and Physical, chart, labs and discussed the procedure including the risks, benefits and alternatives for the proposed anesthesia with the patient or authorized representative who has indicated his/her understanding and acceptance.   Patient has DNR.  Discussed DNR with patient, Suspend DNR and Discussed DNR with power of attorney.   Dental advisory given and Consent reviewed with POA  Plan Discussed with: CRNA and Surgeon  Anesthesia Plan Comments:        Anesthesia Quick Evaluation

## 2022-05-22 NOTE — Consult Note (Addendum)
NAME:  [name], MRN:  [id number], DOB:  [birth year], LOS: 0 ADMISSION DATE:  06/03/2022, CONSULTATION DATE:  06/10/2022 REFERRING MD:  Cyndia Skeeters, CHIEF COMPLAINT:  Management post op epidural abscess   History of Present Illness:  [name] is a 81 y.o. 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  Per family, pt developed rapidly worsening weakness in all extremities and cough over the last 1-2 days.  EMS was called and pt required non-rebreather mask en route to the ED.       ED work-up revealed finding progressive endplate erosions of the C4-5 endplates with prevertebral soft tissue swelling extending from C1-C5 indicating a discitis and osteomyelitis on CT scan.  Neurosurgery was consulted and family opted for aggressive treatment with of C4 corpectomy and reconstruction.    He was intubated and to be transferred to ICU post-op.   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  Significant Hospital Events: Including procedures, antibiotic start and stop dates in addition to other pertinent events   6/7 Presented with new quadriplegia and found to have epidural abscess, to OR for of C4 corpectomy and reconstruction, left intubated and PCCM consulted, ID consulted  Interim History /  Subjective:  Sedated on vent. Vitals stable.  Objective   Blood pressure 117/61, pulse 85, temperature (!) 97.5 F (36.4 C), temperature source Oral, resp. rate 18, height '5\' 6"'$  (1.676 m), weight 88.4 kg, SpO2 100 %.    Vent Mode: PRVC FiO2 (%):  [15 %-100 %] 100 % Set Rate:  [18 bmp] 18 bmp Vt Set:  [510 mL] 510 mL PEEP:  [5 cmH20] 5 cmH20   Intake/Output Summary (Last 24 hours) at 05/26/2022 1807 Last data filed at 05/20/2022 1744 Gross per 24 hour  Intake 1800 ml  Output 925 ml  Net 875 ml   Filed Weights   05/30/2022 1229  Weight: 88.4 kg    Examination: General: Adult male, resting in bed, in NAD. Neuro: Sedated, not responsive. HEENT: Anterior neck dressing C/D/I with JP drain in place. Hard neck collar in place. Sclerae anicteric. ETT in place. Cardiovascular: RRR, no M/R/G.  Lungs: Respirations even and unlabored.  CTA bilaterally, No W/R/R. Abdomen: BS x 4, soft, NT/ND.  Musculoskeletal: No gross deformities, no edema.  Skin: Intact, warm, no rashes.   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 Quadriplegia, suspect secondary to abscess. HX CVA (5/11), Left posterior frontotemporal region Recent hx of bacterial meningitis.  -ID following. Appreciate assistance. Continue vancomycin and ceftriaxone. -Follow up cultures -MAP goals per NSGY -Follow up cultures -Post op mgmt per NSGY -PT/OT once extubated  Acute on chronic respiratory failure with hypoxia, remained intubated post procedure. On 3L [place] PTA. COPD HX vocal cord paralysis -Admit to ICU -Full vent support -Wean as able -VAP bundle -Daily SAT and SBT -Follow  CXR  Hypotension - suspect sedation related. -Wean sedation as does not need current dose of 50 propofol -Continue neosynephrine as needed to support MAP > 65, anticipate will be able to come off once sedation is weaned some  CAD Chronic combined systolic and diastolic CHF HTN ECHO 0/26 EF 50-55%, Troponin 8, BNP 144 -Continue statin -Holding home antihypertensives -PRN hydralazine for HTN  DM2 -SSI  PEG tube in place -continue feeding   Urinary retention -continue foley  Hypomag -replete -recheck in AM  GOC DNR Patient remains a DNR, waived for procedure, [place] discussions ongoing -Continue DNR  Best Practice (right click and "Reselect all SmartList Selections" daily)   Diet/type: tubefeeds DVT prophylaxis: SCD GI prophylaxis: PPI Lines: N/A Foley:  Yes, and it is still needed Code Status:  DNR Last date of multidisciplinary goals of care discussion [Pending]  Labs   CBC: Recent Labs  Lab 05/29/2022 2313 05/31/2022 2320 05/17/2022 0424  WBC 8.7  --  8.0  NEUTROABS 6.3  --  6.2  HGB 10.8* 10.9* 9.6*  HCT 34.1* 32.0* 30.3*  MCV 90.9  --  91.5  PLT 204  --  378    Basic Metabolic Panel: Recent Labs  Lab 05/27/2022 2313 06/12/2022 2320 06/02/2022 0424  NA 136 134* 136  K 3.9 3.8 3.9  CL 99  --  103  CO2 25  --  25  GLUCOSE 122*  --  132*  BUN 25*  --  23  CREATININE 0.70  --  0.69  CALCIUM 8.5*  --  8.3*  MG 1.7  --  1.8   GFR: Estimated Creatinine Clearance: 79.2 mL/min (by C-G formula based on SCr of 0.69 mg/dL). Recent Labs  Lab 06/12/2022 2313 05/20/2022 0217 06/10/2022 0424  WBC 8.7  --  8.0  LATICACIDVEN  --  0.8  --     Liver Function Tests: Recent Labs  Lab 06/04/2022 2313 06/14/2022 0424  AST 18 17  ALT 25 23  ALKPHOS 104 95  BILITOT 0.9 0.9  PROT 6.0* 5.7*  ALBUMIN 2.7* 2.4*   No results for input(s): LIPASE, AMYLASE in the last 168 hours. No results for input(s): AMMONIA in the last 168 hours.  ABG    Component Value Date/Time    PHART 7.52 (H) 04/28/2022 0538   PCO2ART 44 04/28/2022 0538   PO2ART 63 (L) 04/28/2022 0538   HCO3 27.1 06/08/2022 2320   TCO2 28 06/06/2022 2320   O2SAT 99 05/23/2022 2320     Coagulation Profile: No results for input(s): INR, PROTIME in the last 168 hours.  Cardiac Enzymes: No results for input(s): CKTOTAL, CKMB, CKMBINDEX, TROPONINI in the last 168 hours.  HbA1C: Hgb A1c MFr Bld  Date/Time Value Ref Range Status  04/26/2022 02:45 AM 6.5 (H) 4.8 - 5.6 % Final    Comment:    (NOTE) Pre diabetes:          5.7%-6.4%  Diabetes:              >6.4%  Glycemic control for   <7.0% adults with diabetes   03/02/2017 08:17 PM 6.1 (H) 4.8 - 5.6 % Final    Comment:    (NOTE)         Pre-diabetes: 5.7 - 6.4  Diabetes: >6.4         Glycemic control for adults with diabetes: <7.0     CBG: Recent Labs  Lab 05/30/2022 0535 06/13/2022 1234  GLUCAP 131* 110*    Review of Systems:   Unable to obtain ROS due to patient status  Past Medical History:  He,  has a past medical history of Anxiety, 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   Surgical History:   Past Surgical History:  Procedure Laterality Date   CORONARY ARTERY BYPASS GRAFT     with LIMA to LAD, SVG to diag, SVG to OM, SVG to RCA cath 2002 Dr. Radford Pax   IR [place] MOD SED  05/07/2022   urologic procedure for fertility       Social History:   reports that he quit smoking about 20  years ago. His smoking use included cigarettes. He has never used smokeless tobacco. He reports current alcohol use. He reports that he does not use drugs.   Family History:  His family history includes Breast cancer in his father and sister; CAD in his sister; CVA in his father; Diabetes in his brother, mother, sister, and sister; Heart disease in his brother and sister; Hypertension in his father, mother, sister, and sister.   Allergies No Known Allergies   Home Medications  Prior to Admission medications   Medication Sig Start Date End Date Taking? Authorizing Provider  acetaminophen (TYLENOL) 160 MG/5ML solution Place 20.3 mLs (650 mg total) into feeding tube every 4 (four) hours as needed for headache or mild pain. 05/09/22  Yes Shawna Clamp, MD  acetaminophen (TYLENOL) 325 MG tablet Place 650 mg into feeding tube every 6 (six) hours as needed for mild pain.   Yes [provider]  albuterol (PROVENTIL HFA;VENTOLIN HFA) 108 (90 Base) MCG/ACT inhaler Inhale 1-2 puffs into the lungs every 6 (six) hours as needed for wheezing or shortness of breath. Patient taking differently: Inhale 2 puffs into the lungs every 4 (four) hours as needed for wheezing or shortness of breath. 03/05/17  Yes Reyne Dumas, MD  amLODipine (NORVASC) 10 MG tablet Place 1 tablet (10 mg total) into feeding tube daily. 05/09/22  Yes Shawna Clamp, MD  atorvastatin (LIPITOR) 40 MG tablet Place 1 tablet (40 mg total) into feeding tube daily. 05/09/22  Yes Shawna Clamp, MD  carbamide peroxide (DEBROX) 6.5 % OTIC solution Place 5 drops into both ears daily as needed (wax buildup).   Yes [provider]  dorzolamide (TRUSOPT) 2 % ophthalmic solution Place 1 drop into both eyes 2 (two) times daily.  01/24/20  Yes [provider]  furosemide (LASIX) 40 MG tablet Place 1-2 tablets (40-80 mg total) into feeding tube daily as needed for edema or fluid. Patient taking differently: Place 40 mg into feeding  tube daily as needed for edema or fluid. 05/09/22  Yes Shawna Clamp, MD  Magnesium 400 MG TABS Take 400 mg by mouth daily.   Yes [provider]  metoprolol succinate (TOPROL-XL) 25 MG 24 hr tablet Take 1 tablet (25 mg total) by mouth daily. Patient taking differently: Take 12.5 mg by mouth 2 (two) times daily. Via  tube 05/09/22  Yes Shawna Clamp, MD  Multiple Vitamin (MULTIVITAMIN) capsule Take 1 capsule by mouth daily.   Yes [provider]  Nutritional Supplements (FEEDING SUPPLEMENT, GLUCERNA 1.5 CAL,) LIQD Place 1,000 mLs into feeding tube continuous. 05/09/22  Yes Shawna Clamp, MD  silodosin (RAPAFLO) 8 MG CAPS capsule Place 1 capsule (8 mg total) into feeding tube daily with supper. 05/09/22  Yes Shawna Clamp, MD     CC time: 40 minutes    Montey Hora, [facility] For pager details, please see AMION or use Epic chat  After 1900, please call [facility] for cross coverage needs 06/04/2022, 6:07 PM

## 2022-05-22 NOTE — Transfer of Care (Signed)
Immediate Anesthesia Transfer of Care Note  Patient: [name]  Procedure(s) Performed: ANTERIOR CERVICAL FOUR CORPECTOMY WITH ANTERIOR CERVICAL THREE TO FIVE DECOMPRESSION/DISCECTOMY FUSION 2 LEVELS  Patient Location: ICU  Anesthesia Type:General  Level of Consciousness: Patient remains intubated per anesthesia plan  Airway & Oxygen Therapy: Patient remains intubated per anesthesia plan and Patient placed on Ventilator (see vital sign flow sheet for setting)  Post-op Assessment: Report given to RN and Post -op Vital signs reviewed and stable  Post vital signs: Reviewed and stable  Last Vitals:  Vitals Value Taken Time  BP    Temp    Pulse 93 06/10/2022 1744  Resp 18 06/05/2022 1744  SpO2 99 % 05/29/2022 1744  Vitals shown include unvalidated device data.  Last Pain:  Vitals:   06/09/2022 1229  TempSrc: Oral         Complications: No notable events documented.

## 2022-05-22 NOTE — Assessment & Plan Note (Signed)
   Holding tube feeds for now as we await operative intervention  We will resume tube feeds postoperatively with the assistance of nutrition

## 2022-05-22 NOTE — Assessment & Plan Note (Addendum)
·   Please see assessment and plan above °

## 2022-05-22 NOTE — Progress Notes (Signed)
Pt. Switched to heated high flow oxygen per MD. Sherald Hess well at this time.

## 2022-05-22 NOTE — ED Notes (Signed)
Pt transported to MRI with this RN.

## 2022-05-22 NOTE — Consult Note (Incomplete)
Reason for Consult:*** Referring Physician: ***  Sean GIANNELLI is an 81 y.o. male.  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 Radford Pax   CAP (community acquired pneumonia) due to Pneumococcus ([place]) 03/03/2017   Cataract    Chronic combined systolic and diastolic CHF (congestive heart failure) ([place]) 04/28/2018   Chronic kidney disease    Claudication (HCC)    normal ABIs   COPD (chronic obstructive pulmonary disease) ([place])    Depression    Diabetes mellitus without complication ([place])    Dyslipidemia    Hypersomnia    Hypertension    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HCC)    EF 40% with apical AK at cath intolerant to ACE I and ARBS, EF 43% by nuclear stress test 08/2010   Kidney stones    Macular degeneration    Morbid obesity ([place]) 04/28/2018    Past Surgical History:  Procedure Laterality Date   CORONARY ARTERY BYPASS GRAFT     with LIMA to LAD, SVG to diag, SVG to OM, SVG to RCA cath 2002 Dr. Radford Pax   IR [place] SED  05/07/2022   urologic procedure for fertility      Family History  Problem Relation Age of Onset   Hypertension Father    Breast cancer Father    CVA Father    Diabetes Mother    Hypertension Mother    CAD Sister    Hypertension Sister    Diabetes Sister    Heart disease Brother    Diabetes Brother    Breast cancer Sister    Hypertension Sister    Diabetes Sister    Heart disease Sister     Social History:  reports that he quit smoking about 20 years ago. His smoking use included cigarettes. He has never used smokeless tobacco. He reports current alcohol use. He reports that he does not use drugs.  Allergies: No Known Allergies  Medications: {medication reviewed/display:3041432}  Results for orders placed or performed during the hospital  encounter of 06/09/2022 (from the past 48 hour(s))  Comprehensive metabolic panel     Status: Abnormal   Collection Time: 05/29/2022 11:13 PM  Result Value Ref Range   Sodium 136 135 - 145 mmol/L   Potassium 3.9 3.5 - 5.1 mmol/L   Chloride 99 98 - 111 mmol/L   CO2 25 22 - 32 mmol/L   Glucose, Bld 122 (H) 70 - 99 mg/dL    Comment: Glucose reference range applies only to samples taken after fasting for at least 8 hours.   BUN 25 (H) 8 - 23 mg/dL   Creatinine, Ser 0.70 0.61 - 1.24 mg/dL   Calcium 8.5 (L) 8.9 - 10.3 mg/dL   Total Protein 6.0 (L) 6.5 - 8.1 g/dL   Albumin 2.7 (L) 3.5 - 5.0 g/dL   AST 18 15 - 41 U/L   ALT 25 0 - 44 U/L   Alkaline Phosphatase 104 38 - 126 U/L   Total Bilirubin 0.9 0.3 - 1.2 mg/dL   GFR, Estimated >60 >60 mL/min    Comment: (NOTE) Calculated using the CKD-EPI Creatinine Equation (2021)    Anion gap 12 5 - 15    Comment: Performed at [facility], [place] [address], [place], [place]  [address]  Brain natriuretic peptide     Status: Abnormal   Collection Time: 06/09/2022 11:13 PM  Result Value Ref Range   B Natriuretic Peptide 144.4 (H) 0.0 - 100.0 pg/mL    Comment: Performed at [facility] [address], [place], [place] [address]  Troponin I (High Sensitivity)     Status: None   Collection Time: 05/19/2022 11:13 PM  Result Value Ref Range   Troponin I (High Sensitivity) 9 <18 ng/L    Comment: (NOTE) Elevated high sensitivity troponin I (hsTnI) values and significant  changes across serial measurements may suggest ACS but many other  chronic and acute conditions are known to elevate hsTnI results.  Refer to the "Links" section for chest pain algorithms and additional  guidance. Performed at [facility], [place] [address] [place], [place] [address]   CBC with Differential     Status: Abnormal   Collection Time: 05/31/2022 11:13 PM  Result Value Ref Range   WBC 8.7 4.0 - 10.5 K/uL   RBC 3.75 (L) 4.22 - 5.81 MIL/uL   Hemoglobin 10.8 (L) 13.0  - 17.0 g/dL   HCT 34.1 (L) 39.0 - 52.0 %   MCV 90.9 80.0 - 100.0 fL   MCH 28.8 26.0 - 34.0 pg   MCHC 31.7 30.0 - 36.0 g/dL   RDW 13.2 11.5 - 15.5 %   Platelets 204 150 - 400 K/uL   nRBC 0.0 0.0 - 0.2 %   Neutrophils Relative % 73 %   Neutro Abs 6.3 1.7 - 7.7 K/uL   Lymphocytes Relative 16 %   Lymphs Abs 1.4 0.7 - 4.0 K/uL   Monocytes Relative 9 %   Monocytes Absolute 0.8 0.1 - 1.0 K/uL   Eosinophils Relative 1 %   Eosinophils Absolute 0.1 0.0 - 0.5 K/uL   Basophils Relative 0 %   Basophils Absolute 0.0 0.0 - 0.1 K/uL   Immature Granulocytes 1 %   Abs Immature Granulocytes 0.07 0.00 - 0.07 K/uL    Comment: Performed at [facility] [address], [place], [place] [address]  Magnesium     Status: None   Collection Time: 06/01/2022 11:13 PM  Result Value Ref Range   Magnesium 1.7 1.7 - 2.4 mg/dL    Comment: Performed at [facility], [place] [address] [place], [place] [address]  I-Stat venous blood gas, ED     Status: Abnormal   Collection Time: 05/16/2022 11:20 PM  Result Value Ref Range   pH, Ven 7.530 (H) 7.25 - 7.43   pCO2, Ven 32.5 (L) 44 - 60 mmHg   pO2, Ven 103 (H) 32 - 45 mmHg   Bicarbonate 27.1 20.0 - 28.0 mmol/L   TCO2 28 22 - 32 mmol/L   O2 Saturation 99 %   Acid-Base Excess 5.0 (H) 0.0 - 2.0 mmol/L   Sodium 134 (L) 135 - 145 mmol/L   Potassium 3.8 3.5 - 5.1 mmol/L   Calcium, Ion 1.07 (L) 1.15 - 1.40 mmol/L   HCT 32.0 (L) 39.0 - 52.0 %   Hemoglobin 10.9 (L) 13.0 - 17.0 g/dL   Sample type VENOUS   Troponin I (High Sensitivity)     Status: None   Collection Time: 05/29/2022  1:39 AM  Result Value Ref Range   Troponin I (High Sensitivity) 8 <18 ng/L    Comment: (NOTE) Elevated high sensitivity troponin I (hsTnI) values and significant  changes across serial measurements may suggest ACS but many  other  chronic and acute conditions are known to elevate hsTnI results.  Refer to the "Links" section for chest pain algorithms and additional   guidance. Performed at [facility], [place] [address], [place], [place] [address]   Lactic acid, plasma     Status: None   Collection Time: 06/02/2022  2:17 AM  Result Value Ref Range   Lactic Acid, Venous 0.8 0.5 - 1.9 mmol/L    Comment: Performed at [facility] [address], [place], [place] [address]  D-dimer, quantitative     Status: Abnormal   Collection Time: 05/16/2022  3:40 AM  Result Value Ref Range   D-Dimer, Quant 2.40 (H) 0.00 - 0.50 ug/mL-FEU    Comment: (NOTE) At the manufacturer cut-off value of 0.5 g/mL FEU, this assay has a negative predictive value of 95-100%.This assay is intended for use in conjunction with a clinical pretest probability (PTP) assessment model to exclude pulmonary embolism (PE) and deep venous thrombosis (DVT) in outpatients suspected of PE or DVT. Results should be correlated with clinical presentation. Performed at [facility], [place] [address], [place], [place] [address]   Comprehensive metabolic panel     Status: Abnormal   Collection Time: 05/20/2022  4:24 AM  Result Value Ref Range   Sodium 136 135 - 145 mmol/L   Potassium 3.9 3.5 - 5.1 mmol/L   Chloride 103 98 - 111 mmol/L   CO2 25 22 - 32 mmol/L   Glucose, Bld 132 (H) 70 - 99 mg/dL    Comment: Glucose reference range applies only to samples taken after fasting for at least 8 hours.   BUN 23 8 - 23 mg/dL   Creatinine, Ser 0.69 0.61 - 1.24 mg/dL   Calcium 8.3 (L) 8.9 - 10.3 mg/dL   Total Protein 5.7 (L) 6.5 - 8.1 g/dL   Albumin 2.4 (L) 3.5 - 5.0 g/dL   AST 17 15 - 41 U/L   ALT 23 0 - 44 U/L   Alkaline Phosphatase 95 38 - 126 U/L   Total Bilirubin 0.9 0.3 - 1.2 mg/dL   GFR, Estimated >60 >60 mL/min    Comment: (NOTE) Calculated using the CKD-EPI Creatinine Equation (2021)    Anion gap 8 5 - 15    Comment: Performed at [facility], [place] [address], [place], [place] [address]  Magnesium     Status: None   Collection Time: 05/17/2022  4:24 AM  Result Value Ref  Range   Magnesium 1.8 1.7 - 2.4 mg/dL    Comment: Performed at [facility] [address], [place],  [address]  CBC with Differential/Platelet     Status: Abnormal   Collection Time: 05/20/2022  4:24 AM  Result Value Ref Range   WBC 8.0 4.0 - 10.5 K/uL   RBC 3.31 (L) 4.22 - 5.81 MIL/uL   Hemoglobin 9.6 (L) 13.0 - 17.0 g/dL   HCT 30.3 (L) 39.0 - 52.0 %   MCV 91.5 80.0 - 100.0 fL   MCH 29.0 26.0 - 34.0 pg   MCHC 31.7 30.0 - 36.0 g/dL   RDW 13.2 11.5 - 15.5 %   Platelets 196 150 - 400 K/uL   nRBC 0.0 0.0 - 0.2 %   Neutrophils Relative % 79 %   Neutro Abs 6.2 1.7 - 7.7 K/uL   Lymphocytes Relative 10 %   Lymphs Abs 0.8 0.7 - 4.0 K/uL   Monocytes Relative 10 %   Monocytes  Absolute 0.8 0.1 - 1.0 K/uL   Eosinophils Relative 0 %   Eosinophils Absolute 0.0 0.0 - 0.5 K/uL   Basophils Relative 0 %   Basophils Absolute 0.0 0.0 - 0.1 K/uL   Immature Granulocytes 1 %   Abs Immature Granulocytes 0.09 (H) 0.00 - 0.07 K/uL    Comment: Performed at [facility] [address] [place],  [address]  CBG monitoring, ED     Status: Abnormal   Collection Time: 06/08/2022  5:35 AM  Result Value Ref Range   Glucose-Capillary 131 (H) 70 - 99 mg/dL    Comment: Glucose reference range applies only to samples taken after fasting for at least 8 hours.    CT Head Wo Contrast  Result Date: 05/27/2022 CLINICAL DATA:  Neck pain with bilateral upper and lower extremity weakness EXAM: CT HEAD WITHOUT CONTRAST CT CERVICAL SPINE WITHOUT CONTRAST TECHNIQUE: Multidetector CT imaging of the head and cervical spine was performed following the standard protocol without intravenous contrast. Multiplanar CT image reconstructions of the cervical spine were also generated. RADIATION DOSE REDUCTION: This exam was performed according to the departmental dose-optimization program which includes automated exposure control, adjustment of the mA and/or kV according to patient size and/or use of iterative  reconstruction technique. COMPARISON:  CTA head neck 04/28/2022 FINDINGS: CT HEAD FINDINGS Brain: There is no mass, hemorrhage or extra-axial collection. The size and configuration of the ventricles and extra-axial CSF spaces are normal. There is hypoattenuation of the periventricular white matter, most commonly indicating chronic ischemic microangiopathy. Vascular: No abnormal hyperdensity of the major intracranial arteries or dural venous sinuses. No intracranial atherosclerosis. Skull: The visualized skull base, calvarium and extracranial soft tissues are normal. Sinuses/Orbits: No fluid levels or advanced mucosal thickening of the visualized paranasal sinuses. No mastoid or middle ear effusion. The orbits are normal. CT CERVICAL SPINE FINDINGS Alignment: No static subluxation. Facets are aligned. Occipital condyles are normally positioned. Skull base and vertebrae: Previously seen gas dorsal to the C4-5 disc spaces resolved. There is progressive endplate erosion of the opposing C4-5 endplates. Soft tissues and spinal canal: There is prevertebral soft tissue swelling extending from C1-C5. Disc levels: As above, there are endplate irregularities at C4-5. Upper chest: No pneumothorax, pulmonary nodule or pleural effusion. Other: Normal visualized paraspinal cervical soft tissues. IMPRESSION: 1. No acute intracranial abnormality. 2. Progressive endplate erosion of the opposing C4-5 endplates with prevertebral soft tissue swelling extending from C1-C5. 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 Given the upper and lower extremity weakness, an MRI of the cervical spine with and without contrast should be obtained to assess for a possible epidural abscess causing cord compression. 3. Prevertebral soft tissue swelling at the C2-5 levels is likely reactive. No discrete retropharyngeal abscess is visible. Electronically Signed   By: Ulyses Jarred M.D.   On: 06/03/2022 01:25   CT Cervical Spine Wo Contrast  Result  Date: 06/14/2022 CLINICAL DATA:  Neck pain with bilateral upper and lower extremity weakness EXAM: CT HEAD WITHOUT CONTRAST CT CERVICAL SPINE WITHOUT CONTRAST TECHNIQUE: Multidetector CT imaging of the head and cervical spine was performed following the standard protocol without intravenous contrast. Multiplanar CT image reconstructions of the cervical spine were also generated. RADIATION DOSE REDUCTION: This exam was performed according to the departmental dose-optimization program which includes automated exposure control, adjustment of the mA and/or kV according to patient size and/or use of iterative reconstruction technique. COMPARISON:  CTA head neck 04/28/2022 FINDINGS: CT HEAD FINDINGS Brain: There is no mass, hemorrhage or  extra-axial collection. The size and configuration of the ventricles and extra-axial CSF spaces are normal. There is hypoattenuation of the periventricular white matter, most commonly indicating chronic ischemic microangiopathy. Vascular: No abnormal hyperdensity of the major intracranial arteries or dural venous sinuses. No intracranial atherosclerosis. Skull: The visualized skull base, calvarium and extracranial soft tissues are normal. Sinuses/Orbits: No fluid levels or advanced mucosal thickening of the visualized paranasal sinuses. No mastoid or middle ear effusion. The orbits are normal. CT CERVICAL SPINE FINDINGS Alignment: No static subluxation. Facets are aligned. Occipital condyles are normally positioned. Skull base and vertebrae: Previously seen gas dorsal to the C4-5 disc spaces resolved. There is progressive endplate erosion of the opposing C4-5 endplates. Soft tissues and spinal canal: There is prevertebral soft tissue swelling extending from C1-C5. Disc levels: As above, there are endplate irregularities at C4-5. Upper chest: No pneumothorax, pulmonary nodule or pleural effusion. Other: Normal visualized paraspinal cervical soft tissues. IMPRESSION: 1. No acute intracranial  abnormality. 2. Progressive endplate erosion of the opposing C4-5 endplates with prevertebral soft tissue swelling extending from C1-C5. 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 Given the upper and lower extremity weakness, an MRI of the cervical spine with and without contrast should be obtained to assess for a possible epidural abscess causing cord compression. 3. Prevertebral soft tissue swelling at the C2-5 levels is likely reactive. No discrete retropharyngeal abscess is visible. Electronically Signed   By: Ulyses Jarred M.D.   On: 06/11/2022 01:25   MR Cervical Spine W or Wo Contrast  Result Date: 05/18/2022 CLINICAL DATA:  Quadriparesis EXAM: MRI CERVICAL SPINE WITHOUT AND WITH CONTRAST TECHNIQUE: Multiplanar and multiecho pulse sequences of the cervical spine, to include the craniocervical junction and cervicothoracic junction, were obtained without and with intravenous contrast. CONTRAST:  18m GADAVIST GADOBUTROL 1 MMOL/ML IV SOLN COMPARISON:  CT cervical spine 06/13/2022 FINDINGS: Alignment: Normal Vertebrae: There is abnormal T1 and T2-weighted signal within the C4 and C5 vertebral bodies with endplate abnormalities characterized on the earlier CT, consistent with discitis-osteomyelitis. Cord: There is hyperintense T2-weighted signal throughout much of the cervical spinal cord. Spinal cord is compressed at the C3-5 levels by a large ventral epidural abscess that measures 3.4 cm in craniocaudal dimension and 0.7 cm in thickness. Posterior Fossa, vertebral arteries, paraspinal tissues: There is a prevertebral phlegmon/abscess measuring up to 13 mm in thickness. Disc levels: Aside from the above described, the disc levels are unremarkable. IMPRESSION: 1. 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 2. Prevertebral phlegmon/abscess measuring up to 13 mm in thickness. Critical Value/emergent results were called by telephone at the time  of interpretation on 06/01/2022 at 3:49 am to provider EQuintella Reichert who verbally acknowledged these results. Electronically Signed   By: KUlyses JarredM.D.   On: 05/29/2022 03:50   DG Chest Portable 1 View  Result Date: 06/12/2022 CLINICAL DATA:  Dyspnea. EXAM: PORTABLE CHEST 1 VIEW COMPARISON:  Radiograph 04/29/2022, CT 04/27/2022 FINDINGS: Prior median sternotomy and CABG. Lung volumes are low. Heart size difficult to assess but upper normal in size. Aortic atherosclerosis, stable mediastinal contours. Increased interstitial markings are again seen, slight improvement on the left from prior. There may be a small right pleural effusion. No confluent consolidation or pneumothorax. IMPRESSION: 1. Low lung volumes. Increased interstitial opacities with slight improvement on the left from prior exam, may be chronic or represent pulmonary edema. 2. Possible small right pleural effusion. Electronically Signed   By: MKeith RakeM.D.   On:  05/20/2022 23:04    Review of Systems Blood pressure 112/61, pulse (!) 104, temperature 99 F (37.2 C), temperature source Rectal, resp. rate (!) 29, SpO2 95 %. Physical Exam  Assessment/Plan: ***  Marvis Moeller, DNP, AGNP-C Neurosurgery Nurse Practitioner  [facility] [address], [place], [place] [address] P: [phone]    F: [phone]  05/25/2022 7:22 AM

## 2022-05-23 ENCOUNTER — Encounter (HOSPITAL_COMMUNITY): Payer: Self-pay | Admitting: Neurosurgery

## 2022-05-23 ENCOUNTER — Inpatient Hospital Stay (HOSPITAL_COMMUNITY): Payer: Medicare Other

## 2022-05-23 DIAGNOSIS — E43 Unspecified severe protein-calorie malnutrition: Secondary | ICD-10-CM | POA: Insufficient documentation

## 2022-05-23 DIAGNOSIS — G061 Intraspinal abscess and granuloma: Secondary | ICD-10-CM

## 2022-05-23 LAB — MAGNESIUM
Magnesium: 2.2 mg/dL (ref 1.7–2.4)
Magnesium: 1.9 mg/dL (ref 1.7–2.4)

## 2022-05-23 LAB — BASIC METABOLIC PANEL
Anion gap: 10 (ref 5–15)
BUN: 15 mg/dL (ref 8–23)
CO2: 25 mmol/L (ref 22–32)
Calcium: 8.2 mg/dL — ABNORMAL LOW (ref 8.9–10.3)
Chloride: 103 mmol/L (ref 98–111)
Creatinine, Ser: 0.63 mg/dL (ref 0.61–1.24)
GFR, Estimated: >60 mL/min (ref >60)
Glucose, Bld: 127 mg/dL — ABNORMAL HIGH (ref 70–99)
Potassium: 4.2 mmol/L (ref 3.5–5.1)
Sodium: 138 mmol/L (ref 135–145)

## 2022-05-23 LAB — POCT I-STAT 7, (LYTES, BLD GAS, ICA,H+H)
Acid-Base Excess: 2 mmol/L (ref 0.0–2.0)
Bicarbonate: 26.8 mmol/L (ref 20.0–28.0)
Calcium, Ion: 1.19 mmol/L (ref 1.15–1.40)
HCT: 27 % — ABNORMAL LOW (ref 39.0–52.0)
Hemoglobin: 9.2 g/dL — ABNORMAL LOW (ref 13.0–17.0)
O2 Saturation: 99 %
Patient temperature: 97.3
Potassium: 4.3 mmol/L (ref 3.5–5.1)
Sodium: 137 mmol/L (ref 135–145)
TCO2: 28 mmol/L (ref 22–32)
pCO2 arterial: 41.8 mmHg (ref 32–48)
pH, Arterial: 7.412 (ref 7.35–7.45)
pO2, Arterial: 147 mmHg — ABNORMAL HIGH (ref 83–108)

## 2022-05-23 LAB — CBC
HCT: 28.4 % — ABNORMAL LOW (ref 39.0–52.0)
Hemoglobin: 9.1 g/dL — ABNORMAL LOW (ref 13.0–17.0)
MCH: 29.2 pg (ref 26.0–34.0)
MCHC: 32 g/dL (ref 30.0–36.0)
MCV: 91 fL (ref 80.0–100.0)
Platelets: 181 10*3/uL (ref 150–400)
RBC: 3.12 MIL/uL — ABNORMAL LOW (ref 4.22–5.81)
RDW: 12.9 % (ref 11.5–15.5)
WBC: 8.1 10*3/uL (ref 4.0–10.5)
nRBC: 0 % (ref 0.0–0.2)

## 2022-05-23 LAB — GLUCOSE, CAPILLARY
Glucose-Capillary: 131 mg/dL — ABNORMAL HIGH (ref 70–99)
Glucose-Capillary: 102 mg/dL — ABNORMAL HIGH (ref 70–99)
Glucose-Capillary: 114 mg/dL — ABNORMAL HIGH (ref 70–99)
Glucose-Capillary: 115 mg/dL — ABNORMAL HIGH (ref 70–99)
Glucose-Capillary: 120 mg/dL — ABNORMAL HIGH (ref 70–99)
Glucose-Capillary: 125 mg/dL — ABNORMAL HIGH (ref 70–99)

## 2022-05-23 LAB — PHOSPHORUS
Phosphorus: 3.1 mg/dL (ref 2.5–4.6)
Phosphorus: 2 mg/dL — ABNORMAL LOW (ref 2.5–4.6)

## 2022-05-23 LAB — TRIGLYCERIDES: Triglycerides: 51 mg/dL (ref <150)

## 2022-05-23 MED ORDER — ORAL CARE MOUTH RINSE
15.0000 mL | Freq: Two times a day (BID) | OROMUCOSAL | Status: DC
  Administered 2022-05-23 – 2022-05-24 (×3): 15 mL via OROMUCOSAL

## 2022-05-23 MED ORDER — PROSOURCE TF PO LIQD
45.0000 mL | Freq: Every day | ORAL | Status: DC
  Administered 2022-05-23 – 2022-05-24 (×2): 45 mL
  Filled 2022-05-23 (×2): qty 45

## 2022-05-23 MED ORDER — OXYCODONE HCL 5 MG/5ML PO SOLN
10.0000 mg | ORAL | Status: DC | PRN
  Administered 2022-05-23 (×2): 10 mg
  Filled 2022-05-23 (×2): qty 10

## 2022-05-23 MED ORDER — OXYCODONE HCL 5 MG/5ML PO SOLN: 5.0000 mg | ORAL | Status: DC | PRN

## 2022-05-23 MED ORDER — CHLORHEXIDINE GLUCONATE 0.12 % MT SOLN
15.0000 mL | Freq: Two times a day (BID) | OROMUCOSAL | Status: DC
  Administered 2022-05-23 – 2022-05-24 (×3): 15 mL via OROMUCOSAL
  Filled 2022-05-23 (×2): qty 15

## 2022-05-23 MED ORDER — GLUCERNA 1.5 CAL PO LIQD
1000.0000 mL | ORAL | Status: DC
  Administered 2022-05-23 – 2022-05-24 (×2): 1000 mL
  Filled 2022-05-23 (×3): qty 1000

## 2022-05-23 MED ORDER — IPRATROPIUM-ALBUTEROL 0.5-2.5 (3) MG/3ML IN SOLN: 3.0000 mL | Freq: Four times a day (QID) | RESPIRATORY_TRACT | Status: DC

## 2022-05-23 MED ORDER — ASPIRIN 81 MG PO CHEW
81.0000 mg | CHEWABLE_TABLET | Freq: Every day | ORAL | Status: DC
  Administered 2022-05-23 – 2022-05-24 (×2): 81 mg
  Filled 2022-05-23 (×2): qty 1

## 2022-05-23 MED FILL — Thrombin For Soln 5000 Unit: CUTANEOUS | Qty: 5000 | Status: AC

## 2022-05-23 NOTE — Assessment & Plan Note (Addendum)
Rhonchi on examination.  - Continue Duonebs for now. - Transition to ICS, LABA, LAMA

## 2022-05-23 NOTE — Progress Notes (Signed)
Patient achieved -10 on NIF. Patient stated he is weak and that was the best effort after 3 attempts.

## 2022-05-23 NOTE — Progress Notes (Signed)
Subjective: Patient remains intubated. He is nodding appropriately. Reports malaise. Paresthesia appears to have slightly improved. No acute events overnight.   Objective: Vital signs in last 24 hours: Temp:  [97.3 F (36.3 C)-98.3 F (36.8 C)] 98.3 F (36.8 C) (06/08 0400) Pulse Rate:  [68-110] 68 (06/08 0725) Resp:  [15-29] 18 (06/08 0725) BP: (87-127)/(52-68) 116/58 (06/08 0725) SpO2:  [89 %-100 %] 98 % (06/08 0725) FiO2 (%):  [15 %-100 %] 40 % (06/08 0736) Weight:  [88.4 kg] 88.4 kg (06/07 1229)  Intake/Output from previous day: 06/07 0701 - 06/08 0700 In: 2275.3 [I.V.:1175.4; IV Piggyback:1099.9] Out: 925 [Urine:850; Blood:75] Intake/Output this shift: Total I/O In: -  Out: 550 [Urine:550]  Physical Exam: Patient is awake, alert, and intubated. Eyes open spontaneously. He is able to follow all commands. BUE 2/5, BLE 1/5. Eyes open spontaneously. Sensation to light touch is impaired but has slightly improved as compared to yesterday. PERLA, EOMI. CNs grossly intact. Dressing is clean dry intact. Incision is well approximated with no drainage, erythema, or edema. Hemovac with approximately 75 ml of sanguineous  drainage. Hard cervical collar in place   Lab Results: Recent Labs    06/10/2022 1927 05/23/22 0340 05/23/22 0508  WBC 8.7  --  8.1  HGB 9.2* 9.2* 9.1*  HCT 28.9* 27.0* 28.4*  PLT 195  --  181   BMET Recent Labs    06/08/2022 0424 05/23/22 0340 05/23/22 0508  NA 136 137 138  K 3.9 4.3 4.2  CL 103  --  103  CO2 25  --  25  GLUCOSE 132*  --  127*  BUN 23  --  15  CREATININE 0.69  --  0.63  CALCIUM 8.3*  --  8.2*    Studies/Results: Portable Chest xray  Result Date: 05/23/2022 CLINICAL DATA:  Intubated EXAM: PORTABLE CHEST 1 VIEW COMPARISON:  06/07/2022 FINDINGS: Endotracheal tube 3.3 cm above the carina. NG tube extends below the hemidiaphragms into the stomach with the tip not visualized. Similar low lung volumes with vascular congestion and basilar  atelectasis/consolidation. Difficult to exclude basilar pneumonia. No enlarging effusion or pneumothorax. Stable mild cardiomegaly. Previous coronary bypass changes noted. Aorta atherosclerotic. IMPRESSION: Support apparatus in good position. Similar low lung volumes with bibasilar atelectasis/consolidation. Electronically Signed   By: Jerilynn Mages.  Shick M.D.   On: 05/23/2022 08:16   DG Abd 1 View  Result Date: 05/29/2022 CLINICAL DATA:  Check gastric catheter placement EXAM: ABDOMEN - 1 VIEW COMPARISON:  None Available. FINDINGS: Gastric catheter is noted within the stomach. Gastrostomy catheter is noted in place as well. No obstructive changes or free air are seen. IMPRESSION: Gastric catheter within the stomach. Electronically Signed   By: Inez Catalina M.D.   On: 05/28/2022 21:25   DG Cervical Spine 2 or 3 views  Result Date: 06/13/2022 CLINICAL DATA:  Fluoroscopic assistance for cervical disc fusion EXAM: CERVICAL SPINE - 2-3 VIEW COMPARISON:  MR cervical spine done on FINDINGS: Fluoroscopic images show interval anterior surgical fusion from C3-C6 levels. Fluoroscopic time was 20 seconds. Radiation dose 2.42 mGy. IMPRESSION: Fluoroscopic assistance was provided for anterior surgical fusion from C3-C6 levels. Electronically Signed   By: Elmer Picker M.D.   On: 05/18/2022 17:35   DG C-Arm 1-60 Min-No Report  Result Date: 05/26/2022 Fluoroscopy was utilized by the requesting physician.  No radiographic interpretation.   DG C-Arm 1-60 Min-No Report  Result Date: 05/27/2022 Fluoroscopy was utilized by the requesting physician.  No radiographic interpretation.   DG C-Arm  1-60 Min-No Report  Result Date: 06/12/2022 Fluoroscopy was utilized by the requesting physician.  No radiographic interpretation.   MR Cervical Spine W or Wo Contrast  Result Date: 05/17/2022 CLINICAL DATA:  Quadriparesis EXAM: MRI CERVICAL SPINE WITHOUT AND WITH CONTRAST TECHNIQUE: Multiplanar and multiecho pulse sequences of the  cervical spine, to include the craniocervical junction and cervicothoracic junction, were obtained without and with intravenous contrast. CONTRAST:  28m GADAVIST GADOBUTROL 1 MMOL/ML IV SOLN COMPARISON:  CT cervical spine 06/14/2022 FINDINGS: Alignment: Normal Vertebrae: There is abnormal T1 and T2-weighted signal within the C4 and C5 vertebral bodies with endplate abnormalities characterized on the earlier CT, consistent with discitis-osteomyelitis. Cord: There is hyperintense T2-weighted signal throughout much of the cervical spinal cord. Spinal cord is compressed at the C3-5 levels by a large ventral epidural abscess that measures 3.4 cm in craniocaudal dimension and 0.7 cm in thickness. Posterior Fossa, vertebral arteries, paraspinal tissues: There is a prevertebral phlegmon/abscess measuring up to 13 mm in thickness. Disc levels: Aside from the above described, the disc levels are unremarkable. IMPRESSION: 1. 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 2. Prevertebral phlegmon/abscess measuring up to 13 mm in thickness. Critical Value/emergent results were called by telephone at the time of interpretation on 05/23/2022 at 3:49 am to provider EQuintella Reichert who verbally acknowledged these results. Electronically Signed   By: KUlyses JarredM.D.   On: 06/10/2022 03:50   CT Head Wo Contrast  Result Date: 06/07/2022 CLINICAL DATA:  Neck pain with bilateral upper and lower extremity weakness EXAM: CT HEAD WITHOUT CONTRAST CT CERVICAL SPINE WITHOUT CONTRAST TECHNIQUE: Multidetector CT imaging of the head and cervical spine was performed following the standard protocol without intravenous contrast. Multiplanar CT image reconstructions of the cervical spine were also generated. RADIATION DOSE REDUCTION: This exam was performed according to the departmental dose-optimization program which includes automated exposure control, adjustment of the mA and/or  kV according to patient size and/or use of iterative reconstruction technique. COMPARISON:  CTA head neck 04/28/2022 FINDINGS: CT HEAD FINDINGS Brain: There is no mass, hemorrhage or extra-axial collection. The size and configuration of the ventricles and extra-axial CSF spaces are normal. There is hypoattenuation of the periventricular white matter, most commonly indicating chronic ischemic microangiopathy. Vascular: No abnormal hyperdensity of the major intracranial arteries or dural venous sinuses. No intracranial atherosclerosis. Skull: The visualized skull base, calvarium and extracranial soft tissues are normal. Sinuses/Orbits: No fluid levels or advanced mucosal thickening of the visualized paranasal sinuses. No mastoid or middle ear effusion. The orbits are normal. CT CERVICAL SPINE FINDINGS Alignment: No static subluxation. Facets are aligned. Occipital condyles are normally positioned. Skull base and vertebrae: Previously seen gas dorsal to the C4-5 disc spaces resolved. There is progressive endplate erosion of the opposing C4-5 endplates. Soft tissues and spinal canal: There is prevertebral soft tissue swelling extending from C1-C5. Disc levels: As above, there are endplate irregularities at C4-5. Upper chest: No pneumothorax, pulmonary nodule or pleural effusion. Other: Normal visualized paraspinal cervical soft tissues. IMPRESSION: 1. No acute intracranial abnormality. 2. Progressive endplate erosion of the opposing C4-5 endplates with prevertebral soft tissue swelling extending from C1-C5. 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 Given the upper and lower extremity weakness, an MRI of the cervical spine with and without contrast should be obtained to assess for a possible epidural abscess causing cord compression. 3. Prevertebral soft tissue swelling at the C2-5 levels is likely reactive. No discrete retropharyngeal abscess is visible. Electronically Signed  By: Ulyses Jarred M.D.   On:  05/23/2022 01:25   CT Cervical Spine Wo Contrast  Result Date: 05/29/2022 CLINICAL DATA:  Neck pain with bilateral upper and lower extremity weakness EXAM: CT HEAD WITHOUT CONTRAST CT CERVICAL SPINE WITHOUT CONTRAST TECHNIQUE: Multidetector CT imaging of the head and cervical spine was performed following the standard protocol without intravenous contrast. Multiplanar CT image reconstructions of the cervical spine were also generated. RADIATION DOSE REDUCTION: This exam was performed according to the departmental dose-optimization program which includes automated exposure control, adjustment of the mA and/or kV according to patient size and/or use of iterative reconstruction technique. COMPARISON:  CTA head neck 04/28/2022 FINDINGS: CT HEAD FINDINGS Brain: There is no mass, hemorrhage or extra-axial collection. The size and configuration of the ventricles and extra-axial CSF spaces are normal. There is hypoattenuation of the periventricular white matter, most commonly indicating chronic ischemic microangiopathy. Vascular: No abnormal hyperdensity of the major intracranial arteries or dural venous sinuses. No intracranial atherosclerosis. Skull: The visualized skull base, calvarium and extracranial soft tissues are normal. Sinuses/Orbits: No fluid levels or advanced mucosal thickening of the visualized paranasal sinuses. No mastoid or middle ear effusion. The orbits are normal. CT CERVICAL SPINE FINDINGS Alignment: No static subluxation. Facets are aligned. Occipital condyles are normally positioned. Skull base and vertebrae: Previously seen gas dorsal to the C4-5 disc spaces resolved. There is progressive endplate erosion of the opposing C4-5 endplates. Soft tissues and spinal canal: There is prevertebral soft tissue swelling extending from C1-C5. Disc levels: As above, there are endplate irregularities at C4-5. Upper chest: No pneumothorax, pulmonary nodule or pleural effusion. Other: Normal visualized  paraspinal cervical soft tissues. IMPRESSION: 1. No acute intracranial abnormality. 2. Progressive endplate erosion of the opposing C4-5 endplates with prevertebral soft tissue swelling extending from C1-C5. 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 Given the upper and lower extremity weakness, an MRI of the cervical spine with and without contrast should be obtained to assess for a possible epidural abscess causing cord compression. 3. Prevertebral soft tissue swelling at the C2-5 levels is likely reactive. No discrete retropharyngeal abscess is visible. Electronically Signed   By: Ulyses Jarred M.D.   On: 05/28/2022 01:25   DG Chest Portable 1 View  Result Date: 05/27/2022 CLINICAL DATA:  Dyspnea. EXAM: PORTABLE CHEST 1 VIEW COMPARISON:  Radiograph 04/29/2022, CT 04/27/2022 FINDINGS: Prior median sternotomy and CABG. Lung volumes are low. Heart size difficult to assess but upper normal in size. Aortic atherosclerosis, stable mediastinal contours. Increased interstitial markings are again seen, slight improvement on the left from prior. There may be a small right pleural effusion. No confluent consolidation or pneumothorax. IMPRESSION: 1. Low lung volumes. Increased interstitial opacities with slight improvement on the left from prior exam, may be chronic or represent pulmonary edema. 2. Possible small right pleural effusion. Electronically Signed   By: Keith Rake M.D.   On: 05/26/2022 23:04    Assessment/Plan: Patient is post-op day 1 s/p C4 corpectomy with anterior cervical plate C3-C5 due to cervical discitis/osteomyelitis with epidural abscess. He remained intubated overnight with plans for extubation today. 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 Sensation has slightly improved. He appears to nod appropriately to questions. Continue supportive care.    -CCM medical management, appreciate assistance  -Plan for possible extubation today -Continue  Hemovac -Continue hard cervical collar -PT/OT when appropriate -ID consult   LOS: 1 day    Marvis Moeller, DNP, AGNP-C Neurosurgery Nurse Practitioner  [facility] [phone]  [address], [place], [place] [address] P: [phone]    F: [phone]  05/23/2022 8:25 AM

## 2022-05-23 NOTE — Assessment & Plan Note (Signed)
Currently euglycemic  - SSI only

## 2022-05-23 NOTE — Assessment & Plan Note (Signed)
Status post C4 corpectomy and J5-0 plate,  Some flicker movement in extremities C-collar in place.  Minimal JP drainage  - Continue wound care per neurosurgery.

## 2022-05-23 NOTE — Assessment & Plan Note (Signed)
ID is following   - Continue current antibiotics

## 2022-05-23 NOTE — Procedures (Signed)
Extubation Procedure Note  Patient Details:   Name: [name] DOB: Jan 28, 1941 MRN: [id number]   Airway Documentation:    Vent end date: 05/23/22 Vent end time: 1058   Evaluation  O2 sats: stable throughout Complications: No apparent complications Patient did tolerate procedure well. Bilateral Breath Sounds: Diminished, Clear   Yes,   Per MD order, pt was extubated to 3L [place]. Prior to extubation pt did have positive cuff leak. Pt tolerated well and was able to follow commands. Due to vocal cord paralysis pt is unable to speak loudly. No stridor noted, SVS. RT will continue to monitor pt.   [name] 05/23/2022, 11:06 AM

## 2022-05-23 NOTE — Progress Notes (Signed)
Initial Nutrition Assessment  DOCUMENTATION CODES:   Severe malnutrition in context of acute illness/injury  INTERVENTION:   Initiate tube feeding via PEG: Glucerna 1.5 at 55 ml/h (1320 ml per day) Prosource TF 45 ml daily  Provides 2020 kcal, 120 gm protein, 1003 ml free water daily   NUTRITION DIAGNOSIS:   Severe Malnutrition related to acute illness (CVA) as evidenced by severe muscle depletion, severe fat depletion.  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 Pt now admitted with new quadriplegia due to C4-C5 discitis/osteomyelitis/epidural abscess with spinal compression s/p C4 corpectomy.   Pt discussed during ICU rounds and with RN and MD.  Damaris Schooner with pt who is hard of hearing and wife who is at bedside. Pt having muscle spasms during visit.  Wife reports that he had been receiving TF via PEG at rehab and he had been walking with a walker 5 days a week with PT. The day of admission pt was to have had a swallow eval but this was deferred. Pt has a hoarse whisper of a voice.  Weight loss history Usual body weight 258 lb Pt began to lose weight in August of 2022 due to teeth removal. He was down to 229 lb at admission for his stroke 04/2022. This admission he is now 194 lb.  Wife confirms weight loss.  No edema charted this admission.     Medications reviewed and include: colace, SSI, protonix, miralax  Labs reviewed:  A1C: 6.5 CBG's: 114-137  69F PEG   NUTRITION - FOCUSED PHYSICAL EXAM:  Flowsheet Row Most Recent Value  Orbital Region Severe depletion  Upper  Arm Region No depletion  Thoracic and Lumbar Region No depletion  Buccal Region Severe depletion  Temple Region Severe depletion  Clavicle Bone Region Severe depletion  Clavicle and Acromion Bone Region Severe depletion  Scapular Bone Region Severe depletion  Dorsal Hand Moderate depletion  Patellar Region Severe depletion  Anterior Thigh Region Severe depletion  Posterior Calf Region Severe depletion  Edema (RD Assessment) None  Hair Reviewed  Eyes Reviewed  Mouth Reviewed  Skin Reviewed  [ecchymosis]  Nails Reviewed       Diet Order:   Diet Order             Diet NPO time specified  Diet effective now                   EDUCATION NEEDS:   Education needs have been addressed  Skin:  Skin Assessment: Reviewed RN Assessment  Last BM:  unknown  Height:   Ht Readings from Last 1 Encounters:  05/30/2022 '5\' 6"'$  (1.676 m)    Weight:   Wt Readings from Last 1 Encounters:  05/28/2022 88.4 kg    BMI:  Body mass index is 31.46 kg/m.  Estimated Nutritional Needs:   Kcal:  1800-2000  Protein:  110-125 grams  Fluid:  >1.8 L/day  Lockie Pares., RD, LDN, CNSC See [facility] for contact information

## 2022-05-23 NOTE — Progress Notes (Signed)
NIF= -15

## 2022-05-23 NOTE — Assessment & Plan Note (Signed)
Blood pressure currently well controlled.   - Sequential re-introduction of home medications.  - Remove arterial line

## 2022-05-23 NOTE — Care Management (Signed)
[name] is a 81 y.o. 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 s/p C4 corpectomy with placement of IV expandable corpectomy cage and anterior cervical plating from C3-5 for stabilization.  Spoke with patient's wife, Hoyle Sauer, per her request: She has questions regarding obtaining power of attorney for patient, as advised by the [place].  Wife made aware that we can assist with advanced directives or living wills, and provide notaries for this through the chaplain services, but we do not provide notaries for durable POAs.   Answered all of wife's questions to the best of my ability.  She appreciated my call.  Reinaldo Raddle, RN, BSN  Trauma/Neuro ICU Case Manager [phone]

## 2022-05-23 NOTE — Assessment & Plan Note (Addendum)
Tolerated SBT yesterday. Complains of increasing difficulty breathing. Remains in c-collar. Low NIF at -10 Underlying advanced COPD.  -Worsening respiratory failure.  Patient has requested not to be reintubated.  Wife is in agreement. -Transition to comfort care as dyspnea increases.

## 2022-05-23 NOTE — Progress Notes (Signed)
[facility] for Infectious Disease    Date of Admission:  05/30/2022   Total days of antibiotics 3/vanco & ceftriaxone          ID: [name] is a 81 y.o. 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 s/p C4  corpectomy with palcement of IV expandable corpectomy cage and anterior cervical plating from C3-5 for stabilization by Dr Marcello Moores Principal Problem:   Abscess in epidural space of cervical spine Active Problems:   Essential hypertension   COPD (chronic obstructive pulmonary disease) (HCC)   Acute on chronic respiratory failure with hypoxia (HCC)   Type 2 diabetes mellitus without complication, without long-term current use of insulin (HCC)   Acute osteomyelitis of cervical spine (HCC)   Cervical discitis   Urinary retention   Protein-calorie malnutrition, severe    Subjective: Patient still having some spasm with associated pain. He is able to speak softly per his wife. Not having significant strength to arms bilaterally, unable to do hand grip  Medications:   aspirin  81 mg Per Tube Daily   atorvastatin  40 mg Per Tube Daily   chlorhexidine  15 mL Mouth Rinse BID   Chlorhexidine Gluconate Cloth  6 each Topical Daily   docusate  100 mg Per Tube BID   dorzolamide  1 drop Both Eyes BID   enoxaparin (LOVENOX) injection  40 mg Subcutaneous Q24H   feeding supplement (PROSource TF)  45 mL Per Tube Daily   insulin aspart  0-15 Units Subcutaneous Q4H   mouth rinse  15 mL Mouth Rinse q12n4p   metoprolol tartrate  12.5 mg Per Tube BID   pantoprazole sodium  40 mg Per Tube Daily   Or   pantoprazole (PROTONIX) IV  40 mg Intravenous Daily   polyethylene glycol  17 g Per Tube Daily   sodium chloride flush  3 mL Intravenous Q12H    Objective: Vital signs in last 24 hours: Temp:  [97.3 F (36.3 C)-98.5 F (36.9 C)] 98.5 F (36.9 C) (06/08 1559) Pulse Rate:  [68-97] 90 (06/08 1400) Resp:  [17-25] 23 (06/08 1400) BP:  (107-127)/(54-88) 107/54 (06/08 1400) SpO2:  [89 %-100 %] 91 % (06/08 1400) FiO2 (%):  [40 %-100 %] 40 % (06/08 0906) Gen = a xo by 3  Neck = drain with serosanginous fluid, anterior incision, wearing aspen collar Pulm = CTAB no w/c/r Cors= nl s1,s2 no g/m/r Neuro = soft voice, no expressive aphasia. Sensation intact to upper extremities but unable to make fists bilaterally on exam.  Lab Results Recent Labs    05/30/2022 0424 06/02/2022 1927 05/23/22 0340 05/23/22 0508  WBC 8.0 8.7  --  8.1  HGB 9.6* 9.2* 9.2* 9.1*  HCT 30.3* 28.9* 27.0* 28.4*  NA 136  --  137 138  K 3.9  --  4.3 4.2  CL 103  --   --  103  CO2 25  --   --  25  BUN 23  --   --  15  CREATININE 0.69  --   --  0.63   Liver Panel Recent Labs    05/21/2022 2313 05/23/2022 0424  PROT 6.0* 5.7*  ALBUMIN 2.7* 2.4*  AST 18 17  ALT 25 23  ALKPHOS 104 95  BILITOT 0.9 0.9   Sedimentation Rate No results for input(s): "ESRSEDRATE" in the last 72 hours. C-Reactive Protein No results for input(s): "CRP" in the last 72 hours.  Microbiology: OR culture -  showing GNR and GPCprs Studies/Results: Portable Chest xray  Result Date: 05/23/2022 CLINICAL DATA:  Intubated EXAM: PORTABLE CHEST 1 VIEW COMPARISON:  05/16/2022 FINDINGS: Endotracheal tube 3.3 cm above the carina. NG tube extends below the hemidiaphragms into the stomach with the tip not visualized. Similar low lung volumes with vascular congestion and basilar atelectasis/consolidation. Difficult to exclude basilar pneumonia. No enlarging effusion or pneumothorax. Stable mild cardiomegaly. Previous coronary bypass changes noted. Aorta atherosclerotic. IMPRESSION: Support apparatus in good position. Similar low lung volumes with bibasilar atelectasis/consolidation. Electronically Signed   By: Jerilynn Mages.  Shick M.D.   On: 05/23/2022 08:16   DG Abd 1 View  Result Date: 06/13/2022 CLINICAL DATA:  Check gastric catheter placement EXAM: ABDOMEN - 1 VIEW COMPARISON:  None Available.  FINDINGS: Gastric catheter is noted within the stomach. Gastrostomy catheter is noted in place as well. No obstructive changes or free air are seen. IMPRESSION: Gastric catheter within the stomach. Electronically Signed   By: Inez Catalina M.D.   On: 06/09/2022 21:25   DG Cervical Spine 2 or 3 views  Result Date: 06/10/2022 CLINICAL DATA:  Fluoroscopic assistance for cervical disc fusion EXAM: CERVICAL SPINE - 2-3 VIEW COMPARISON:  MR cervical spine done on FINDINGS: Fluoroscopic images show interval anterior surgical fusion from C3-C6 levels. Fluoroscopic time was 20 seconds. Radiation dose 2.42 mGy. IMPRESSION: Fluoroscopic assistance was provided for anterior surgical fusion from C3-C6 levels. Electronically Signed   By: Elmer Picker M.D.   On: 06/02/2022 17:35   DG C-Arm 1-60 Min-No Report  Result Date: 06/01/2022 Fluoroscopy was utilized by the requesting physician.  No radiographic interpretation.   DG C-Arm 1-60 Min-No Report  Result Date: 05/25/2022 Fluoroscopy was utilized by the requesting physician.  No radiographic interpretation.   DG C-Arm 1-60 Min-No Report  Result Date: 05/28/2022 Fluoroscopy was utilized by the requesting physician.  No radiographic interpretation.   MR Cervical Spine W or Wo Contrast  Result Date: 06/04/2022 CLINICAL DATA:  Quadriparesis EXAM: MRI CERVICAL SPINE WITHOUT AND WITH CONTRAST TECHNIQUE: Multiplanar and multiecho pulse sequences of the cervical spine, to include the craniocervical junction and cervicothoracic junction, were obtained without and with intravenous contrast. CONTRAST:  83m GADAVIST GADOBUTROL 1 MMOL/ML IV SOLN COMPARISON:  CT cervical spine 06/06/2022 FINDINGS: Alignment: Normal Vertebrae: There is abnormal T1 and T2-weighted signal within the C4 and C5 vertebral bodies with endplate abnormalities characterized on the earlier CT, consistent with discitis-osteomyelitis. Cord: There is hyperintense T2-weighted signal throughout much of the  cervical spinal cord. Spinal cord is compressed at the C3-5 levels by a large ventral epidural abscess that measures 3.4 cm in craniocaudal dimension and 0.7 cm in thickness. Posterior Fossa, vertebral arteries, paraspinal tissues: There is a prevertebral phlegmon/abscess measuring up to 13 mm in thickness. Disc levels: Aside from the above described, the disc levels are unremarkable. IMPRESSION: 1. 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 2. Prevertebral phlegmon/abscess measuring up to 13 mm in thickness. Critical Value/emergent results were called by telephone at the time of interpretation on 06/13/2022 at 3:49 am to provider EQuintella Reichert who verbally acknowledged these results. Electronically Signed   By: KUlyses JarredM.D.   On: 06/02/2022 03:50   CT Head Wo Contrast  Result Date: 06/14/2022 CLINICAL DATA:  Neck pain with bilateral upper and lower extremity weakness EXAM: CT HEAD WITHOUT CONTRAST CT CERVICAL SPINE WITHOUT CONTRAST TECHNIQUE: Multidetector CT imaging of the head and cervical spine was performed following the standard protocol  without intravenous contrast. Multiplanar CT image reconstructions of the cervical spine were also generated. RADIATION DOSE REDUCTION: This exam was performed according to the departmental dose-optimization program which includes automated exposure control, adjustment of the mA and/or kV according to patient size and/or use of iterative reconstruction technique. COMPARISON:  CTA head neck 04/28/2022 FINDINGS: CT HEAD FINDINGS Brain: There is no mass, hemorrhage or extra-axial collection. The size and configuration of the ventricles and extra-axial CSF spaces are normal. There is hypoattenuation of the periventricular white matter, most commonly indicating chronic ischemic microangiopathy. Vascular: No abnormal hyperdensity of the major intracranial arteries or dural venous sinuses. No intracranial  atherosclerosis. Skull: The visualized skull base, calvarium and extracranial soft tissues are normal. Sinuses/Orbits: No fluid levels or advanced mucosal thickening of the visualized paranasal sinuses. No mastoid or middle ear effusion. The orbits are normal. CT CERVICAL SPINE FINDINGS Alignment: No static subluxation. Facets are aligned. Occipital condyles are normally positioned. Skull base and vertebrae: Previously seen gas dorsal to the C4-5 disc spaces resolved. There is progressive endplate erosion of the opposing C4-5 endplates. Soft tissues and spinal canal: There is prevertebral soft tissue swelling extending from C1-C5. Disc levels: As above, there are endplate irregularities at C4-5. Upper chest: No pneumothorax, pulmonary nodule or pleural effusion. Other: Normal visualized paraspinal cervical soft tissues. IMPRESSION: 1. No acute intracranial abnormality. 2. Progressive endplate erosion of the opposing C4-5 endplates with prevertebral soft tissue swelling extending from C1-C5. 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 Given the upper and lower extremity weakness, an MRI of the cervical spine with and without contrast should be obtained to assess for a possible epidural abscess causing cord compression. 3. Prevertebral soft tissue swelling at the C2-5 levels is likely reactive. No discrete retropharyngeal abscess is visible. Electronically Signed   By: Ulyses Jarred M.D.   On: 06/09/2022 01:25   CT Cervical Spine Wo Contrast  Result Date: 06/02/2022 CLINICAL DATA:  Neck pain with bilateral upper and lower extremity weakness EXAM: CT HEAD WITHOUT CONTRAST CT CERVICAL SPINE WITHOUT CONTRAST TECHNIQUE: Multidetector CT imaging of the head and cervical spine was performed following the standard protocol without intravenous contrast. Multiplanar CT image reconstructions of the cervical spine were also generated. RADIATION DOSE REDUCTION: This exam was performed according to the departmental  dose-optimization program which includes automated exposure control, adjustment of the mA and/or kV according to patient size and/or use of iterative reconstruction technique. COMPARISON:  CTA head neck 04/28/2022 FINDINGS: CT HEAD FINDINGS Brain: There is no mass, hemorrhage or extra-axial collection. The size and configuration of the ventricles and extra-axial CSF spaces are normal. There is hypoattenuation of the periventricular white matter, most commonly indicating chronic ischemic microangiopathy. Vascular: No abnormal hyperdensity of the major intracranial arteries or dural venous sinuses. No intracranial atherosclerosis. Skull: The visualized skull base, calvarium and extracranial soft tissues are normal. Sinuses/Orbits: No fluid levels or advanced mucosal thickening of the visualized paranasal sinuses. No mastoid or middle ear effusion. The orbits are normal. CT CERVICAL SPINE FINDINGS Alignment: No static subluxation. Facets are aligned. Occipital condyles are normally positioned. Skull base and vertebrae: Previously seen gas dorsal to the C4-5 disc spaces resolved. There is progressive endplate erosion of the opposing C4-5 endplates. Soft tissues and spinal canal: There is prevertebral soft tissue swelling extending from C1-C5. Disc levels: As above, there are endplate irregularities at C4-5. Upper chest: No pneumothorax, pulmonary nodule or pleural effusion. Other: Normal visualized paraspinal cervical soft tissues. IMPRESSION: 1. No acute intracranial abnormality. 2. Progressive endplate erosion  of the opposing C4-5 endplates with prevertebral soft tissue swelling extending from C1-C5. 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 Given the upper and lower extremity weakness, an MRI of the cervical spine with and without contrast should be obtained to assess for a possible epidural abscess causing cord compression. 3. Prevertebral soft tissue swelling at the C2-5 levels is likely reactive. No discrete  retropharyngeal abscess is visible. Electronically Signed   By: Ulyses Jarred M.D.   On: 05/28/2022 01:25   DG Chest Portable 1 View  Result Date: 06/13/2022 CLINICAL DATA:  Dyspnea. EXAM: PORTABLE CHEST 1 VIEW COMPARISON:  Radiograph 04/29/2022, CT 04/27/2022 FINDINGS: Prior median sternotomy and CABG. Lung volumes are low. Heart size difficult to assess but upper normal in size. Aortic atherosclerosis, stable mediastinal contours. Increased interstitial markings are again seen, slight improvement on the left from prior. There may be a small right pleural effusion. No confluent consolidation or pneumothorax. IMPRESSION: 1. Low lung volumes. Increased interstitial opacities with slight improvement on the left from prior exam, may be chronic or represent pulmonary edema. 2. Possible small right pleural effusion. Electronically Signed   By: Keith Rake M.D.   On: 06/12/2022 23:04     Assessment/Plan:  Cervical epidural abscess with cord compression = continue with vancomycin and ceftriaxone. He previously had recent hx of h.influenza meningitis/ventricultis and s.anginosus bacteremia which may have been the precursor/seeding event.  Will continue to monitor culture results to see if need to change abtx course  Therapeutic drug monitoring = will follow vanco levels to ensure to minimize vanco toxicity  Neuro deficits from cord compression = difficult to estimate of gain of function this early.defer to primary team  Muscle spasm = recommend to use robaxin or flexeril to see if improvement in symptoms  [facility] Pager: [phone]  05/23/2022, 4:07 PM

## 2022-05-23 NOTE — Progress Notes (Addendum)
NAME:  [name], MRN:  [id number], DOB:  1941-03-10, LOS: 1 ADMISSION DATE:  06/13/2022, CONSULTATION DATE:  05/27/2022 REFERRING MD:  Cyndia Skeeters, CHIEF COMPLAINT:  Management post op epidural abscess   History of Present Illness:  [name] is a 81 y.o. 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  Per family, pt developed rapidly worsening weakness in all extremities and cough over the last 1-2 days.  EMS was called and pt required non-rebreather mask en route to the ED.       ED work-up revealed finding progressive endplate erosions of the C4-5 endplates with prevertebral soft tissue swelling extending from C1-C5 indicating a discitis and osteomyelitis on CT scan.  Neurosurgery was consulted and family opted for aggressive treatment with of C4 corpectomy and reconstruction.    He was intubated and to be transferred to ICU post-op.   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  Significant Hospital Events: Including procedures, antibiotic start and stop dates in addition to other pertinent events   6/7: Presented with new quadriplegia and found to have epidural abscess, to OR for of C4 corpectomy and reconstruction, left intubated and PCCM consulted, ID consulted. Gram stain from OR  demonstrating GNR and GPC.   Interim History / Subjective:   Afebrile overnight.  Blood pressure within normal limits overnight off Neo.   This AM, patient is alert and answering questions by nodding. He expresses understanding of what brought him in to the hospital and the events that transpired. He denies any pain at this time.   Objective   Blood pressure (!) 116/58, pulse 72, temperature 98.3 F (36.8 C), temperature source Axillary, resp. rate 18, height '5\' 6"'$  (1.676 m), weight 88.4 kg, SpO2 98 %.    Vent Mode: PRVC FiO2 (%):  [15 %-100 %] 70 % Set Rate:  [18 bmp] 18 bmp Vt Set:  [510 mL] 510 mL PEEP:  [5 cmH20] 5 cmH20 Plateau Pressure:  [16 cmH20-17 cmH20] 16 cmH20   Intake/Output Summary (Last 24 hours) at 05/23/2022 0744 Last data filed at 05/23/2022 0704 Gross per 24 hour  Intake 2275.29 ml  Output 1475 ml  Net 800.29 ml    Filed Weights   05/27/2022 1229  Weight: 88.4 kg   Examination:  General: Acute on chronically ill appearing male. Resting comfortably.  Neuro: Alert and answering questions appropriately. Minimal movements of the bilateral upper extremities noted, unable to give thumbs up bilaterally. No movement of the bilateral lower extremities. No withdrawal to noxious stimuli.  HEENT: Anterior neck dressing C/D/I with JP drain in place. Hard neck collar in place. Sclerae anicteric. ETT in place. Cardiovascular: RRR, no M/R/G.  Lungs: Coarse breathe sounds bilaterally. No increased work of breathing or tachypnea. No wheezes or rales.  Abdomen: BS x 4, soft, NT/ND.  Musculoskeletal: No gross deformities, no edema.  Skin: Intact, warm, no rashes.  Assessment & Plan:   # C4-C5 Discitis-Osteomyelitis c/b  Epidural Abscess with spinal compression s/p C4 corpectomy, C3-C5 arthrodesis, C3-C5 anterior cervical plating # History of H. Influenza Meningitis ([place] 04/28/2022) Mr. Overby presented with acute weakness of all extremities with imaging consistent with discitis  complicated by large phlegmon. Given recent history of H. Influenza meningitis, it is possible this is direct seeding, however patient completed appropriate therapy. Gram stain with both GNR and GPC.   Of note, patient had a CTA head on 5/14 that noted gas in the C3-4 ventral canal but at the time, it was suspected to be secondary to herniation. Given imaging results yesterday, I am suspicious that the gas visualized may have been the first evidence of infection.   - Neurosurgery and ID following; appreciate their recommendations  - Continue broad spectrum antibiotics with Rocephin and Vancomycin  - Follow up surgical cultures - Post-op management per NSGY  # Acute on chronic hypoxic respiratory failure # History of COPD  Recently admitted for AHRF 2/2 CHF exacerbation and aspiration pneumonia. Prior to arrival, he was on 3L [place]. No evidence of COPD exacerbation at this time, although will given Duoneb treatment prior to extubation given coarse breathe sounds. Expect patient will be extubated today.   - Full vent support - VAP bundle - Daily SAT and SBT - Continue home bronchodilators   # History of CVA  # History of Vocal Cord Paralysis  Recently admitted for respiratory failure and found to have a left posterior frontotemporal infarct. Likely secondary to small vessel disease. 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 Follows with Dr. Radford Pax at [facility] home Metoprolol, Atorvastatin,  - Holding home Lasix and Amlodipine   # Type 2 Diabetes Mellitus  - CBG goal 140-180 - SSI q4h, moderate   # Chronic Urinary retention - Continue home foley  - Holding home Silodosin   # Acute on Chronic Normocytic Anemia Hemoglobin lower than baseline on admission. Will  monitor closely. No indication for transfusion.   - Daily CBC while admitted   RESOLVED:  # Hypotension - Sedation related  Best Practice (right click and "Reselect all SmartList Selections" daily)   Diet/type: tubefeeds DVT prophylaxis: Lovenox  GI prophylaxis: PPI Lines: N/A Foley:  Yes, and it is still needed Code Status:  DNR Last date of multidisciplinary goals of care discussion [Pending]      Dr. Jose Persia Internal Medicine PGY-3  05/23/2022, 10:07 AM

## 2022-05-24 DIAGNOSIS — L8990 Pressure ulcer of unspecified site, unspecified stage: Secondary | ICD-10-CM | POA: Insufficient documentation

## 2022-05-24 LAB — PHOSPHORUS: Phosphorus: 2.2 mg/dL — ABNORMAL LOW (ref 2.5–4.6)

## 2022-05-24 LAB — COMPREHENSIVE METABOLIC PANEL
ALT: 15 U/L (ref 0–44)
AST: 13 U/L — ABNORMAL LOW (ref 15–41)
Albumin: 2.2 g/dL — ABNORMAL LOW (ref 3.5–5.0)
Alkaline Phosphatase: 82 U/L (ref 38–126)
Anion gap: 9 (ref 5–15)
BUN: 22 mg/dL (ref 8–23)
CO2: 27 mmol/L (ref 22–32)
Calcium: 8.2 mg/dL — ABNORMAL LOW (ref 8.9–10.3)
Chloride: 103 mmol/L (ref 98–111)
Creatinine, Ser: 0.64 mg/dL (ref 0.61–1.24)
GFR, Estimated: >60 mL/min (ref >60)
Glucose, Bld: 108 mg/dL — ABNORMAL HIGH (ref 70–99)
Potassium: 4.1 mmol/L (ref 3.5–5.1)
Sodium: 139 mmol/L (ref 135–145)
Total Bilirubin: 0.5 mg/dL (ref 0.3–1.2)
Total Protein: 5.4 g/dL — ABNORMAL LOW (ref 6.5–8.1)

## 2022-05-24 LAB — CBC WITH DIFFERENTIAL/PLATELET
Abs Immature Granulocytes: 0.08 10*3/uL — ABNORMAL HIGH (ref 0.00–0.07)
Basophils Absolute: 0 10*3/uL (ref 0.0–0.1)
Basophils Relative: 0 %
Eosinophils Absolute: 0 10*3/uL (ref 0.0–0.5)
Eosinophils Relative: 0 %
HCT: 29.9 % — ABNORMAL LOW (ref 39.0–52.0)
Hemoglobin: 9.2 g/dL — ABNORMAL LOW (ref 13.0–17.0)
Immature Granulocytes: 1 %
Lymphocytes Relative: 13 %
Lymphs Abs: 1 10*3/uL (ref 0.7–4.0)
MCH: 28.8 pg (ref 26.0–34.0)
MCHC: 30.8 g/dL (ref 30.0–36.0)
MCV: 93.4 fL (ref 80.0–100.0)
Monocytes Absolute: 0.9 10*3/uL (ref 0.1–1.0)
Monocytes Relative: 12 %
Neutro Abs: 5.6 10*3/uL (ref 1.7–7.7)
Neutrophils Relative %: 74 %
Platelets: 203 10*3/uL (ref 150–400)
RBC: 3.2 MIL/uL — ABNORMAL LOW (ref 4.22–5.81)
RDW: 13.2 % (ref 11.5–15.5)
WBC: 7.6 10*3/uL (ref 4.0–10.5)
nRBC: 0 % (ref 0.0–0.2)

## 2022-05-24 LAB — MAGNESIUM: Magnesium: 1.9 mg/dL (ref 1.7–2.4)

## 2022-05-24 LAB — GLUCOSE, CAPILLARY
Glucose-Capillary: 117 mg/dL — ABNORMAL HIGH (ref 70–99)
Glucose-Capillary: 126 mg/dL — ABNORMAL HIGH (ref 70–99)
Glucose-Capillary: 119 mg/dL — ABNORMAL HIGH (ref 70–99)

## 2022-05-24 LAB — VANCOMYCIN, TROUGH: Vancomycin Tr: 40 ug/mL (ref 15–20)

## 2022-05-24 MED ORDER — DIPHENHYDRAMINE HCL 50 MG/ML IJ SOLN: 25.0000 mg | INTRAMUSCULAR | Status: DC | PRN

## 2022-05-24 MED ORDER — MORPHINE SULFATE (PF) 2 MG/ML IV SOLN: 2.0000 mg | INTRAVENOUS | Status: DC | PRN

## 2022-05-24 MED ORDER — ACETAMINOPHEN 650 MG RE SUPP: 650.0000 mg | Freq: Four times a day (QID) | RECTAL | Status: DC | PRN

## 2022-05-24 MED ORDER — ARFORMOTEROL TARTRATE 15 MCG/2ML IN NEBU
15.0000 ug | INHALATION_SOLUTION | Freq: Two times a day (BID) | RESPIRATORY_TRACT | Status: DC
Start: 2022-05-24 — End: 2022-05-25
  Administered 2022-05-24: 15 ug via RESPIRATORY_TRACT
  Filled 2022-05-24 (×2): qty 2

## 2022-05-24 MED ORDER — MORPHINE BOLUS VIA INFUSION
5.0000 mg | INTRAVENOUS | Status: DC | PRN
  Administered 2022-05-24 (×2): 5 mg via INTRAVENOUS

## 2022-05-24 MED ORDER — ACETAMINOPHEN 325 MG PO TABS: 650.0000 mg | ORAL_TABLET | Freq: Four times a day (QID) | ORAL | Status: DC | PRN

## 2022-05-24 MED ORDER — GLYCOPYRROLATE 1 MG PO TABS: 1.0000 mg | ORAL_TABLET | ORAL | Status: DC | PRN

## 2022-05-24 MED ORDER — GLYCOPYRROLATE 0.2 MG/ML IJ SOLN: 0.2000 mg | INTRAMUSCULAR | Status: DC | PRN

## 2022-05-24 MED ORDER — REVEFENACIN 175 MCG/3ML IN SOLN
175.0000 ug | Freq: Every day | RESPIRATORY_TRACT | Status: DC
Start: 2022-05-24 — End: 2022-05-25
  Filled 2022-05-24 (×2): qty 3

## 2022-05-24 MED ORDER — IPRATROPIUM-ALBUTEROL 0.5-2.5 (3) MG/3ML IN SOLN
3.0000 mL | Freq: Once | RESPIRATORY_TRACT | Status: AC
  Administered 2022-05-24: 3 mL via RESPIRATORY_TRACT
  Filled 2022-05-24: qty 3

## 2022-05-24 MED ORDER — MORPHINE 100MG IN NS 100ML (1MG/ML) PREMIX INFUSION
0.0000 mg/h | INTRAVENOUS | Status: DC
  Administered 2022-05-24: 5 mg/h via INTRAVENOUS
  Filled 2022-05-24: qty 100

## 2022-05-24 MED ORDER — BUDESONIDE 0.25 MG/2ML IN SUSP
0.2500 mg | Freq: Two times a day (BID) | RESPIRATORY_TRACT | Status: DC
  Administered 2022-05-24: 0.25 mg via RESPIRATORY_TRACT
  Filled 2022-05-24 (×2): qty 2

## 2022-05-24 MED ORDER — MORPHINE SULFATE (PF) 2 MG/ML IV SOLN
1.0000 mg | INTRAVENOUS | Status: DC | PRN
  Administered 2022-05-24 (×2): 1 mg via INTRAVENOUS
  Filled 2022-05-24 (×2): qty 1

## 2022-05-24 MED ORDER — GLYCOPYRROLATE 0.2 MG/ML IJ SOLN
0.2000 mg | INTRAMUSCULAR | Status: DC | PRN
  Administered 2022-05-24: 0.2 mg via INTRAVENOUS
  Filled 2022-05-24: qty 1

## 2022-05-24 MED ORDER — POLYVINYL ALCOHOL 1.4 % OP SOLN: 1.0000 [drp] | Freq: Four times a day (QID) | OPHTHALMIC | Status: DC | PRN

## 2022-05-24 MED ORDER — DEXTROSE 5 % IV SOLN: INTRAVENOUS | Status: DC

## 2022-05-27 LAB — AEROBIC/ANAEROBIC CULTURE W GRAM STAIN (SURGICAL/DEEP WOUND)
Gram Stain: NONE SEEN
Gram Stain: NONE SEEN

## 2022-05-27 LAB — CULTURE, BLOOD (ROUTINE X 2)
Culture: NO GROWTH
Culture: NO GROWTH
Special Requests: ADEQUATE

## 2022-06-15 NOTE — Plan of Care (Signed)
Interval Progress Note:   Paged by RN regarding increasing tachycardia, tachypnea and oxygen requirement to maintain saturations above 88%.   Patient assessed. He is tachycardic with regular rhythm. Increased work of breathing with tachypnea. Patient is obtunded, non-responsive to voice.   [name] wife was at bedside. We discussed that his respiratory failure is progressing quickly despite addition of triple therapy bronchodilators and PRN Duonebs. He has been receiving appropriate antibiotics but the inflammation of the C3-C5 has led to significant diaphragmatic weakening.  He is now actively passing. [name] expressed understanding. She would like to respect [name] wishes of DNI/DNR and focus on relief of respiratory distress, stating she just doesn't want him to suffer. We discussed that a morphine infusion would be next step given morphine boluses have not alleviated his symptoms. I explained the risks and benefits of morphine infusion. She is in agreement to continue with morphine infusion.  Assessment/Plan:   # Goals of Care: Patient is actively dying at this time due to respiratory failure. Will direct our focus on comfort per family wishes.   - DNI/DNR - Start Morphine infusion - Liberalize visitation  - Discontinue un-necessary lab draws and vital

## 2022-06-15 NOTE — TOC Initial Note (Signed)
Transition of Care [facility]) - Initial/Assessment Note    Patient Details  Name: [name] MRN: [id number] Date of Birth: 1941/03/27  [facility] of [facility]) CM/SW Contact:    Benard Halsted, LCSW Phone Number: 05-27-2022, 12:13 PM  Clinical Narrative:                 CSW received request from [facility], where he has been completing short term rehab. They reported patient will require a new pasrr as it expires on 6/11 and he will also require insurance approval. CSW will continue to follow.   Expected Discharge Plan: Skilled Nursing Facility Barriers to Discharge: Insurance Authorization, Continued Medical Work up   Patient Goals and CMS Choice Patient states their goals for this hospitalization and ongoing recovery are:: SNF CMS Medicare.gov Compare Post Acute Care list provided to:: Patient Represenative (must comment) Choice offered to / list presented to : Spouse  Expected Discharge Plan and Services Expected Discharge Plan: [place] In-house Referral: Clinical Social Work   Post Acute Care Choice: [place] Living arrangements for the past 2 months: [place]                                      Prior Living Arrangements/Services Living arrangements for the past 2 months: Single Family Home Lives with:: Spouse Patient language and need for interpreter reviewed:: Yes Do you feel safe going back to the place where you live?: Yes      Need for Family Participation in Patient Care: Yes (Comment) Care giver support system in place?: Yes (comment)   Criminal Activity/Legal Involvement Pertinent to Current Situation/Hospitalization: No - Comment as needed  Activities of Daily Living      Permission Sought/Granted Permission sought to share information with : Facility Sport and exercise psychologist, Family Supports Permission granted to share information with : Yes, Verbal Permission Granted  Share Information with NAME:  Hoyle Sauer  Permission granted to share info w AGENCY: SNF  Permission granted to share info w Relationship: Spouse  Permission granted to share info w Contact Information: [phone]  Emotional Assessment Appearance:: Appears stated age Attitude/Demeanor/Rapport: Gracious Affect (typically observed): Accepting, Appropriate Orientation: : Oriented to Self, Oriented to Place, Oriented to  Time, Oriented to Situation Alcohol / Substance Use: Not Applicable Psych Involvement: No (comment)  Admission diagnosis:  Epidural abscess [G06.2] Cord compression (HCC) [G95.20] Cervical discitis [M46.42] Acute osteomyelitis of cervical spine (HCC) [M46.22] Patient Active Problem List   Diagnosis Date Noted   Pressure injury of skin 05/27/2022   Protein-calorie malnutrition, severe 05/23/2022   Acute osteomyelitis of cervical spine (HCC) 06/04/2022   Glaucoma 05/16/2022   Cervical discitis 06/02/2022   Chronic respiratory failure with hypoxia ([place]) 06/12/2022   Urinary retention 05/30/2022   S/P percutaneous endoscopic gastrostomy (PEG) tube placement ([place]) 05/18/2022   Abscess in epidural space of cervical spine 05/20/2022   Dysphonia    Paresis of right vocal cord    Ventriculitis of brain due to bacteria    Meningitis    Severe sepsis (HCC)    Acute on chronic systolic (congestive) heart failure ([place]) 04/25/2022   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/2022   Neck mass 04/25/2022   Tremor 04/25/2022   Morbid obesity ([place]) 04/28/2018   Chronic combined systolic and diastolic congestive heart failure ([place]) 04/28/2018   CAP (community acquired pneumonia) due to Pneumococcus ([place]) 03/03/2017  Acute respiratory failure with hypoxia (HCC)    Pneumonia 03/02/2017   COPD (chronic obstructive pulmonary disease) ([place]) 03/02/2017   Acute on chronic respiratory failure with hypoxia ([place]) 03/02/2017   Type 2 diabetes mellitus without complication, without long-term current use of  insulin ([place]) 03/02/2017   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), bilateral 05/14/2016   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[place])    Claudication ([place])    Kidney stones    Essential hypertension    Coronary artery disease involving native coronary artery of native heart without angina pectoris    Depression    Anxiety    Hypersomnia    Dyslipidemia    PCP:  Seward Carol, MD Pharmacy:   [facility] [phone] [address], [address] AT [facility] [address] [place] [address] Phone: [phone] Fax: [phone]     Social Determinants of Health (SDOH) Interventions    Readmission Risk Interventions    05/09/2022    2:11 PM  Readmission Risk Prevention Plan  Transportation Screening Complete  Home Care Screening Complete

## 2022-06-15 NOTE — Progress Notes (Addendum)
Subjective: Patient reports that he is feeling slightly better than yesterday but continues to have a feeling of general malaise. Dysphonia slightly improved, speech moderately incomprehensible but appears appropriate. No acute events overnight.  Objective: Vital signs in last 24 hours: Temp:  [97.4 F (36.3 C)-98.5 F (36.9 C)] 98.2 F (36.8 C) (06/09 0400) Pulse Rate:  [68-100] 99 (06/09 0500) Resp:  [9-25] 17 (06/09 0500) BP: (97-123)/(46-88) 105/53 (06/09 0500) SpO2:  [87 %-98 %] 90 % (06/09 0500) FiO2 (%):  [40 %-70 %] 40 % (06/08 0906)  Intake/Output from previous day: 06/08 0701 - 06/09 0700 In: 1892.6 [I.V.:617.2; NG/GT:625.2; IV Piggyback:550.2] Out: 8416 [Urine:1175; Drains:10] Intake/Output this shift: Total I/O In: 629.6 [I.V.:8; Other:100; NG/GT:440; IV Piggyback:81.6] Out: 230 [Urine:225; Drains:5]  Physical Exam: Patient is awake and alert. Dysphonia slightly improved. Speech is moderately incomprehensible. He nods appropriately. Eyes open spontaneously. He is able to follow all commands. RUE 3/5, LUE 2/5, BLE 1/5. Sensation to light touch is impaired but continues to slowly improve. PERLA, EOMI. CNs grossly intact. Dressing is clean dry intact. Incision is well approximated with no drainage, erythema, or edema. Hemovac with approximately 10 ml of sanguineous  drainage. Hard cervical collar in place    Lab Results: Recent Labs    06/04/2022 1927 05/23/22 0340 05/23/22 0508  WBC 8.7  --  8.1  HGB 9.2* 9.2* 9.1*  HCT 28.9* 27.0* 28.4*  PLT 195  --  181   BMET Recent Labs    05/23/2022 0424 05/23/22 0340 05/23/22 0508  NA 136 137 138  K 3.9 4.3 4.2  CL 103  --  103  CO2 25  --  25  GLUCOSE 132*  --  127*  BUN 23  --  15  CREATININE 0.69  --  0.63  CALCIUM 8.3*  --  8.2*    Studies/Results: Portable Chest xray  Result Date: 05/23/2022 CLINICAL DATA:  Intubated EXAM: PORTABLE CHEST 1 VIEW COMPARISON:  05/18/2022 FINDINGS: Endotracheal tube 3.3 cm above  the carina. NG tube extends below the hemidiaphragms into the stomach with the tip not visualized. Similar low lung volumes with vascular congestion and basilar atelectasis/consolidation. Difficult to exclude basilar pneumonia. No enlarging effusion or pneumothorax. Stable mild cardiomegaly. Previous coronary bypass changes noted. Aorta atherosclerotic. IMPRESSION: Support apparatus in good position. Similar low lung volumes with bibasilar atelectasis/consolidation. Electronically Signed   By: Jerilynn Mages.  Shick M.D.   On: 05/23/2022 08:16   DG Abd 1 View  Result Date: 05/29/2022 CLINICAL DATA:  Check gastric catheter placement EXAM: ABDOMEN - 1 VIEW COMPARISON:  None Available. FINDINGS: Gastric catheter is noted within the stomach. Gastrostomy catheter is noted in place as well. No obstructive changes or free air are seen. IMPRESSION: Gastric catheter within the stomach. Electronically Signed   By: Inez Catalina M.D.   On: 06/06/2022 21:25   DG Cervical Spine 2 or 3 views  Result Date: 06/09/2022 CLINICAL DATA:  Fluoroscopic assistance for cervical disc fusion EXAM: CERVICAL SPINE - 2-3 VIEW COMPARISON:  MR cervical spine done on FINDINGS: Fluoroscopic images show interval anterior surgical fusion from C3-C6 levels. Fluoroscopic time was 20 seconds. Radiation dose 2.42 mGy. IMPRESSION: Fluoroscopic assistance was provided for anterior surgical fusion from C3-C6 levels. Electronically Signed   By: Elmer Picker M.D.   On: 05/16/2022 17:35   DG C-Arm 1-60 Min-No Report  Result Date: 05/16/2022 Fluoroscopy was utilized by the requesting physician.  No radiographic interpretation.   DG C-Arm 1-60 Min-No Report  Result Date:  05/20/2022 Fluoroscopy was utilized by the requesting physician.  No radiographic interpretation.   DG C-Arm 1-60 Min-No Report  Result Date: 06/09/2022 Fluoroscopy was utilized by the requesting physician.  No radiographic interpretation.    Assessment/Plan: Patient is post-op day 2  s/p C4 corpectomy with anterior cervical plate C3-C5 due to cervical discitis/osteomyelitis with epidural abscess. He was extubated yesterday. 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 He appears to nod appropriately to questions. Some usable speech this morning but overall poor voice quality. Continue supportive care.      -CCM medical management, appreciate assistance  -Discontinue Hemovac -Continue hard cervical collar -PT/OT when appropriate -ID consult -Continue ICU due to high risk of reintubation from high cervical involvement    LOS: 2 days     Marvis Moeller, DNP, AGNP-C Neurosurgery Nurse Practitioner  [facility] [place]. [address], [place], [place] [address] P: [phone]    F: [phone]  06-04-22 5:56 AM

## 2022-06-15 NOTE — Progress Notes (Addendum)
Pt observed to have increased work of breathing, RN gave bolus of Morphine and titrated continuous drip up for Pt's comfort. Family at bedside educated. Prior to RN leaving the room Pt appeared to be resting more comfortably.

## 2022-06-15 NOTE — Progress Notes (Cosign Needed)
RE: [name] Date of Birth: September 13, 2041 Date: 16-Jun-2022  Please be advised that the above-named patient will require a short-term nursing home stay - anticipated 30 days or less for rehabilitation and strengthening.  The plan is for return home.

## 2022-06-15 NOTE — NC FL2 (Signed)
[place] LEVEL OF CARE SCREENING TOOL     IDENTIFICATION  Patient Name: [name] Birthdate: 1941/11/22 Sex: male Admission Date (Current Location): 05/25/2022  [facility] and [place] Number:  Herbalist and Address:  The . [facility], [place] [address] [place], [place] [address]      Provider Number: [id number]  Attending Physician Name and Address:  Kipp Brood, MD  Relative Name and Phone Number:       Current Level of Care: Hospital Recommended Level of Care: [place] Prior Approval Number:    Date Approved/Denied:   PASRR Number: pending  Discharge Plan: SNF    Current Diagnoses: Patient Active Problem List   Diagnosis Date Noted   Pressure injury of skin June 06, 2022   Protein-calorie malnutrition, severe 05/23/2022   Acute osteomyelitis of cervical spine ([place]) 06/10/2022   Glaucoma 05/27/2022   Cervical discitis 05/23/2022   Chronic respiratory failure with hypoxia ([place]) 06/12/2022   Urinary retention 06/06/2022   S/P percutaneous endoscopic gastrostomy (PEG) tube placement ([place]) 05/31/2022   Abscess in epidural space of cervical spine 06/01/2022   Dysphonia    Paresis of right vocal cord    Ventriculitis of brain due to bacteria    Meningitis    Severe sepsis ([place])    Acute on chronic systolic (congestive) heart failure ([place]) 04/25/2022   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/2022   Neck mass 04/25/2022   Tremor 04/25/2022   Morbid obesity ([place]) 04/28/2018   Chronic combined systolic and diastolic congestive heart failure ([place]) 04/28/2018   CAP (community acquired pneumonia) due to Pneumococcus ([place]) 03/03/2017   Acute respiratory failure with hypoxia (HCC)    Pneumonia 03/02/2017   COPD (chronic obstructive pulmonary disease) ([place]) 03/02/2017   Acute on chronic respiratory failure with hypoxia ([place]) 03/02/2017   Type 2 diabetes mellitus without complication, without long-term  current use of insulin ([place]) 03/02/2017   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), bilateral 05/14/2016   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HCC)    Claudication ([place])    Kidney stones    Essential hypertension    Coronary artery disease involving native coronary artery of native heart without angina pectoris    Depression    Anxiety    Hypersomnia    Dyslipidemia     Orientation RESPIRATION BLADDER Height & Weight     Self, Time, Place, Situation  O2 (Nasal cannula) Continent, Indwelling catheter Weight: 194 lb 14.2 oz (88.4 kg) Height:  '5\' 6"'$  (167.6 cm)  BEHAVIORAL SYMPTOMS/MOOD NEUROLOGICAL BOWEL NUTRITION STATUS      Continent Feeding tube  AMBULATORY STATUS COMMUNICATION OF NEEDS Skin   Extensive Assist Verbally PU Stage and Appropriate Care, Surgical wounds, Other (Comment) (Stage I on coccyx; closed incision on cervical; MASD on buttocks)                       Personal Care Assistance Level of Assistance  Bathing, Feeding, Dressing Bathing Assistance: Maximum assistance Feeding assistance: Limited assistance Dressing Assistance: Limited assistance     Functional Limitations Info             SPECIAL CARE FACTORS FREQUENCY  PT (By licensed PT), OT (By licensed OT)     PT Frequency: 5x/week OT Frequency: 5x/week            Contractures Contractures Info: Not present    Additional Factors Info  Code Status, Allergies, Insulin Sliding Scale Code Status Info: DNR  Allergies Info: NKA   Insulin Sliding Scale Info: See dc summary       Current Medications (06/02/2022):  This is the current hospital active medication list Current Facility-Administered Medications  Medication Dose Route Frequency Provider Last Rate Last Admin   0.9 %  sodium chloride infusion  250 mL Intravenous Continuous Vallarie Mare, MD 1 mL/hr at 06/02/22 0300 Infusion Verify at Jun 02, 2022 0300   acetaminophen (TYLENOL) tablet 650 mg  650 mg Per Tube Q6H PRN Vallarie Mare, MD        Or   acetaminophen (TYLENOL) suppository 650 mg  650 mg Rectal Q6H PRN Vallarie Mare, MD       arformoterol [facility]) nebulizer solution 15 mcg  15 mcg Nebulization BID Jose Persia, MD   15 mcg at 2022-06-02 6568   aspirin chewable tablet 81 mg  81 mg Per Tube Daily Jose Persia, MD   81 mg at 2022/06/02 0929   atorvastatin (LIPITOR) tablet 40 mg  40 mg Per Tube Daily Vallarie Mare, MD   40 mg at 06-02-22 0929   budesonide (PULMICORT) nebulizer solution 0.25 mg  0.25 mg Nebulization BID Jose Persia, MD   0.25 mg at 2022-06-02 0824   cefTRIAXone (ROCEPHIN) 2 g in sodium chloride 0.9 % 100 mL IVPB  2 g Intravenous Q12H Vallarie Mare, MD 200 mL/hr at June 02, 2022 0423 2 g at 06-02-22 0423   chlorhexidine (PERIDEX) 0.12 % solution 15 mL  15 mL Mouth Rinse BID Kipp Brood, MD   15 mL at 2022/06/02 0929   Chlorhexidine Gluconate Cloth 2 % PADS 6 each  6 each Topical Daily Desai, Rahul P, PA-C   6 each at 05/23/22 1542   docusate (COLACE) 50 MG/5ML liquid 100 mg  100 mg Per Tube BID Desai, Rahul P, PA-C   100 mg at 2022/06/02 0929   dorzolamide (TRUSOPT) 2 % ophthalmic solution 1 drop  1 drop Both Eyes BID Vallarie Mare, MD   1 drop at Jun 02, 2022 0929   enoxaparin (LOVENOX) injection 40 mg  40 mg Subcutaneous Q24H Vallarie Mare, MD   40 mg at 2022-06-02 0835   feeding supplement (GLUCERNA 1.5 CAL) liquid 1,000 mL  1,000 mL Per Tube Continuous Kipp Brood, MD 55 mL/hr at 02-Jun-2022 1122 1,000 mL at 06/02/2022 1122   feeding supplement (PROSource TF) liquid 45 mL  45 mL Per Tube Daily Kipp Brood, MD   45 mL at 02-Jun-2022 0929   hydrALAZINE (APRESOLINE) injection 10 mg  10 mg Intravenous Q6H PRN Vallarie Mare, MD       insulin aspart (novoLOG) injection 0-15 Units  0-15 Units Subcutaneous Q4H Kipp Brood, MD   2 Units at 2022/06/02 0835   MEDLINE mouth rinse  15 mL Mouth Rinse q12n4p Agarwala, Einar Grad, MD   15 mL at 05/23/22 1605   menthol-cetylpyridinium (CEPACOL) lozenge 3 mg   1 lozenge Oral PRN Vallarie Mare, MD       Or   phenol (CHLORASEPTIC) mouth spray 1 spray  1 spray Mouth/Throat PRN Vallarie Mare, MD       methocarbamol (ROBAXIN) 500 mg in dextrose 5 % 50 mL IVPB  500 mg Intravenous Q8H PRN Vallarie Mare, MD 100 mL/hr at 02-Jun-2022 0938 500 mg at June 02, 2022 0938   metoprolol tartrate (LOPRESSOR) 25 mg/10 mL oral suspension 12.5 mg  12.5 mg Per Tube BID Vallarie Mare, MD   12.5 mg at June 02, 2022 0928   morphine (PF)  2 MG/ML injection 1 mg  1 mg Intravenous Q1H PRN Kipp Brood, MD   1 mg at Jun 13, 2022 1131   ondansetron (ZOFRAN) tablet 4 mg  4 mg Per Tube Q6H PRN Vallarie Mare, MD       Or   ondansetron [facility]) injection 4 mg  4 mg Intravenous Q6H PRN Vallarie Mare, MD       oxyCODONE (ROXICODONE) 5 MG/5ML solution 10 mg  10 mg Per Tube Q3H PRN Kipp Brood, MD   10 mg at 05/23/22 2218   oxyCODONE (ROXICODONE) 5 MG/5ML solution 5 mg  5 mg Per Tube Q3H PRN Kipp Brood, MD       polyethylene glycol (MIRALAX / GLYCOLAX) packet 17 g  17 g Per Tube Daily Desai, Rahul P, PA-C   17 g at 13-Jun-2022 0929   revefenacin (YUPELRI) nebulizer solution 175 mcg  175 mcg Nebulization Daily Jose Persia, MD       sodium chloride 0.9 % bolus 250 mL  250 mL Intravenous Once Vallarie Mare, MD       sodium chloride flush (NS) 0.9 % injection 3 mL  3 mL Intravenous Q12H Vallarie Mare, MD   3 mL at 06/13/22 0929   sodium chloride flush (NS) 0.9 % injection 3 mL  3 mL Intravenous PRN Vallarie Mare, MD       vancomycin Alcus Dad) IVPB 750 mg/150 mL  750 mg Intravenous Q12H Vallarie Mare, MD 150 mL/hr at 06/13/22 0521 750 mg at 13-Jun-2022 4268   Facility-Administered Medications Ordered in Other Encounters  Medication Dose Route Frequency Provider Last Rate Last Admin   sodium chloride flush (NS) 0.9 % injection 10 mL  10 mL Intravenous PRN Sueanne Margarita, MD   20 mL at 03/05/22 1035     Discharge Medications: Please see discharge  summary for a list of discharge medications.  Relevant Imaging Results:  Relevant Lab Results:   Additional Information [id number], Both Covid Vaccines  Benard Halsted, LCSW

## 2022-06-15 NOTE — Progress Notes (Signed)
PT Cancellation Note  Patient Details Name: [name] MRN: [id number] DOB: [birth year]   Cancelled Treatment:    Reason Eval/Treat Not Completed: Medical issues which prohibited therapy. Per discussion with RN pt has transitioned to comfort measures. Pt not appropriate for PT intervention at this time due to respiratory distress. PT will sign off at this time.  Zenaida Niece 2022-05-28, 2:55 PM

## 2022-06-15 NOTE — Progress Notes (Signed)
[facility]    Date of Admission:  06/02/2022   Total days of antibiotics 4           ID: [name] is a 81 y.o. male with  cervical discitis/osteomyelitis with epidural abscess post-op day 2 s/p C4 corpectomy with anterior cervical plate C3-C5   Principal Problem:   Abscess in epidural space of cervical spine Active Problems:   Essential hypertension   COPD (chronic obstructive pulmonary disease) (HCC)   Acute on chronic respiratory failure with hypoxia (HCC)   Type 2 diabetes mellitus without complication, without long-term current use of insulin (HCC)   Acute osteomyelitis of cervical spine (HCC)   Cervical discitis   Urinary retention   Protein-calorie malnutrition, severe   Pressure injury of skin    Subjective: Afebrile, some improvement to parasthesia and RUE proximal strength. RT recordings of NIF are worsening concerning for high risk of need of intubation.decrease spasms  Micro culture are growing some pathogen still need addn 24-48hr for identification. Gram stain with gpc and gnr  Medications:   arformoterol  15 mcg Nebulization BID   aspirin  81 mg Per Tube Daily   atorvastatin  40 mg Per Tube Daily   budesonide (PULMICORT) nebulizer solution  0.25 mg Nebulization BID   chlorhexidine  15 mL Mouth Rinse BID   Chlorhexidine Gluconate Cloth  6 each Topical Daily   docusate  100 mg Per Tube BID   dorzolamide  1 drop Both Eyes BID   enoxaparin (LOVENOX) injection  40 mg Subcutaneous Q24H   feeding supplement (PROSource TF)  45 mL Per Tube Daily   insulin aspart  0-15 Units Subcutaneous Q4H   mouth rinse  15 mL Mouth Rinse q12n4p   metoprolol tartrate  12.5 mg Per Tube BID   pantoprazole sodium  40 mg Per Tube Daily   polyethylene glycol  17 g Per Tube Daily   revefenacin  175 mcg Nebulization Daily   sodium chloride flush  3 mL Intravenous Q12H    Objective: Vital signs in last 24 hours: Temp:  [97.4 F (36.3 C)-98.8 F (37.1  C)] 98.8 F (37.1 C) (06/09 0800) Pulse Rate:  [81-102] 96 (06/09 1100) Resp:  [9-24] 13 (06/09 1100) BP: (97-120)/(46-66) 117/56 (06/09 1100) SpO2:  [87 %-92 %] 90 % (06/09 1100)  Physical Exam  Constitutional: He is oriented to person, place, and time. He appears well-developed and well-nourished. No distress.  HENT: remains in aspen color. Wounds appropriately dressed Mouth/Throat: Oropharynx is clear and moist. No oropharyngeal exudate.  Cardiovascular: Normal rate, regular rhythm and normal heart sounds. Exam reveals no gallop and no friction rub.  No murmur heard.  Pulmonary/Chest: Effort normal and breath sounds normal. No respiratory distress. He has no wheezes.  Abdominal: Soft. Bowel sounds are normal. He exhibits no distension. There is no tenderness.  Lymphadenopathy:  He has no cervical adenopathy.  Neurological: He is alert and oriented to person, place, and time. RUE strength proximal improving raising arm above pillow. Some improvement in LUE.  Skin: Skin is warm and dry. No rash noted. No erythema.  Psychiatric: He has a normal mood and affect. His behavior is normal.    Lab Results Recent Labs    05/23/22 0508 2022/06/17 0606  WBC 8.1 7.6  HGB 9.1* 9.2*  HCT 28.4* 29.9*  NA 138 139  K 4.2 4.1  CL 103 103  CO2 25 27  BUN 15 22  CREATININE 0.63 0.64  Liver Panel Recent Labs    05/16/2022 0424 June 13, 2022 0606  PROT 5.7* 5.4*  ALBUMIN 2.4* 2.2*  AST 17 13*  ALT 23 15  ALKPHOS 95 82  BILITOT 0.9 0.5   Sedimentation Rate No results for input(s): "ESRSEDRATE" in the last 72 hours. C-Reactive Protein No results for input(s): "CRP" in the last 72 hours.  Microbiology: reviewed Studies/Results: Portable Chest xray  Result Date: 05/23/2022 CLINICAL DATA:  Intubated EXAM: PORTABLE CHEST 1 VIEW COMPARISON:  05/31/2022 FINDINGS: Endotracheal tube 3.3 cm above the carina. NG tube extends below the hemidiaphragms into the stomach with the tip not visualized.  Similar low lung volumes with vascular congestion and basilar atelectasis/consolidation. Difficult to exclude basilar pneumonia. No enlarging effusion or pneumothorax. Stable mild cardiomegaly. Previous coronary bypass changes noted. Aorta atherosclerotic. IMPRESSION: Support apparatus in good position. Similar low lung volumes with bibasilar atelectasis/consolidation. Electronically Signed   By: Jerilynn Mages.  Shick M.D.   On: 05/23/2022 08:16   DG Abd 1 View  Result Date: 06/02/2022 CLINICAL DATA:  Check gastric catheter placement EXAM: ABDOMEN - 1 VIEW COMPARISON:  None Available. FINDINGS: Gastric catheter is noted within the stomach. Gastrostomy catheter is noted in place as well. No obstructive changes or free air are seen. IMPRESSION: Gastric catheter within the stomach. Electronically Signed   By: Inez Catalina M.D.   On: 06/14/2022 21:25   DG Cervical Spine 2 or 3 views  Result Date: 05/23/2022 CLINICAL DATA:  Fluoroscopic assistance for cervical disc fusion EXAM: CERVICAL SPINE - 2-3 VIEW COMPARISON:  MR cervical spine done on FINDINGS: Fluoroscopic images show interval anterior surgical fusion from C3-C6 levels. Fluoroscopic time was 20 seconds. Radiation dose 2.42 mGy. IMPRESSION: Fluoroscopic assistance was provided for anterior surgical fusion from C3-C6 levels. Electronically Signed   By: Elmer Picker M.D.   On: 05/29/2022 17:35   DG C-Arm 1-60 Min-No Report  Result Date: 06/13/2022 Fluoroscopy was utilized by the requesting physician.  No radiographic interpretation.   DG C-Arm 1-60 Min-No Report  Result Date: 05/30/2022 Fluoroscopy was utilized by the requesting physician.  No radiographic interpretation.   DG C-Arm 1-60 Min-No Report  Result Date: 05/16/2022 Fluoroscopy was utilized by the requesting physician.  No radiographic interpretation.     Assessment/Plan: Cervical epidural abscess and osteomyelitis s/p decompression, corpectomy, HW placement for stabilization. = continue on  ceftrtiaxone plus vancomycin.will need picc line for prolonged IV abtx  Therapeutic drug monitoring = continue to follow cr and vanco levels to minimize drug toxicity  Respiratory effort = continue to measuring NIF and pulse oximetry.  [facility] Pager: [phone]  06/13/2022, 12:04 PM

## 2022-06-15 NOTE — Progress Notes (Signed)
Pt has passed away. Unable to do treatment or NIF. Family at bedside. RN aware.

## 2022-06-15 NOTE — Progress Notes (Signed)
Patient seen and examined.  Flicker of movement in hands, no movement in legs.  3/5 strength D, 2/5 biceps.  Patient reports improved paresthesias in hands.  Stable dysphonia from preop.  PEG in place. Dressing c/d  - cont C-collar - pulmonary toilet - f/u cultures

## 2022-06-15 NOTE — Progress Notes (Signed)
NAME:  [name], MRN:  [id number], DOB:  1940-12-24, LOS: 2 ADMISSION DATE:  06/03/2022, CONSULTATION DATE:  05/30/2022 REFERRING MD:  Cyndia Skeeters, CHIEF COMPLAINT:  Management post op epidural abscess   History of Present Illness:  [name] is a 81 y.o. 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  Per family, pt developed rapidly worsening weakness in all extremities and cough over the last 1-2 days.  EMS was called and pt required non-rebreather mask en route to the ED.       ED work-up revealed finding progressive endplate erosions of the C4-5 endplates with prevertebral soft tissue swelling extending from C1-C5 indicating a discitis and osteomyelitis on CT scan.  Neurosurgery was consulted and family opted for aggressive treatment with of C4 corpectomy and reconstruction.    He was intubated and to be transferred to ICU post-op.   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  Significant Hospital Events: Including procedures, antibiotic start and stop dates in addition to other pertinent events   6/7: Presented with new quadriplegia and found to have epidural abscess, to OR for of C4 corpectomy and reconstruction, left intubated and PCCM consulted, ID consulted. Gram stain from OR  demonstrating GNR and GPC.   Interim History / Subjective:   Blood pressure maintained overnight with no pressor support, although on the low end of normal.   This AM, patient states his only complaint is difficulty breathing. He feels as though his breathing had gradually become more difficult. He is feeling worn out. We discussed in detail that it is possible his diaphragm is weakened due to the C-spine infection. He expressed understanding. He states he does not want to be intubated again and this is something he has expressed to his family. He understands this may lead to his death due to respiratory failure.   Once patient's wife came to bedside, she was informed of worsening respiratory status. She states [name] informed her that he does not want to be intubated again under any circumstance. He expressed to her "he is ready to go." She would like to respect his wishes but hopes he will not suffer.   Objective   Blood pressure (!) 105/55, pulse 99, temperature 98.2 F (36.8 C), temperature source Oral, resp. rate 20, height '5\' 6"'$  (1.676 m), weight 88.4 kg, SpO2 90 %.    Vent Mode: PSV;CPAP FiO2 (%):  [40 %] 40 % Pressure Support:  [5 cmH20] 5 cmH20   Intake/Output Summary (Last 24 hours) at 2022-06-04 0741 Last data filed at 06/04/2022 0300 Gross per 24 hour  Intake 1892.58 ml  Output 635 ml  Net 1257.58 ml    Filed Weights   06/11/2022 1229  Weight: 88.4 kg   Examination:  General: Acute on chronically ill appearing male. Appears in mild respiratory distress.  Neuro: Alert and answering questions appropriately. Able to speak but speech difficult to comprehend. 2/5 strength of bilateral upper  extremities, 0/5 strength of bilateral lower extremities. Minimal sensation of the bilateral lower extremities, with L>R HEENT: Anterior neck dressing C/D/I with JP drain in place. Hard neck collar in place. Sclerae anicteric. Dry mucus membranes.  Cardiovascular: RRR, no M/R/G.  Lungs:  Coarse breathe sounds bilaterally. No wheezes or rales. Increased work of breathing with tachypnea noted. Pursed lip breathing.  Abdomen: BS x 4, soft, NT/ND.  Musculoskeletal: No gross deformities, no edema.  Skin: Intact, warm, no rashes.  Assessment & Plan:   # C4-C5 Discitis-Osteomyelitis c/b Epidural Abscess with spinal compression s/p C4 corpectomy, C3-C5 arthrodesis, C3-C5 anterior cervical plating # History of H. Influenza Meningitis ([place] 04/28/2022) [name] presented with acute weakness of all extremities with imaging consistent with discitis complicated by large phlegmon. Given recent history of H. Influenza meningitis, it is possible this is direct seeding. Additionally, imaging obtained 2 weeks ago with evidence of gas in the ventral canal. Gram stain with both GNR and GPC.  Cultures with NGTD @ 24 hours.   - Neurosurgery and ID following; appreciate their recommendations  - Continue broad spectrum antibiotics with Rocephin and Vancomycin  - Follow up surgical cultures - Post-op management per NSGY  # Acute on chronic hypoxic respiratory failure # History of COPD Group E Recently admitted for AHRF 2/2 CHF exacerbation and aspiration pneumonia. Prior to arrival, he was on 3L [place]. Extubated on 6/8. Given location of discitis/osteomyelitis, I am concerned patient is developing worsening diaphragmatic weakening. He is clear in his decision to be DNI and understands his respiratory status may worsened and potentially lead to death.   - Continue supplemental oxygen to maintain oxygen saturation above 88% - Continue NIF every 4 hours  - Start triple therapy bronchodilators; give weakness, will need nebulized treatments: Yupelri, Brovana and Pulmicort  - Duonebs PRN - Morphine PRN for air hunger   # History of CVA  # History of Vocal Cord Paralysis  Recently admitted for respiratory failure and found to have a left posterior frontotemporal infarct. Likely secondary to small vessel  disease. 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 Follows with Dr. Radford Pax at [facility] Metoprolol, Atorvastatin,  - Holding home Lasix and Amlodipine   # Type 2 Diabetes Mellitus  - CBG goal 140-180 - SSI q4h, moderate   # Chronic Urinary retention - Continue home foley  - Holding home Silodosin   # Acute on Chronic Normocytic Anemia Hemoglobin lower than baseline on admission. Will monitor closely. No indication for transfusion.   - Daily CBC while admitted   # Goals of Care Plan to continue full scope of care for now, but will add on Morphine for air hunger. Per patient, if he is to continue to decline, he would not like any resuscitative measures.   - DNR/DNI  RESOLVED:  # Hypotension - Sedation related  Best Practice (right click and "Reselect all SmartList Selections" daily)   Diet/type: tubefeeds DVT prophylaxis: Lovenox  GI prophylaxis: N/A Lines: N/A Foley:  Yes, and it is still needed Code Status:  DNR Last date of multidisciplinary goals of care discussion [06-23-2022]      Dr. Jose Persia Internal Medicine PGY-3  06-23-2022, 7:41 AM

## 2022-06-15 NOTE — Progress Notes (Signed)
RN bedside to assess Pt. Pt breathing labored and shallow at times. Morphine bolus given, Pt resting comfortably with family at bedside.

## 2022-06-15 NOTE — Discharge Summary (Signed)
DEATH SUMMARY   Patient Details  Name: [name] MRN: [id number] DOB: 01-Jun-1941  Admission/Discharge Information   Admit Date:  05/31/22  Date of Death: Date of Death: 2022-06-03  Time of Death: Time of Death: Mar 09, 1942  Length of Stay: 2  Referring Physician: Seward Carol, MD   Reason(s) for Hospitalization  Progressive paraparesis  Diagnoses  Preliminary cause of death: aspiration pneumonia.  Secondary Diagnoses (including complications and co-morbidities):  Principal Problem:   Abscess in epidural space of cervical spine Active Problems:   Acute on chronic respiratory failure with hypoxia (HCC)   Essential hypertension   COPD (chronic obstructive pulmonary disease) (HCC)   Type 2 diabetes mellitus without complication, without long-term current use of insulin (HCC)   Acute osteomyelitis of cervical spine (HCC)   Cervical discitis   Urinary retention   Protein-calorie malnutrition, severe   Pressure injury of skin   Brief Hospital Course (including significant findings, care, treatment, and services provided and events leading to death)  [name] is a 81 y.o. 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  Per family, pt developed rapidly worsening weakness in all extremities and cough over the last 1-2 days.  EMS was called and pt required non-rebreather mask en route to the ED.       ED work-up revealed finding progressive endplate erosions of the C4-5 endplates with prevertebral soft tissue swelling extending from C1-C5 indicating a discitis and osteomyelitis on CT scan.  Neurosurgery was consulted and family opted for aggressive treatment with of C4 corpectomy and reconstruction.    He was intubated and to be  transferred to ICU post-op.   Underwent  C4 corpectomy and reconstruction, left intubated and PCCM consulted, ID consulted. Gram stain from OR demonstrating GNR and GPC.   Next day patient was awake and passed SBT and was uncomfortable with tube.  Attempted extubation as best chance of recovery of swallowing function.   Began having progressive distress from aspiration, poor cough and diaphragmatic weakness.   Patient transitioned to comfort care as he had not wished to be reintubated.   Pertinent Labs and Studies  Significant Diagnostic Studies Portable Chest xray  Result Date: 05/23/2022 CLINICAL DATA:  Intubated EXAM: PORTABLE CHEST 1 VIEW COMPARISON:  05-31-2022 FINDINGS: Endotracheal tube 3.3 cm above the carina. NG tube extends below the hemidiaphragms into the stomach with the tip not visualized. Similar low lung volumes with vascular congestion and basilar atelectasis/consolidation. Difficult to exclude basilar pneumonia. No enlarging effusion or pneumothorax. Stable mild cardiomegaly. Previous coronary bypass changes noted. Aorta atherosclerotic. IMPRESSION: Support apparatus in good position. Similar low lung volumes with bibasilar atelectasis/consolidation. Electronically Signed   By: Jerilynn Mages.  Shick M.D.   On: 05/23/2022 08:16   DG Abd 1 View  Result Date: 05/16/2022 CLINICAL DATA:  Check gastric catheter placement EXAM: ABDOMEN - 1 VIEW COMPARISON:  None Available. FINDINGS: Gastric catheter is noted within the stomach. Gastrostomy catheter is noted in place as well. No obstructive changes or free air are seen. IMPRESSION: Gastric catheter within the stomach. Electronically Signed   By: Inez Catalina M.D.   On: 05/19/2022 21:25   DG Cervical Spine 2 or 3 views  Result Date: 06/10/2022 CLINICAL DATA:  Fluoroscopic  assistance for cervical disc fusion EXAM: CERVICAL SPINE - 2-3 VIEW COMPARISON:  MR cervical spine done on FINDINGS: Fluoroscopic images show interval anterior surgical fusion from  C3-C6 levels. Fluoroscopic time was 20 seconds. Radiation dose 2.42 mGy. IMPRESSION: Fluoroscopic assistance was provided for anterior surgical fusion from C3-C6 levels. Electronically Signed   By: Elmer Picker M.D.   On: 06/03/2022 17:35   DG C-Arm 1-60 Min-No Report  Result Date: 05/30/2022 Fluoroscopy was utilized by the requesting physician.  No radiographic interpretation.   DG C-Arm 1-60 Min-No Report  Result Date: 05/31/2022 Fluoroscopy was utilized by the requesting physician.  No radiographic interpretation.   DG C-Arm 1-60 Min-No Report  Result Date: 06/06/2022 Fluoroscopy was utilized by the requesting physician.  No radiographic interpretation.   MR Cervical Spine W or Wo Contrast  Result Date: 05/17/2022 CLINICAL DATA:  Quadriparesis EXAM: MRI CERVICAL SPINE WITHOUT AND WITH CONTRAST TECHNIQUE: Multiplanar and multiecho pulse sequences of the cervical spine, to include the craniocervical junction and cervicothoracic junction, were obtained without and with intravenous contrast. CONTRAST:  47m GADAVIST GADOBUTROL 1 MMOL/ML IV SOLN COMPARISON:  CT cervical spine 05/28/2022 FINDINGS: Alignment: Normal Vertebrae: There is abnormal T1 and T2-weighted signal within the C4 and C5 vertebral bodies with endplate abnormalities characterized on the earlier CT, consistent with discitis-osteomyelitis. Cord: There is hyperintense T2-weighted signal throughout much of the cervical spinal cord. Spinal cord is compressed at the C3-5 levels by a large ventral epidural abscess that measures 3.4 cm in craniocaudal dimension and 0.7 cm in thickness. Posterior Fossa, vertebral arteries, paraspinal tissues: There is a prevertebral phlegmon/abscess measuring up to 13 mm in thickness. Disc levels: Aside from the above described, the disc levels are unremarkable. IMPRESSION: 1. 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 2. Prevertebral  phlegmon/abscess measuring up to 13 mm in thickness. Critical Value/emergent results were called by telephone at the time of interpretation on 05/29/2022 at 3:49 am to provider EQuintella Reichert who verbally acknowledged these results. Electronically Signed   By: KUlyses JarredM.D.   On: 05/25/2022 03:50   CT Head Wo Contrast  Result Date: 05/20/2022 CLINICAL DATA:  Neck pain with bilateral upper and lower extremity weakness EXAM: CT HEAD WITHOUT CONTRAST CT CERVICAL SPINE WITHOUT CONTRAST TECHNIQUE: Multidetector CT imaging of the head and cervical spine was performed following the standard protocol without intravenous contrast. Multiplanar CT image reconstructions of the cervical spine were also generated. RADIATION DOSE REDUCTION: This exam was performed according to the departmental dose-optimization program which includes automated exposure control, adjustment of the mA and/or kV according to patient size and/or use of iterative reconstruction technique. COMPARISON:  CTA head neck 04/28/2022 FINDINGS: CT HEAD FINDINGS Brain: There is no mass, hemorrhage or extra-axial collection. The size and configuration of the ventricles and extra-axial CSF spaces are normal. There is hypoattenuation of the periventricular white matter, most commonly indicating chronic ischemic microangiopathy. Vascular: No abnormal hyperdensity of the major intracranial arteries or dural venous sinuses. No intracranial atherosclerosis. Skull: The visualized skull base, calvarium and extracranial soft tissues are normal. Sinuses/Orbits: No fluid levels or advanced mucosal thickening of the visualized paranasal sinuses. No mastoid or middle ear effusion. The orbits are normal. CT CERVICAL SPINE FINDINGS Alignment: No static subluxation. Facets are aligned. Occipital condyles are normally positioned. Skull base and vertebrae: Previously seen gas dorsal to the C4-5 disc spaces resolved. There is progressive endplate erosion of the opposing C4-5  endplates. Soft tissues and spinal  canal: There is prevertebral soft tissue swelling extending from C1-C5. Disc levels: As above, there are endplate irregularities at C4-5. Upper chest: No pneumothorax, pulmonary nodule or pleural effusion. Other: Normal visualized paraspinal cervical soft tissues. IMPRESSION: 1. No acute intracranial abnormality. 2. Progressive endplate erosion of the opposing C4-5 endplates with prevertebral soft tissue swelling extending from C1-C5. 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 Given the upper and lower extremity weakness, an MRI of the cervical spine with and without contrast should be obtained to assess for a possible epidural abscess causing cord compression. 3. Prevertebral soft tissue swelling at the C2-5 levels is likely reactive. No discrete retropharyngeal abscess is visible. Electronically Signed   By: Ulyses Jarred M.D.   On: 06/05/2022 01:25   CT Cervical Spine Wo Contrast  Result Date: 05/19/2022 CLINICAL DATA:  Neck pain with bilateral upper and lower extremity weakness EXAM: CT HEAD WITHOUT CONTRAST CT CERVICAL SPINE WITHOUT CONTRAST TECHNIQUE: Multidetector CT imaging of the head and cervical spine was performed following the standard protocol without intravenous contrast. Multiplanar CT image reconstructions of the cervical spine were also generated. RADIATION DOSE REDUCTION: This exam was performed according to the departmental dose-optimization program which includes automated exposure control, adjustment of the mA and/or kV according to patient size and/or use of iterative reconstruction technique. COMPARISON:  CTA head neck 04/28/2022 FINDINGS: CT HEAD FINDINGS Brain: There is no mass, hemorrhage or extra-axial collection. The size and configuration of the ventricles and extra-axial CSF spaces are normal. There is hypoattenuation of the periventricular white matter, most commonly indicating chronic ischemic microangiopathy. Vascular: No abnormal  hyperdensity of the major intracranial arteries or dural venous sinuses. No intracranial atherosclerosis. Skull: The visualized skull base, calvarium and extracranial soft tissues are normal. Sinuses/Orbits: No fluid levels or advanced mucosal thickening of the visualized paranasal sinuses. No mastoid or middle ear effusion. The orbits are normal. CT CERVICAL SPINE FINDINGS Alignment: No static subluxation. Facets are aligned. Occipital condyles are normally positioned. Skull base and vertebrae: Previously seen gas dorsal to the C4-5 disc spaces resolved. There is progressive endplate erosion of the opposing C4-5 endplates. Soft tissues and spinal canal: There is prevertebral soft tissue swelling extending from C1-C5. Disc levels: As above, there are endplate irregularities at C4-5. Upper chest: No pneumothorax, pulmonary nodule or pleural effusion. Other: Normal visualized paraspinal cervical soft tissues. IMPRESSION: 1. No acute intracranial abnormality. 2. Progressive endplate erosion of the opposing C4-5 endplates with prevertebral soft tissue swelling extending from C1-C5. 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 Given the upper and lower extremity weakness, an MRI of the cervical spine with and without contrast should be obtained to assess for a possible epidural abscess causing cord compression. 3. Prevertebral soft tissue swelling at the C2-5 levels is likely reactive. No discrete retropharyngeal abscess is visible. Electronically Signed   By: Ulyses Jarred M.D.   On: 05/17/2022 01:25   DG Chest Portable 1 View  Result Date: 05/23/2022 CLINICAL DATA:  Dyspnea. EXAM: PORTABLE CHEST 1 VIEW COMPARISON:  Radiograph 04/29/2022, CT 04/27/2022 FINDINGS: Prior median sternotomy and CABG. Lung volumes are low. Heart size difficult to assess but upper normal in size. Aortic atherosclerosis, stable mediastinal contours. Increased interstitial markings are again seen, slight improvement on the left from prior.  There may be a small right pleural effusion. No confluent consolidation or pneumothorax. IMPRESSION: 1. Low lung volumes. Increased interstitial opacities with slight improvement on the left from prior exam, may be chronic or represent pulmonary edema. 2. Possible small right pleural effusion. Electronically Signed  By: Keith Rake M.D.   On: 05/18/2022 23:04   IR GASTROSTOMY TUBE MOD SED  Result Date: 05/07/2022 INDICATION: Acute on chronic respiratory failure, hypoxia, dysphagia EXAM: TWENTY FRENCH FLUOROSCOPIC 24 PULL-THROUGH GASTROSTOMY Date:  05/07/2022 05/07/2022 12:53 pm Radiologist:  Jerilynn Mages. Daryll Brod, MD Guidance:  Fluoroscopic MEDICATIONS: Ancef 2 g; Antibiotics were administered within 1 hour of the procedure. Glucagon 0.5 mg IV ANESTHESIA/SEDATION: Versed 1.0 mg IV; Fentanyl 50 mcg IV Moderate Sedation Time:  15 minutes The patient was continuously monitored during the procedure by the interventional radiology nurse under my direct supervision. CONTRAST:  10 cc-administered into the gastric lumen. FLUOROSCOPY: Fluoroscopy Time: 2 minutes 48 seconds (115 mGy). COMPLICATIONS: None immediate. PROCEDURE: Informed consent was obtained from the patient following explanation of the procedure, risks, benefits and alternatives. The patient understands, agrees and consents for the procedure. All questions were addressed. A time out was performed. Maximal barrier sterile technique utilized including caps, mask, sterile gowns, sterile gloves, large sterile drape, hand hygiene, and betadine prep. The left upper quadrant was sterilely prepped and draped. An oral gastric catheter was inserted into the stomach under fluoroscopy. The existing nasogastric feeding tube was removed. Air was injected into the stomach for insufflation and visualization under fluoroscopy. The air distended stomach was confirmed beneath the anterior abdominal wall in the frontal and lateral projections. Under sterile conditions and local  anesthesia, a 52 gauge trocar needle was utilized to access the stomach percutaneously beneath the left subcostal margin. Needle position was confirmed within the stomach under biplane fluoroscopy. Contrast injection confirmed position also. A single T tack was deployed for gastropexy. Over an Amplatz guide wire, a 9-French sheath was inserted into the stomach. A snare device was utilized to capture the oral gastric catheter. The snare device was pulled retrograde from the stomach up the esophagus and out the oropharynx. The 20-French pull-through gastrostomy was connected to the snare device and pulled antegrade through the oropharynx down the esophagus into the stomach and then through the percutaneous tract external to the patient. The gastrostomy was assembled externally. Contrast injection confirms position in the stomach. Images were obtained for documentation. The patient tolerated procedure well. No immediate complication. IMPRESSION: Fluoroscopic insertion of a 20-French "pull-through" gastrostomy. Electronically Signed   By: Jerilynn Mages.  Shick M.D.   On: 05/07/2022 13:09   DG Abd 1 View  Result Date: 05/07/2022 CLINICAL DATA:  Evaluation of distribution of oral contrast prior to possible gastrostomy tube placement. EXAM: ABDOMEN - 1 VIEW COMPARISON:  05/03/2022 FINDINGS: Feeding tube continues to extend into the proximal duodenum. Contrast is present in the colon as well as air outlining the transverse colon, splenic flexure, descending colon, sigmoid colon and rectum. The colon is not dilated. There is no evidence of bowel obstruction or significant ileus. In the projection of the x-ray, the transverse colon overlies the course of the feeding tube within the stomach. IMPRESSION: Contrast within the colon. No evidence of bowel obstruction or significant ileus. The transverse colon overlies the course of the feeding tube in the stomach in the projection of the x-ray. Electronically Signed   By: Aletta Edouard  M.D.   On: 05/07/2022 09:09    Microbiology No results found for this or any previous visit (from the past 240 hour(s)).  Lab Basic Metabolic Panel: No results for input(s): "NA", "K", "CL", "CO2", "GLUCOSE", "BUN", "CREATININE", "CALCIUM", "MG", "PHOS" in the last 168 hours. Liver Function Tests: No results for input(s): "AST", "ALT", "ALKPHOS", "BILITOT", "PROT", "ALBUMIN" in the  last 168 hours. No results for input(s): "LIPASE", "AMYLASE" in the last 168 hours. No results for input(s): "AMMONIA" in the last 168 hours. CBC: No results for input(s): "WBC", "NEUTROABS", "HGB", "HCT", "MCV", "PLT" in the last 168 hours. Cardiac Enzymes: No results for input(s): "CKTOTAL", "CKMB", "CKMBINDEX", "TROPONINI" in the last 168 hours. Sepsis Labs: No results for input(s): "PROCALCITON", "WBC", "LATICACIDVEN" in the last 168 hours.  Procedures/Operations  C4 corpectomy, mechanical ventilation.    Gurveer Colucci 06/03/2022, 12:18 PM

## 2022-06-15 NOTE — Progress Notes (Signed)
Date and time results received: 06-15-22 0748   Test: Vanc trough  Critical Value: 40  Name of Provider Notified: Dr Charleen Kirks   Orders Received? Or Actions Taken?: no new orders at this time

## 2022-06-15 NOTE — Anesthesia Postprocedure Evaluation (Signed)
Anesthesia Post Note  Patient: SHUBH CHIARA  Procedure(s) Performed: ANTERIOR CERVICAL FOUR CORPECTOMY WITH ANTERIOR CERVICAL THREE TO FIVE DECOMPRESSION/DISCECTOMY FUSION 2 LEVELS     Patient location during evaluation: ICU Anesthesia Type: General Level of consciousness: sedated, patient cooperative and oriented Pain management: pain level controlled Vital Signs Assessment: post-procedure vital signs reviewed and stable Respiratory status: spontaneous breathing and patient connected to nasal cannula oxygen (pt extubated POD #1, but has become increasingly dyspneic today) Cardiovascular status: stable Postop Assessment: no apparent nausea or vomiting Anesthetic complications: no Comments: Pt with initial improvement post op, yet is now progressively more dyspneic. He and family have transitioned him to comfort care, as he wishes to not be intubated/ventilated.    No notable events documented.  Last Vitals:  Vitals:   Jun 14, 2022 1600 06-14-22 1700  BP: (!) 106/47 (!) 99/47  Pulse: (!) 123 (!) 124  Resp: 18 16  Temp:    SpO2: (!) 69% (!) 65%    Last Pain:  Vitals:   06/14/22 1200  TempSrc: Axillary  PainSc:                  Bela Nyborg,E. Hayley Horn

## 2022-06-15 NOTE — Progress Notes (Signed)
NIF= -10 best effort out of 3 attempts.

## 2022-06-15 DEATH — deceased

## 2023-05-08 IMAGING — RF DG CERVICAL SPINE 2 OR 3 VIEWS
1 series · 2 of 2 positions shown · non-contrast
Comparison: MR cervical spine done on

CLINICAL DATA: Fluoroscopic assistance for cervical disc fusion

EXAM:
CERVICAL SPINE - 2-3 VIEW

[Series 1: run · 2 of 2 slices shown]
[im 1/2]
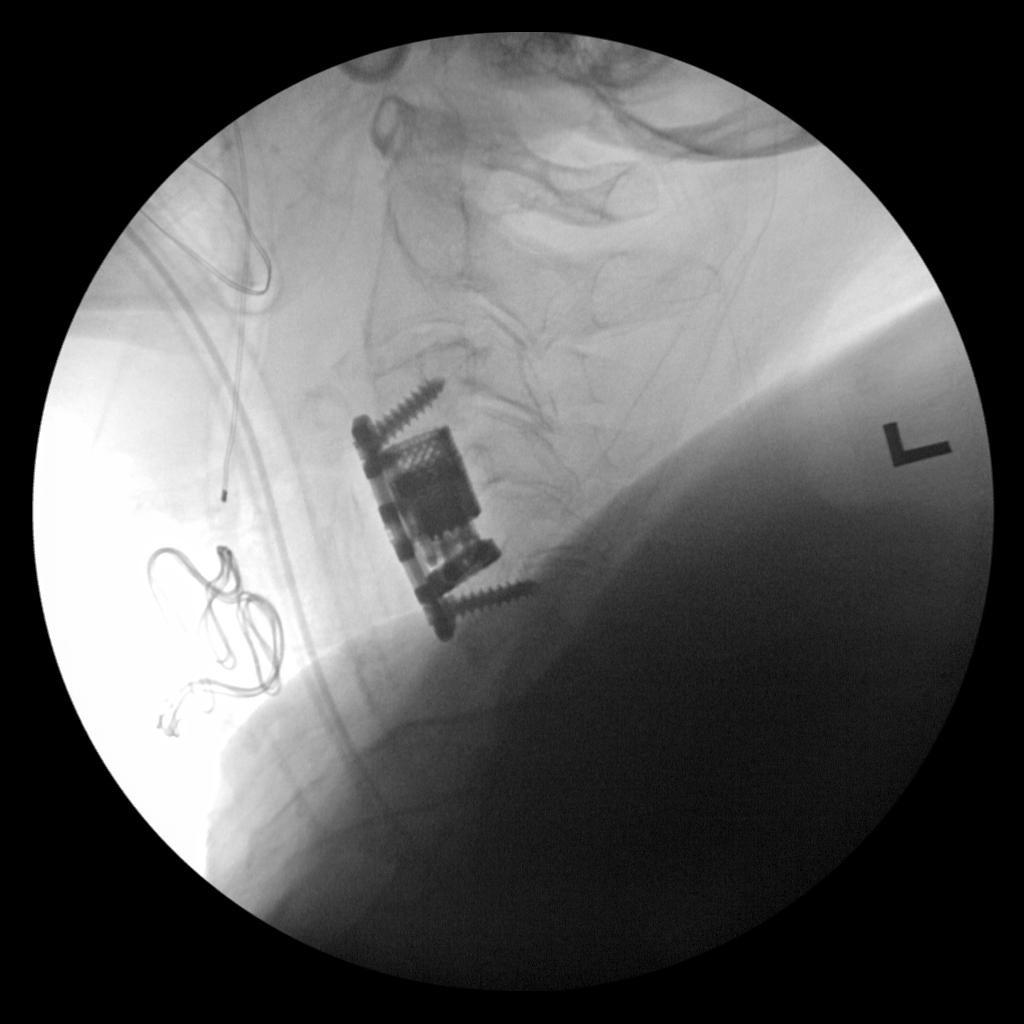
[im 2/2]
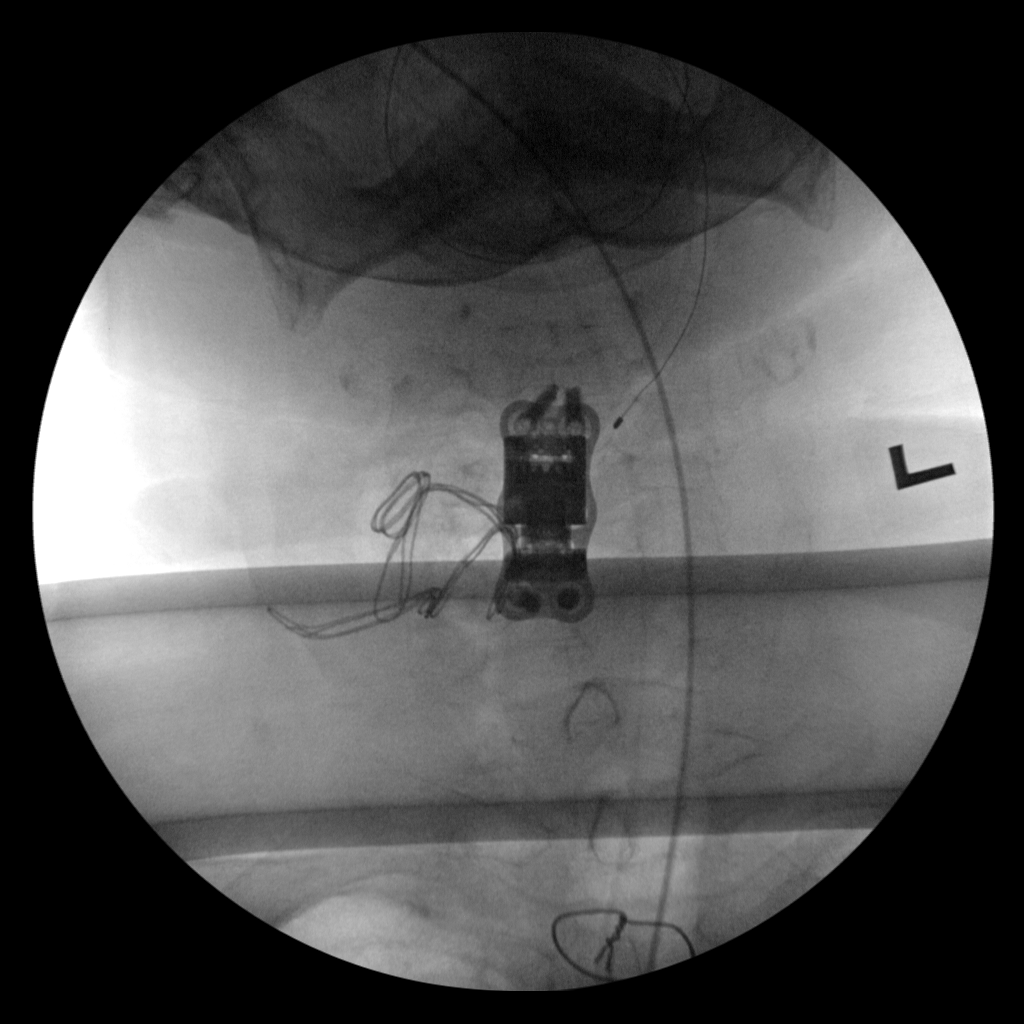

[2 of 2 positions shown; findings below may reference images not displayed]

FINDINGS: Fluoroscopic images show interval anterior surgical fusion from
C3-C6 levels. Fluoroscopic time was 20 seconds. Radiation dose
mGy.
IMPRESSION: Fluoroscopic assistance was provided for anterior surgical fusion
from C3-C6 levels.

## 2023-05-08 IMAGING — MR MR CERVICAL SPINE WO/W CM
4 of 8 series · 19 of 48 positions shown · IV contrast (cc GAD)
Comparison: CT cervical spine 05/22/2022

CLINICAL DATA: Quadriparesis

EXAM:
MRI CERVICAL SPINE WITHOUT AND WITH CONTRAST
TECHNIQUE: Multiplanar and multiecho pulse sequences of the cervical spine, to
include the craniocervical junction and cervicothoracic junction,
were obtained without and with intravenous contrast.
CONTRAST:  8mL GADAVIST GADOBUTROL 1 MMOL/ML IV SOLN

[Series 2: T2 · sagittal · 3.0mm · 0.43mm/px · 4 of 21 slices shown (1 of 2)]
[im 1/21]
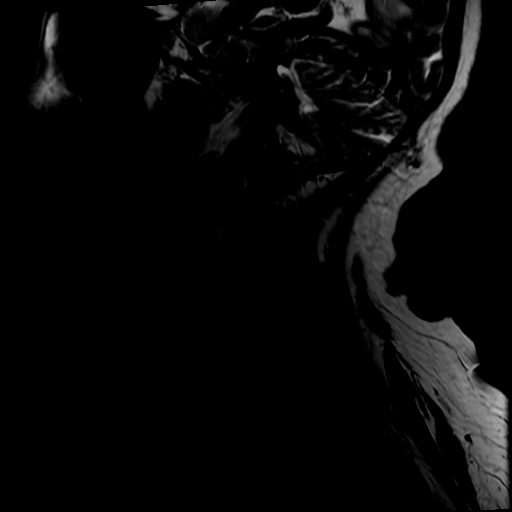
[im 7/21]
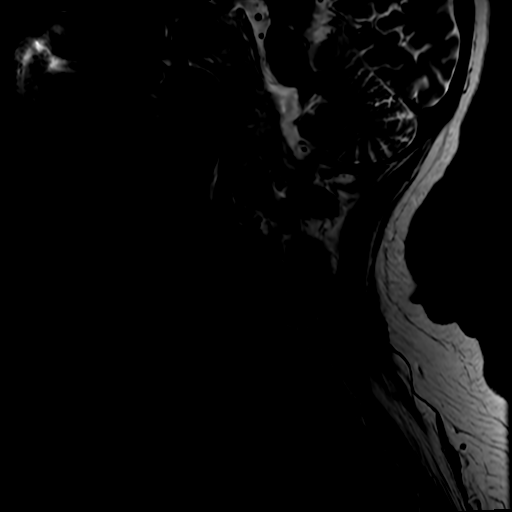
[im 14/21]
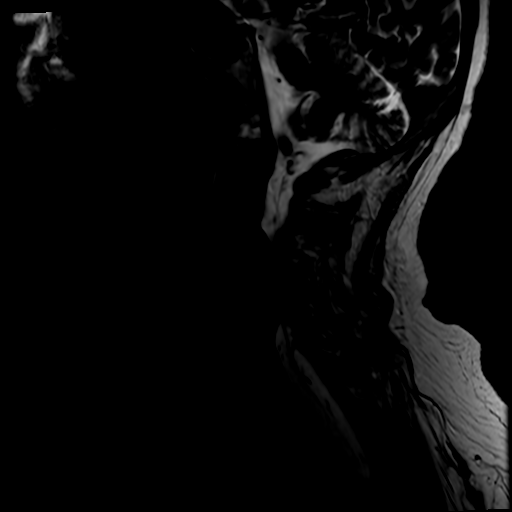
[im 21/21]
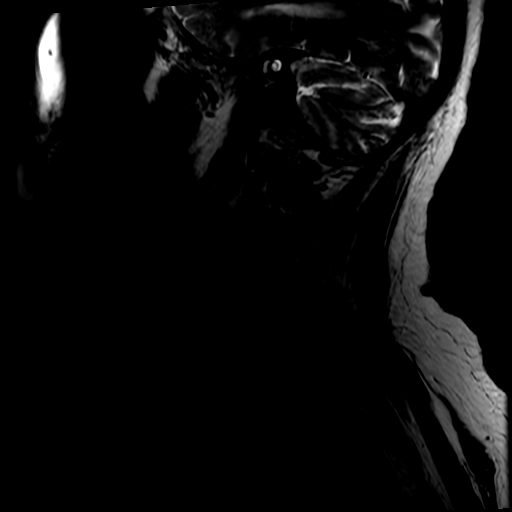

[Series 5: T2 · oblique · 3.0mm · 0.35mm/px · 6 of 39 slices shown (2 of 2)]
[im 1/39]
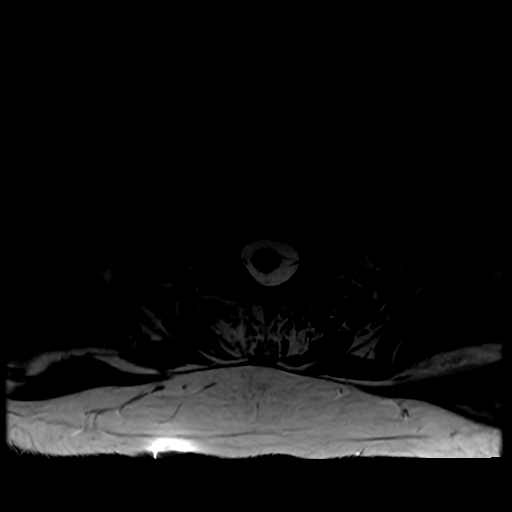
[im 8/39]
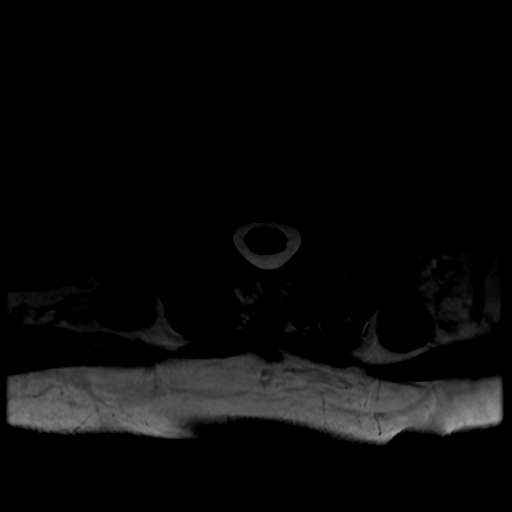
[im 16/39]
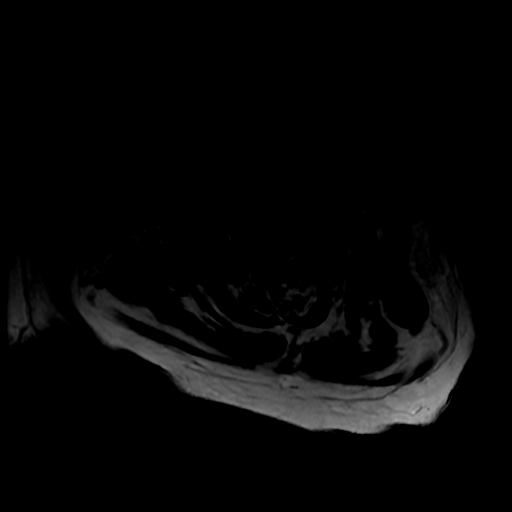
[im 23/39]
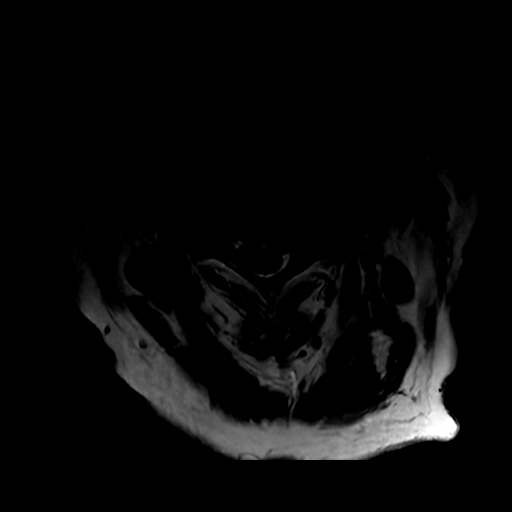
[im 31/39]
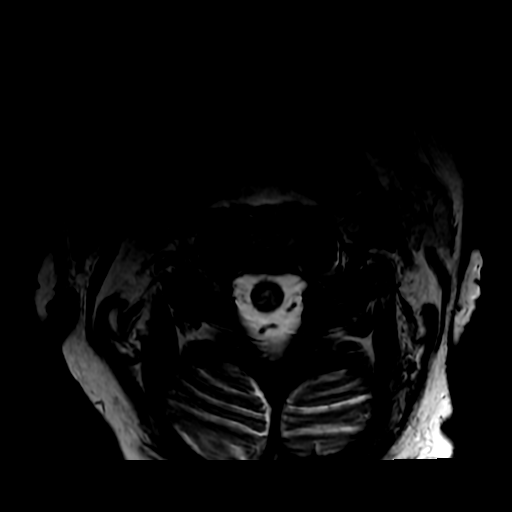
[im 39/39]
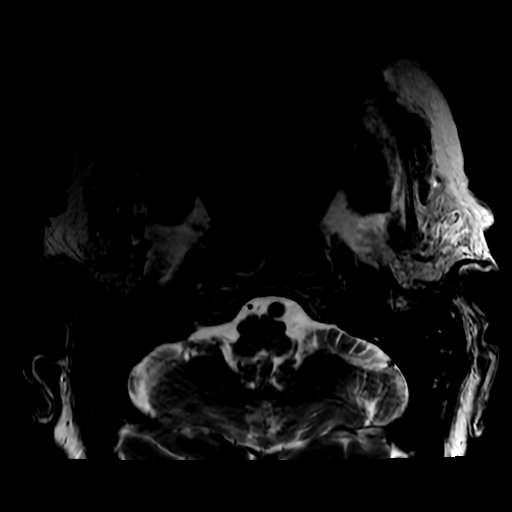

[Series 6: T1 · oblique · non-contrast · 3.0mm · 0.35mm/px · 6 of 39 slices shown]
[im 1/39]
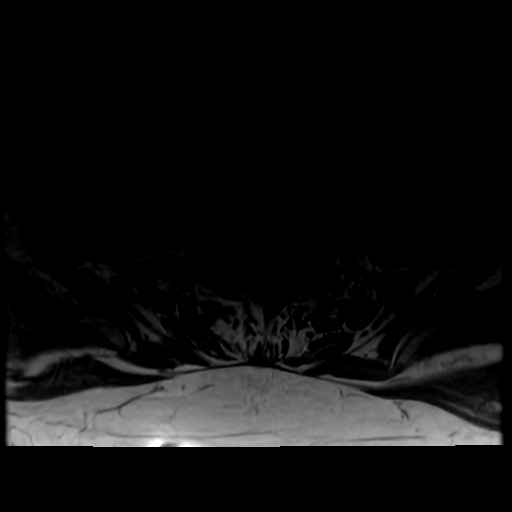
[im 8/39]
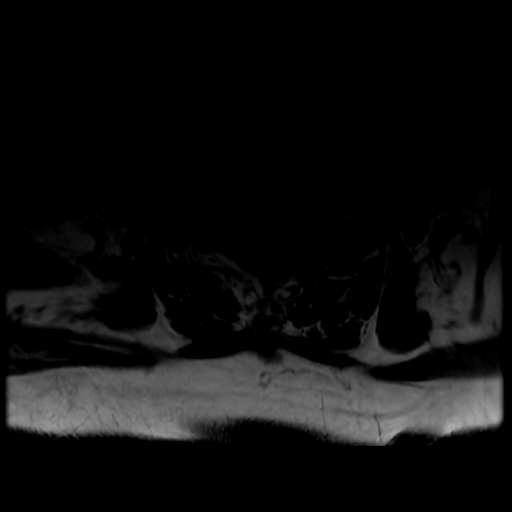
[im 16/39]
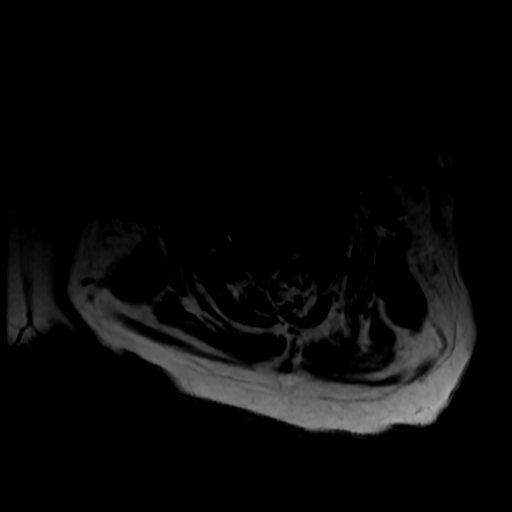
[im 23/39]
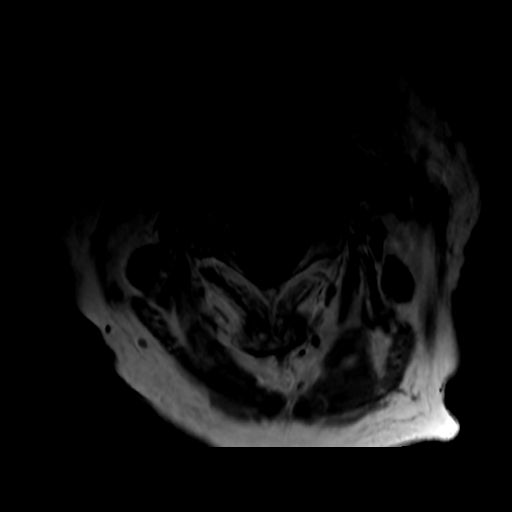
[im 31/39]
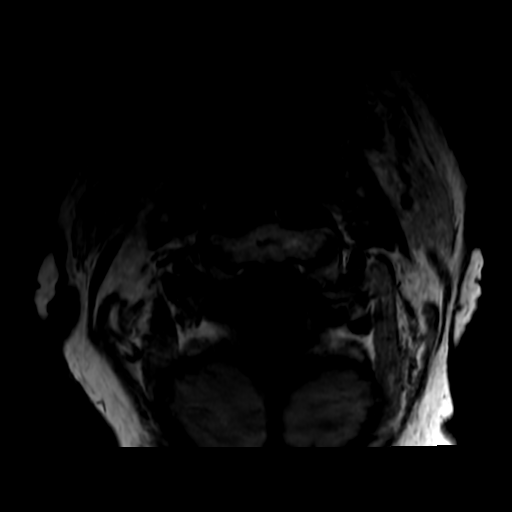
[im 39/39]
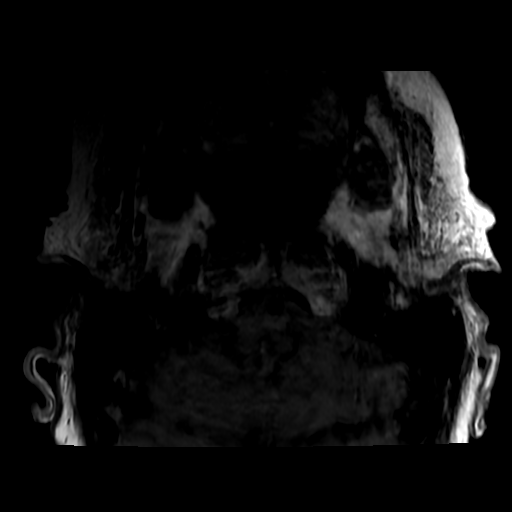

[Series 8: T1 fat-sat post-contrast · sagittal · 3.0mm · 0.43mm/px · 3 of 21 slices shown]
[im 1/21]
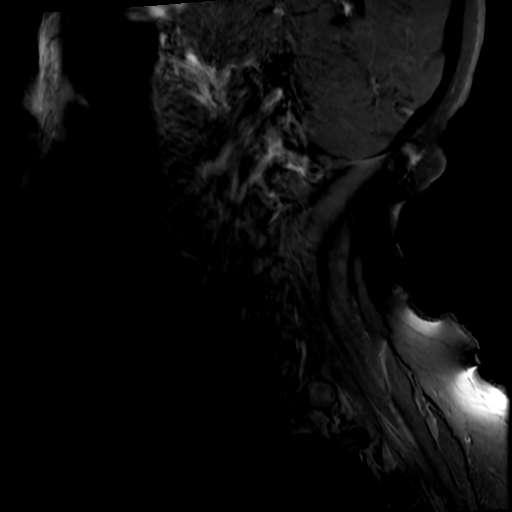
[im 11/21]
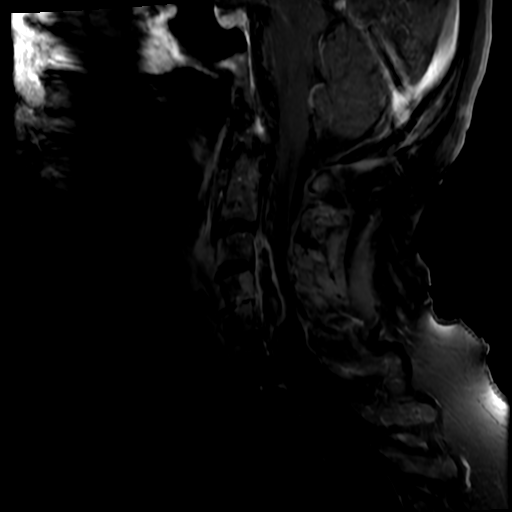
[im 21/21]
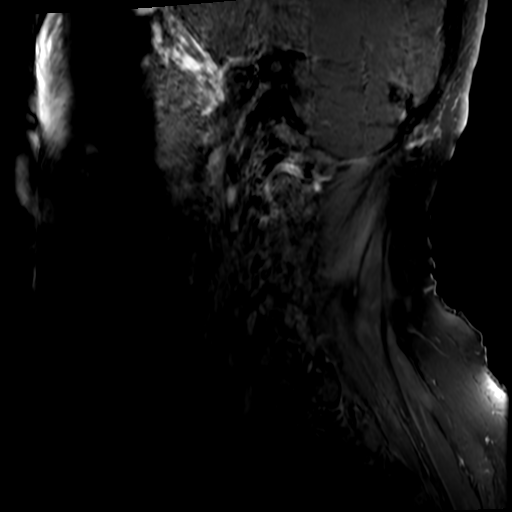

[19 of 48 positions shown; findings below may reference images not displayed]

FINDINGS: Alignment: Normal

Vertebrae: There is abnormal T1 and T2-weighted signal within the C4
and C5 vertebral bodies with endplate abnormalities characterized on
the earlier CT, consistent with discitis-osteomyelitis.

Cord: There is hyperintense T2-weighted signal throughout much of
the cervical spinal cord. Spinal cord is compressed at the C3-5
levels by a large ventral epidural abscess that measures 3.4 cm in
craniocaudal dimension and 0.7 cm in thickness.

Posterior Fossa, vertebral arteries, paraspinal tissues: There is a
prevertebral phlegmon/abscess measuring up to 13 mm in thickness.

Disc levels:

Aside from the above described, the disc levels are unremarkable.
IMPRESSION: 1. Discitis-osteomyelitis at the C4-C5 level with ventral epidural
abscess causing compression of the spinal cord.
2. Prevertebral phlegmon/abscess measuring up to 13 mm in thickness.

Critical Value/emergent results were called by telephone at the time
of interpretation on 05/22/2022 at [DATE] to provider Ashe Bolden,
who verbally acknowledged these results.

## 2023-05-09 IMAGING — CR DG CHEST 1V PORT
1 series · 1 of 1 positions shown · non-contrast
Comparison: 05/21/2022

CLINICAL DATA: Intubated

EXAM:
PORTABLE CHEST 1 VIEW

[AP]
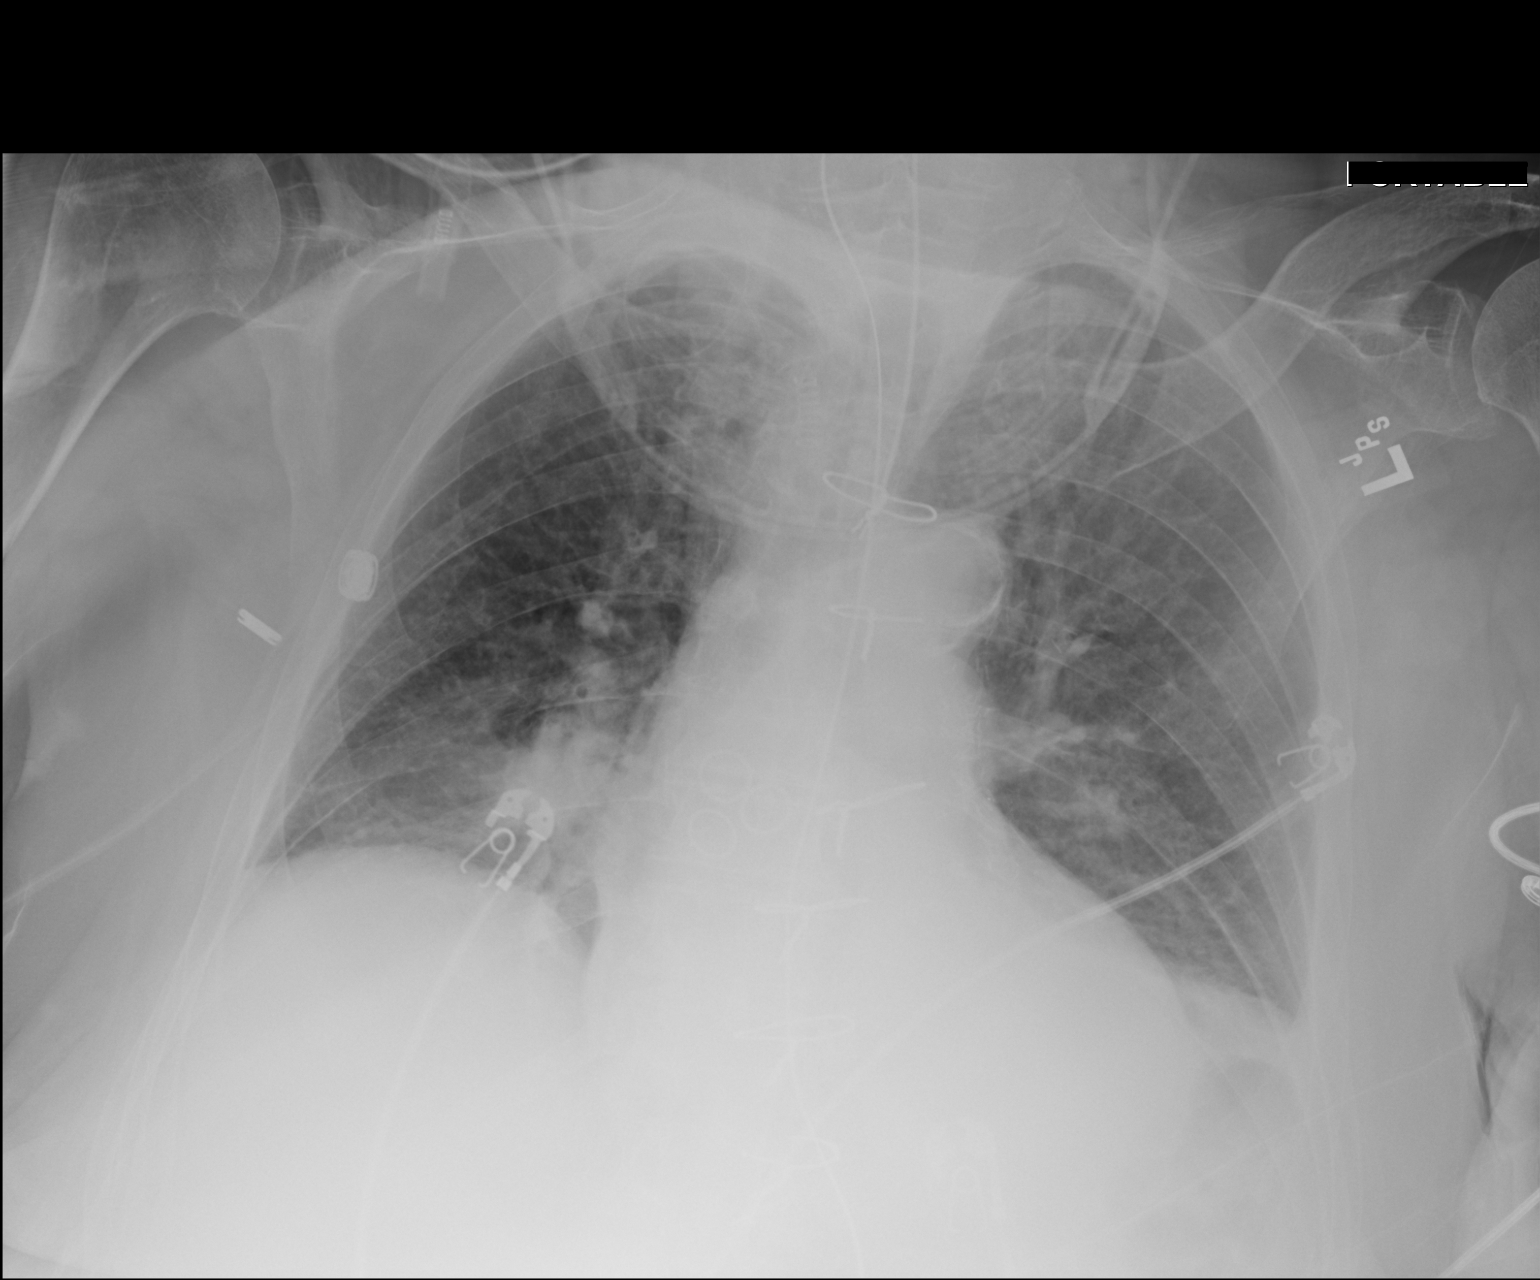

[1 of 1 positions shown; findings below may reference images not displayed]

FINDINGS: Endotracheal tube 3.3 cm above the carina. NG tube extends below the
hemidiaphragms into the stomach with the tip not visualized. Similar
low lung volumes with vascular congestion and basilar
atelectasis/consolidation. Difficult to exclude basilar pneumonia.
No enlarging effusion or pneumothorax. Stable mild cardiomegaly.
Previous coronary bypass changes noted. Aorta atherosclerotic.
IMPRESSION: Support apparatus in good position.

Similar low lung volumes with bibasilar atelectasis/consolidation.
